# Patient Record
Sex: Female | Born: 1945 | Race: White | Hispanic: No | Smoking: Never smoker
Health system: Southern US, Community
[De-identification: ages and names within clinical notes are randomized; demographics above are authoritative.]

## PROBLEM LIST (undated history)

## (undated) DIAGNOSIS — Z923 Personal history of irradiation: Secondary | ICD-10-CM

## (undated) DIAGNOSIS — Z9221 Personal history of antineoplastic chemotherapy: Secondary | ICD-10-CM

## (undated) DIAGNOSIS — K449 Diaphragmatic hernia without obstruction or gangrene: Secondary | ICD-10-CM

## (undated) DIAGNOSIS — E119 Type 2 diabetes mellitus without complications: Secondary | ICD-10-CM

## (undated) DIAGNOSIS — K635 Polyp of colon: Secondary | ICD-10-CM

## (undated) DIAGNOSIS — M1990 Unspecified osteoarthritis, unspecified site: Secondary | ICD-10-CM

## (undated) DIAGNOSIS — G629 Polyneuropathy, unspecified: Secondary | ICD-10-CM

## (undated) DIAGNOSIS — I10 Essential (primary) hypertension: Secondary | ICD-10-CM

## (undated) DIAGNOSIS — R0600 Dyspnea, unspecified: Secondary | ICD-10-CM

## (undated) DIAGNOSIS — C50412 Malignant neoplasm of upper-outer quadrant of left female breast: Principal | ICD-10-CM

## (undated) DIAGNOSIS — Z8709 Personal history of other diseases of the respiratory system: Secondary | ICD-10-CM

## (undated) DIAGNOSIS — K279 Peptic ulcer, site unspecified, unspecified as acute or chronic, without hemorrhage or perforation: Secondary | ICD-10-CM

## (undated) DIAGNOSIS — K219 Gastro-esophageal reflux disease without esophagitis: Secondary | ICD-10-CM

## (undated) HISTORY — DX: Peptic ulcer, site unspecified, unspecified as acute or chronic, without hemorrhage or perforation: K27.9

## (undated) HISTORY — DX: Polyneuropathy, unspecified: G62.9

## (undated) HISTORY — DX: Essential (primary) hypertension: I10

## (undated) HISTORY — DX: Polyp of colon: K63.5

## (undated) HISTORY — PX: TUBAL LIGATION: SHX77

## (undated) HISTORY — DX: Type 2 diabetes mellitus without complications: E11.9

## (undated) HISTORY — DX: Malignant neoplasm of upper-outer quadrant of left female breast: C50.412

## (undated) HISTORY — DX: Diaphragmatic hernia without obstruction or gangrene: K44.9

## (undated) HISTORY — PX: FOOT SURGERY: SHX648

## (undated) HISTORY — PX: COLONOSCOPY: SHX174

---

## 1972-03-09 HISTORY — PX: APPENDECTOMY: SHX54

## 1993-03-09 HISTORY — PX: VAGINAL HYSTERECTOMY: SUR661

## 1994-03-09 HISTORY — PX: CHOLECYSTECTOMY: SHX55

## 1997-07-27 ENCOUNTER — Ambulatory Visit (HOSPITAL_COMMUNITY): Admission: RE | Admit: 1997-07-27 | Discharge: 1997-07-27 | Payer: Self-pay | Admitting: Gastroenterology

## 1997-08-28 ENCOUNTER — Other Ambulatory Visit: Admission: RE | Admit: 1997-08-28 | Discharge: 1997-08-28 | Payer: Self-pay | Admitting: Obstetrics & Gynecology

## 1998-03-09 HISTORY — PX: OTHER SURGICAL HISTORY: SHX169

## 1998-09-24 ENCOUNTER — Other Ambulatory Visit: Admission: RE | Admit: 1998-09-24 | Discharge: 1998-09-24 | Payer: Self-pay | Admitting: Obstetrics & Gynecology

## 1999-08-12 ENCOUNTER — Other Ambulatory Visit: Admission: RE | Admit: 1999-08-12 | Discharge: 1999-08-12 | Payer: Self-pay | Admitting: Podiatry

## 1999-12-02 ENCOUNTER — Other Ambulatory Visit: Admission: RE | Admit: 1999-12-02 | Discharge: 1999-12-02 | Payer: Self-pay | Admitting: Obstetrics & Gynecology

## 2000-02-10 ENCOUNTER — Encounter: Payer: Self-pay | Admitting: Neurology

## 2000-02-10 ENCOUNTER — Ambulatory Visit (HOSPITAL_COMMUNITY): Admission: RE | Admit: 2000-02-10 | Discharge: 2000-02-10 | Payer: Self-pay | Admitting: Neurology

## 2001-01-10 ENCOUNTER — Other Ambulatory Visit: Admission: RE | Admit: 2001-01-10 | Discharge: 2001-01-10 | Payer: Self-pay | Admitting: Obstetrics & Gynecology

## 2002-01-26 ENCOUNTER — Other Ambulatory Visit: Admission: RE | Admit: 2002-01-26 | Discharge: 2002-01-26 | Payer: Self-pay | Admitting: Obstetrics & Gynecology

## 2002-12-18 ENCOUNTER — Ambulatory Visit (HOSPITAL_COMMUNITY): Admission: RE | Admit: 2002-12-18 | Discharge: 2002-12-18 | Payer: Self-pay | Admitting: Gastroenterology

## 2002-12-18 ENCOUNTER — Encounter (INDEPENDENT_AMBULATORY_CARE_PROVIDER_SITE_OTHER): Payer: Self-pay | Admitting: *Deleted

## 2003-04-02 ENCOUNTER — Other Ambulatory Visit: Admission: RE | Admit: 2003-04-02 | Discharge: 2003-04-02 | Payer: Self-pay | Admitting: Obstetrics & Gynecology

## 2004-04-18 ENCOUNTER — Other Ambulatory Visit: Admission: RE | Admit: 2004-04-18 | Discharge: 2004-04-18 | Payer: Self-pay | Admitting: Obstetrics & Gynecology

## 2005-05-12 ENCOUNTER — Other Ambulatory Visit: Admission: RE | Admit: 2005-05-12 | Discharge: 2005-05-12 | Payer: Self-pay | Admitting: Obstetrics & Gynecology

## 2007-04-14 ENCOUNTER — Ambulatory Visit (HOSPITAL_COMMUNITY): Admission: RE | Admit: 2007-04-14 | Discharge: 2007-04-14 | Payer: Self-pay | Admitting: Family Medicine

## 2007-04-15 ENCOUNTER — Encounter (INDEPENDENT_AMBULATORY_CARE_PROVIDER_SITE_OTHER): Payer: Self-pay | Admitting: Family Medicine

## 2007-04-15 ENCOUNTER — Ambulatory Visit (HOSPITAL_COMMUNITY): Admission: RE | Admit: 2007-04-15 | Discharge: 2007-04-15 | Payer: Self-pay | Admitting: Family Medicine

## 2009-02-27 ENCOUNTER — Encounter: Admission: RE | Admit: 2009-02-27 | Discharge: 2009-02-27 | Payer: Self-pay | Admitting: Obstetrics & Gynecology

## 2010-07-25 NOTE — Op Note (Signed)
   NAME:  Kristine Parrish, BISCEGLIA                         ACCOUNT NO.:  [id number]   MEDICAL RECORD NO.:  [id number]                   PATIENT TYPE:  AMB   LOCATION:  ENDO                                 FACILITY:  [facility]   PHYSICIAN:  James L. Rolla Flatten., M.D.          DATE OF BIRTH:  1945/10/30   DATE OF PROCEDURE:  12/18/2002  DATE OF DISCHARGE:                                 OPERATIVE REPORT   PROCEDURE PERFORMED:  Colonoscopy and coagulation of polyps.   ENDOSCOPIST:  Joyice Faster. Edwards, M.D.   MEDICATIONS:  Fentanyl 100 mcg, Versed 2 mg IV.   INSTRUMENT USED:  Pediatric adjustable colonoscope.   INDICATIONS FOR PROCEDURE:  Previous history of adenomatous polyps.  This is  done as a follow-up colonoscopy.   DESCRIPTION OF PROCEDURE:  The procedure had been explained to the patient  and consent obtained.  With the patient in the left lateral decubitus  position, the pediatric adjustable scope was inserted and advanced.  The  prep was excellent and we were able to advance to the cecum without  difficulty.  The ileocecal valve and appendiceal orifice were seen.  The  scope was withdrawn and the cecum, ascending colon, transverse colon,  descending were seen well and no polyps were seen.  In the sigmoid colon at  35 cm up from the anal verge, a 2 to 3 mm sessile polyp was cauterized.  In  a similar fashion, a 2 to 3 mm rectal polyp was cauterized.  These were  placed in different jars.  No other polyps were seen.  The scope was  withdrawn.  The patient tolerated the procedure well.   ASSESSMENT:  Polyps removed from descending and sigmoid colon.   PLAN:  Routine post polypectomy instructions.  Will recommend repeating in  three years.                                               James L. Rolla Flatten., M.D.    [name]  D:  12/18/2002  T:  12/18/2002  Job:  [id number]   cc:   Orpah Melter, M.D.  [address] [place]  [place] [address]  Fax: [phone]

## 2011-05-28 ENCOUNTER — Other Ambulatory Visit: Payer: Self-pay | Admitting: Gastroenterology

## 2012-04-09 HISTORY — PX: OTHER SURGICAL HISTORY: SHX169

## 2013-03-15 ENCOUNTER — Encounter: Payer: Self-pay | Admitting: Neurology

## 2013-03-16 ENCOUNTER — Ambulatory Visit (INDEPENDENT_AMBULATORY_CARE_PROVIDER_SITE_OTHER): Payer: BC Managed Care – PPO | Admitting: Neurology

## 2013-03-16 ENCOUNTER — Encounter (INDEPENDENT_AMBULATORY_CARE_PROVIDER_SITE_OTHER): Payer: Self-pay

## 2013-03-16 ENCOUNTER — Encounter: Payer: Self-pay | Admitting: Neurology

## 2013-03-16 VITALS — BP 138/83 | HR 95 | Ht 65.0 in | Wt 174.0 lb

## 2013-03-16 DIAGNOSIS — E119 Type 2 diabetes mellitus without complications: Secondary | ICD-10-CM

## 2013-03-16 DIAGNOSIS — G609 Hereditary and idiopathic neuropathy, unspecified: Secondary | ICD-10-CM

## 2013-03-16 MED ORDER — PREGABALIN 75 MG PO CAPS: 75.0000 mg | ORAL_CAPSULE | Freq: Two times a day (BID) | ORAL | Status: DC

## 2013-03-16 MED ORDER — PREGABALIN 50 MG PO CAPS: 50.0000 mg | ORAL_CAPSULE | Freq: Two times a day (BID) | ORAL | Status: DC

## 2013-03-16 NOTE — Patient Instructions (Signed)
Overall you are doing fairly well but I do want to suggest a few things today:   Remember to drink plenty of fluid, eat healthy meals and do not skip any meals. Try to eat protein with a every meal and eat a healthy snack such as fruit or nuts in between meals. Try to keep a regular sleep-wake schedule and try to exercise daily, particularly in the form of walking, 20-30 minutes a day, if you can.   As far as your medications are concerned, I would like to suggest the following: 1)Stop the gabapentin 2)Try Lyrica 125mg  twice a day. Take a 75mg  capsule and a 50mg  capsule twice a day  As far as diagnostic testing:  1)Please have blood work completed today after you check out.   Please call our office in one month to let us know how the Lyrica is working. Please call us with any interim questions, concerns, problems, updates or refill requests.   Please also call us for any test results so we can go over those with you on the phone.  My clinical assistant and will answer any of your questions and relay your messages to me and also relay most of my messages to you.   Our phone number is [phone]. We also have an after hours call service for urgent matters and there is a physician on-call for urgent questions. For any emergencies you know to call 911 or go to the nearest emergency room

## 2013-03-16 NOTE — Progress Notes (Signed)
[facility]    Provider:  Dr Janann Colonel Referring Provider: Orpah Melter, MD Primary Care Physician:  Orpah Melter, MD  CC:  Peripheral neuropathy  HPI:  Kristine Parrish is a 68 y.o. female here as a referral from Dr. Olen Pel for evaluation of peripheral neuropathy  Has history of DM, Has tried gabapentin, lyrica and capsaicin cream in the past. Pain has been getting worse and worse, starting to get unbearable on certain days. Symptoms started around 15years ago, getting progressively worse. Located bilateral feet, the whole foot,moving more proximal, currently mid-shin. Described as a burning, stabbing pain. Currently taking gabapentin 400mg  qid, minimal benefit. Can be worse with walking around, anything that touches her feet. Worse in the evening and at night time.   States that her DM is well controlled on no medications. Otherwise pretty healthy. She does report having had a EMG nerve conduction study in the past which showed no signs of neuropathy per the patient.  Review of Systems: Out of a complete 14 system review, the patient complains of only the following symptoms, and all other reviewed systems are negative. Positive swelling in legs joint pain numbness  History   Social History  . Marital Status: Widowed    Spouse Name: N/A    Number of Children: 2  . Years of Education: 12   Occupational History  . Not on file.   Social History Main Topics  . Smoking status: Never Smoker   . Smokeless tobacco: Never Used  . Alcohol Use: No  . Drug Use: No  . Sexual Activity: Not on file   Other Topics Concern  . Not on file   Social History Narrative   Patient is divorced.   Patient has two children   Patient works in a clerical position.   Patient has a high school education.   Patient drinks caffeine rarely.    Family History  Problem Relation Age of Onset  . Diabetes Mother   . Hypertension Mother   . Alcoholism Maternal Grandfather   .  Congestive Heart Failure Maternal Grandmother     Past Medical History  Diagnosis Date  . Type 2 diabetes mellitus   . Peripheral neuropathy   . Peptic ulcer disease   . Hiatal hernia   . Colon polyps     Past Surgical History  Procedure Laterality Date  . Vaginal hysterectomy  1995  . Cholecystectomy  1996  . Nerve removed from left foot  2000  . Appendectomy  1974  . Cataract surgery  04/2012    Current Outpatient Prescriptions  Medication Sig Dispense Refill  . aspirin 81 MG tablet Take 81 mg by mouth daily.      . Calcium Carb-Cholecalciferol (CALCIUM + D3) 600-200 MG-UNIT TABS Take by mouth.      . estrogens, conjugated, (PREMARIN) 0.625 MG tablet Take 0.625 mg by mouth daily. Take daily for 21 days then do not take for 7 days.      Marland Kitchen gabapentin (NEURONTIN) 400 MG capsule Take 400 mg by mouth 4 (four) times daily.      Marland Kitchen glucose blood (FREESTYLE LITE) test strip 1 each by Other route as needed for other. Use as instructed      . hydrochlorothiazide (MICROZIDE) 12.5 MG capsule Take 12.5 mg by mouth daily.      Marland Kitchen lovastatin (MEVACOR) 20 MG tablet Take 20 mg by mouth at bedtime.      . Multiple Vitamin (MULTIVITAMIN) tablet Take 1 tablet by mouth  daily.      . omeprazole (PRILOSEC) 20 MG capsule Take 20 mg by mouth daily.      . Probiotic Product (PROBIOTIC DAILY PO) Take by mouth.       No current facility-administered medications for this visit.    Allergies as of 03/16/2013  . (No Known Allergies)    Vitals: BP 138/83  Pulse 95  Ht 5\' 5"  (1.651 m)  Wt 174 lb (78.926 kg)  BMI 28.96 kg/m2 Last Weight:  Wt Readings from Last 1 Encounters:  03/16/13 174 lb (78.926 kg)   Last Height:   Ht Readings from Last 1 Encounters:  03/16/13 5\' 5"  (1.651 m)     Physical exam: Exam: Gen: NAD, conversant Eyes: anicteric sclerae, moist conjunctivae HENT: Atraumatic, oropharynx clear Neck: Trachea midline; supple,  Lungs: CTA, no wheezing, rales, rhonic                           CV: RRR, no MRG Abdomen: Soft, non-tender;  Extremities: No peripheral edema  Skin: Normal temperature, no rash,  Psych: Appropriate affect, pleasant  Neuro: MS: AA&Ox3, appropriately interactive, normal affect   Speech: fluent w/o paraphasic error  Memory: good recent and remote recall  CN: PERRL, EOMI no nystagmus, no ptosis, sensation intact to LT V1-V3 bilat, face symmetric, no weakness, hearing grossly intact, palate elevates symmetrically, shoulder shrug 5/5 bilat,  tongue protrudes midline, no fasiculations noted.  Motor: normal bulk and tone Strength: 5/5  In all extremities  Coord: rapid alternating and point-to-point (FNF, HTS) movements intact.  Reflexes: symmetrical with exception of absent achilles bilat, bilat downgoing toes  Sens: LT intact in bilateral UE. Diminished LT, PP, temp, vibration and proprioception. Diminished to mid-shin level bilaterally  Gait: posture, stance, stride and arm-swing normal. Tandem gait intact. Able to walk on heels and toes. Romberg absent.   Assessment:  After physical and neurologic examination, review of laboratory studies, imaging, neurophysiology testing and pre-existing records, assessment will be reviewed on the problem list.  Plan:  Treatment plan and additional workup will be reviewed under Problem List.  1)Peripheral neuropathy 45)DM  68 year old woman presenting for initial evaluation of possible peripheral neuropathy. Symptoms have been getting progressively worse the past 15 years, has not responded to gabapentin or capsaicin cream. Tried low dose of Lyrica but did not get up to therapeutic dose. Physical exam is pertinent for decreased sensation in all modalities to midshin bilaterally, otherwise unremarkable physical exam. Her findings are most consistent with a peripheral neuropathy, unclear etiology at this point. Will check lab work. Will not repeat EMG nerve conduction study at this time as it will likely  not alter the course of treatment, can consider in the future if warranted. Will try patient on the higher dose of Lyrica, 125 mg twice a day. Will try this for 4 weeks if no symptomatic benefit can consider trial of Horizant. Other options in the future to consider include compounding cream, Horizant and an antidepressant. Patient instructed to call office in 4 weeks to discuss results of Lyrica.   Jim Like, DO  [facility] [address] [place], [place] [address]  Phone [phone] Fax [phone]

## 2013-03-20 ENCOUNTER — Telehealth: Payer: Self-pay | Admitting: Neurology

## 2013-03-20 ENCOUNTER — Other Ambulatory Visit: Payer: Self-pay | Admitting: Neurology

## 2013-03-20 LAB — PROTEIN ELECTROPHORESIS
A/G Ratio: 1.1 (ref 0.7–2.0)
Albumin ELP: 3.6 g/dL (ref 3.2–5.6)
Alpha 1: 0.3 g/dL (ref 0.1–0.4)
Alpha 2: 0.9 g/dL (ref 0.4–1.2)
Beta: 1 g/dL (ref 0.6–1.3)
Gamma Globulin: 1.1 g/dL (ref 0.5–1.6)
Globulin, Total: 3.4 g/dL (ref 2.0–4.5)
Total Protein: 7 g/dL (ref 6.0–8.5)

## 2013-03-20 LAB — TSH: TSH: 2.62 u[IU]/mL (ref 0.450–4.500)

## 2013-03-20 LAB — VITAMIN B12: Vitamin B-12: 404 pg/mL (ref 211–946)

## 2013-03-20 LAB — METHYLMALONIC ACID, SERUM: Methylmalonic Acid: 161 nmol/L (ref 0–378)

## 2013-03-20 LAB — HGB A1C W/O EAG: Hgb A1c MFr Bld: 7.6 % — ABNORMAL HIGH (ref 4.8–5.6)

## 2013-03-20 MED ORDER — GABAPENTIN 600 MG PO TABS: 600.0000 mg | ORAL_TABLET | Freq: Three times a day (TID) | ORAL | Status: DC

## 2013-03-20 NOTE — Telephone Encounter (Signed)
Called patient to discuss lab work results. Counseled her that her hemoglobin A1c was elevated and that poorly controlled DM can contribute to painful neuropathy. Instructed her to followup with her primary care doctor for further optimization of her diabetes. She notes no benefit with the Lyrica. Will switch her back to gabapentin since she did have some benefit on that, will increase her dosage was only taking 1600 mg daily. Will start her on 600 mg 3 times a day, can titrate up in the future as needed.

## 2013-10-20 ENCOUNTER — Other Ambulatory Visit: Payer: Self-pay | Admitting: Neurology

## 2014-10-03 ENCOUNTER — Other Ambulatory Visit: Payer: Self-pay | Admitting: Obstetrics & Gynecology

## 2014-10-04 LAB — CYTOLOGY - PAP

## 2015-06-27 DIAGNOSIS — M2011 Hallux valgus (acquired), right foot: Secondary | ICD-10-CM | POA: Diagnosis not present

## 2015-06-27 DIAGNOSIS — E119 Type 2 diabetes mellitus without complications: Secondary | ICD-10-CM | POA: Diagnosis not present

## 2015-08-06 ENCOUNTER — Other Ambulatory Visit: Payer: Self-pay | Admitting: Obstetrics & Gynecology

## 2015-08-06 DIAGNOSIS — N6320 Unspecified lump in the left breast, unspecified quadrant: Principal | ICD-10-CM

## 2015-08-06 DIAGNOSIS — N6325 Unspecified lump in the left breast, overlapping quadrants: Secondary | ICD-10-CM

## 2015-08-06 DIAGNOSIS — N63 Unspecified lump in breast: Secondary | ICD-10-CM | POA: Diagnosis not present

## 2015-08-08 DIAGNOSIS — M84374K Stress fracture, right foot, subsequent encounter for fracture with nonunion: Secondary | ICD-10-CM | POA: Diagnosis not present

## 2015-08-08 DIAGNOSIS — M79605 Pain in left leg: Secondary | ICD-10-CM | POA: Diagnosis not present

## 2015-08-08 DIAGNOSIS — E119 Type 2 diabetes mellitus without complications: Secondary | ICD-10-CM | POA: Diagnosis not present

## 2015-08-09 ENCOUNTER — Other Ambulatory Visit: Payer: Self-pay | Admitting: Obstetrics & Gynecology

## 2015-08-09 ENCOUNTER — Ambulatory Visit
Admission: RE | Admit: 2015-08-09 | Discharge: 2015-08-09 | Disposition: A | Discharge status: Home / Self Care | Payer: BLUE CROSS/BLUE SHIELD | Source: Ambulatory Visit | Attending: Obstetrics & Gynecology | Admitting: Obstetrics & Gynecology

## 2015-08-09 DIAGNOSIS — N6320 Unspecified lump in the left breast, unspecified quadrant: Principal | ICD-10-CM

## 2015-08-09 DIAGNOSIS — N6325 Unspecified lump in the left breast, overlapping quadrants: Secondary | ICD-10-CM

## 2015-08-09 DIAGNOSIS — N63 Unspecified lump in breast: Secondary | ICD-10-CM | POA: Diagnosis not present

## 2015-08-09 DIAGNOSIS — R922 Inconclusive mammogram: Secondary | ICD-10-CM | POA: Diagnosis not present

## 2015-08-13 ENCOUNTER — Ambulatory Visit
Admission: RE | Admit: 2015-08-13 | Discharge: 2015-08-13 | Disposition: A | Discharge status: Home / Self Care | Payer: BLUE CROSS/BLUE SHIELD | Source: Ambulatory Visit | Attending: Obstetrics & Gynecology | Admitting: Obstetrics & Gynecology

## 2015-08-13 DIAGNOSIS — N6320 Unspecified lump in the left breast, unspecified quadrant: Principal | ICD-10-CM

## 2015-08-13 DIAGNOSIS — C50412 Malignant neoplasm of upper-outer quadrant of left female breast: Secondary | ICD-10-CM | POA: Diagnosis not present

## 2015-08-13 DIAGNOSIS — N6325 Unspecified lump in the left breast, overlapping quadrants: Secondary | ICD-10-CM

## 2015-08-13 DIAGNOSIS — C773 Secondary and unspecified malignant neoplasm of axilla and upper limb lymph nodes: Secondary | ICD-10-CM | POA: Diagnosis not present

## 2015-08-13 DIAGNOSIS — N63 Unspecified lump in breast: Secondary | ICD-10-CM | POA: Diagnosis not present

## 2015-08-15 ENCOUNTER — Telehealth: Payer: Self-pay | Admitting: *Deleted

## 2015-08-15 ENCOUNTER — Encounter: Payer: Self-pay | Admitting: *Deleted

## 2015-08-15 DIAGNOSIS — Z170 Estrogen receptor positive status [ER+]: Secondary | ICD-10-CM | POA: Insufficient documentation

## 2015-08-15 DIAGNOSIS — C50412 Malignant neoplasm of upper-outer quadrant of left female breast: Secondary | ICD-10-CM

## 2015-08-15 HISTORY — DX: Malignant neoplasm of upper-outer quadrant of left female breast: C50.412

## 2015-08-15 NOTE — Telephone Encounter (Signed)
Confirmed [facility] for 08/21/15 at 1215 .  Instructions and contact information given.

## 2015-08-15 NOTE — Telephone Encounter (Signed)
Left vm for pt to return call regarding [place] on 08/21/15

## 2015-08-16 ENCOUNTER — Telehealth: Payer: Self-pay | Admitting: *Deleted

## 2015-08-16 NOTE — Telephone Encounter (Signed)
Mailed clinic packet to pt.  

## 2015-08-21 ENCOUNTER — Telehealth: Payer: Self-pay | Admitting: Oncology

## 2015-08-21 ENCOUNTER — Encounter: Payer: Self-pay | Admitting: Oncology

## 2015-08-21 ENCOUNTER — Other Ambulatory Visit (HOSPITAL_BASED_OUTPATIENT_CLINIC_OR_DEPARTMENT_OTHER): Payer: BLUE CROSS/BLUE SHIELD

## 2015-08-21 ENCOUNTER — Encounter: Payer: Self-pay | Admitting: *Deleted

## 2015-08-21 ENCOUNTER — Ambulatory Visit: Payer: BLUE CROSS/BLUE SHIELD | Attending: General Surgery | Admitting: Physical Therapy

## 2015-08-21 ENCOUNTER — Other Ambulatory Visit: Payer: Self-pay | Admitting: General Surgery

## 2015-08-21 ENCOUNTER — Encounter: Payer: Self-pay | Admitting: General Practice

## 2015-08-21 ENCOUNTER — Encounter: Payer: Self-pay | Admitting: Physical Therapy

## 2015-08-21 ENCOUNTER — Ambulatory Visit (HOSPITAL_BASED_OUTPATIENT_CLINIC_OR_DEPARTMENT_OTHER): Payer: BLUE CROSS/BLUE SHIELD | Admitting: Oncology

## 2015-08-21 ENCOUNTER — Ambulatory Visit
Admission: RE | Admit: 2015-08-21 | Discharge: 2015-08-21 | Disposition: A | Discharge status: Home / Self Care | Payer: BLUE CROSS/BLUE SHIELD | Source: Ambulatory Visit | Attending: Radiation Oncology | Admitting: Radiation Oncology

## 2015-08-21 VITALS — BP 153/74 | HR 90 | Temp 98.3°F | Resp 20 | Ht 65.0 in | Wt 170.0 lb

## 2015-08-21 DIAGNOSIS — Z170 Estrogen receptor positive status [ER+]: Secondary | ICD-10-CM

## 2015-08-21 DIAGNOSIS — C50212 Malignant neoplasm of upper-inner quadrant of left female breast: Secondary | ICD-10-CM | POA: Diagnosis not present

## 2015-08-21 DIAGNOSIS — R262 Difficulty in walking, not elsewhere classified: Secondary | ICD-10-CM | POA: Insufficient documentation

## 2015-08-21 DIAGNOSIS — R293 Abnormal posture: Secondary | ICD-10-CM | POA: Diagnosis not present

## 2015-08-21 DIAGNOSIS — C50412 Malignant neoplasm of upper-outer quadrant of left female breast: Secondary | ICD-10-CM | POA: Diagnosis not present

## 2015-08-21 DIAGNOSIS — C773 Secondary and unspecified malignant neoplasm of axilla and upper limb lymph nodes: Secondary | ICD-10-CM | POA: Diagnosis not present

## 2015-08-21 LAB — COMPREHENSIVE METABOLIC PANEL
ALT: 29 U/L (ref 0–55)
AST: 24 U/L (ref 5–34)
Albumin: 3.4 g/dL — ABNORMAL LOW (ref 3.5–5.0)
Alkaline Phosphatase: 148 U/L (ref 40–150)
Anion Gap: 12 mEq/L — ABNORMAL HIGH (ref 3–11)
BUN: 8.4 mg/dL (ref 7.0–26.0)
CO2: 24 mEq/L (ref 22–29)
Calcium: 9 mg/dL (ref 8.4–10.4)
Chloride: 102 mEq/L (ref 98–109)
Creatinine: 0.9 mg/dL (ref 0.6–1.1)
EGFR: 69 mL/min/{1.73_m2} — ABNORMAL LOW (ref >90)
Glucose: 182 mg/dl — ABNORMAL HIGH (ref 70–140)
Potassium: 3.1 mEq/L — ABNORMAL LOW (ref 3.5–5.1)
Sodium: 138 mEq/L (ref 136–145)
Total Bilirubin: 0.35 mg/dL (ref 0.20–1.20)
Total Protein: 7.3 g/dL (ref 6.4–8.3)

## 2015-08-21 LAB — CBC WITH DIFFERENTIAL/PLATELET
BASO%: 0.6 % (ref 0.0–2.0)
Basophils Absolute: 0.1 10*3/uL (ref 0.0–0.1)
EOS%: 5.4 % (ref 0.0–7.0)
Eosinophils Absolute: 0.5 10*3/uL (ref 0.0–0.5)
HCT: 37.5 % (ref 34.8–46.6)
HGB: 12.4 g/dL (ref 11.6–15.9)
LYMPH%: 28.4 % (ref 14.0–49.7)
MCH: 28 pg (ref 25.1–34.0)
MCHC: 33.2 g/dL (ref 31.5–36.0)
MCV: 84.3 fL (ref 79.5–101.0)
MONO#: 0.7 10*3/uL (ref 0.1–0.9)
MONO%: 6.9 % (ref 0.0–14.0)
NEUT#: 5.6 10*3/uL (ref 1.5–6.5)
NEUT%: 58.7 % (ref 38.4–76.8)
Platelets: 274 10*3/uL (ref 145–400)
RBC: 4.44 10*6/uL (ref 3.70–5.45)
RDW: 14.1 % (ref 11.2–14.5)
WBC: 9.6 10*3/uL (ref 3.9–10.3)
lymph#: 2.7 10*3/uL (ref 0.9–3.3)

## 2015-08-21 MED ORDER — VENLAFAXINE HCL ER 37.5 MG PO CP24: 37.5000 mg | ORAL_CAPSULE | Freq: Every day | ORAL | Status: DC

## 2015-08-21 NOTE — Progress Notes (Signed)
Radiation Oncology         [phone] ________________________________  Initial outpatient Consultation  Name: Kristine Parrish MRN: [id number]  Date: 08/21/2015  DOB: [birth year]  TX:MIWOEH, Annie Main, MD  Jovita Kussmaul, MD   REFERRING PHYSICIAN: Jovita Kussmaul, MD  DIAGNOSIS:    ICD-9-CM ICD-10-CM   1. 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 female who presented with a palpable left breast mass for months.  On [place] this was 2.7cm in the 2:00 position of the left breast and there was a suspicious 99m left axillary node. Biopsy of the mass showed invasive ductal carcinoma with characteristics as described above in the diagnosis.  Axillary node biopsy was positive for metastatic carcinoma.    She was on Premarin but plans to stop this now.  She reports diabetic foot pain.  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  PREVIOUS RADIATION THERAPY: No  PAST MEDICAL HISTORY:  has a past medical history of Type 2 diabetes mellitus ([place]; Peripheral neuropathy ([place]; Peptic ulcer disease; Hiatal hernia; Colon polyps; Breast cancer of upper-outer quadrant of left female breast ([place] (08/15/2015); and Hypertension.    PAST SURGICAL HISTORY: Past Surgical History  Procedure Laterality Date  . Vaginal hysterectomy  1995  . Cholecystectomy  1996  . Nerve removed from left foot  2000  . Appendectomy  1974  . Cataract surgery  04/2012  . Tubal ligation      FAMILY HISTORY: family history includes Alcoholism in her maternal grandfather; Congestive Heart Failure in her maternal grandmother; Diabetes in her mother; Hypertension in her mother.  SOCIAL HISTORY:  reports that she has never smoked. She has never used smokeless tobacco. She reports that she does not drink alcohol or use illicit  drugs.  ALLERGIES: Review of patient's allergies indicates no known allergies.  MEDICATIONS:  Current Outpatient Prescriptions  Medication Sig Dispense Refill  . aspirin 81 MG tablet Take 81 mg by mouth daily.    . Calcium Carb-Cholecalciferol (CALCIUM + D3) 600-200 MG-UNIT TABS Take by mouth.    . estrogens, conjugated, (PREMARIN) 0.625 MG tablet Take 0.625 mg by mouth daily. Take daily for 21 days then do not take for 7 days.    .Marland Kitchengabapentin (NEURONTIN) 600 MG tablet TAKE ONE TABLET BY MOUTH THREE TIMES DAILY  90 tablet 5  . glucose blood (FREESTYLE LITE) test strip 1 each by Other route as needed for other. Use as instructed    . hydrochlorothiazide (MICROZIDE) 12.5 MG capsule Take 12.5 mg by mouth daily.    .Marland Kitchenlovastatin (MEVACOR) 20 MG tablet Take 20 mg by mouth at bedtime.    . Multiple Vitamin (MULTIVITAMIN) tablet Take 1 tablet by mouth daily.    .Marland Kitchenomeprazole (PRILOSEC) 20 MG capsule Take 20 mg by mouth daily.    . Probiotic Product (PROBIOTIC DAILY PO) Take by mouth. Reported on 08/21/2015    . venlafaxine XR (EFFEXOR-XR) 37.5 MG 24 hr capsule Take 1 capsule (37.5 mg total) by mouth daily with breakfast. 30 capsule 4   No current facility-administered medications for this encounter.    REVIEW OF SYSTEMS:  Notable for that above. Complete 15 point review of systems reviewed from nursing intake sheet.   PHYSICAL EXAM:    Vitals with BMI 08/21/2015  Height _0   Weight 170 lbs  BMI 42.8  Systolic 768  Diastolic 74  Pulse 90  Respirations 20   General: Alert and oriented, in no acute distress HEENT: Head is normocephalic. Extraocular movements are intact. Oropharynx is clear. Neck: Neck is supple, no palpable cervical or supraclavicular lymphadenopathy. Heart: Regular in rate and rhythm with no murmurs, rubs, or gallops. Chest: Clear to auscultation bilaterally, with no rhonchi, wheezes, or rales. Abdomen: Soft, nontender, nondistended, with no rigidity or  guarding. Extremities: No cyanosis or edema. Lymphatics: see Neck Exam Skin: No concerning lesions. Musculoskeletal: symmetric strength and muscle tone throughout. Neurologic: Cranial nerves II through XII are grossly intact. No obvious focalities. Speech is fluent. Coordination is intact. Psychiatric: Judgment and insight are intact. Affect is appropriate. Breasts: palpable approx 4cm mass in UOQ of left breast with post biopsy changes . No other palpable masses appreciated in the breasts or axillae appreciated bilaterally .  ECOG = 0  0 - Asymptomatic (Fully active, able to carry on all predisease activities without restriction)  1 - Symptomatic but completely ambulatory (Restricted in physically strenuous activity but ambulatory and able to carry out work of a light or sedentary nature. For example, light housework, office work)  2 - Symptomatic, <50% in bed during the day (Ambulatory and capable of all self care but unable to carry out any work activities. Up and about more than 50% of waking hours)  3 - Symptomatic, >50% in bed, but not bedbound (Capable of only limited self-care, confined to bed or chair 50% or more of waking hours)  4 - Bedbound (Completely disabled. Cannot carry on any self-care. Totally confined to bed or chair)  5 - Death   Eustace Pen MM, Creech RH, Tormey DC, et al. [phone]). "Toxicity and response criteria of the [facility]". Danielson Oncol. 5 (6): 649-55   LABORATORY DATA:  Lab Results  Component Value Date   WBC 9.6 08/21/2015   HGB 12.4 08/21/2015   HCT 37.5 08/21/2015   MCV 84.3 08/21/2015   PLT 274 08/21/2015   CMP     Component Value Date/Time   NA 138 08/21/2015 1220   K 3.1* 08/21/2015 1220   CO2 24 08/21/2015 1220   GLUCOSE 182* 08/21/2015 1220   BUN 8.4 08/21/2015 1220   CREATININE 0.9 08/21/2015 1220   CALCIUM 9.0 08/21/2015 1220   PROT 7.3 08/21/2015 1220   PROT 7.0 03/16/2013 0831   ALBUMIN 3.4* 08/21/2015  1220   AST 24 08/21/2015 1220   ALT 29 08/21/2015 1220   ALKPHOS 148 08/21/2015 1220   BILITOT 0.35 08/21/2015 1220         RADIOGRAPHY: Mm Digital Diagnostic Unilat L  08/13/2015  CLINICAL DATA:  Post left breast ultrasound-guided core biopsy. EXAM: DIAGNOSTIC LEFT MAMMOGRAM POST ULTRASOUND BIOPSY COMPARISON:  Previous exam(s). FINDINGS: Mammographic images were obtained following ultrasound guided biopsy of the palpable mass located within the left breast at the 2 o'clock position and the left axillary lymph node with focal cortical thickening. The ribbon shaped clip is in appropriate position associated with the left breast mass located at the 2 o'clock position. The spiral shaped HydroMARK clip is in appropriate position associated with the mildly prominent left axillary lymph node as visualized in the left MLO projection. IMPRESSION: Appropriate positioning of clips following left breast ultrasound-guided biopsies as discussed above. Final Assessment: Post Procedure Mammograms for Marker Placement Electronically Signed   By: Altamese Cabal M.D.   On: 08/13/2015 13:38  US Breast Ltd Uni Left Inc Axilla  08/13/2015  ADDENDUM REPORT: 08/13/2015 17:05 ADDENDUM: In the findings of the report, the 5th paragraph, 1st sentence should read: Targeted left breast ultrasound is performed showing a hypoechoic mass with irregular margins demonstrating equal acoustic characteristics at the 2 O'CLOCK POSITION approximately 3 cm from the nipple. Electronically Signed   By: Evangeline Dakin M.D.   On: 08/13/2015 17:05  08/13/2015  CLINICAL DATA:  70 year old with a palpable lump in the outer periareolar left breast, confirmed on recent clinical breast examination. Annual evaluation, right breast. EXAM: 2D DIGITAL DIAGNOSTIC BILATERAL MAMMOGRAM WITH CAD AND ADJUNCT TOMO ULTRASOUND LEFT BREAST COMPARISON:  Mammography 10/02/2014, 09/25/2013 and earlier. No prior ultrasound. ACR Breast Density Category d: The breast  tissue is extremely dense, which lowers the sensitivity of mammography. FINDINGS: Standard 2D and tomosynthesis CC and MLO views of both breasts and a standard 2D spot tangential view of the area palpable concern in the left breast were obtained. Corresponding to the area of palpable concern is a mass with irregular margins, associated with architectural distortion and scattered microcalcifications in the outer left breast, anterior depth. The mass measures approximately 3 cm maximally. No findings suspicious for malignancy in the right breast. Mammographic images were processed with CAD. On physical exam, there is a firm palpable 2-3 cm mass in the upper outer periareolar left breast. Targeted left breast ultrasound is performed, showing a hypoechoic mass with irregular margins demonstrating equal acoustic characteristics at the 10 o'clock position approximately 3 cm from the nipple measuring approximately 2.6 x 1.9 x 2.7 cm, demonstrating internal power Doppler flow. This corresponds to the area of palpable concern and the mammographic abnormality. Sonographic evaluation of the left axilla demonstrates a solitary level 1 low left axillary node with focal cortical thickening up to 4 mm. IMPRESSION: 1. Highly suspicious approximate 2.7 cm mass in the upper outer periareolar left breast accounting for the area of palpable concern. 2. Focal cortical thickening involving a level 1 low left axillary lymph node. 3. No mammographic evidence of malignancy, right breast. RECOMMENDATION: Ultrasound-guided core needle biopsy of the left breast mass and the left axillary lymph node with focal cortical thickening. The ultrasound biopsy procedure was discussed with the patient and her questions were answered. She has agreed to proceed and this has been scheduled for Tuesday, June 6 at 11:30 a.m. I have discussed the findings and recommendations with the patient. Results were also provided in writing at the conclusion of the  visit. If applicable, a reminder letter will be sent to the patient regarding the next appointment. BI-RADS CATEGORY  5: Highly suggestive of malignancy. Electronically Signed: By: Evangeline Dakin M.D. On: 08/09/2015 15:14   Mm Diag Breast Tomo Bilateral  08/13/2015  ADDENDUM REPORT: 08/13/2015 17:05 ADDENDUM: In the findings of the report, the 5th paragraph, 1st sentence should read: Targeted left breast ultrasound is performed showing a hypoechoic mass with irregular margins demonstrating equal acoustic characteristics at the 2 O'CLOCK POSITION approximately 3 cm from the nipple. Electronically Signed   By: Evangeline Dakin M.D.   On: 08/13/2015 17:05  08/13/2015  CLINICAL DATA:  70 year old with a palpable lump in the outer periareolar left breast, confirmed on recent clinical breast examination. Annual evaluation, right breast. EXAM: 2D DIGITAL DIAGNOSTIC BILATERAL MAMMOGRAM WITH CAD AND ADJUNCT TOMO ULTRASOUND LEFT BREAST COMPARISON:  Mammography 10/02/2014, 09/25/2013 and earlier. No prior ultrasound. ACR Breast Density Category d: The breast tissue is extremely dense, which lowers the sensitivity of mammography.  FINDINGS: Standard 2D and tomosynthesis CC and MLO views of both breasts and a standard 2D spot tangential view of the area palpable concern in the left breast were obtained. Corresponding to the area of palpable concern is a mass with irregular margins, associated with architectural distortion and scattered microcalcifications in the outer left breast, anterior depth. The mass measures approximately 3 cm maximally. No findings suspicious for malignancy in the right breast. Mammographic images were processed with CAD. On physical exam, there is a firm palpable 2-3 cm mass in the upper outer periareolar left breast. Targeted left breast ultrasound is performed, showing a hypoechoic mass with irregular margins demonstrating equal acoustic characteristics at the 10 o'clock position approximately 3 cm  from the nipple measuring approximately 2.6 x 1.9 x 2.7 cm, demonstrating internal power Doppler flow. This corresponds to the area of palpable concern and the mammographic abnormality. Sonographic evaluation of the left axilla demonstrates a solitary level 1 low left axillary node with focal cortical thickening up to 4 mm. IMPRESSION: 1. Highly suspicious approximate 2.7 cm mass in the upper outer periareolar left breast accounting for the area of palpable concern. 2. Focal cortical thickening involving a level 1 low left axillary lymph node. 3. No mammographic evidence of malignancy, right breast. RECOMMENDATION: Ultrasound-guided core needle biopsy of the left breast mass and the left axillary lymph node with focal cortical thickening. The ultrasound biopsy procedure was discussed with the patient and her questions were answered. She has agreed to proceed and this has been scheduled for Tuesday, June 6 at 11:30 a.m. I have discussed the findings and recommendations with the patient. Results were also provided in writing at the conclusion of the visit. If applicable, a reminder letter will be sent to the patient regarding the next appointment. BI-RADS CATEGORY  5: Highly suggestive of malignancy. Electronically Signed: By: Evangeline Dakin M.D. On: 08/09/2015 15:14   [place] Lt Breast Bx W Loc Dev 1st Lesion Img Bx Spec US Guide  08/16/2015  ADDENDUM REPORT: 08/14/2015 16:01 ADDENDUM: Pathology revealed grade II to III invasive ductal carcinoma in the left breast. The left axillary lymph node was positive for metastatic ductal carcinoma. This was found to be concordant by Dr. Luberta Robertson. Pathology results were discussed with the patient by telephone. The patient reported doing well after the biopsy with tenderness at the site. Post biopsy instructions and care were reviewed and questions were answered. The patient was encouraged to call [facility] for any additional concerns. The  patient was referred to the [facility] at the [facility] on August 21, 2015. Pathology results reported by Susa Raring RN, BSN on 08/14/2015. Electronically Signed   By: Altamese Cabal M.D.   On: 08/14/2015 16:01  08/16/2015  CLINICAL DATA:  Left breast mass with mildly prominent left axillary lymph node. EXAM: ULTRASOUND GUIDED LEFT BREAST CORE NEEDLE BIOPSY COMPARISON:  Previous exam(s). FINDINGS: I met with the patient and we discussed the procedure of ultrasound-guided biopsy, including benefits and alternatives. We discussed the high likelihood of a successful procedure. We discussed the risks of the procedure, including infection, bleeding, tissue injury, clip migration, and inadequate sampling. Informed written consent was given. The usual time-out protocol was performed immediately prior to the procedure. Using sterile technique and 1% Lidocaine as local anesthetic, under direct ultrasound visualization, a 14 gauge spring-loaded device was used to perform biopsy of the palpable mass located within the left breast at the 2 o'clock  position using a lateral approach. At the conclusion of the procedure a ribbon shaped tissue marker clip was deployed into the biopsy cavity. Follow up 2 view mammogram was performed and dictated separately. IMPRESSION: Ultrasound guided biopsy of the palpable mass located within the left breast at the 2 o'clock position as discussed above. No apparent complications. Electronically Signed: By: Altamese Cabal M.D. On: 08/13/2015 13:34   [place] Lt Breast Bx W Loc Dev Ea Add Lesion Img Bx Spec US Guide  08/16/2015  ADDENDUM REPORT: 08/14/2015 16:02 ADDENDUM: Pathology revealed grade II to III invasive ductal carcinoma in the left breast. The left axillary lymph node was positive for metastatic ductal carcinoma. This was found to be concordant by Dr. Luberta Robertson. Pathology results were discussed with the patient by telephone. The  patient reported doing well after the biopsy with tenderness at the site. Post biopsy instructions and care were reviewed and questions were answered. The patient was encouraged to call [facility] for any additional concerns. The patient was referred to the [facility] at the [facility] on August 21, 2015. Pathology results reported by Susa Raring RN, BSN on 08/14/2015. Electronically Signed   By: Altamese Cabal M.D.   On: 08/14/2015 16:02  08/16/2015  CLINICAL DATA:  Palpable left breast mass and left axillary lymph node with focal cortical thickening. EXAM: ULTRASOUND GUIDED LEFT BREAST CORE NEEDLE BIOPSY COMPARISON:  Previous exam(s). FINDINGS: I met with the patient and we discussed the procedure of ultrasound-guided biopsy, including benefits and alternatives. We discussed the high likelihood of a successful procedure. We discussed the risks of the procedure, including infection, bleeding, tissue injury, clip migration, and inadequate sampling. Informed written consent was given. The usual time-out protocol was performed immediately prior to the procedure. Using sterile technique and 1% Lidocaine as local anesthetic, under direct ultrasound visualization, a 14 gauge spring-loaded device was used to perform biopsy of the left axillary lymph node with focal cortical thickening using a lateral approach. At the conclusion of the procedure a HydroMARK tissue marker clip was deployed into the biopsy cavity. Follow up 2 view mammogram was performed and dictated separately. IMPRESSION: Ultrasound guided biopsy of the left axillary lymph node with mild focal cortical thickening as discussed above. No apparent complications. Electronically Signed: By: Altamese Cabal M.D. On: 08/13/2015 13:36      IMPRESSION/PLAN: Stage IIB left breast cancer  It was a pleasure meeting the patient today. We discussed the risks, benefits, and side effects  of radiotherapy. I recommend radiotherapy to the left breast and regional nodes to reduce her risk of locoregional recurrence by 2/3.  We discussed that radiation would take approximately 6 weeks to complete and that I would give the patient a few weeks to heal following surgery before starting treatment planning.   We spoke about acute effects including skin irritation and fatigue as well as much less common late effects including internal organ injury or irritation. We spoke about the latest technology that is used to minimize the risk of late effects for patients undergoing radiotherapy to the breast or chest wall. No guarantees of treatment were given. The patient is enthusiastic about proceeding with treatment. I look forward to participating in the patient's care.  Plan for MRI of breasts, neoadjuvant chemotherapy, and breast conserving surgery prior to radiotherapy.   __________________________________________   Eppie Gibson, MD

## 2015-08-21 NOTE — Patient Instructions (Signed)

## 2015-08-21 NOTE — Progress Notes (Signed)
Met with patient and her daughter at [facility] to introduce [facility] team/resources, reviewing distress screen per protocol.  The patient scored a 5 on the Psychosocial Distress Thermometer which indicates moderate distress.  Also assessed for distress and other psychosocial needs.  ONCBCN DISTRESS SCREENING 08/21/2015  Distress experienced in past week (1-10) 5  Emotional problem type Adjusting to illness  Referral to support programs Yes  Other Spiritual Care, Counseling Intern   Counselor used open question to assess how patient was processing all the news, how she was feelings, and discussed reactions regarding her treatment plan. Client reported having some anxiety, but feeling better after getting more information and speaking to the [facility] team. Daughter reported that she was taking copious notes in order to be as informed as possible, and help provide ample support to her mother. Patient reported having strong support system from her family, friends, and church.  Follow up needed:  [N] Pt is aware of ongoing Support Team availability and contact information. Please also call as needs arise/circumstances change.  Thank you.   Wendall Papa, MS, [place], [facility] Counseling Intern-Department for Spiritual Care and [place], [place], [place]

## 2015-08-21 NOTE — Telephone Encounter (Signed)
appt made per GM 6/14 pof

## 2015-08-21 NOTE — Therapy (Signed)
[place], [place], [address] Phone: [phone]   Fax:  [phone]  Physical Therapy Evaluation  Patient Details  Name: Kristine Parrish MRN: [id number] Date of Birth: Jul 31, 1945 Referring Provider: Dr. Autumn Messing  Encounter Date: 08/21/2015      PT End of Session - 08/21/15 1626    Visit Number 1   Number of Visits 1   PT Start Time 3710   PT Stop Time 1605   PT Time Calculation (min) 35 min   Activity Tolerance Patient tolerated treatment well   Behavior During Therapy [facility] for tasks assessed/performed      Past Medical History  Diagnosis Date  . Type 2 diabetes mellitus ([place])   . Peripheral neuropathy ([place])   . Peptic ulcer disease   . Hiatal hernia   . Colon polyps   . Breast cancer of upper-outer quadrant of left female breast ([place]) 08/15/2015  . Hypertension     Past Surgical History  Procedure Laterality Date  . Vaginal hysterectomy  1995  . Cholecystectomy  1996  . Nerve removed from left foot  2000  . Appendectomy  1974  . Cataract surgery  04/2012  . Tubal ligation      There were no vitals filed for this visit.       Subjective Assessment - 08/21/15 1626    Subjective Patient reports she is here today to be seen by her medical team for her newly diagnosed left breast cancer.   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 It is located in the upper outer quadrant and measures 2.7 cm.  It is ER/PR positive and HER2 positive with a KI67 of 50%.  She laso had an enlarged axillary node biopsied which was found to be positive.   Patient Stated Goals reduce lymphedema risk and learn post op shoulder ROM HEP            [facility] PT Assessment - 08/21/15 0001    Assessment   Medical Diagnosis Left breast cancer   Referring Provider Dr. Autumn Messing   Onset Date/Surgical Date 08/09/15   Hand Dominance Right   Prior  Therapy none   Precautions   Precautions Other (comment)   Precaution Comments Active breast cancer   Restrictions   Weight Bearing Restrictions No   Other Position/Activity Restrictions Has current non-healing fracture in right foot which impedes her ability to ambulate long distances   Balance Screen   Has the patient fallen in the past 6 months No   Has the patient had a decrease in activity level because of a fear of falling?  No   Is the patient reluctant to leave their home because of a fear of falling?  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   Cognition   Overall Cognitive Status Within Functional Limits for tasks assessed   Posture/Postural Control   Posture/Postural Control Postural limitations   Postural Limitations Rounded Shoulders;Forward head   ROM / Strength  AROM / PROM / Strength AROM;Strength   AROM   AROM Assessment Site Shoulder   Right/Left Shoulder Right;Left   Right Shoulder Extension 61 Degrees   Right Shoulder Flexion 138 Degrees   Right Shoulder ABduction 167 Degrees   Right Shoulder Internal Rotation 81 Degrees   Right Shoulder External Rotation 85 Degrees   Left Shoulder Extension 53 Degrees   Left Shoulder Flexion 145 Degrees   Left Shoulder ABduction 155 Degrees   Left Shoulder Internal Rotation 76 Degrees   Left Shoulder External Rotation 89 Degrees   Strength   Overall Strength Within functional limits for tasks performed   Ambulation/Gait   Gait Comments Difficulty with ambulation due to foot fracture that is not healing            LYMPHEDEMA/ONCOLOGY QUESTIONNAIRE - 08/21/15 1624    Type   Cancer Type Left breast cancer   Lymphedema Assessments   Lymphedema Assessments Upper extremities   Right Upper Extremity Lymphedema   10 cm Proximal to Olecranon Process 28.9 cm   Olecranon Process 24.6 cm   10 cm Proximal to Ulnar Styloid Process 22.9 cm   Just Proximal to Ulnar Styloid Process 15.5 cm   Across Hand at [employer] 17.5 cm   At [place] of 2nd Digit 5.8 cm   Left Upper Extremity Lymphedema   10 cm Proximal to Olecranon Process 28.8 cm   Olecranon Process 24.5 cm   10 cm Proximal to Ulnar Styloid Process 21.3 cm   Just Proximal to Ulnar Styloid Process 14.7 cm   Across Hand at [employer] 17.8 cm   At [place] of 2nd Digit 5.4 cm      Patient was instructed today in a home exercise program today for post op shoulder range of motion. These included active assist shoulder flexion in sitting, scapular retraction, wall walking with shoulder abduction, and hands behind head external rotation.  She was encouraged to do these twice a day, holding 3 seconds and repeating 5 times when permitted by her physician.         PT Education - 08/21/15 1625    Education provided Yes   Education Details Lymphedema risk reduction and post op shoulder ROM HEP   Person(s) Educated Patient;Child(ren)   Methods Explanation;Demonstration;Handout   Comprehension Returned demonstration;Verbalized understanding              Breast Clinic Goals - 08/21/15 1632    Patient will be able to verbalize understanding of pertinent lymphedema risk reduction practices relevant to her diagnosis specifically related to skin care.   Time 1   Period Days   Status Achieved   Patient will be able to return demonstrate and/or verbalize understanding of the post-op home exercise program related to regaining shoulder range of motion.   Time 1   Period Days   Status Achieved   Patient will be able to verbalize  understanding of the importance of attending the postoperative After Breast Cancer Class for further lymphedema risk reduction education and therapeutic exercise.   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 It is located in the upper outer quadrant and measures 2.7 cm.  It is ER/PR positive and HER2 positive with a KI67 of 50%.  She also had an  enlarged axillary node biopsied which was found to be positive.  Her multidisciplinary medical team met prior to her assessments to determine a recommended treatment plan.  She is planning to undergo neoadjuvant chemotherapy followed by a left lumpectomy and either sentinel node biopsy or axillary node dissection depending on the response to chemotherapy, followed by radiation and anti-estrogen therapy.  She would benefit from post op PT to regain shoulder ROM and reduce lymphedema, especially if she has a node dissection.  Her other complication is her non-healing right foot fracture.  This impedes her ability to exercise or return to daily walks which are essential for allowing her to get through chemotherapy with less difficulty and for reduceing recurrence risk.  She was encouraged to talk with her physician and consider talking to a doctor about other treatment for her foot since she reports no improvement of=ver the past several months.  She lives alone but has her daughter close by.  Due to her comorbidities of her chronic back pain and her non-healing foot fracture and living alone, her eval is of moderate complexity for PT planning as it relates to her treatment plan.   Rehab Potential Good   Clinical Impairments Affecting Rehab Potential Difficulty exercising due to her foot fracture   PT Frequency One time visit   PT Treatment/Interventions Therapeutic exercise;Patient/family education   PT  Next Visit Plan Will f/u as needed   PT Home Exercise Plan Post op shoulder ROM HEP   Consulted and Agree with Plan of Care Patient;Family member/caregiver   Family Member Consulted daughter      Patient will benefit from skilled therapeutic intervention in order to improve the following deficits and impairments:  Decreased strength, Decreased knowledge of precautions, Pain, Impaired UE functional use, Decreased range of motion, Abnormal gait  Visit Diagnosis: Carcinoma of left breast upper inner quadrant ([place]) - Plan: PT plan of care cert/re-cert  Abnormal posture - Plan: PT plan of care cert/re-cert  Difficulty in walking, not elsewhere classified - Plan: PT plan of care cert/re-cert   Patient will follow up at outpatient cancer rehab if needed following surgery.  If the patient requires physical therapy at that time, a specific plan will be dictated and sent to the referring physician for approval. The patient was educated today on appropriate basic range of motion exercises to begin post operatively and the importance of attending the After Breast Cancer class following surgery.  Patient was educated today on lymphedema risk reduction practices as it pertains to recommendations that will benefit the patient immediately following surgery.  She verbalized good understanding.  No additional physical therapy is indicated at this time.      Problem List Patient Active Problem List   Diagnosis Date Noted  . Breast cancer of upper-outer quadrant of left female breast ([place]) 08/15/2015    Annia Friendly, PT 08/21/2015 4:34 PM  [address] [place], [place], [address] Phone: [phone]   Fax:  [phone]  Name: ZAILEE VALLELY MRN: [id number] Date of Birth: 1945-04-24

## 2015-08-21 NOTE — Progress Notes (Signed)
[place]  [phone] Fax:(336) [phone]     ID: Kristine Parrish DOB: 1946-01-15  MR#: [id number]  HMC#:[id number]  Patient Care Team: Orpah Melter, MD as PCP - General (Family Medicine) Autumn Messing III, MD as Consulting Physician (General Surgery) Chauncey Cruel, MD as Consulting Physician (Oncology) Eppie Gibson, MD as Attending Physician (Radiation Oncology) Murvin Donning, MD (Dermatology) Laurence Spates, MD as Consulting Physician (Gastroenterology) Rosemary Holms, DPM as Consulting Physician (Podiatry) Viona Gilmore Evette Cristal, MD as Consulting Physician (Obstetrics and Gynecology) Larey Dresser, MD as Consulting Physician (Cardiology) PCP: Orpah Melter, MD OTHER MD:  CHIEF COMPLAINT: Estrogen receptor positive breast cancer  CURRENT TREATMENT: Awaiting definitive surgery   BREAST CANCER HISTORY: Kristine Parrish herself noted a change in her left breast and brought it to the attention of Dr. Nori Riis, who set her up for bilateral diagnostic mammography with tomography and left breast ultrasonography at the [facility] 08/09/2015. The breast density was category D. In the area of palpable concern there was a mass with irregular margins and architectural distortion, measuring approximately 3 cm. This was palpable in the upper outer quadrant near the nipple. The ultrasound confirmed an irregular hypoechoic mass approximately 3 cm from the nipple at the 10:00 position measuring 2.7 cm. Ultrasound of the left axilla found a single level I left axillary node with focal cortical thickening.  On 08/13/2015 the patient underwent biopsy of the left breast mass and the suspicious left axillary lymph node, both showing invasive ductal carcinoma, grade 2 or 3, estrogen receptor 100% positive, progesterone receptor 100% positive, both with strong staining intensity, with an MIB-1 of 50%, and HER-2 amplification, the signals ratio being 2.2 to and the number per cell 3.55  The  patient's subsequent history is as detailed below  INTERVAL HISTORY: Kristine Parrish was evaluated in the multidisciplinary breast cancer clinic 08/21/2015 accompanied by her daughter Kristine Parrish. Her case was also presented in the multidisciplinary breast cancer conference that same morning. At that time a preliminary plan was proposed: neoadjuvant chemotherapy and immunotherapy followed by lumpectomy with targeted axillary dissection and consideration of participation in the Alliance trial. The patient did not meet criteria for genetics testing.   REVIEW OF SYSTEMS:  aside from the mass itself, there were no specific symptoms leading to the original mammogram, which was routinely scheduled. The patient denies unusual headaches, visual changes, nausea, vomiting, stiff neck, dizziness, or gait imbalance. There has been no cough, phlegm production, or pleurisy, no chest pain or pressure, and no change in bowel or bladder habits. The patient denies fever, rash, bleeding, unexplained fatigue or unexplained weight loss. Kristine Parrish does have some neuropathy particularly involving the feet secondary to her diabetes and can have some stabbing pains associated with this. She has some chronic back pain as well. This is not more intense or persistent than before. A detailed review of systems was otherwise entirely negative.    PAST MEDICAL HISTORY: Past Medical History  Diagnosis Date  . Type 2 diabetes mellitus ([place])   . Peripheral neuropathy ([place])   . Peptic ulcer disease   . Hiatal hernia   . Colon polyps   . Breast cancer of upper-outer quadrant of left female breast ([place]) 08/15/2015  . Hypertension     PAST SURGICAL HISTORY: Past Surgical History  Procedure Laterality Date  . Vaginal hysterectomy  1995  . Cholecystectomy  1996  . Nerve removed from left foot  2000  . Appendectomy  1974  . Cataract surgery  04/2012  .  Tubal ligation      FAMILY HISTORY Family History  Problem Relation Age of Onset  .  Diabetes Mother   . Hypertension Mother   . Alcoholism Maternal Grandfather   . Congestive Heart Failure Maternal Grandmother   The patient has little information about her father and is not sure of his cause of death or age at death. The patient's mother died at age 66. The patient had no siblings. She was raised by her grandmother. She is not aware of any breast or ovarian cancer history in the family   GYNECOLOGIC HISTORY:  No LMP recorded. Patient has had a hysterectomy.  menarche age 70, first live birth age 70. The patient is GX P2. She underwent hysterectomy with bilateral salpingo-oophorectomy in 1994, and has been on estrogen replacement since that time, discontinued June 2017.   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 Daughter Kristine Parrish lives in [place] and works as a Teacher, music for the  [employer]. Son Kristine Parrish lives in [place] where he is Secondary school teacher. The patient had one grandchild. She attends [employer]. The patient's daughter [name] is her healthcare power of attorney.     HEALTH MAINTENANCE: Social History  Substance Use Topics  . Smoking status: Never Smoker   . Smokeless tobacco: Never Used  . Alcohol Use: No     Colonoscopy: 2012/[facility]  PAP:  Bone density: 2015/"normal" at Dr. Verlon Au  Lipid panel:  No Known Allergies  Current Outpatient Prescriptions  Medication Sig Dispense Refill  . aspirin 81 MG tablet Take 81 mg by mouth daily.    . Calcium Carb-Cholecalciferol (CALCIUM + D3) 600-200 MG-UNIT TABS Take by mouth.    . estrogens, conjugated, (PREMARIN) 0.625 MG tablet Take 0.625 mg by mouth daily. Take daily for 21 days then do not take for 7 days.    Marland Kitchen gabapentin (NEURONTIN) 600 MG tablet TAKE ONE TABLET BY MOUTH THREE TIMES DAILY  90 tablet 5  . hydrochlorothiazide (MICROZIDE)  12.5 MG capsule Take 12.5 mg by mouth daily.    Marland Kitchen lovastatin (MEVACOR) 20 MG tablet Take 20 mg by mouth at bedtime.    . Multiple Vitamin (MULTIVITAMIN) tablet Take 1 tablet by mouth daily.    Marland Kitchen omeprazole (PRILOSEC) 20 MG capsule Take 20 mg by mouth daily.    Marland Kitchen glucose blood (FREESTYLE LITE) test strip 1 each by Other route as needed for other. Use as instructed    . Probiotic Product (PROBIOTIC DAILY PO) Take by mouth. Reported on 08/21/2015    . venlafaxine XR (EFFEXOR-XR) 37.5 MG 24 hr capsule Take 1 capsule (37.5 mg total) by mouth daily with breakfast. 30 capsule 4   No current facility-administered medications for this visit.    OBJECTIVE: Middle-aged white woman who appears stated age 12 Vitals:   08/21/15 1254  BP: 153/74  Pulse: 90  Temp: 98.3 F (36.8 C)  Resp: 20     Body mass index is 28.29 kg/(m^2).    ECOG FS:1 - Symptomatic but completely ambulatory  Ocular: Sclerae unicteric, pupils equal, round and reactive to light Ear-nose-throat: Oropharynx clear and moist Lymphatic: No cervical or supraclavicular adenopathy Lungs no rales or rhonchi, good excursion bilaterally Heart regular rate and rhythm, no murmur appreciated Abd soft, nontender, positive bowel sounds MSK no focal spinal tenderness, no joint edema Neuro: non-focal, well-oriented,  appropriate affect Breasts: the right breast is unremarkable. In the left breast upper outer quadrant there is a 2-3 cm mass which is palpable, movable, and associated with an small ecchymosis from the recent biopsy. I do not palpate any axillary adenopathy.    LAB RESULTS:  CMP     Component Value Date/Time   NA 138 08/21/2015 1220   K 3.1* 08/21/2015 1220   CO2 24 08/21/2015 1220   GLUCOSE 182* 08/21/2015 1220   BUN 8.4 08/21/2015 1220   CREATININE 0.9 08/21/2015 1220   CALCIUM 9.0 08/21/2015 1220   PROT 7.3 08/21/2015 1220   PROT 7.0 03/16/2013 0831   ALBUMIN 3.4* 08/21/2015 1220   AST 24 08/21/2015 1220   ALT 29  08/21/2015 1220   ALKPHOS 148 08/21/2015 1220   BILITOT 0.35 08/21/2015 1220    INo results found for: SPEP, UPEP  Lab Results  Component Value Date   WBC 9.6 08/21/2015   NEUTROABS 5.6 08/21/2015   HGB 12.4 08/21/2015   HCT 37.5 08/21/2015   MCV 84.3 08/21/2015   PLT 274 08/21/2015      Chemistry      Component Value Date/Time   NA 138 08/21/2015 1220   K 3.1* 08/21/2015 1220   CO2 24 08/21/2015 1220   BUN 8.4 08/21/2015 1220   CREATININE 0.9 08/21/2015 1220      Component Value Date/Time   CALCIUM 9.0 08/21/2015 1220   ALKPHOS 148 08/21/2015 1220   AST 24 08/21/2015 1220   ALT 29 08/21/2015 1220   BILITOT 0.35 08/21/2015 1220       No results found for: LABCA2  No components found for: LABCA125  No results for input(s): INR in the last 168 hours.  Urinalysis No results found for: COLORURINE, APPEARANCEUR, LABSPEC, PHURINE, GLUCOSEU, HGBUR, BILIRUBINUR, KETONESUR, PROTEINUR, UROBILINOGEN, NITRITE, LEUKOCYTESUR      STUDIES: Mm Digital Diagnostic Unilat L  08/13/2015  CLINICAL DATA:  Post left breast ultrasound-guided core biopsy. EXAM: DIAGNOSTIC LEFT MAMMOGRAM POST ULTRASOUND BIOPSY COMPARISON:  Previous exam(s). FINDINGS: Mammographic images were obtained following ultrasound guided biopsy of the palpable mass located within the left breast at the 2 o'clock position and the left axillary lymph node with focal cortical thickening. The ribbon shaped clip is in appropriate position associated with the left breast mass located at the 2 o'clock position. The spiral shaped HydroMARK clip is in appropriate position associated with the mildly prominent left axillary lymph node as visualized in the left MLO projection. IMPRESSION: Appropriate positioning of clips following left breast ultrasound-guided biopsies as discussed above. Final Assessment: Post Procedure Mammograms for Marker Placement Electronically Signed   By: Altamese Cabal M.D.   On: 08/13/2015 13:38    US Breast Ltd Uni Left Inc Axilla  08/13/2015  ADDENDUM REPORT: 08/13/2015 17:05 ADDENDUM: In the findings of the report, the 5th paragraph, 1st sentence should read: Targeted left breast ultrasound is performed showing a hypoechoic mass with irregular margins demonstrating equal acoustic characteristics at the 2 O'CLOCK POSITION approximately 3 cm from the nipple. Electronically Signed   By: Evangeline Dakin M.D.   On: 08/13/2015 17:05  08/13/2015  CLINICAL DATA:  70 year old with a palpable lump in the outer periareolar left breast, confirmed on recent clinical breast examination. Annual evaluation, right breast. EXAM: 2D DIGITAL DIAGNOSTIC BILATERAL MAMMOGRAM WITH CAD AND ADJUNCT TOMO ULTRASOUND LEFT BREAST COMPARISON:  Mammography 10/02/2014, 09/25/2013 and earlier. No prior ultrasound. ACR Breast Density Category d: The breast tissue is extremely dense, which lowers the  sensitivity of mammography. FINDINGS: Standard 2D and tomosynthesis CC and MLO views of both breasts and a standard 2D spot tangential view of the area palpable concern in the left breast were obtained. Corresponding to the area of palpable concern is a mass with irregular margins, associated with architectural distortion and scattered microcalcifications in the outer left breast, anterior depth. The mass measures approximately 3 cm maximally. No findings suspicious for malignancy in the right breast. Mammographic images were processed with CAD. On physical exam, there is a firm palpable 2-3 cm mass in the upper outer periareolar left breast. Targeted left breast ultrasound is performed, showing a hypoechoic mass with irregular margins demonstrating equal acoustic characteristics at the 10 o'clock position approximately 3 cm from the nipple measuring approximately 2.6 x 1.9 x 2.7 cm, demonstrating internal power Doppler flow. This corresponds to the area of palpable concern and the mammographic abnormality. Sonographic evaluation of the left  axilla demonstrates a solitary level 1 low left axillary node with focal cortical thickening up to 4 mm. IMPRESSION: 1. Highly suspicious approximate 2.7 cm mass in the upper outer periareolar left breast accounting for the area of palpable concern. 2. Focal cortical thickening involving a level 1 low left axillary lymph node. 3. No mammographic evidence of malignancy, right breast. RECOMMENDATION: Ultrasound-guided core needle biopsy of the left breast mass and the left axillary lymph node with focal cortical thickening. The ultrasound biopsy procedure was discussed with the patient and her questions were answered. She has agreed to proceed and this has been scheduled for Tuesday, June 6 at 11:30 a.m. I have discussed the findings and recommendations with the patient. Results were also provided in writing at the conclusion of the visit. If applicable, a reminder letter will be sent to the patient regarding the next appointment. BI-RADS CATEGORY  5: Highly suggestive of malignancy. Electronically Signed: By: Evangeline Dakin M.D. On: 08/09/2015 15:14   Mm Diag Breast Tomo Bilateral  08/13/2015  ADDENDUM REPORT: 08/13/2015 17:05 ADDENDUM: In the findings of the report, the 5th paragraph, 1st sentence should read: Targeted left breast ultrasound is performed showing a hypoechoic mass with irregular margins demonstrating equal acoustic characteristics at the 2 O'CLOCK POSITION approximately 3 cm from the nipple. Electronically Signed   By: Evangeline Dakin M.D.   On: 08/13/2015 17:05  08/13/2015  CLINICAL DATA:  70 year old with a palpable lump in the outer periareolar left breast, confirmed on recent clinical breast examination. Annual evaluation, right breast. EXAM: 2D DIGITAL DIAGNOSTIC BILATERAL MAMMOGRAM WITH CAD AND ADJUNCT TOMO ULTRASOUND LEFT BREAST COMPARISON:  Mammography 10/02/2014, 09/25/2013 and earlier. No prior ultrasound. ACR Breast Density Category d: The breast tissue is extremely dense, which  lowers the sensitivity of mammography. FINDINGS: Standard 2D and tomosynthesis CC and MLO views of both breasts and a standard 2D spot tangential view of the area palpable concern in the left breast were obtained. Corresponding to the area of palpable concern is a mass with irregular margins, associated with architectural distortion and scattered microcalcifications in the outer left breast, anterior depth. The mass measures approximately 3 cm maximally. No findings suspicious for malignancy in the right breast. Mammographic images were processed with CAD. On physical exam, there is a firm palpable 2-3 cm mass in the upper outer periareolar left breast. Targeted left breast ultrasound is performed, showing a hypoechoic mass with irregular margins demonstrating equal acoustic characteristics at the 10 o'clock position approximately 3 cm from the nipple measuring approximately 2.6 x 1.9 x 2.7 cm, demonstrating internal  power Doppler flow. This corresponds to the area of palpable concern and the mammographic abnormality. Sonographic evaluation of the left axilla demonstrates a solitary level 1 low left axillary node with focal cortical thickening up to 4 mm. IMPRESSION: 1. Highly suspicious approximate 2.7 cm mass in the upper outer periareolar left breast accounting for the area of palpable concern. 2. Focal cortical thickening involving a level 1 low left axillary lymph node. 3. No mammographic evidence of malignancy, right breast. RECOMMENDATION: Ultrasound-guided core needle biopsy of the left breast mass and the left axillary lymph node with focal cortical thickening. The ultrasound biopsy procedure was discussed with the patient and her questions were answered. She has agreed to proceed and this has been scheduled for Tuesday, June 6 at 11:30 a.m. I have discussed the findings and recommendations with the patient. Results were also provided in writing at the conclusion of the visit. If applicable, a reminder  letter will be sent to the patient regarding the next appointment. BI-RADS CATEGORY  5: Highly suggestive of malignancy. Electronically Signed: By: Evangeline Dakin M.D. On: 08/09/2015 15:14   [place] Lt Breast Bx W Loc Dev 1st Lesion Img Bx Spec US Guide  08/16/2015  ADDENDUM REPORT: 08/14/2015 16:01 ADDENDUM: Pathology revealed grade II to III invasive ductal carcinoma in the left breast. The left axillary lymph node was positive for metastatic ductal carcinoma. This was found to be concordant by Dr. Luberta Robertson. Pathology results were discussed with the patient by telephone. The patient reported doing well after the biopsy with tenderness at the site. Post biopsy instructions and care were reviewed and questions were answered. The patient was encouraged to call [facility] for any additional concerns. The patient was referred to the [facility] at the [facility] on August 21, 2015. Pathology results reported by Susa Raring RN, BSN on 08/14/2015. Electronically Signed   By: Altamese Cabal M.D.   On: 08/14/2015 16:01  08/16/2015  CLINICAL DATA:  Left breast mass with mildly prominent left axillary lymph node. EXAM: ULTRASOUND GUIDED LEFT BREAST CORE NEEDLE BIOPSY COMPARISON:  Previous exam(s). FINDINGS: I met with the patient and we discussed the procedure of ultrasound-guided biopsy, including benefits and alternatives. We discussed the high likelihood of a successful procedure. We discussed the risks of the procedure, including infection, bleeding, tissue injury, clip migration, and inadequate sampling. Informed written consent was given. The usual time-out protocol was performed immediately prior to the procedure. Using sterile technique and 1% Lidocaine as local anesthetic, under direct ultrasound visualization, a 14 gauge spring-loaded device was used to perform biopsy of the palpable mass located within the left breast at the 2  o'clock position using a lateral approach. At the conclusion of the procedure a ribbon shaped tissue marker clip was deployed into the biopsy cavity. Follow up 2 view mammogram was performed and dictated separately. IMPRESSION: Ultrasound guided biopsy of the palpable mass located within the left breast at the 2 o'clock position as discussed above. No apparent complications. Electronically Signed: By: Altamese Cabal M.D. On: 08/13/2015 13:34   [place] Lt Breast Bx W Loc Dev Ea Add Lesion Img Bx Spec US Guide  08/16/2015  ADDENDUM REPORT: 08/14/2015 16:02 ADDENDUM: Pathology revealed grade II to III invasive ductal carcinoma in the left breast. The left axillary lymph node was positive for metastatic ductal carcinoma. This was found to be concordant by Dr. Luberta Robertson. Pathology results were discussed with the patient by telephone.  The patient reported doing well after the biopsy with tenderness at the site. Post biopsy instructions and care were reviewed and questions were answered. The patient was encouraged to call [facility] for any additional concerns. The patient was referred to the [facility] at the [facility] on August 21, 2015. Pathology results reported by Susa Raring RN, BSN on 08/14/2015. Electronically Signed   By: Altamese Cabal M.D.   On: 08/14/2015 16:02  08/16/2015  CLINICAL DATA:  Palpable left breast mass and left axillary lymph node with focal cortical thickening. EXAM: ULTRASOUND GUIDED LEFT BREAST CORE NEEDLE BIOPSY COMPARISON:  Previous exam(s). FINDINGS: I met with the patient and we discussed the procedure of ultrasound-guided biopsy, including benefits and alternatives. We discussed the high likelihood of a successful procedure. We discussed the risks of the procedure, including infection, bleeding, tissue injury, clip migration, and inadequate sampling. Informed written consent was given. The usual time-out  protocol was performed immediately prior to the procedure. Using sterile technique and 1% Lidocaine as local anesthetic, under direct ultrasound visualization, a 14 gauge spring-loaded device was used to perform biopsy of the left axillary lymph node with focal cortical thickening using a lateral approach. At the conclusion of the procedure a HydroMARK tissue marker clip was deployed into the biopsy cavity. Follow up 2 view mammogram was performed and dictated separately. IMPRESSION: Ultrasound guided biopsy of the left axillary lymph node with mild focal cortical thickening as discussed above. No apparent complications. Electronically Signed: By: Altamese Cabal M.D. On: 08/13/2015 13:36    ELIGIBLE FOR AVAILABLE RESEARCH PROTOCOL: PALLAS, ALLIANCE  ASSESSMENT: 70 y.o. [place] woman status post left breast upper outer quadrant and left axillary lymph node biopsy 08/13/2015 both positive for an invasive ductal carcinoma, grade 2 or 3, triple positive, with an MIB-1 of 50%  (1) neoadjuvant chemotherapy will consist of carboplatin, docetaxel, trastuzumab and pertuzumab given every 21 days 6, beginning 09/09/2015  (2) trastuzumab will be continued to complete a year  (3) definitive surgery to follow with TAD and consideration of the Alliance trial  (4) adjuvant radiation to follow as appropriate  (5) anti-estrogens to follow, with consideration of the PALLAS trial  PLAN: We spent the better part of today's hour-long appointment discussing the biology of breast cancer in general, and the specifics of the patient's tumor in particular. We first reviewed the fact that cancer is not one disease but more than 100 different diseases and that it is important to keep them separate-- otherwise when friends and relatives discuss their own cancer experiences with the patbreast confusion can result. Similarly we explained that if rest cancer spreads to the bone or liver, the patient would not have bone  cancer or liver cancer, but breast cancer in the bone and breast cancer in the liver, namely one cancer in three places-- not 3 different cancers which otherwise would have to be treated in 3 different ways.  We discussed the difference between local and systemic therapy. In terms of local treatment, lumpectomy plus radiation is equivalent to mastectomy as far as survival is concerned. We also noted that in terms of sequencing of treatments, whether systemic therapy or surgery is done first does not affect the ultimate outcome.  We then reviewed systemic therapy options. These include anti-estrogens, anti-HER-2 treatment, and chemotherapy. She is a good candidate for all 3. This is very favorable because if we assume her risk of recurrence with local treatment only is  X, chemotherapy will lower X by one third, anti-HER-2 treatment will cut the remaining 2/3X in half, and anti-estrogen treatment will cut the remaining 1/3X in half again, so her final risk of recurrence will be approximately 1/6 of her baseline risk.  Standard therapy in this situation is carboplatin, docetaxel, trastuzumab, and pertuzumab given every 21 days 6, followed by continuing trastuzumab to complete one year. We discussed the possible toxicities, side effects and complications of these treatments and I am particularly concerned in her case because she has significant diabetes and already has neuropathy. If this worsens we will discontinue the docetaxel and consider substituting gemcitabine.  She is being set up for an echocardiogram and a visit with cardio oncology as well as our "chemotherapy school". She will have a port placed within the next week or so. She will meet with me 09/06/2015 to discuss anti-emetics and other supportive measures and we hope to start her chemotherapy on 09/09/2015.  Reda has a good understanding of the overall plan. She agrees with it. She knows the goal of treatment in her case is cure. She will call  with any problems that may develop before her next visit here.  Chauncey Cruel, MD   08/22/2015 7:25 AM Medical Oncology and Hematology [facility] [address] [place], [place] [address] Tel. [phone]    Fax. [phone]

## 2015-08-23 ENCOUNTER — Telehealth: Payer: Self-pay | Admitting: Oncology

## 2015-08-23 NOTE — Telephone Encounter (Signed)
s.w. pt and r/s class....pt ok and aware of new d.t

## 2015-08-26 ENCOUNTER — Telehealth: Payer: Self-pay | Admitting: *Deleted

## 2015-08-26 ENCOUNTER — Ambulatory Visit
Admission: RE | Admit: 2015-08-26 | Discharge: 2015-08-26 | Disposition: A | Discharge status: Home / Self Care | Payer: BLUE CROSS/BLUE SHIELD | Source: Ambulatory Visit | Attending: Oncology | Admitting: Oncology

## 2015-08-26 DIAGNOSIS — D0512 Intraductal carcinoma in situ of left breast: Secondary | ICD-10-CM | POA: Diagnosis not present

## 2015-08-26 DIAGNOSIS — C50412 Malignant neoplasm of upper-outer quadrant of left female breast: Secondary | ICD-10-CM

## 2015-08-26 MED ORDER — GADOBENATE DIMEGLUMINE 529 MG/ML IV SOLN
13.0000 mL | Freq: Once | INTRAVENOUS | Status: AC | PRN
  Administered 2015-08-26: 13 mL via INTRAVENOUS

## 2015-08-26 NOTE — Telephone Encounter (Signed)
Spoke with patient from [facility] 08/21/15.  She states she is doing well.  Explained to that we need to get her echo before she starts chemo.  She would like to do this on 7/3 as she is off work that day .   Informed her I would check with the cardio-oncology clinic and let her know.

## 2015-08-28 ENCOUNTER — Other Ambulatory Visit: Payer: BLUE CROSS/BLUE SHIELD

## 2015-08-29 ENCOUNTER — Telehealth: Payer: Self-pay | Admitting: *Deleted

## 2015-08-29 NOTE — Telephone Encounter (Signed)
  Oncology Nurse Navigator Documentation    Navigator Encounter Type: Telephone (08/29/15 1000) Telephone: Kristine Parrish Call;Appt Confirmation/Clarification (Echo appt r/s to 7/3 at 1000. Pt notified) (08/29/15 1000)                                        Time Spent with Patient: 30 (08/29/15 1000)

## 2015-08-30 ENCOUNTER — Other Ambulatory Visit: Payer: BLUE CROSS/BLUE SHIELD

## 2015-08-30 ENCOUNTER — Telehealth: Payer: Self-pay | Admitting: Oncology

## 2015-08-30 ENCOUNTER — Encounter: Payer: Self-pay | Admitting: *Deleted

## 2015-08-30 NOTE — Telephone Encounter (Signed)
Pt came in wanted tx on thursdays.. Moved all tx to thursdays

## 2015-08-30 NOTE — Telephone Encounter (Signed)
Gave pt calenders

## 2015-09-03 ENCOUNTER — Encounter (HOSPITAL_COMMUNITY): Payer: Self-pay

## 2015-09-03 ENCOUNTER — Encounter (HOSPITAL_COMMUNITY)
Admission: RE | Admit: 2015-09-03 | Discharge: 2015-09-03 | Disposition: A | Discharge status: Home / Self Care | Payer: BLUE CROSS/BLUE SHIELD | Source: Ambulatory Visit | Attending: General Surgery | Admitting: General Surgery

## 2015-09-03 ENCOUNTER — Other Ambulatory Visit (HOSPITAL_COMMUNITY): Payer: Self-pay | Admitting: *Deleted

## 2015-09-03 ENCOUNTER — Other Ambulatory Visit: Payer: Self-pay

## 2015-09-03 DIAGNOSIS — E119 Type 2 diabetes mellitus without complications: Secondary | ICD-10-CM | POA: Diagnosis not present

## 2015-09-03 DIAGNOSIS — Z7984 Long term (current) use of oral hypoglycemic drugs: Secondary | ICD-10-CM | POA: Diagnosis not present

## 2015-09-03 DIAGNOSIS — I10 Essential (primary) hypertension: Secondary | ICD-10-CM | POA: Diagnosis not present

## 2015-09-03 DIAGNOSIS — C50412 Malignant neoplasm of upper-outer quadrant of left female breast: Secondary | ICD-10-CM | POA: Diagnosis not present

## 2015-09-03 DIAGNOSIS — Z79899 Other long term (current) drug therapy: Secondary | ICD-10-CM | POA: Diagnosis not present

## 2015-09-03 HISTORY — DX: Personal history of other diseases of the respiratory system: Z87.09

## 2015-09-03 HISTORY — DX: Gastro-esophageal reflux disease without esophagitis: K21.9

## 2015-09-03 LAB — BASIC METABOLIC PANEL
Anion gap: 9 (ref 5–15)
BUN: 7 mg/dL (ref 6–20)
CO2: 28 mmol/L (ref 22–32)
Calcium: 9.1 mg/dL (ref 8.9–10.3)
Chloride: 102 mmol/L (ref 101–111)
Creatinine, Ser: 0.76 mg/dL (ref 0.44–1.00)
GFR calc Af Amer: >60 mL/min (ref >60)
GFR calc non Af Amer: >60 mL/min (ref >60)
Glucose, Bld: 161 mg/dL — ABNORMAL HIGH (ref 65–99)
Potassium: 3.3 mmol/L — ABNORMAL LOW (ref 3.5–5.1)
Sodium: 139 mmol/L (ref 135–145)

## 2015-09-03 LAB — CBC
HCT: 37.3 % (ref 36.0–46.0)
Hemoglobin: 11.9 g/dL — ABNORMAL LOW (ref 12.0–15.0)
MCH: 27.7 pg (ref 26.0–34.0)
MCHC: 31.9 g/dL (ref 30.0–36.0)
MCV: 86.7 fL (ref 78.0–100.0)
Platelets: 231 10*3/uL (ref 150–400)
RBC: 4.3 MIL/uL (ref 3.87–5.11)
RDW: 13.5 % (ref 11.5–15.5)
WBC: 7.3 10*3/uL (ref 4.0–10.5)

## 2015-09-03 LAB — GLUCOSE, CAPILLARY: Glucose-Capillary: 200 mg/dL — ABNORMAL HIGH (ref 65–99)

## 2015-09-03 NOTE — Progress Notes (Signed)
PCP - Dr Christella Noa Cardiologist - [place] - patient has not seen him yet her cancer MD wanted her to see a cardiologist  Chest x-ray - none EKG - 09/03/15 pending Stress Test - denies ECHO - planned echo for 7-3 at cancer center Cardiac Cath - denies    Patient denies shortness of breath and chest pain at PAT appointment

## 2015-09-03 NOTE — Pre-Procedure Instructions (Addendum)
Kristine Parrish  09/03/2015      [id number] - [place], [place] - [address] [place] [address] Phone: [phone] Fax: [phone]    Your procedure is scheduled on June 29th  Report to [facility] at [place].M.  Call this number if you have problems the morning of surgery:  [phone]   Remember:  Do not eat food or drink liquids after midnight.  Take these medicines the morning of surgery with A SIP OF WATER Gabapentin (neurontin), omeprazole (prilosec), venlafaxine (effexor)  7 days prior to surgery STOP taking any Aspirin, Aleve, Naproxen, Ibuprofen, Motrin, Advil, Goody's, BC's, all herbal medications, fish oil, and all vitamins    How to Manage Your Diabetes Before and After Surgery  Why is it important to control my blood sugar before and after surgery? . Improving blood sugar levels before and after surgery helps healing and can limit problems. . A way of improving blood sugar control is eating a healthy diet by: o  Eating less sugar and carbohydrates o  Increasing activity/exercise o  Talking with your doctor about reaching your blood sugar goals . High blood sugars (greater than 180 mg/dL) can raise your risk of infections and slow your recovery, so you will need to focus on controlling your diabetes during the weeks before surgery. . Make sure that the doctor who takes care of your diabetes knows about your planned surgery including the date and location.  How do I manage my blood sugar before surgery? . Check your blood sugar at least 4 times a day, starting 2 days before surgery, to make sure that the level is not too high or low. o Check your blood sugar the morning of your surgery when you wake up and every 2 hours until you get to the Short Stay unit. . If your blood sugar is less than 70 mg/dL, you will need to treat for low blood sugar: o Do not take insulin. o Treat a low blood sugar (less than  70 mg/dL) with  cup of clear juice (cranberry or apple), 4 glucose tablets, OR glucose gel. o Recheck blood sugar in 15 minutes after treatment (to make sure it is greater than 70 mg/dL). If your blood sugar is not greater than 70 mg/dL on recheck, call [phone] for further instructions. . Report your blood sugar to the short stay nurse when you get to Short Stay.  . If you are admitted to the hospital after surgery: o Your blood sugar will be checked by the staff and you will probably be given insulin after surgery (instead of oral diabetes medicines) to make sure you have good blood sugar levels. o The goal for blood sugar control after surgery is 80-180 mg/dL.            Do not wear lotions, powders, or cologne.  You may NOT wear deoderant.  Men may shave face and neck.  Do not bring valuables to the hospital.  [facility] is not responsible for any belongings or valuables.  Contacts, dentures or bridgework may not be worn into surgery.  Leave your suitcase in the car.  After surgery it may be brought to your room.  For patients admitted to the hospital, discharge time will be determined by your treatment team.  Patients discharged the day of surgery will not be allowed to drive home.   Special instructions:   [facility]- Preparing For Surgery  Before  surgery, you can play an important role. Because skin is not sterile, your skin needs to be as free of germs as possible. You can reduce the number of germs on your skin by washing with CHG (chlorahexidine gluconate) Soap before surgery.  CHG is an antiseptic cleaner which kills germs and bonds with the skin to continue killing germs even after washing.  Please do not use if you have an allergy to CHG or antibacterial soaps. If your skin becomes reddened/irritated stop using the CHG.  Do not shave (including legs and underarms) for at least 48 hours prior to first CHG shower. It is OK to shave your face.  Please follow these  instructions carefully.   1. Shower the NIGHT BEFORE SURGERY and the MORNING OF SURGERY with CHG.   2. If you chose to wash your hair, wash your hair first as usual with your normal shampoo.  3. After you shampoo, rinse your hair and body thoroughly to remove the shampoo.  4. Use CHG as you would any other liquid soap. You can apply CHG directly to the skin and wash gently with a scrungie or a clean washcloth.   5. Apply the CHG Soap to your body ONLY FROM THE NECK DOWN.  Do not use on open wounds or open sores. Avoid contact with your eyes, ears, mouth and genitals (private parts). Wash genitals (private parts) with your normal soap.  6. Wash thoroughly, paying special attention to the area where your surgery will be performed.  7. Thoroughly rinse your body with warm water from the neck down.  8. DO NOT shower/wash with your normal soap after using and rinsing off the CHG Soap.  9. Pat yourself dry with a CLEAN TOWEL.   10. Wear CLEAN PAJAMAS   11. Place CLEAN SHEETS on your bed the night of your first shower and DO NOT SLEEP WITH PETS.    Day of Surgery: Do not apply any deodorants/lotions. Please wear clean clothes to the hospital/surgery center.     Please read over the following fact sheets that you were given. Pain Booklet

## 2015-09-04 LAB — HEMOGLOBIN A1C
Hgb A1c MFr Bld: 7.3 % — ABNORMAL HIGH (ref 4.8–5.6)
Mean Plasma Glucose: 163 mg/dL

## 2015-09-04 MED ORDER — CEFAZOLIN SODIUM-DEXTROSE 2-4 GM/100ML-% IV SOLN
2.0000 g | INTRAVENOUS | Status: AC
  Administered 2015-09-05: 2 g via INTRAVENOUS
  Filled 2015-09-04: qty 100

## 2015-09-04 NOTE — Anesthesia Preprocedure Evaluation (Addendum)
Anesthesia Evaluation  Patient identified by MRN, date of birth, ID band Patient awake    Reviewed: Allergy & Precautions, H&P , NPO status , Patient's Chart, lab work & pertinent test results  Airway Mallampati: II  TM Distance: >3 FB Neck ROM: Full    Dental no notable dental hx. (+) Teeth Intact, Dental Advisory Given   Pulmonary neg pulmonary ROS,    Pulmonary exam normal breath sounds clear to auscultation       Cardiovascular hypertension, Pt. on medications  Rhythm:Regular Rate:Normal     Neuro/Psych negative neurological ROS  negative psych ROS   GI/Hepatic Neg liver ROS, GERD  Medicated and Controlled,  Endo/Other  diabetes, Type 2, Oral Hypoglycemic Agents  Renal/GU negative Renal ROS  negative genitourinary   Musculoskeletal   Abdominal   Peds  Hematology negative hematology ROS (+)   Anesthesia Other Findings   Reproductive/Obstetrics negative OB ROS                           Anesthesia Physical Anesthesia Plan  ASA: II  Anesthesia Plan: General   Post-op Pain Management:    Induction: Intravenous  Airway Management Planned: LMA  Additional Equipment:   Intra-op Plan:   Post-operative Plan: Extubation in OR  Informed Consent: I have reviewed the patients History and Physical, chart, labs and discussed the procedure including the risks, benefits and alternatives for the proposed anesthesia with the patient or authorized representative who has indicated his/her understanding and acceptance.   Dental advisory given  Plan Discussed with: CRNA  Anesthesia Plan Comments:         Anesthesia Quick Evaluation

## 2015-09-05 ENCOUNTER — Ambulatory Visit (HOSPITAL_COMMUNITY): Payer: BLUE CROSS/BLUE SHIELD

## 2015-09-05 ENCOUNTER — Ambulatory Visit (HOSPITAL_COMMUNITY)
Admission: RE | Admit: 2015-09-05 | Discharge: 2015-09-05 | Disposition: A | Discharge status: Home / Self Care | Payer: BLUE CROSS/BLUE SHIELD | Source: Ambulatory Visit | Attending: General Surgery | Admitting: General Surgery

## 2015-09-05 ENCOUNTER — Ambulatory Visit (HOSPITAL_COMMUNITY): Payer: BLUE CROSS/BLUE SHIELD | Admitting: Internal Medicine

## 2015-09-05 ENCOUNTER — Ambulatory Visit (HOSPITAL_COMMUNITY): Payer: BLUE CROSS/BLUE SHIELD | Admitting: Anesthesiology

## 2015-09-05 ENCOUNTER — Encounter (HOSPITAL_COMMUNITY): Payer: Self-pay | Admitting: Surgery

## 2015-09-05 ENCOUNTER — Encounter (HOSPITAL_COMMUNITY)
Admission: RE | Disposition: A | Discharge status: Home / Self Care | Payer: Self-pay | Source: Ambulatory Visit | Attending: General Surgery

## 2015-09-05 DIAGNOSIS — E119 Type 2 diabetes mellitus without complications: Secondary | ICD-10-CM | POA: Insufficient documentation

## 2015-09-05 DIAGNOSIS — I10 Essential (primary) hypertension: Secondary | ICD-10-CM | POA: Diagnosis not present

## 2015-09-05 DIAGNOSIS — C50412 Malignant neoplasm of upper-outer quadrant of left female breast: Secondary | ICD-10-CM | POA: Diagnosis not present

## 2015-09-05 DIAGNOSIS — Z79899 Other long term (current) drug therapy: Secondary | ICD-10-CM | POA: Diagnosis not present

## 2015-09-05 DIAGNOSIS — Z452 Encounter for adjustment and management of vascular access device: Secondary | ICD-10-CM | POA: Diagnosis not present

## 2015-09-05 DIAGNOSIS — Z7984 Long term (current) use of oral hypoglycemic drugs: Secondary | ICD-10-CM | POA: Insufficient documentation

## 2015-09-05 DIAGNOSIS — K219 Gastro-esophageal reflux disease without esophagitis: Secondary | ICD-10-CM | POA: Diagnosis not present

## 2015-09-05 DIAGNOSIS — Z95828 Presence of other vascular implants and grafts: Secondary | ICD-10-CM

## 2015-09-05 HISTORY — PX: PORTACATH PLACEMENT: SHX2246

## 2015-09-05 LAB — GLUCOSE, CAPILLARY
Glucose-Capillary: 166 mg/dL — ABNORMAL HIGH (ref 65–99)
Glucose-Capillary: 155 mg/dL — ABNORMAL HIGH (ref 65–99)

## 2015-09-05 SURGERY — INSERTION, TUNNELED CENTRAL VENOUS DEVICE, WITH PORT
Anesthesia: General

## 2015-09-05 MED ORDER — HEPARIN SOD (PORK) LOCK FLUSH 100 UNIT/ML IV SOLN
INTRAVENOUS | Status: AC
  Filled 2015-09-05: qty 5

## 2015-09-05 MED ORDER — LIDOCAINE HCL (CARDIAC) 20 MG/ML IV SOLN
INTRAVENOUS | Status: DC | PRN
  Administered 2015-09-05: 60 mg via INTRAVENOUS

## 2015-09-05 MED ORDER — PROPOFOL 10 MG/ML IV BOLUS
INTRAVENOUS | Status: DC | PRN
  Administered 2015-09-05: 120 mg via INTRAVENOUS

## 2015-09-05 MED ORDER — HYDROCODONE-ACETAMINOPHEN 5-325 MG PO TABS: 1.0000 | ORAL_TABLET | Freq: Four times a day (QID) | ORAL | Status: DC | PRN

## 2015-09-05 MED ORDER — BUPIVACAINE HCL (PF) 0.25 % IJ SOLN
INTRAMUSCULAR | Status: DC | PRN
  Administered 2015-09-05: 30 mL

## 2015-09-05 MED ORDER — HEPARIN SOD (PORK) LOCK FLUSH 100 UNIT/ML IV SOLN
INTRAVENOUS | Status: DC | PRN
  Administered 2015-09-05: 500 [IU] via INTRAVENOUS

## 2015-09-05 MED ORDER — EPHEDRINE 5 MG/ML INJ
INTRAVENOUS | Status: AC
  Filled 2015-09-05: qty 10

## 2015-09-05 MED ORDER — CHLORHEXIDINE GLUCONATE CLOTH 2 % EX PADS: 6.0000 | MEDICATED_PAD | Freq: Once | CUTANEOUS | Status: DC

## 2015-09-05 MED ORDER — LIDOCAINE 2% (20 MG/ML) 5 ML SYRINGE
INTRAMUSCULAR | Status: AC
  Filled 2015-09-05: qty 5

## 2015-09-05 MED ORDER — 0.9 % SODIUM CHLORIDE (POUR BTL) OPTIME
TOPICAL | Status: DC | PRN
  Administered 2015-09-05: 1000 mL

## 2015-09-05 MED ORDER — PHENYLEPHRINE 40 MCG/ML (10ML) SYRINGE FOR IV PUSH (FOR BLOOD PRESSURE SUPPORT)
PREFILLED_SYRINGE | INTRAVENOUS | Status: AC
  Filled 2015-09-05: qty 10

## 2015-09-05 MED ORDER — EPHEDRINE SULFATE 50 MG/ML IJ SOLN
INTRAMUSCULAR | Status: DC | PRN
  Administered 2015-09-05: 10 mg via INTRAVENOUS

## 2015-09-05 MED ORDER — BUPIVACAINE HCL (PF) 0.25 % IJ SOLN
INTRAMUSCULAR | Status: AC
  Filled 2015-09-05: qty 30

## 2015-09-05 MED ORDER — MIDAZOLAM HCL 5 MG/5ML IJ SOLN
INTRAMUSCULAR | Status: DC | PRN
  Administered 2015-09-05: 2 mg via INTRAVENOUS

## 2015-09-05 MED ORDER — LACTATED RINGERS IV SOLN
INTRAVENOUS | Status: DC | PRN
  Administered 2015-09-05: 07:00:00 via INTRAVENOUS

## 2015-09-05 MED ORDER — PHENYLEPHRINE HCL 10 MG/ML IJ SOLN
INTRAMUSCULAR | Status: DC | PRN
  Administered 2015-09-05 (×5): 80 ug via INTRAVENOUS

## 2015-09-05 MED ORDER — FENTANYL CITRATE (PF) 250 MCG/5ML IJ SOLN
INTRAMUSCULAR | Status: DC | PRN
  Administered 2015-09-05: 25 ug via INTRAVENOUS

## 2015-09-05 MED ORDER — PROPOFOL 10 MG/ML IV BOLUS
INTRAVENOUS | Status: AC
  Filled 2015-09-05: qty 20

## 2015-09-05 MED ORDER — FENTANYL CITRATE (PF) 100 MCG/2ML IJ SOLN: 25.0000 ug | INTRAMUSCULAR | Status: DC | PRN

## 2015-09-05 MED ORDER — SODIUM CHLORIDE 0.9 % IV SOLN
INTRAVENOUS | Status: DC | PRN
  Administered 2015-09-05: 500 mL

## 2015-09-05 MED ORDER — MIDAZOLAM HCL 2 MG/2ML IJ SOLN
INTRAMUSCULAR | Status: AC
  Filled 2015-09-05: qty 2

## 2015-09-05 MED ORDER — ONDANSETRON HCL 4 MG/2ML IJ SOLN
INTRAMUSCULAR | Status: DC | PRN
  Administered 2015-09-05: 4 mg via INTRAVENOUS

## 2015-09-05 MED ORDER — FENTANYL CITRATE (PF) 250 MCG/5ML IJ SOLN
INTRAMUSCULAR | Status: AC
  Filled 2015-09-05: qty 5

## 2015-09-05 MED ORDER — SUCCINYLCHOLINE CHLORIDE 200 MG/10ML IV SOSY
PREFILLED_SYRINGE | INTRAVENOUS | Status: AC
  Filled 2015-09-05: qty 10

## 2015-09-05 SURGICAL SUPPLY — 44 items
BAG DECANTER FOR FLEXI CONT (MISCELLANEOUS) ×2 IMPLANT
BLADE SURG 15 STRL LF DISP TIS (BLADE) ×1 IMPLANT
BLADE SURG 15 STRL SS (BLADE) ×1
CHLORAPREP W/TINT 10.5 ML (MISCELLANEOUS) ×2 IMPLANT
COVER SURGICAL LIGHT HANDLE (MISCELLANEOUS) ×2 IMPLANT
CRADLE DONUT ADULT HEAD (MISCELLANEOUS) ×2 IMPLANT
DERMABOND ADHESIVE PROPEN (GAUZE/BANDAGES/DRESSINGS) ×1
DERMABOND ADVANCED .7 DNX6 (GAUZE/BANDAGES/DRESSINGS) ×1 IMPLANT
DRAPE C-ARM 42X72 X-RAY (DRAPES) ×2 IMPLANT
DRAPE CHEST BREAST 15X10 FENES (DRAPES) ×4 IMPLANT
DRAPE UTILITY XL STRL (DRAPES) ×4 IMPLANT
ELECT CAUTERY BLADE 6.4 (BLADE) ×2 IMPLANT
ELECT REM PT RETURN 9FT ADLT (ELECTROSURGICAL) ×2
ELECTRODE REM PT RTRN 9FT ADLT (ELECTROSURGICAL) ×1 IMPLANT
GAUZE SPONGE 4X4 16PLY XRAY LF (GAUZE/BANDAGES/DRESSINGS) ×2 IMPLANT
GLOVE BIO SURGEON STRL SZ7.5 (GLOVE) ×2 IMPLANT
GLOVE BIOGEL PI IND STRL 6 (GLOVE) ×1 IMPLANT
GLOVE BIOGEL PI IND STRL 7.0 (GLOVE) ×1 IMPLANT
GLOVE BIOGEL PI INDICATOR 6 (GLOVE) ×1
GLOVE BIOGEL PI INDICATOR 7.0 (GLOVE) ×1
GLOVE SURG SS PI 7.0 STRL IVOR (GLOVE) ×2 IMPLANT
GOWN STRL REUS W/ TWL LRG LVL3 (GOWN DISPOSABLE) ×2 IMPLANT
GOWN STRL REUS W/TWL LRG LVL3 (GOWN DISPOSABLE) ×2
KIT BASIN OR (CUSTOM PROCEDURE TRAY) ×2 IMPLANT
KIT PORT POWER 8FR ISP CVUE (Catheter) ×2 IMPLANT
KIT ROOM TURNOVER OR (KITS) ×2 IMPLANT
NEEDLE 22X1 1/2 (OR ONLY) (NEEDLE) IMPLANT
NEEDLE HYPO 25GX1X1/2 BEV (NEEDLE) ×2 IMPLANT
NS IRRIG 1000ML POUR BTL (IV SOLUTION) ×2 IMPLANT
PACK SURGICAL SETUP 50X90 (CUSTOM PROCEDURE TRAY) ×2 IMPLANT
PAD ARMBOARD 7.5X6 YLW CONV (MISCELLANEOUS) ×2 IMPLANT
PENCIL BUTTON HOLSTER BLD 10FT (ELECTRODE) ×2 IMPLANT
SUT MNCRL AB 4-0 PS2 18 (SUTURE) ×2 IMPLANT
SUT PROLENE 2 0 SH 30 (SUTURE) ×4 IMPLANT
SUT SILK 2 0 (SUTURE)
SUT SILK 2-0 18XBRD TIE 12 (SUTURE) IMPLANT
SUT VIC AB 3-0 SH 27 (SUTURE) ×1
SUT VIC AB 3-0 SH 27XBRD (SUTURE) ×1 IMPLANT
SYR 20ML ECCENTRIC (SYRINGE) ×4 IMPLANT
SYR 5ML LUER SLIP (SYRINGE) ×2 IMPLANT
SYR CONTROL 10ML LL (SYRINGE) ×2 IMPLANT
SYRINGE 10CC LL (SYRINGE) ×2 IMPLANT
TOWEL OR 17X24 6PK STRL BLUE (TOWEL DISPOSABLE) ×2 IMPLANT
TOWEL OR 17X26 10 PK STRL BLUE (TOWEL DISPOSABLE) ×2 IMPLANT

## 2015-09-05 NOTE — Anesthesia Procedure Notes (Signed)
Procedure Name: Intubation Date/Time: 09/05/2015 7:43 AM Performed by: Myna Bright Pre-anesthesia Checklist: Patient identified, Emergency Drugs available, Suction available and Patient being monitored Patient Re-evaluated:Patient Re-evaluated prior to inductionOxygen Delivery Method: Circle system utilized Preoxygenation: Pre-oxygenation with 100% oxygen Intubation Type: IV induction LMA: LMA inserted LMA Size: 4.0 Tube type: Oral Number of attempts: 1 Placement Confirmation: positive ETCO2 and breath sounds checked- equal and bilateral Tube secured with: Tape Dental Injury: Teeth and Oropharynx as per pre-operative assessment

## 2015-09-05 NOTE — H&P (Signed)
Kristine Parrish. Kristine Parrish  Location: [facility] Patient #: [id number] DOB: May 30, 1945 Undefined / Language: [name] / Race: White Female   History of Present Illness  The patient is a 70 year old female who presents with breast cancer. We are asked to see the patient in consultation by Dr. Isidore Moos to evaluate her for a new left breast cancer. The patient is a 70 year old white female who initially felt a mass in the upper outer left breast about a month ago. She showed this to her medical doctor who ordered the imaging. She has dense breast tissue. There was a mass seen in the upper outer quadrant that measured 2.7cm. 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 There was also an abnormal lymph node that was biopsied and positive. She does take hormone replacement and has been advised to stop this. She denies breast pain or discharge from the nipple.   Other Problems  Cholelithiasis Diabetes Mellitus High blood pressure Hypercholesterolemia  Past Surgical History  Breast Biopsy Left. Foot Surgery Right. Gallbladder Surgery - Laparoscopic Hysterectomy (not due to cancer) - Complete  Diagnostic Studies History  Colonoscopy 5-10 years ago Mammogram within last year Pap Smear 1-5 years ago  Medication History  Medications Reconciled  Social History  Alcohol use Occasional alcohol use. Caffeine use Carbonated beverages, Coffee, Tea. No drug use Tobacco use Never smoker.  Family History Diabetes Mellitus Mother. Heart Disease Mother. Heart disease in female family member before age 44 Hypertension Mother. Kidney Disease Mother. Thyroid problems Mother.  Pregnancy / Birth History Age at menarche 32 years. Contraceptive History Oral contraceptives. Gravida 2 Maternal age 21-25 Para 2    Review of Systems  General Not Present- Appetite Loss, Chills, Fatigue, Fever, Night Sweats, Weight Gain  and Weight Loss. Skin Not Present- Change in Wart/Mole, Dryness, Hives, Jaundice, New Lesions, Non-Healing Wounds, Rash and Ulcer. HEENT Not Present- Earache, Hearing Loss, Hoarseness, Nose Bleed, Oral Ulcers, Ringing in the Ears, Seasonal Allergies, Sinus Pain, Sore Throat, Visual Disturbances, Wears glasses/contact lenses and Yellow Eyes. Respiratory Not Present- Bloody sputum, Chronic Cough, Difficulty Breathing, Snoring and Wheezing. Breast Present- Breast Mass. Not Present- Breast Pain, Nipple Discharge and Skin Changes. Cardiovascular Not Present- Chest Pain, Difficulty Breathing Lying Down, Leg Cramps, Palpitations, Rapid Heart Rate, Shortness of Breath and Swelling of Extremities. Gastrointestinal Not Present- Abdominal Pain, Bloating, Bloody Stool, Change in Bowel Habits, Chronic diarrhea, Constipation, Difficulty Swallowing, Excessive gas, Gets full quickly at meals, Hemorrhoids, Indigestion, Nausea, Rectal Pain and Vomiting. Female Genitourinary Not Present- Frequency, Nocturia, Painful Urination, Pelvic Pain and Urgency. Musculoskeletal Not Present- Back Pain, Joint Pain, Joint Stiffness, Muscle Pain, Muscle Weakness and Swelling of Extremities. Neurological Not Present- Decreased Memory, Fainting, Headaches, Numbness, Seizures, Tingling, Tremor, Trouble walking and Weakness. Psychiatric Not Present- Anxiety, Bipolar, Change in Sleep Pattern, Depression, Fearful and Frequent crying. Endocrine Not Present- Cold Intolerance, Excessive Hunger, Hair Changes, Heat Intolerance, Hot flashes and New Diabetes. Hematology Not Present- Easy Bruising, Excessive bleeding, Gland problems, HIV and Persistent Infections.   Physical Exam General Mental Status-Alert. General Appearance-Consistent with stated age. Hydration-Well hydrated. Voice-Normal.  Head and Neck Head-normocephalic, atraumatic with no lesions or palpable masses. Trachea-midline. Thyroid Gland Characteristics -  normal size and consistency.  Eye Eyeball - Bilateral-Extraocular movements intact. Sclera/Conjunctiva - Bilateral-No scleral icterus.  Chest and Lung Exam Chest and lung exam reveals -quiet, even and easy respiratory effort with no use of accessory muscles and on auscultation,  normal breath sounds, no adventitious sounds and normal vocal resonance. Inspection Chest Wall - Normal. Back - normal.  Breast Note: There is a palpable 3cm mass in the upper outer left breast. There is no palpable mass in the right breast. There is no palpable axillary, supraclavicular, or cervical lymphadenopathy   Cardiovascular Cardiovascular examination reveals -normal heart sounds, regular rate and rhythm with no murmurs and normal pedal pulses bilaterally.  Abdomen Inspection Inspection of the abdomen reveals - No Hernias. Skin - Scar - no surgical scars. Palpation/Percussion Palpation and Percussion of the abdomen reveal - Soft, Non Tender, No Rebound tenderness, No Rigidity (guarding) and No hepatosplenomegaly. Auscultation Auscultation of the abdomen reveals - Bowel sounds normal.  Neurologic Neurologic evaluation reveals -alert and oriented x 3 with no impairment of recent or remote memory. Mental Status-Normal.  Musculoskeletal Normal Exam - Left-Upper Extremity Strength Normal and Lower Extremity Strength Normal. Normal Exam - Right-Upper Extremity Strength Normal and Lower Extremity Strength Normal.  Lymphatic Head & Neck  General Head & Neck Lymphatics: Bilateral - Description - Normal. Axillary  General Axillary Region: Bilateral - Description - Normal. Tenderness - Non Tender. Femoral & Inguinal  Generalized Femoral & Inguinal Lymphatics: Bilateral - Description - Normal. Tenderness - Non Tender.    Assessment & Plan  BREAST CANCER OF UPPER-OUTER QUADRANT OF LEFT FEMALE BREAST (C50.412) Impression: The patient appears to have a clinical stage 2B left breast  cancer. I have talked to her in detail about the different options for treatment and at this point because she is a triple positive we would recommend that she consider neoadjuvant chemotherapy. She would need a port for this. I have discussed with her the risks and benefits of the surgery as well as some of the technical aspects and she understands and wishes to proceed. She may also be a candidate for the Alliance trial. She will also get an MRI preoperatively. I will plan to see her periodically during her treatment to see how she is progressing. Current Plans Follow up with [place] in the office in 2 months.  Call us sooner as needed.  Pt Education - Breast Cancer: discussed with patient and provided information. Now for port placement

## 2015-09-05 NOTE — Transfer of Care (Signed)
Immediate Anesthesia Transfer of Care Note  Patient: Kristine Parrish  Procedure(s) Performed: Procedure(s): INSERTION PORT-A-CATH (N/A)  Patient Location: PACU  Anesthesia Type:General  Level of Consciousness: awake, alert , oriented and patient cooperative  Airway & Oxygen Therapy: Patient Spontanous Breathing and Patient connected to nasal cannula oxygen  Post-op Assessment: Report given to RN, Post -op Vital signs reviewed and stable and Patient moving all extremities  Post vital signs: Reviewed and stable  Last Vitals:  Filed Vitals:   09/05/15 0607 09/05/15 0834  BP: 131/66   Pulse: 69   Temp: 36.6 C 36.5 C  Resp: 20     Last Pain: There were no vitals filed for this visit.       Complications: No apparent anesthesia complications

## 2015-09-05 NOTE — Interval H&P Note (Signed)
History and Physical Interval Note:  09/05/2015 7:30 AM  Kristine Parrish  has presented today for surgery, with the diagnosis of LEFT BREAST CANCER  The various methods of treatment have been discussed with the patient and family. After consideration of risks, benefits and other options for treatment, the patient has consented to  Procedure(s): INSERTION PORT-A-CATH (N/A) as a surgical intervention .  The patient's history has been reviewed, patient examined, no change in status, stable for surgery.  I have reviewed the patient's chart and labs.  Questions were answered to the patient's satisfaction.     TOTH III,Stephonie Wilcoxen S

## 2015-09-05 NOTE — Op Note (Signed)
09/05/2015  8:32 AM  PATIENT:  Kristine Parrish  70 y.o. female  PRE-OPERATIVE DIAGNOSIS:  LEFT BREAST CANCER  POST-OPERATIVE DIAGNOSIS:  Left breast cancer  PROCEDURE:  Procedure(s): INSERTION PORT-A-CATH (N/A) R subclavian  SURGEON:  Surgeon(s) and Role:    * Jovita Kussmaul, MD - Primary  PHYSICIAN ASSISTANT:   ASSISTANTS: none   ANESTHESIA:   general  EBL:     BLOOD ADMINISTERED:none  DRAINS: none   LOCAL MEDICATIONS USED:  MARCAINE     SPECIMEN:  No Specimen  DISPOSITION OF SPECIMEN:  N/A  COUNTS:  YES  TOURNIQUET:  * No tourniquets in log *  DICTATION: .Dragon Dictation   After informed consent was obtained the patient was brought to the operating room and placed in the supine position on the operating room table. After adequate induction of general anesthesia the patient's right chest and neck area were prepped with ChloraPrep, allowed to dry, and draped in usual sterile manner. A roll was placed between the patient's shoulder blades to extend the shoulder slightly. An appropriate timeout was performed. The patient was placed in Trendelenburg position. The area lateral to the bend of the clavicle on the right chest wall was infiltrated with quarter percent Marcaine.  A large bore needle from the Port-A-Cath kit was used to slide beneath the bend of the clavicle heading towards the sternal notch and in doing so I was able to access the right subclavian vein without difficulty.  A wire was fed through the needle using the Seldinger technique without difficulty. The wire was confirmed in the central venous system using real-time fluoroscopy. Next an incision was made on the right chest wall  With a 15 blade knife. The incision was carried through the skin and subcutaneous tissue sharply with electrocautery. A subcutaneous pocket was created inferior to the incision and using blunt finger dissection.  Next the tubing was placed on the reservoir. The reservoir was placed in the  pocket and the length of the tubing was estimated using real-time fluoroscopy. Next the sheath and dilator were fed over the wire using the Seldinger technique without difficulty. The wire and dilator were removed. The tubing was fed through the sheath as far as it would go and then held in place while the sheath was gently cracked and separated.  Another real-time fluoroscopy image showed  The tip of the catheter to be in the distal superior vena cava. The tubing was then permanently anchored to the reservoir. The reservoir was anchored in the pocket with 2 2-0 Prolene stitches. The port was then aspirated and it aspirated blood easily. The port was then flushed initially with a dilute heparin solution and then with a more concentrated heparin solution. The port flushed very easily. The subcutaneous tissue was then closed over the port with interrupted 3-0 Vicryl stitches. The skin was then closed with a running 4-0 Monocryl Subcuticular stitch. Dermabond dressings were applied. The patient tolerated the procedure well. At the end of the case all needle sponge and instrument counts were correct. The patient was then awakened and taken to recovery in stable condition.  PLAN OF CARE: Discharge to home after PACU  PATIENT DISPOSITION:  PACU - hemodynamically stable.   Delay start of Pharmacological VTE agent (>24hrs) due to surgical blood loss or risk of bleeding: not applicable

## 2015-09-05 NOTE — Anesthesia Postprocedure Evaluation (Signed)
Anesthesia Post Note  Patient: Kristine Parrish  Procedure(s) Performed: Procedure(s) (LRB): INSERTION PORT-A-CATH (N/A)  Patient location during evaluation: PACU Anesthesia Type: General Level of consciousness: awake and alert Pain management: pain level controlled Vital Signs Assessment: post-procedure vital signs reviewed and stable Respiratory status: spontaneous breathing, nonlabored ventilation and respiratory function stable Cardiovascular status: blood pressure returned to baseline and stable Postop Assessment: no signs of nausea or vomiting Anesthetic complications: no    Last Vitals:  Filed Vitals:   09/05/15 0930 09/05/15 0949  BP: 114/61 126/74  Pulse: 83 77  Temp:    Resp: 11 16    Last Pain:  Filed Vitals:   09/05/15 0951  PainSc: 0-No pain                 Mar Walmer,W. EDMOND

## 2015-09-06 ENCOUNTER — Telehealth: Payer: Self-pay | Admitting: Oncology

## 2015-09-06 ENCOUNTER — Ambulatory Visit (HOSPITAL_BASED_OUTPATIENT_CLINIC_OR_DEPARTMENT_OTHER): Payer: BLUE CROSS/BLUE SHIELD | Admitting: Oncology

## 2015-09-06 ENCOUNTER — Encounter (HOSPITAL_COMMUNITY): Payer: Self-pay | Admitting: General Surgery

## 2015-09-06 VITALS — BP 138/74 | HR 103 | Temp 98.7°F | Resp 17 | Wt 172.1 lb

## 2015-09-06 DIAGNOSIS — C50412 Malignant neoplasm of upper-outer quadrant of left female breast: Secondary | ICD-10-CM

## 2015-09-06 DIAGNOSIS — C773 Secondary and unspecified malignant neoplasm of axilla and upper limb lymph nodes: Secondary | ICD-10-CM

## 2015-09-06 DIAGNOSIS — Z170 Estrogen receptor positive status [ER+]: Secondary | ICD-10-CM | POA: Diagnosis not present

## 2015-09-06 MED ORDER — LIDOCAINE-PRILOCAINE 2.5-2.5 % EX CREA: TOPICAL_CREAM | CUTANEOUS | Status: DC

## 2015-09-06 MED ORDER — PROCHLORPERAZINE MALEATE 10 MG PO TABS: 10.0000 mg | ORAL_TABLET | Freq: Four times a day (QID) | ORAL | Status: DC | PRN

## 2015-09-06 MED ORDER — DEXAMETHASONE 4 MG PO TABS: 8.0000 mg | ORAL_TABLET | Freq: Two times a day (BID) | ORAL | Status: DC

## 2015-09-06 NOTE — Progress Notes (Signed)
Kristine Parrish  [phone] Fax:([phone]) [phone]     ID: Kristine Parrish DOB: 1945-03-15  MR#: [id number]  JYN#:[id number]  Patient Care Team: Orpah Melter, MD as PCP - General (Family Medicine) Autumn Messing III, MD as Consulting Physician (General Surgery) Chauncey Cruel, MD as Consulting Physician (Oncology) Eppie Gibson, MD as Attending Physician (Radiation Oncology) Murvin Donning, MD (Dermatology) Laurence Spates, MD as Consulting Physician (Gastroenterology) Rosemary Holms, DPM as Consulting Physician (Podiatry) Viona Gilmore Evette Cristal, MD as Consulting Physician (Obstetrics and Gynecology) Larey Dresser, MD as Consulting Physician (Cardiology) Benson Norway, RN as Registered Nurse PCP: Orpah Melter, MD OTHER MD:  CHIEF COMPLAINT: Estrogen receptor positive breast cancer  CURRENT TREATMENT: Neoadjuvant chemotherapy   BREAST CANCER HISTORY: From the original intake note  Fl[place]ia herself noted a change in her left breast and brought it to the attention of Dr. Nori Riis, who set her up for bilateral diagnostic mammography with tomography and left breast ultrasonography at the [facility] 08/09/2015. The breast density was category D. In the area of palpable concern there was a mass with irregular margins and architectural distortion, measuring approximately 3 cm. This was palpable in the upper outer quadrant near the nipple. The ultrasound confirmed an irregular hypoechoic mass approximately 3 cm from the nipple at the 10:00 position measuring 2.7 cm. Ultrasound of the left axilla found a single level I left axillary node with focal cortical thickening.  On 08/13/2015 the patient underwent biopsy of the left breast mass and the suspicious left axillary lymph node, both showing invasive ductal carcinoma, grade 2 or 3, estrogen receptor 100% positive, progesterone receptor 100% positive, both with strong staining intensity, with an MIB-1 of 50%, and HER-2 amplification,  the signals ratio being 2.2 to and the number per cell 3.55  The patient's subsequent history is as detailed below  INTERVAL HISTORY: Kristine Parrish returns today for follow-up of her stage II breast cancer which we are going to be treating neoadjuvantly. Since her last visit here she had her port placed. That went well, with minimal pain. She had no bleeding swelling or erythema. She also came to chemotherapy school which she found helpful. She is scheduled for an echocardiogram 09/09/2015.  REVIEW OF SYSTEMS: She tells me her sugars are well controlled. She bruises easily. She is having fewer hot flashes since she started the venlafaxine, which she takes at a very low dose. A detailed review of systems today was otherwise stable.  PAST MEDICAL HISTORY: Past Medical History  Diagnosis Date  . Type 2 diabetes mellitus ([place])   . Peripheral neuropathy ([place])   . Peptic ulcer disease   . Hiatal hernia   . Colon polyps   . Breast cancer of upper-outer quadrant of left female breast ([place]) 08/15/2015  . Hypertension   . History of bronchitis   . GERD (gastroesophageal reflux disease)     PAST SURGICAL HISTORY: Past Surgical History  Procedure Laterality Date  . Vaginal hysterectomy  1995  . Cholecystectomy  1996  . Nerve removed from left foot  2000  . Appendectomy  1974  . Cataract surgery  04/2012  . Tubal ligation    . Foot surgery      left bunion removed  . Colonoscopy    . Portacath placement N/A 09/05/2015    Procedure: INSERTION PORT-A-CATH;  Surgeon: Autumn Messing III, MD;  Location: [facility];  Service: General;  Laterality: N/A;    FAMILY HISTORY Family History  Problem Relation Age of  Onset  . Diabetes Mother   . Hypertension Mother   . Alcoholism Maternal Grandfather   . Congestive Heart Failure Maternal Grandmother   The patient has little information about her father and is not sure of his cause of death or age at death. The patient's mother died at age 73. The patient had no  siblings. She was raised by her grandmother. She is not aware of any breast or ovarian cancer history in the family   GYNECOLOGIC HISTORY:  No LMP recorded. Patient has had a hysterectomy.  menarche age 70, first live birth age 70. The patient is GX P2. She underwent hysterectomy with bilateral salpingo-oophorectomy in 1994, and has been on estrogen replacement since that time, discontinued June 2017.   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 She is divorced and lives alone, with 2 cats. Daughter Kristine Parrish lives in [place] and works as a Teacher, music for the  [employer]. Son Kristine Parrish lives in [place] where he is Secondary school teacher. The patient had one grandchild. She attends [employer]. The patient's daughter Kristine Parrish is her healthcare power of attorney.     HEALTH MAINTENANCE: Social History  Substance Use Topics  . Smoking status: Never Smoker   . Smokeless tobacco: Never Used  . Alcohol Use: No     Colonoscopy: 2012/[facility]  PAP:  Bone density: 2015/"normal" at Dr. Verlon Au  Lipid panel:  No Known Allergies  Current Outpatient Prescriptions  Medication Sig Dispense Refill  . aspirin 81 MG tablet Take 81 mg by mouth daily.    Marland Kitchen gabapentin (NEURONTIN) 600 MG tablet TAKE ONE TABLET BY MOUTH THREE TIMES DAILY  90 tablet 5  . hydrochlorothiazide (MICROZIDE) 12.5 MG capsule Take 12.5 mg by mouth daily.    Marland Kitchen HYDROcodone-acetaminophen (NORCO) 5-325 MG tablet Take 1-2 tablets by mouth every 6 (six) hours as needed. 30 tablet 0  . lovastatin (MEVACOR) 20 MG tablet Take 20 mg by mouth at bedtime.    . Multiple Vitamin (MULTIVITAMIN) tablet Take 1 tablet by mouth daily.    Marland Kitchen omeprazole (PRILOSEC) 20 MG capsule Take 20 mg by mouth daily.    . Probiotic Product (PROBIOTIC DAILY PO) Take by mouth. Reported on 08/21/2015    . venlafaxine XR (EFFEXOR-XR) 37.5 MG 24 hr  capsule Take 1 capsule (37.5 mg total) by mouth daily with breakfast. 30 capsule 4   No current facility-administered medications for this visit.    OBJECTIVE: Elderly white woman in no acute distress   Filed Vitals:   09/06/15 1423  BP: 138/74  Pulse: 103  Temp: 98.7 F (37.1 C)  Resp: 17     Body mass index is 28.64 kg/(m^2).    ECOG FS:1 - Symptomatic but completely ambulatory  Sclerae unicteric, pupils round and equal Oropharynx clear and moist-- no thrush or other lesions No cervical or supraclavicular adenopathy Lungs no rales or rhonchi Heart regular rate and rhythm Abd soft, nontender, positive bowel sounds MSK no focal spinal tenderness, no upper extremity lymphedema Neuro: nonfocal, well oriented, appropriate affect Breasts: Deferred Skin: Port is in right upper anterior chest wall, with no erythema or dehiscence. There is no tenderness to palpation.  LAB RESULTS:  CMP     Component Value Date/Time   NA 139 09/03/2015 1542   NA 138 08/21/2015 1220   K 3.3* 09/03/2015 1542   K 3.1* 08/21/2015 1220   CL  102 09/03/2015 1542   CO2 28 09/03/2015 1542   CO2 24 08/21/2015 1220   GLUCOSE 161* 09/03/2015 1542   GLUCOSE 182* 08/21/2015 1220   BUN 7 09/03/2015 1542   BUN 8.4 08/21/2015 1220   CREATININE 0.76 09/03/2015 1542   CREATININE 0.9 08/21/2015 1220   CALCIUM 9.1 09/03/2015 1542   CALCIUM 9.0 08/21/2015 1220   PROT 7.3 08/21/2015 1220   PROT 7.0 03/16/2013 0831   ALBUMIN 3.4* 08/21/2015 1220   AST 24 08/21/2015 1220   ALT 29 08/21/2015 1220   ALKPHOS 148 08/21/2015 1220   BILITOT 0.35 08/21/2015 1220   GFRNONAA >60 09/03/2015 1542   GFRAA >60 09/03/2015 1542    INo results found for: SPEP, UPEP  Lab Results  Component Value Date   WBC 7.3 09/03/2015   NEUTROABS 5.6 08/21/2015   HGB 11.9* 09/03/2015   HCT 37.3 09/03/2015   MCV 86.7 09/03/2015   PLT 231 09/03/2015      Chemistry      Component Value Date/Time   NA 139 09/03/2015 1542    NA 138 08/21/2015 1220   K 3.3* 09/03/2015 1542   K 3.1* 08/21/2015 1220   CL 102 09/03/2015 1542   CO2 28 09/03/2015 1542   CO2 24 08/21/2015 1220   BUN 7 09/03/2015 1542   BUN 8.4 08/21/2015 1220   CREATININE 0.76 09/03/2015 1542   CREATININE 0.9 08/21/2015 1220      Component Value Date/Time   CALCIUM 9.1 09/03/2015 1542   CALCIUM 9.0 08/21/2015 1220   ALKPHOS 148 08/21/2015 1220   AST 24 08/21/2015 1220   ALT 29 08/21/2015 1220   BILITOT 0.35 08/21/2015 1220       No results found for: LABCA2  No components found for: LABCA125  No results for input(s): INR in the last 168 hours.  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 LABS:  None obtained EXAM: BILATERAL BREAST MRI WITH AND WITHOUT CONTRAST TECHNIQUE: Multiplanar, multisequence MR images of both breasts were obtained prior to and following the intravenous administration of 14 ml of MultiHance. THREE-DIMENSIONAL MR IMAGE RENDERING ON INDEPENDENT WORKSTATION: Three-dimensional MR images were rendered by Parrish-processing of the original MR data on an independent workstation. The three-dimensional MR images were interpreted, and findings are reported in the following complete MRI report for this study. Three dimensional images were evaluated at the independent DynaCad workstation COMPARISON:  Previous exam(s). FINDINGS: Breast composition: c. Heterogeneous fibroglandular tissue. Background parenchymal enhancement: Minimal Right breast: No mass or abnormal enhancement. Left breast: In the anterior third of the upper outer quadrant left breast is a heterogeneously  enhancing irregular mass measuring 2.3 x 2.0 x 1.5 cm (AP by transverse by craniocaudal). The mass demonstrates a combination of washout and plateau kinetics. Biopsy clip artifact is present within the mass. There are no additional masses or suspicious areas of enhancement within the left breast. Lymph nodes: There is a 1.0 cm short axis level 1 left axillary lymph node with diffuse cortical thickening. This likely corresponds to the recently biopsied node with cortical thickening on ultrasound. There are no additional suspicious left axillary lymph nodes. Negative for right axillary and internal mammary chain lymphadenopathy. Ancillary findings:  None. IMPRESSION:  1. Solitary biopsy-proven malignancy in the upper outer quadrant, anterior third, of the left breast. This mass measures 2.3 x 2.0 x 1.5 cm on MRI. 2. Diffuse cortical thickening of a 1.0 cm level 1 left axillary lymph node. This likely corresponds to the recently biopsied lymph node using ultrasound guidance, showing metastatic ductal carcinoma at pathology. 3. No evidence of malignancy in the right breast. RECOMMENDATION: Treatment planning for known left breast cancer with left axillary nodal metastasis. BI-RADS CATEGORY  6: Known biopsy-proven malignancy. Electronically Signed   By: Curlene Dolphin M.D.   On: 08/26/2015 12:01   Dg Chest Port 1 View  09/05/2015  CLINICAL DATA:  Right Port-A-Cath insertion EXAM: PORTABLE CHEST 1 VIEW COMPARISON:  None. FINDINGS: Cardiomediastinal silhouette is unremarkable. No acute infiltrate or pulmonary edema. There is right subclavian Port-A-Cath with tip in SVC. No pneumothorax. IMPRESSION: Right subclavian Port-A-Cath with tip in SVC.  No pneumothorax. Electronically Signed   By: Lahoma Crocker M.D.   On: 09/05/2015 09:17   Mm Digital Diagnostic Unilat L  08/13/2015  CLINICAL DATA:  Parrish left breast ultrasound-guided core biopsy. EXAM: DIAGNOSTIC LEFT MAMMOGRAM Parrish ULTRASOUND BIOPSY COMPARISON:  Previous exam(s).  FINDINGS: Mammographic images were obtained following ultrasound guided biopsy of the palpable mass located within the left breast at the 2 o'clock position and the left axillary lymph node with focal cortical thickening. The ribbon shaped clip is in appropriate position associated with the left breast mass located at the 2 o'clock position. The spiral shaped HydroMARK clip is in appropriate position associated with the mildly prominent left axillary lymph node as visualized in the left MLO projection. IMPRESSION: Appropriate positioning of clips following left breast ultrasound-guided biopsies as discussed above. Final Assessment: Parrish Procedure Mammograms for Marker Placement Electronically Signed   By: Altamese Cabal M.D.   On: 08/13/2015 13:38   Dg Fluoro Guide Cv Line-no Report  09/05/2015  CLINICAL DATA:  FLOURO GUIDE CV LINE Fluoroscopy was utilized by the requesting physician.  No radiographic interpretation.   US Breast Ltd Uni Left Inc Axilla  08/13/2015  ADDENDUM REPORT: 08/13/2015 17:05 ADDENDUM: In the findings of the report, the 5th paragraph, 1st sentence should read: Targeted left breast ultrasound is performed showing a hypoechoic mass with irregular margins demonstrating equal acoustic characteristics at the 2 O'CLOCK POSITION approximately 3 cm from the nipple. Electronically Signed   By: Evangeline Dakin M.D.   On: 08/13/2015 17:05  08/13/2015  CLINICAL DATA:  70 year old with a palpable lump in the outer periareolar left breast, confirmed on recent clinical breast examination. Annual evaluation, right breast. EXAM: 2D DIGITAL DIAGNOSTIC BILATERAL MAMMOGRAM WITH CAD AND ADJUNCT TOMO ULTRASOUND LEFT BREAST COMPARISON:  Mammography 10/02/2014, 09/25/2013 and earlier. No prior ultrasound. ACR Breast Density Category d: The breast tissue is extremely dense, which lowers the sensitivity of mammography. FINDINGS: Standard 2D and tomosynthesis CC and MLO views of both breasts and a standard 2D  spot tangential view of the area palpable concern in the left breast were obtained. Corresponding to the area of palpable concern is a mass with irregular margins, associated with architectural distortion and scattered microcalcifications in the outer left breast, anterior depth. The mass measures approximately 3 cm maximally. No findings suspicious for malignancy in the right breast. Mammographic images were processed with CAD. On physical exam, there is a firm palpable 2-3 cm mass in the upper outer periareolar left breast. Targeted left breast ultrasound is performed, showing a hypoechoic mass with irregular margins demonstrating equal acoustic characteristics  at the 10 o'clock position approximately 3 cm from the nipple measuring approximately 2.6 x 1.9 x 2.7 cm, demonstrating internal power Doppler flow. This corresponds to the area of palpable concern and the mammographic abnormality. Sonographic evaluation of the left axilla demonstrates a solitary level 1 low left axillary node with focal cortical thickening up to 4 mm. IMPRESSION: 1. Highly suspicious approximate 2.7 cm mass in the upper outer periareolar left breast accounting for the area of palpable concern. 2. Focal cortical thickening involving a level 1 low left axillary lymph node. 3. No mammographic evidence of malignancy, right breast. RECOMMENDATION: Ultrasound-guided core needle biopsy of the left breast mass and the left axillary lymph node with focal cortical thickening. The ultrasound biopsy procedure was discussed with the patient and her questions were answered. She has agreed to proceed and this has been scheduled for Tuesday, June 6 at 11:30 a.m. I have discussed the findings and recommendations with the patient. Results were also provided in writing at the conclusion of the visit. If applicable, a reminder letter will be sent to the patient regarding the next appointment. BI-RADS CATEGORY  5: Highly suggestive of malignancy.  Electronically Signed: By: Evangeline Dakin M.D. On: 08/09/2015 15:14   Mm Diag Breast Tomo Bilateral  08/13/2015  ADDENDUM REPORT: 08/13/2015 17:05 ADDENDUM: In the findings of the report, the 5th paragraph, 1st sentence should read: Targeted left breast ultrasound is performed showing a hypoechoic mass with irregular margins demonstrating equal acoustic characteristics at the 2 O'CLOCK POSITION approximately 3 cm from the nipple. Electronically Signed   By: Evangeline Dakin M.D.   On: 08/13/2015 17:05  08/13/2015  CLINICAL DATA:  70 year old with a palpable lump in the outer periareolar left breast, confirmed on recent clinical breast examination. Annual evaluation, right breast. EXAM: 2D DIGITAL DIAGNOSTIC BILATERAL MAMMOGRAM WITH CAD AND ADJUNCT TOMO ULTRASOUND LEFT BREAST COMPARISON:  Mammography 10/02/2014, 09/25/2013 and earlier. No prior ultrasound. ACR Breast Density Category d: The breast tissue is extremely dense, which lowers the sensitivity of mammography. FINDINGS: Standard 2D and tomosynthesis CC and MLO views of both breasts and a standard 2D spot tangential view of the area palpable concern in the left breast were obtained. Corresponding to the area of palpable concern is a mass with irregular margins, associated with architectural distortion and scattered microcalcifications in the outer left breast, anterior depth. The mass measures approximately 3 cm maximally. No findings suspicious for malignancy in the right breast. Mammographic images were processed with CAD. On physical exam, there is a firm palpable 2-3 cm mass in the upper outer periareolar left breast. Targeted left breast ultrasound is performed, showing a hypoechoic mass with irregular margins demonstrating equal acoustic characteristics at the 10 o'clock position approximately 3 cm from the nipple measuring approximately 2.6 x 1.9 x 2.7 cm, demonstrating internal power Doppler flow. This corresponds to the area of palpable concern  and the mammographic abnormality. Sonographic evaluation of the left axilla demonstrates a solitary level 1 low left axillary node with focal cortical thickening up to 4 mm. IMPRESSION: 1. Highly suspicious approximate 2.7 cm mass in the upper outer periareolar left breast accounting for the area of palpable concern. 2. Focal cortical thickening involving a level 1 low left axillary lymph node. 3. No mammographic evidence of malignancy, right breast. RECOMMENDATION: Ultrasound-guided core needle biopsy of the left breast mass and the left axillary lymph node with focal cortical thickening. The ultrasound biopsy procedure was discussed with the patient and her questions were answered. She has  agreed to proceed and this has been scheduled for Tuesday, June 6 at 11:30 a.m. I have discussed the findings and recommendations with the patient. Results were also provided in writing at the conclusion of the visit. If applicable, a reminder letter will be sent to the patient regarding the next appointment. BI-RADS CATEGORY  5: Highly suggestive of malignancy. Electronically Signed: By: Evangeline Dakin M.D. On: 08/09/2015 15:14   [place] Lt Breast Bx W Loc Dev 1st Lesion Img Bx Spec US Guide  08/16/2015  ADDENDUM REPORT: 08/14/2015 16:01 ADDENDUM: Pathology revealed grade II to III invasive ductal carcinoma in the left breast. The left axillary lymph node was positive for metastatic ductal carcinoma. This was found to be concordant by Dr. Luberta Robertson. Pathology results were discussed with the patient by telephone. The patient reported doing well after the biopsy with tenderness at the site. Parrish biopsy instructions and care were reviewed and questions were answered. The patient was encouraged to call [facility] for any additional concerns. The patient was referred to the [facility] at the [facility] on August 21, 2015. Pathology results reported by  Susa Raring RN, BSN on 08/14/2015. Electronically Signed   By: Altamese Cabal M.D.   On: 08/14/2015 16:01  08/16/2015  CLINICAL DATA:  Left breast mass with mildly prominent left axillary lymph node. EXAM: ULTRASOUND GUIDED LEFT BREAST CORE NEEDLE BIOPSY COMPARISON:  Previous exam(s). FINDINGS: I met with the patient and we discussed the procedure of ultrasound-guided biopsy, including benefits and alternatives. We discussed the high likelihood of a successful procedure. We discussed the risks of the procedure, including infection, bleeding, tissue injury, clip migration, and inadequate sampling. Informed written consent was given. The usual time-out protocol was performed immediately prior to the procedure. Using sterile technique and 1% Lidocaine as local anesthetic, under direct ultrasound visualization, a 14 gauge spring-loaded device was used to perform biopsy of the palpable mass located within the left breast at the 2 o'clock position using a lateral approach. At the conclusion of the procedure a ribbon shaped tissue marker clip was deployed into the biopsy cavity. Follow up 2 view mammogram was performed and dictated separately. IMPRESSION: Ultrasound guided biopsy of the palpable mass located within the left breast at the 2 o'clock position as discussed above. No apparent complications. Electronically Signed: By: Altamese Cabal M.D. On: 08/13/2015 13:34   [place] Lt Breast Bx W Loc Dev Ea Add Lesion Img Bx Spec US Guide  08/16/2015  ADDENDUM REPORT: 08/14/2015 16:02 ADDENDUM: Pathology revealed grade II to III invasive ductal carcinoma in the left breast. The left axillary lymph node was positive for metastatic ductal carcinoma. This was found to be concordant by Dr. Luberta Robertson. Pathology results were discussed with the patient by telephone. The patient reported doing well after the biopsy with tenderness at the site. Parrish biopsy instructions and care were reviewed and questions were answered. The  patient was encouraged to call [facility] for any additional concerns. The patient was referred to the [facility] at the [facility] on August 21, 2015. Pathology results reported by Susa Raring RN, BSN on 08/14/2015. Electronically Signed   By: Altamese Cabal M.D.   On: 08/14/2015 16:02  08/16/2015  CLINICAL DATA:  Palpable left breast mass and left axillary lymph node with focal cortical thickening. EXAM: ULTRASOUND GUIDED LEFT BREAST CORE NEEDLE BIOPSY COMPARISON:  Previous exam(s). FINDINGS:  I met with the patient and we discussed the procedure of ultrasound-guided biopsy, including benefits and alternatives. We discussed the high likelihood of a successful procedure. We discussed the risks of the procedure, including infection, bleeding, tissue injury, clip migration, and inadequate sampling. Informed written consent was given. The usual time-out protocol was performed immediately prior to the procedure. Using sterile technique and 1% Lidocaine as local anesthetic, under direct ultrasound visualization, a 14 gauge spring-loaded device was used to perform biopsy of the left axillary lymph node with focal cortical thickening using a lateral approach. At the conclusion of the procedure a HydroMARK tissue marker clip was deployed into the biopsy cavity. Follow up 2 view mammogram was performed and dictated separately. IMPRESSION: Ultrasound guided biopsy of the left axillary lymph node with mild focal cortical thickening as discussed above. No apparent complications. Electronically Signed: By: Altamese Cabal M.D. On: 08/13/2015 13:36    ELIGIBLE FOR AVAILABLE RESEARCH PROTOCOL: PALLAS, ALLIANCE  ASSESSMENT: 70 y.o. [place] woman status Parrish left breast upper outer quadrant and left axillary lymph node biopsy 08/13/2015 both positive for an invasive ductal carcinoma, grade 2 or 3, triple positive, with an MIB-1 of 50%  (1)  neoadjuvant chemotherapy will consist of carboplatin, docetaxel, trastuzumab and pertuzumab given every 21 days 6, beginning 09/11/2015  (2) trastuzumab will be continued to complete a year  (3) definitive surgery to follow with TAD and consideration of the Alliance trial  (4) adjuvant radiation to follow as appropriate  (5) anti-estrogens to follow, with consideration of the PALLAS trial  PLAN: We spent approximately 40 minutes today going over her situation. I showed her images of her MRI, which shows essentially what we saw previously by mammography and ultrasonography. There was no additional mass and there were no other lymph nodes noted of concern.  We then reviewed how to take her nausea and other supportive metastases. I gave her a "roadmap" on how to take these medications and went to take them. The prescriptions were placed. She understands that these may have side effects of their own, but fully we will be using them very short-term and they should not be a major issue for her.  Because of her work schedule, it will be much better for her to be treated on Thursdays or Fridays (which is when she is in [place]) so beginning with the second cycle as she will be treated on Thursdays.  I urged her to go ahead and start working on a wigt. She can expect to lose her hair in 2 or 2-1/2 weeks.  Her first dose will be next Wednesday, assuming her echocardiogram is normal as expected on 09/09/2015. She is going to see me a week after her first cycle to troubleshoot any problems that may develop and I have asked her to keep a diary. Of course she knows to call for any problems that may develop before that visit.  Chauncey Cruel, MD   09/06/2015 2:32 PM Medical Oncology and Hematology [facility] [address] [place], [place] [address] [address] Tel. [phone]    Fax. [phone]

## 2015-09-06 NOTE — Telephone Encounter (Signed)
Gave and printed appt sched and avs for pt for July  °

## 2015-09-09 ENCOUNTER — Ambulatory Visit (HOSPITAL_COMMUNITY)
Admission: RE | Admit: 2015-09-09 | Discharge: 2015-09-09 | Disposition: A | Discharge status: Home / Self Care | Payer: BLUE CROSS/BLUE SHIELD | Source: Ambulatory Visit | Attending: Oncology | Admitting: Oncology

## 2015-09-09 ENCOUNTER — Other Ambulatory Visit: Payer: Self-pay

## 2015-09-09 DIAGNOSIS — I313 Pericardial effusion (noninflammatory): Secondary | ICD-10-CM | POA: Insufficient documentation

## 2015-09-09 DIAGNOSIS — Z09 Encounter for follow-up examination after completed treatment for conditions other than malignant neoplasm: Secondary | ICD-10-CM | POA: Diagnosis present

## 2015-09-09 DIAGNOSIS — M2011 Hallux valgus (acquired), right foot: Secondary | ICD-10-CM | POA: Diagnosis not present

## 2015-09-09 DIAGNOSIS — C50412 Malignant neoplasm of upper-outer quadrant of left female breast: Secondary | ICD-10-CM | POA: Diagnosis not present

## 2015-09-09 DIAGNOSIS — M79605 Pain in left leg: Secondary | ICD-10-CM | POA: Diagnosis not present

## 2015-09-09 LAB — ECHOCARDIOGRAM COMPLETE
E decel time: 190 msec
E/e' ratio: 9.48
FS: 25 % — AB (ref 28–44)
IVS/LV PW RATIO, ED: 1.06
LA ID, A-P, ES: 31 mm
LA diam end sys: 31 mm
LA diam index: 1.67 cm/m2
LA vol A4C: 36.7 ml
LV E/e' medial: 9.48
LV E/e'average: 9.48
LV PW d: 10.7 mm — AB (ref 0.6–1.1)
LV e' LATERAL: 8.7 cm/s
LVOT SV: 68 mL
LVOT VTI: 21.8 cm
LVOT area: 3.14 cm2
LVOT diameter: 20 mm
LVOT peak vel: 97.5 cm/s
MV Dec: 190
MV Peak grad: 3 mmHg
MV pk A vel: 113 m/s
MV pk E vel: 82.5 m/s
TAPSE: 21.2 mm
TDI e' lateral: 8.7
TDI e' medial: 6.85

## 2015-09-09 NOTE — Progress Notes (Signed)
Echocardiogram 2D Echocardiogram has been performed.  Joelene Millin 09/09/2015, 11:11 AM

## 2015-09-11 ENCOUNTER — Other Ambulatory Visit: Payer: BLUE CROSS/BLUE SHIELD

## 2015-09-11 ENCOUNTER — Other Ambulatory Visit (HOSPITAL_BASED_OUTPATIENT_CLINIC_OR_DEPARTMENT_OTHER): Payer: BLUE CROSS/BLUE SHIELD

## 2015-09-11 ENCOUNTER — Ambulatory Visit (HOSPITAL_BASED_OUTPATIENT_CLINIC_OR_DEPARTMENT_OTHER): Payer: BLUE CROSS/BLUE SHIELD

## 2015-09-11 ENCOUNTER — Ambulatory Visit (HOSPITAL_BASED_OUTPATIENT_CLINIC_OR_DEPARTMENT_OTHER): Payer: BLUE CROSS/BLUE SHIELD | Admitting: Nurse Practitioner

## 2015-09-11 ENCOUNTER — Ambulatory Visit: Payer: BLUE CROSS/BLUE SHIELD

## 2015-09-11 ENCOUNTER — Other Ambulatory Visit: Payer: Self-pay | Admitting: *Deleted

## 2015-09-11 ENCOUNTER — Other Ambulatory Visit: Payer: Self-pay | Admitting: Nurse Practitioner

## 2015-09-11 VITALS — BP 144/76 | HR 67 | Temp 97.6°F | Resp 16

## 2015-09-11 DIAGNOSIS — T7840XA Allergy, unspecified, initial encounter: Secondary | ICD-10-CM

## 2015-09-11 DIAGNOSIS — T451X5A Adverse effect of antineoplastic and immunosuppressive drugs, initial encounter: Secondary | ICD-10-CM | POA: Diagnosis not present

## 2015-09-11 DIAGNOSIS — C50412 Malignant neoplasm of upper-outer quadrant of left female breast: Secondary | ICD-10-CM | POA: Diagnosis not present

## 2015-09-11 DIAGNOSIS — Z5111 Encounter for antineoplastic chemotherapy: Secondary | ICD-10-CM

## 2015-09-11 DIAGNOSIS — Z5112 Encounter for antineoplastic immunotherapy: Secondary | ICD-10-CM

## 2015-09-11 DIAGNOSIS — R739 Hyperglycemia, unspecified: Secondary | ICD-10-CM | POA: Diagnosis not present

## 2015-09-11 LAB — CBC WITH DIFFERENTIAL/PLATELET
BASO%: 0.1 % (ref 0.0–2.0)
Basophils Absolute: 0 10*3/uL (ref 0.0–0.1)
EOS%: 0 % (ref 0.0–7.0)
Eosinophils Absolute: 0 10*3/uL (ref 0.0–0.5)
HCT: 36.9 % (ref 34.8–46.6)
HGB: 12 g/dL (ref 11.6–15.9)
LYMPH%: 11 % — ABNORMAL LOW (ref 14.0–49.7)
MCH: 27.7 pg (ref 25.1–34.0)
MCHC: 32.6 g/dL (ref 31.5–36.0)
MCV: 85.1 fL (ref 79.5–101.0)
MONO#: 0.4 10*3/uL (ref 0.1–0.9)
MONO%: 4.3 % (ref 0.0–14.0)
NEUT#: 8.2 10*3/uL — ABNORMAL HIGH (ref 1.5–6.5)
NEUT%: 84.6 % — ABNORMAL HIGH (ref 38.4–76.8)
Platelets: 268 10*3/uL (ref 145–400)
RBC: 4.34 10*6/uL (ref 3.70–5.45)
RDW: 14 % (ref 11.2–14.5)
WBC: 9.7 10*3/uL (ref 3.9–10.3)
lymph#: 1.1 10*3/uL (ref 0.9–3.3)

## 2015-09-11 LAB — COMPREHENSIVE METABOLIC PANEL
ALT: 37 U/L (ref 0–55)
AST: 24 U/L (ref 5–34)
Albumin: 3.5 g/dL (ref 3.5–5.0)
Alkaline Phosphatase: 137 U/L (ref 40–150)
Anion Gap: 12 mEq/L — ABNORMAL HIGH (ref 3–11)
BUN: 16.4 mg/dL (ref 7.0–26.0)
CO2: 24 mEq/L (ref 22–29)
Calcium: 9.6 mg/dL (ref 8.4–10.4)
Chloride: 102 mEq/L (ref 98–109)
Creatinine: 0.9 mg/dL (ref 0.6–1.1)
EGFR: 63 mL/min/{1.73_m2} — ABNORMAL LOW (ref >90)
Glucose: 341 mg/dl — ABNORMAL HIGH (ref 70–140)
Potassium: 3.8 mEq/L (ref 3.5–5.1)
Sodium: 138 mEq/L (ref 136–145)
Total Bilirubin: 0.36 mg/dL (ref 0.20–1.20)
Total Protein: 7.4 g/dL (ref 6.4–8.3)

## 2015-09-11 MED ORDER — PALONOSETRON HCL INJECTION 0.25 MG/5ML
0.2500 mg | Freq: Once | INTRAVENOUS | Status: AC
  Administered 2015-09-11: 0.25 mg via INTRAVENOUS

## 2015-09-11 MED ORDER — SODIUM CHLORIDE 0.9 % IV SOLN
354.4000 mg | Freq: Once | INTRAVENOUS | Status: AC
  Administered 2015-09-11: 350 mg via INTRAVENOUS
  Filled 2015-09-11: qty 35

## 2015-09-11 MED ORDER — PEGFILGRASTIM 6 MG/0.6ML ~~LOC~~ PSKT
6.0000 mg | PREFILLED_SYRINGE | Freq: Once | SUBCUTANEOUS | Status: AC
  Administered 2015-09-11: 6 mg via SUBCUTANEOUS
  Filled 2015-09-11: qty 0.6

## 2015-09-11 MED ORDER — PALONOSETRON HCL INJECTION 0.25 MG/5ML
INTRAVENOUS | Status: AC
  Filled 2015-09-11: qty 5

## 2015-09-11 MED ORDER — HEPARIN SOD (PORK) LOCK FLUSH 100 UNIT/ML IV SOLN
500.0000 [IU] | Freq: Once | INTRAVENOUS | Status: AC | PRN
  Administered 2015-09-11: 500 [IU]
  Filled 2015-09-11: qty 5

## 2015-09-11 MED ORDER — SODIUM CHLORIDE 0.9 % IV SOLN
10.0000 mg | Freq: Once | INTRAVENOUS | Status: AC
  Administered 2015-09-11: 10 mg via INTRAVENOUS
  Filled 2015-09-11: qty 1

## 2015-09-11 MED ORDER — LORAZEPAM 0.5 MG PO TABS: 0.5000 mg | ORAL_TABLET | Freq: Every evening | ORAL | Status: DC | PRN

## 2015-09-11 MED ORDER — METHYLPREDNISOLONE SODIUM SUCC 125 MG IJ SOLR
60.0000 mg | Freq: Once | INTRAMUSCULAR | Status: AC
  Administered 2015-09-11: 60 mg via INTRAVENOUS

## 2015-09-11 MED ORDER — DIPHENHYDRAMINE HCL 25 MG PO CAPS
ORAL_CAPSULE | ORAL | Status: AC
  Filled 2015-09-11: qty 1

## 2015-09-11 MED ORDER — DIPHENHYDRAMINE HCL 25 MG PO CAPS
25.0000 mg | ORAL_CAPSULE | Freq: Once | ORAL | Status: AC
  Administered 2015-09-11: 25 mg via ORAL

## 2015-09-11 MED ORDER — SODIUM CHLORIDE 0.9 % IV SOLN
840.0000 mg | Freq: Once | INTRAVENOUS | Status: AC
  Administered 2015-09-11: 840 mg via INTRAVENOUS
  Filled 2015-09-11: qty 28

## 2015-09-11 MED ORDER — ACETAMINOPHEN 325 MG PO TABS
ORAL_TABLET | ORAL | Status: AC
  Filled 2015-09-11: qty 2

## 2015-09-11 MED ORDER — SODIUM CHLORIDE 0.9 % IV SOLN
Freq: Once | INTRAVENOUS | Status: AC
  Administered 2015-09-11: 10:00:00 via INTRAVENOUS

## 2015-09-11 MED ORDER — SODIUM CHLORIDE 0.9% FLUSH
10.0000 mL | INTRAVENOUS | Status: DC | PRN
  Administered 2015-09-11: 10 mL
  Filled 2015-09-11: qty 10

## 2015-09-11 MED ORDER — SODIUM CHLORIDE 0.9 % IV SOLN
75.0000 mg/m2 | Freq: Once | INTRAVENOUS | Status: AC
  Administered 2015-09-11: 140 mg via INTRAVENOUS
  Filled 2015-09-11: qty 14

## 2015-09-11 MED ORDER — DIPHENHYDRAMINE HCL 50 MG/ML IJ SOLN
25.0000 mg | Freq: Once | INTRAMUSCULAR | Status: AC | PRN
  Administered 2015-09-11: 25 mg via INTRAVENOUS

## 2015-09-11 MED ORDER — FAMOTIDINE IN NACL 20-0.9 MG/50ML-% IV SOLN
20.0000 mg | Freq: Once | INTRAVENOUS | Status: AC | PRN
  Administered 2015-09-11: 20 mg via INTRAVENOUS

## 2015-09-11 MED ORDER — ACETAMINOPHEN 325 MG PO TABS
650.0000 mg | ORAL_TABLET | Freq: Once | ORAL | Status: AC
  Administered 2015-09-11: 650 mg via ORAL

## 2015-09-11 MED ORDER — TRASTUZUMAB CHEMO INJECTION 440 MG
8.0000 mg/kg | Freq: Once | INTRAVENOUS | Status: AC
  Administered 2015-09-11: 630 mg via INTRAVENOUS
  Filled 2015-09-11: qty 30

## 2015-09-11 NOTE — Progress Notes (Signed)
Per [facility], pre-authorization for treatment has been completed.   @ 1635 patient began to c/o chest "fullness."  Denies dyspnea or pain and/or other symptoms. Selena Lesser, NP notified. Selena Lesser came to treatment room to see patient. Orders received, repeated, confirmed, and executed. Report given to [facility], RN.

## 2015-09-11 NOTE — Progress Notes (Signed)
Patient states that the pressure that she felt in her chest has subsided.

## 2015-09-11 NOTE — Telephone Encounter (Signed)
This RN was able to discuss with pt while she was still in the treatment room regarding MD's request to hold any further decadron use. She is to use the compazine as prescribed. Prescription given for ativan.  Pt and daughter understand to call if nausea occurs.

## 2015-09-11 NOTE — Patient Instructions (Addendum)
[facility] Discharge Instructions for Patients Receiving Chemotherapy  Today you received the following chemotherapy agents:  Herceptin, Perjeta, Taxotere, and Carboplatin.  To help prevent nausea and vomiting after your treatment, we encourage you to take your nausea medication as directed.   If you develop nausea and vomiting that is not controlled by your nausea medication, call the clinic.   BELOW ARE SYMPTOMS THAT SHOULD BE REPORTED IMMEDIATELY:  *FEVER GREATER THAN 100.5 F  *CHILLS WITH OR WITHOUT FEVER  NAUSEA AND VOMITING THAT IS NOT CONTROLLED WITH YOUR NAUSEA MEDICATION  *UNUSUAL SHORTNESS OF BREATH  *UNUSUAL BRUISING OR BLEEDING  TENDERNESS IN MOUTH AND THROAT WITH OR WITHOUT PRESENCE OF ULCERS  *URINARY PROBLEMS  *BOWEL PROBLEMS  UNUSUAL RASH Items with * indicate a potential emergency and should be followed up as soon as possible.  Feel free to call the clinic you have any questions or concerns. The clinic phone number is [phone].  Please show the [facility] at check-in to the Emergency Department and triage nurse.  Trastuzumab injection for infusion What is this medicine? TRASTUZUMAB (tras TOO zoo mab) is a monoclonal antibody. It is used to treat breast cancer and stomach cancer. This medicine may be used for other purposes; ask your health care provider or pharmacist if you have questions. What should I tell my health care provider before I take this medicine? They need to know if you have any of these conditions: -heart disease -heart failure -infection (especially a virus infection such as chickenpox, cold sores, or herpes) -lung or breathing disease, like asthma -recent or ongoing radiation therapy -an unusual or allergic reaction to trastuzumab, benzyl alcohol, or other medications, foods, dyes, or preservatives -pregnant or trying to get pregnant -breast-feeding How should I use this medicine? This drug is given as an  infusion into a vein. It is administered in a hospital or clinic by a specially trained health care professional. Talk to your pediatrician regarding the use of this medicine in children. This medicine is not approved for use in children. Overdosage: If you think you have taken too much of this medicine contact a poison control center or emergency room at once. NOTE: This medicine is only for you. Do not share this medicine with others. What if I miss a dose? It is important not to miss a dose. Call your doctor or health care professional if you are unable to keep an appointment. What may interact with this medicine? -doxorubicin -warfarin This list may not describe all possible interactions. Give your health care provider a list of all the medicines, herbs, non-prescription drugs, or dietary supplements you use. Also tell them if you smoke, drink alcohol, or use illegal drugs. Some items may interact with your medicine. What should I watch for while using this medicine? Visit your doctor for checks on your progress. Report any side effects. Continue your course of treatment even though you feel ill unless your doctor tells you to stop. Call your doctor or health care professional for advice if you get a fever, chills or sore throat, or other symptoms of a cold or flu. Do not treat yourself. Try to avoid being around people who are sick. You may experience fever, chills and shaking during your first infusion. These effects are usually mild and can be treated with other medicines. Report any side effects during the infusion to your health care professional. Fever and chills usually do not happen with later infusions. Do not become pregnant while  taking this medicine or for 7 months after stopping it. Women should inform their doctor if they wish to become pregnant or think they might be pregnant. Women of child-bearing potential will need to have a negative pregnancy test before starting this medicine.  There is a potential for serious side effects to an unborn child. Talk to your health care professional or pharmacist for more information. Do not breast-feed an infant while taking this medicine or for 7 months after stopping it. Women must use effective birth control with this medicine. What side effects may I notice from receiving this medicine? Side effects that you should report to your doctor or other health care professional as soon as possible: -breathing difficulties -chest pain or palpitations -cough -dizziness or fainting -fever or chills, sore throat -skin rash, itching or hives -swelling of the legs or ankles -unusually weak or tired Side effects that usually do not require medical attention (report to your doctor or other health care professional if they continue or are bothersome): -loss of appetite -headache -muscle aches -nausea This list may not describe all possible side effects. Call your doctor for medical advice about side effects. You may report side effects to FDA at 1-800-FDA-1088. Where should I keep my medicine? This drug is given in a hospital or clinic and will not be stored at home. NOTE: This sheet is a summary. It may not cover all possible information. If you have questions about this medicine, talk to your doctor, pharmacist, or health care provider.    2016, [employer] Standard. (2014-06-01 11:49:32)  Pertuzumab injection What is this medicine? PERTUZUMAB (per TOOZ ue mab) is a monoclonal antibody. It is used to treat breast cancer. This medicine may be used for other purposes; ask your health care provider or pharmacist if you have questions. What should I tell my health care provider before I take this medicine? They need to know if you have any of these conditions: -heart disease -heart failure -high blood pressure -history of irregular heart beat -recent or ongoing radiation therapy -an unusual or allergic reaction to pertuzumab, other  medicines, foods, dyes, or preservatives -pregnant or trying to get pregnant -breast-feeding How should I use this medicine? This medicine is for infusion into a vein. It is given by a health care professional in a hospital or clinic setting. Talk to your pediatrician regarding the use of this medicine in children. Special care may be needed. Overdosage: If you think you have taken too much of this medicine contact a poison control center or emergency room at once. NOTE: This medicine is only for you. Do not share this medicine with others. What if I miss a dose? It is important not to miss your dose. Call your doctor or health care professional if you are unable to keep an appointment. What may interact with this medicine? Interactions are not expected. Give your health care provider a list of all the medicines, herbs, non-prescription drugs, or dietary supplements you use. Also tell them if you smoke, drink alcohol, or use illegal drugs. Some items may interact with your medicine. This list may not describe all possible interactions. Give your health care provider a list of all the medicines, herbs, non-prescription drugs, or dietary supplements you use. Also tell them if you smoke, drink alcohol, or use illegal drugs. Some items may interact with your medicine. What should I watch for while using this medicine? Your condition will be monitored carefully while you are receiving this medicine. Report any side effects.  Continue your course of treatment even though you feel ill unless your doctor tells you to stop. Do not become pregnant while taking this medicine or for 7 months after stopping it. Women should inform their doctor if they wish to become pregnant or think they might be pregnant. Women of child-bearing potential will need to have a negative pregnancy test before starting this medicine. There is a potential for serious side effects to an unborn child. Talk to your health care  professional or pharmacist for more information. Do not breast-feed an infant while taking this medicine or for 7 months after stopping it. Women must use effective birth control with this medicine. Call your doctor or health care professional for advice if you get a fever, chills or sore throat, or other symptoms of a cold or flu. Do not treat yourself. Try to avoid being around people who are sick. You may experience fever, chills, and headache during the infusion. Report any side effects during the infusion to your health care professional. What side effects may I notice from receiving this medicine? Side effects that you should report to your doctor or health care professional as soon as possible: -breathing problems -chest pain or palpitations -dizziness -feeling faint or lightheaded -fever or chills -skin rash, itching or hives -sore throat -swelling of the face, lips, or tongue -swelling of the legs or ankles -unusually weak or tired Side effects that usually do not require medical attention (Report these to your doctor or health care professional if they continue or are bothersome.): -diarrhea -hair loss -nausea, vomiting -tiredness This list may not describe all possible side effects. Call your doctor for medical advice about side effects. You may report side effects to FDA at 1-800-FDA-1088. Where should I keep my medicine? This drug is given in a hospital or clinic and will not be stored at home. NOTE: This sheet is a summary. It may not cover all possible information. If you have questions about this medicine, talk to your doctor, pharmacist, or health care provider.    2016, Elsevier/Gold Standard. (2014-06-01 16:07:57)  Docetaxel injection What is this medicine? DOCETAXEL (doe se TAX el) is a chemotherapy drug. It targets fast dividing cells, like cancer cells, and causes these cells to die. This medicine is used to treat many types of cancers like breast cancer, certain  stomach cancers, head and neck cancer, lung cancer, and prostate cancer. This medicine may be used for other purposes; ask your health care provider or pharmacist if you have questions. What should I tell my health care provider before I take this medicine? They need to know if you have any of these conditions: -infection (especially a virus infection such as chickenpox, cold sores, or herpes) -liver disease -low blood counts, like low white cell, platelet, or red cell counts -an unusual or allergic reaction to docetaxel, polysorbate 80, other chemotherapy agents, other medicines, foods, dyes, or preservatives -pregnant or trying to get pregnant -breast-feeding How should I use this medicine? This drug is given as an infusion into a vein. It is administered in a hospital or clinic by a specially trained health care professional. Talk to your pediatrician regarding the use of this medicine in children. Special care may be needed. Overdosage: If you think you have taken too much of this medicine contact a poison control center or emergency room at once. NOTE: This medicine is only for you. Do not share this medicine with others. What if I miss a dose? It is important not  to miss your dose. Call your doctor or health care professional if you are unable to keep an appointment. What may interact with this medicine? -cyclosporine -erythromycin -ketoconazole -medicines to increase blood counts like filgrastim, pegfilgrastim, sargramostim -vaccines Talk to your doctor or health care professional before taking any of these medicines: -acetaminophen -aspirin -ibuprofen -ketoprofen -naproxen This list may not describe all possible interactions. Give your health care provider a list of all the medicines, herbs, non-prescription drugs, or dietary supplements you use. Also tell them if you smoke, drink alcohol, or use illegal drugs. Some items may interact with your medicine. What should I watch for  while using this medicine? Your condition will be monitored carefully while you are receiving this medicine. You will need important blood work done while you are taking this medicine. This drug may make you feel generally unwell. This is not uncommon, as chemotherapy can affect healthy cells as well as cancer cells. Report any side effects. Continue your course of treatment even though you feel ill unless your doctor tells you to stop. In some cases, you may be given additional medicines to help with side effects. Follow all directions for their use. Call your doctor or health care professional for advice if you get a fever, chills or sore throat, or other symptoms of a cold or flu. Do not treat yourself. This drug decreases your body's ability to fight infections. Try to avoid being around people who are sick. This medicine may increase your risk to bruise or bleed. Call your doctor or health care professional if you notice any unusual bleeding. This medicine may contain alcohol in the product. You may get drowsy or dizzy. Do not drive, use machinery, or do anything that needs mental alertness until you know how this medicine affects you. Do not stand or sit up quickly, especially if you are an older patient. This reduces the risk of dizzy or fainting spells. Avoid alcoholic drinks. Do not become pregnant while taking this medicine. Women should inform their doctor if they wish to become pregnant or think they might be pregnant. There is a potential for serious side effects to an unborn child. Talk to your health care professional or pharmacist for more information. Do not breast-feed an infant while taking this medicine. What side effects may I notice from receiving this medicine? Side effects that you should report to your doctor or health care professional as soon as possible: -allergic reactions like skin rash, itching or hives, swelling of the face, lips, or tongue -low blood counts - This drug may  decrease the number of white blood cells, red blood cells and platelets. You may be at increased risk for infections and bleeding. -signs of infection - fever or chills, cough, sore throat, pain or difficulty passing urine -signs of decreased platelets or bleeding - bruising, pinpoint red spots on the skin, black, tarry stools, nosebleeds -signs of decreased red blood cells - unusually weak or tired, fainting spells, lightheadedness -breathing problems -fast or irregular heartbeat -low blood pressure -mouth sores -nausea and vomiting -pain, swelling, redness or irritation at the injection site -pain, tingling, numbness in the hands or feet -swelling of the ankle, feet, hands -weight gain Side effects that usually do not require medical attention (report to your prescriber or health care professional if they continue or are bothersome): -bone pain -complete hair loss including hair on your head, underarms, pubic hair, eyebrows, and eyelashes -diarrhea -excessive tearing -changes in the color of fingernails -loosening of the fingernails -  nausea -muscle pain -red flush to skin -sweating -weak or tired This list may not describe all possible side effects. Call your doctor for medical advice about side effects. You may report side effects to FDA at 1-800-FDA-1088. Where should I keep my medicine? This drug is given in a hospital or clinic and will not be stored at home. NOTE: This sheet is a summary. It may not cover all possible information. If you have questions about this medicine, talk to your doctor, pharmacist, or health care provider.    2016, [employer]vier/Gold Standard. (2014-03-12 16:04:57)  Carboplatin injection What is this medicine? CARBOPLATIN (KAR boe pla tin) is a chemotherapy drug. It targets fast dividing cells, like cancer cells, and causes these cells to die. This medicine is used to treat ovarian cancer and many other cancers. This medicine may be used for other  purposes; ask your health care provider or pharmacist if you have questions. What should I tell my health care provider before I take this medicine? They need to know if you have any of these conditions: -blood disorders -hearing problems -kidney disease -recent or ongoing radiation therapy -an unusual or allergic reaction to carboplatin, cisplatin, other chemotherapy, other medicines, foods, dyes, or preservatives -pregnant or trying to get pregnant -breast-feeding How should I use this medicine? This drug is usually given as an infusion into a vein. It is administered in a hospital or clinic by a specially trained health care professional. Talk to your pediatrician regarding the use of this medicine in children. Special care may be needed. Overdosage: If you think you have taken too much of this medicine contact a poison control center or emergency room at once. NOTE: This medicine is only for you. Do not share this medicine with others. What if I miss a dose? It is important not to miss a dose. Call your doctor or health care professional if you are unable to keep an appointment. What may interact with this medicine? -medicines for seizures -medicines to increase blood counts like filgrastim, pegfilgrastim, sargramostim -some antibiotics like amikacin, gentamicin, neomycin, streptomycin, tobramycin -vaccines Talk to your doctor or health care professional before taking any of these medicines: -acetaminophen -aspirin -ibuprofen -ketoprofen -naproxen This list may not describe all possible interactions. Give your health care provider a list of all the medicines, herbs, non-prescription drugs, or dietary supplements you use. Also tell them if you smoke, drink alcohol, or use illegal drugs. Some items may interact with your medicine. What should I watch for while using this medicine? Your condition will be monitored carefully while you are receiving this medicine. You will need important  blood work done while you are taking this medicine. This drug may make you feel generally unwell. This is not uncommon, as chemotherapy can affect healthy cells as well as cancer cells. Report any side effects. Continue your course of treatment even though you feel ill unless your doctor tells you to stop. In some cases, you may be given additional medicines to help with side effects. Follow all directions for their use. Call your doctor or health care professional for advice if you get a fever, chills or sore throat, or other symptoms of a cold or flu. Do not treat yourself. This drug decreases your body's ability to fight infections. Try to avoid being around people who are sick. This medicine may increase your risk to bruise or bleed. Call your doctor or health care professional if you notice any unusual bleeding. Be careful brushing and flossing your teeth  or using a toothpick because you may get an infection or bleed more easily. If you have any dental work done, tell your dentist you are receiving this medicine. Avoid taking products that contain aspirin, acetaminophen, ibuprofen, naproxen, or ketoprofen unless instructed by your doctor. These medicines may hide a fever. Do not become pregnant while taking this medicine. Women should inform their doctor if they wish to become pregnant or think they might be pregnant. There is a potential for serious side effects to an unborn child. Talk to your health care professional or pharmacist for more information. Do not breast-feed an infant while taking this medicine. What side effects may I notice from receiving this medicine? Side effects that you should report to your doctor or health care professional as soon as possible: -allergic reactions like skin rash, itching or hives, swelling of the face, lips, or tongue -signs of infection - fever or chills, cough, sore throat, pain or difficulty passing urine -signs of decreased platelets or bleeding -  bruising, pinpoint red spots on the skin, black, tarry stools, nosebleeds -signs of decreased red blood cells - unusually weak or tired, fainting spells, lightheadedness -breathing problems -changes in hearing -changes in vision -chest pain -high blood pressure -low blood counts - This drug may decrease the number of white blood cells, red blood cells and platelets. You may be at increased risk for infections and bleeding. -nausea and vomiting -pain, swelling, redness or irritation at the injection site -pain, tingling, numbness in the hands or feet -problems with balance, talking, walking -trouble passing urine or change in the amount of urine Side effects that usually do not require medical attention (report to your doctor or health care professional if they continue or are bothersome): -hair loss -loss of appetite -metallic taste in the mouth or changes in taste This list may not describe all possible side effects. Call your doctor for medical advice about side effects. You may report side effects to FDA at 1-800-FDA-1088. Where should I keep my medicine? This drug is given in a hospital or clinic and will not be stored at home. NOTE: This sheet is a summary. It may not cover all possible information. If you have questions about this medicine, talk to your doctor, pharmacist, or health care provider.    2016, [employer] Standard. (2007-05-31 14:38:05)  Pegfilgrastim injection What is this medicine? PEGFILGRASTIM (PEG fil gra stim) is a long-acting granulocyte colony-stimulating factor that stimulates the growth of neutrophils, a type of white blood cell important in the body's fight against infection. It is used to reduce the incidence of fever and infection in patients with certain types of cancer who are receiving chemotherapy that affects the bone marrow, and to increase survival after being exposed to high doses of radiation. This medicine may be used for other purposes; ask  your health care provider or pharmacist if you have questions. What should I tell my health care provider before I take this medicine? They need to know if you have any of these conditions: -kidney disease -latex allergy -ongoing radiation therapy -sickle cell disease -skin reactions to acrylic adhesives (On-Body Injector only) -an unusual or allergic reaction to pegfilgrastim, filgrastim, other medicines, foods, dyes, or preservatives -pregnant or trying to get pregnant -breast-feeding How should I use this medicine? This medicine is for injection under the skin. If you get this medicine at home, you will be taught how to prepare and give the pre-filled syringe or how to use the On-body Injector. Refer to the  patient Instructions for Use for detailed instructions. Use exactly as directed. Take your medicine at regular intervals. Do not take your medicine more often than directed. It is important that you put your used needles and syringes in a special sharps container. Do not put them in a trash can. If you do not have a sharps container, call your pharmacist or healthcare provider to get one. Talk to your pediatrician regarding the use of this medicine in children. While this drug may be prescribed for selected conditions, precautions do apply. Overdosage: If you think you have taken too much of this medicine contact a poison control center or emergency room at once. NOTE: This medicine is only for you. Do not share this medicine with others. What if I miss a dose? It is important not to miss your dose. Call your doctor or health care professional if you miss your dose. If you miss a dose due to an On-body Injector failure or leakage, a new dose should be administered as soon as possible using a single prefilled syringe for manual use. What may interact with this medicine? Interactions have not been studied. Give your health care provider a list of all the medicines, herbs, non-prescription  drugs, or dietary supplements you use. Also tell them if you smoke, drink alcohol, or use illegal drugs. Some items may interact with your medicine. This list may not describe all possible interactions. Give your health care provider a list of all the medicines, herbs, non-prescription drugs, or dietary supplements you use. Also tell them if you smoke, drink alcohol, or use illegal drugs. Some items may interact with your medicine. What should I watch for while using this medicine? You may need blood work done while you are taking this medicine. If you are going to need a MRI, CT scan, or other procedure, tell your doctor that you are using this medicine (On-Body Injector only). What side effects may I notice from receiving this medicine? Side effects that you should report to your doctor or health care professional as soon as possible: -allergic reactions like skin rash, itching or hives, swelling of the face, lips, or tongue -dizziness -fever -pain, redness, or irritation at site where injected -pinpoint red spots on the skin -red or dark-brown urine -shortness of breath or breathing problems -stomach or side pain, or pain at the shoulder -swelling -tiredness -trouble passing urine or change in the amount of urine Side effects that usually do not require medical attention (report to your doctor or health care professional if they continue or are bothersome): -bone pain -muscle pain This list may not describe all possible side effects. Call your doctor for medical advice about side effects. You may report side effects to FDA at 1-800-FDA-1088. Where should I keep my medicine? Keep out of the reach of children. Store pre-filled syringes in a refrigerator between 2 and 8 degrees C (36 and 46 degrees F). Do not freeze. Keep in carton to protect from light. Throw away this medicine if it is left out of the refrigerator for more than 48 hours. Throw away any unused medicine after the expiration  date. NOTE: This sheet is a summary. It may not cover all possible information. If you have questions about this medicine, talk to your doctor, pharmacist, or health care provider.    2016, Elsevier/Gold Standard. (2014-03-15 14:30:14)

## 2015-09-12 ENCOUNTER — Encounter: Payer: Self-pay | Admitting: Nurse Practitioner

## 2015-09-12 ENCOUNTER — Telehealth: Payer: Self-pay | Admitting: *Deleted

## 2015-09-12 DIAGNOSIS — R739 Hyperglycemia, unspecified: Secondary | ICD-10-CM | POA: Insufficient documentation

## 2015-09-12 DIAGNOSIS — T7840XA Allergy, unspecified, initial encounter: Secondary | ICD-10-CM | POA: Insufficient documentation

## 2015-09-12 NOTE — Assessment & Plan Note (Signed)
Patient states that she has been diagnosed as a pre--diabetic in the past.  She has controlled her diabetes with diet only in the past.  Patient states she is taking steroids as a part of her chemotherapy treatment; and has noticed that her blood sugars have greatly increased within the past few days.  Acknowledged to the patient that chemotherapy and associated steroid use could definitely be increasing her blood sugar.  Recommended that patient continue to check her blood sugar on a regular basis and keep a blood sugar log.  She should call her primary care physician in the morning; and review all details with him as well.  He may need to consider initiating a low-dose diabetic medication versus a sliding scale insulin on an as-needed basis.  Patient stated understanding of all instructions.  She agreed that she would call her primary care physician in the morning for further follow-up.

## 2015-09-12 NOTE — Progress Notes (Signed)
SYMPTOM MANAGEMENT CLINIC    Chief Complaint: Hypersensitivity reaction  HPI:  Kristine Parrish 70 y.o. female diagnosed with breast cancer.  Presents to the [facility] today to receive cycle one of her Taxotere/carboplatin/Herceptin/Perjeta chemotherapy regimen.   No history exists.    Review of Systems  Cardiovascular: Positive for palpitations.  All other systems reviewed and are negative.   Past Medical History  Diagnosis Date  . Type 2 diabetes mellitus ([place])   . Peripheral neuropathy ([place])   . Peptic ulcer disease   . Hiatal hernia   . Colon polyps   . Breast cancer of upper-outer quadrant of left female breast ([place]) 08/15/2015  . Hypertension   . History of bronchitis   . GERD (gastroesophageal reflux disease)     Past Surgical History  Procedure Laterality Date  . Vaginal hysterectomy  1995  . Cholecystectomy  1996  . Nerve removed from left foot  2000  . Appendectomy  1974  . Cataract surgery  04/2012  . Tubal ligation    . Foot surgery      left bunion removed  . Colonoscopy    . Portacath placement N/A 09/05/2015    Procedure: INSERTION PORT-A-CATH;  Surgeon: Autumn Messing III, MD;  Location: [facility];  Service: General;  Laterality: N/A;    has Breast cancer of upper-outer quadrant of left female breast ([place]); Hypersensitivity reaction; and Hyperglycemia on her problem list.    has No Known Allergies.    Medication List       This list is accurate as of: 09/11/15 11:59 PM.  Always use your most recent med list.               aspirin 81 MG tablet  Take 81 mg by mouth daily.     gabapentin 600 MG tablet  Commonly known as:  NEURONTIN  TAKE ONE TABLET BY MOUTH THREE TIMES DAILY     hydrochlorothiazide 12.5 MG capsule  Commonly known as:  MICROZIDE  Take 12.5 mg by mouth daily.     HYDROcodone-acetaminophen 5-325 MG tablet  Commonly known as:  NORCO  Take 1-2 tablets by mouth every 6 (six) hours as needed.     lidocaine-prilocaine cream    Commonly known as:  EMLA  Apply to affected area once     LORazepam 0.5 MG tablet  Commonly known as:  ATIVAN  Take 1 tablet (0.5 mg total) by mouth at bedtime as needed for anxiety.     lovastatin 20 MG tablet  Commonly known as:  MEVACOR  Take 20 mg by mouth at bedtime.     multivitamin tablet  Take 1 tablet by mouth daily.     omeprazole 20 MG capsule  Commonly known as:  PRILOSEC  Take 20 mg by mouth daily.     PROBIOTIC DAILY PO  Take by mouth. Reported on 08/21/2015     prochlorperazine 10 MG tablet  Commonly known as:  COMPAZINE  Take 1 tablet (10 mg total) by mouth every 6 (six) hours as needed (Nausea or vomiting).     venlafaxine XR 37.5 MG 24 hr capsule  Commonly known as:  EFFEXOR-XR  Take 1 capsule (37.5 mg total) by mouth daily with breakfast.         PHYSICAL EXAMINATION  Oncology Vitals 09/11/2015 09/11/2015  Temp 97.6 -  Pulse 67 70  Resp 16 16  SpO2 97 94   BP Readings from Last 2 Encounters:  09/11/15 144/76  09/06/15  138/74    Physical Exam  Constitutional: She is oriented to person, place, and time and well-developed, well-nourished, and in no distress.  HENT:  Head: Normocephalic and atraumatic.  Eyes: Conjunctivae and EOM are normal. Pupils are equal, round, and reactive to light. Right eye exhibits no discharge. Left eye exhibits no discharge. No scleral icterus.  Neck: Normal range of motion. Neck supple. No JVD present. No tracheal deviation present. No thyromegaly present.  Cardiovascular: Normal rate, regular rhythm, normal heart sounds and intact distal pulses.   Pulmonary/Chest: Effort normal and breath sounds normal. No stridor. No respiratory distress. She has no wheezes. She has no rales. She exhibits no tenderness.  Abdominal: Soft. Bowel sounds are normal. She exhibits no distension and no mass. There is no tenderness. There is no rebound and no guarding.  Musculoskeletal: Normal range of motion. She exhibits no edema or  tenderness.  Lymphadenopathy:    She has no cervical adenopathy.  Neurological: She is alert and oriented to person, place, and time.  Skin: Skin is warm and dry. No rash noted. No erythema. No pallor.  Psychiatric: Affect normal.  Nursing note and vitals reviewed.   LABORATORY DATA:. Appointment on 09/11/2015  Component Date Value Ref Range Status  . WBC 09/11/2015 9.7  3.9 - 10.3 10e3/uL Final  . NEUT# 09/11/2015 8.2* 1.5 - 6.5 10e3/uL Final  . HGB 09/11/2015 12.0  11.6 - 15.9 g/dL Final  . HCT 09/11/2015 36.9  34.8 - 46.6 % Final  . Platelets 09/11/2015 268  145 - 400 10e3/uL Final  . MCV 09/11/2015 85.1  79.5 - 101.0 fL Final  . MCH 09/11/2015 27.7  25.1 - 34.0 pg Final  . MCHC 09/11/2015 32.6  31.5 - 36.0 g/dL Final  . RBC 09/11/2015 4.34  3.70 - 5.45 10e6/uL Final  . RDW 09/11/2015 14.0  11.2 - 14.5 % Final  . lymph# 09/11/2015 1.1  0.9 - 3.3 10e3/uL Final  . MONO# 09/11/2015 0.4  0.1 - 0.9 10e3/uL Final  . Eosinophils Absolute 09/11/2015 0.0  0.0 - 0.5 10e3/uL Final  . Basophils Absolute 09/11/2015 0.0  0.0 - 0.1 10e3/uL Final  . NEUT% 09/11/2015 84.6* 38.4 - 76.8 % Final  . LYMPH% 09/11/2015 11.0* 14.0 - 49.7 % Final  . MONO% 09/11/2015 4.3  0.0 - 14.0 % Final  . EOS% 09/11/2015 0.0  0.0 - 7.0 % Final  . BASO% 09/11/2015 0.1  0.0 - 2.0 % Final  . Sodium 09/11/2015 138  136 - 145 mEq/L Final  . Potassium 09/11/2015 3.8  3.5 - 5.1 mEq/L Final  . Chloride 09/11/2015 102  98 - 109 mEq/L Final  . CO2 09/11/2015 24  22 - 29 mEq/L Final  . Glucose 09/11/2015 341* 70 - 140 mg/dl Final   Glucose reference range is for nonfasting patients. Fasting glucose reference range is 70- 100.  [place] Kitchen BUN 09/11/2015 16.4  7.0 - 26.0 mg/dL Final  . Creatinine 09/11/2015 0.9  0.6 - 1.1 mg/dL Final  . Total Bilirubin 09/11/2015 0.36  0.20 - 1.20 mg/dL Final  . Alkaline Phosphatase 09/11/2015 137  40 - 150 U/L Final  . AST 09/11/2015 24  5 - 34 U/L Final  . ALT 09/11/2015 37  0 - 55 U/L Final  .  Total Protein 09/11/2015 7.4  6.4 - 8.3 g/dL Final  . Albumin 09/11/2015 3.5  3.5 - 5.0 g/dL Final  . Calcium 09/11/2015 9.6  8.4 - 10.4 mg/dL Final  . Anion Gap 09/11/2015 12*  3 - 11 mEq/L Final  . EGFR 09/11/2015 63* >90 ml/min/1.73 m2 Final   eGFR is calculated using the CKD-EPI Creatinine Equation (2009)  Hospital Outpatient Visit on 09/09/2015  Component Date Value Ref Range Status  . LV PW d 09/09/2015 10.7* 0.6 - 1.1 mm Final  . FS 09/09/2015 25* 28 - 44 % Final  . LA ID, A-P, ES 09/09/2015 31   Final  . IVS/LV PW RATIO, ED 09/09/2015 1.06   Final  . LVOT VTI 09/09/2015 21.8   Final  . LV e' LATERAL 09/09/2015 8.7   Final  . LV E/e' medial 09/09/2015 9.48   Final  . LV E/e'average 09/09/2015 9.48   Final  . LA diam index 09/09/2015 1.67   Final  . LA vol A4C 09/09/2015 36.7   Final  . E decel time 09/09/2015 190   Final  . LVOT diameter 09/09/2015 20   Final  . LVOT area 09/09/2015 3.14   Final  . LVOT peak vel 09/09/2015 97.5   Final  . LVOT SV 09/09/2015 68   Final  . Peak grad 09/09/2015 3   Final  . E/e' ratio 09/09/2015 9.48   Final  . MV pk E vel 09/09/2015 82.5   Final  . MV pk A vel 09/09/2015 113   Final  . MV Dec 09/09/2015 190   Final  . LA diam end sys 09/09/2015 31   Final  . TDI e' medial 09/09/2015 6.85   Final  . TDI e' lateral 09/09/2015 8.7   Final  . TAPSE 09/09/2015 21.2   Final    RADIOGRAPHIC STUDIES: No results found.  ASSESSMENT/PLAN:    Hypersensitivity reaction Patient had completed all of her first cycle of chemotherapy today; when she experienced a hypersensitivity reaction which consisted of a vague chest discomfort only.  She denied any actual chest pain, shortness of breath, or pain with inspiration.  She also denied any nausea, vomiting, or other symptoms.  Vital signs remained stable throughout.  Patient was given Benadryl 25 mg, Pepcid 20 mg, and Solu-Medrol 60 mg IV; which resolved all of patient's symptoms.  Patient was allowed to  be discharged home with her family.  Patient was given both verbal and printed instructions regarding the use of Benadryl and Pepcid for mild hypersensitivity reactions in the future.  Also, patient was advised to go directly to the emergency department for any worsening symptoms whatsoever.  Hyperglycemia Patient states that she has been diagnosed as a pre--diabetic in the past.  She has controlled her diabetes with diet only in the past.  Patient states she is taking steroids as a part of her chemotherapy treatment; and has noticed that her blood sugars have greatly increased within the past few days.  Acknowledged to the patient that chemotherapy and associated steroid use could definitely be increasing her blood sugar.  Recommended that patient continue to check her blood sugar on a regular basis and keep a blood sugar log.  She should call her primary care physician in the morning; and review all details with him as well.  He may need to consider initiating a low-dose diabetic medication versus a sliding scale insulin on an as-needed basis.  Patient stated understanding of all instructions.  She agreed that she would call her primary care physician in the morning for further follow-up.  Breast cancer of upper-outer quadrant of left female breast [facility]) Patient presented to the [facility] today to receive cycle one of her Taxotere/carboplatin/Herceptin/Perjeta  chemotherapy regimen.  She did develop a hypersensitivity reaction following the completion of all of her chemotherapy today.  See further notes for details.  Patient is scheduled to return on 09/19/2015 for labs and a follow-up visit.   Patient stated understanding of all instructions; and was in agreement with this plan of care. The patient knows to call the clinic with any problems, questions or concerns.   Total time spent with patient was 25 minutes;  with greater than 75 percent of that time spent in face to face counseling  regarding patient's symptoms,  and coordination of care and follow up.  Disclaimer:This dictation was prepared with Dragon/digital dictation along with [employer]. Any transcriptional errors that result from this process are unintentional.  Drue Second, NP 09/12/2015

## 2015-09-12 NOTE — Assessment & Plan Note (Signed)
Patient had completed all of her first cycle of chemotherapy today; when she experienced a hypersensitivity reaction which consisted of a vague chest discomfort only.  She denied any actual chest pain, shortness of breath, or pain with inspiration.  She also denied any nausea, vomiting, or other symptoms.  Vital signs remained stable throughout.  Patient was given Benadryl 25 mg, Pepcid 20 mg, and Solu-Medrol 60 mg IV; which resolved all of patient's symptoms.  Patient was allowed to be discharged home with her family.  Patient was given both verbal and printed instructions regarding the use of Benadryl and Pepcid for mild hypersensitivity reactions in the future.  Also, patient was advised to go directly to the emergency department for any worsening symptoms whatsoever.

## 2015-09-12 NOTE — Assessment & Plan Note (Signed)
Patient presented to the [facility] today to receive cycle one of her Taxotere/carboplatin/Herceptin/Perjeta chemotherapy regimen.  She did develop a hypersensitivity reaction following the completion of all of her chemotherapy today.  See further notes for details.  Patient is scheduled to return on 09/19/2015 for labs and a follow-up visit.

## 2015-09-12 NOTE — Telephone Encounter (Signed)
Called pt assess after 1st chemo on 09/11/15. Relate doing well and denies any complaints. Pt relate no further chest pain or discomfort. Encourage pt to call with needs or concerns. Received verbal understanding.

## 2015-09-13 ENCOUNTER — Ambulatory Visit: Payer: BLUE CROSS/BLUE SHIELD

## 2015-09-13 ENCOUNTER — Other Ambulatory Visit: Payer: BLUE CROSS/BLUE SHIELD

## 2015-09-19 ENCOUNTER — Other Ambulatory Visit: Payer: Self-pay | Admitting: *Deleted

## 2015-09-19 ENCOUNTER — Ambulatory Visit (HOSPITAL_COMMUNITY)
Admission: RE | Admit: 2015-09-19 | Discharge: 2015-09-19 | Disposition: A | Discharge status: Home / Self Care | Payer: BLUE CROSS/BLUE SHIELD | Source: Ambulatory Visit | Attending: Oncology | Admitting: Oncology

## 2015-09-19 ENCOUNTER — Encounter: Payer: Self-pay | Admitting: *Deleted

## 2015-09-19 ENCOUNTER — Ambulatory Visit (HOSPITAL_BASED_OUTPATIENT_CLINIC_OR_DEPARTMENT_OTHER): Payer: BLUE CROSS/BLUE SHIELD | Admitting: Oncology

## 2015-09-19 ENCOUNTER — Other Ambulatory Visit (HOSPITAL_BASED_OUTPATIENT_CLINIC_OR_DEPARTMENT_OTHER): Payer: BLUE CROSS/BLUE SHIELD

## 2015-09-19 VITALS — BP 134/72 | HR 74 | Temp 97.7°F | Resp 18 | Ht 65.0 in | Wt 163.2 lb

## 2015-09-19 DIAGNOSIS — E860 Dehydration: Secondary | ICD-10-CM

## 2015-09-19 DIAGNOSIS — C50412 Malignant neoplasm of upper-outer quadrant of left female breast: Secondary | ICD-10-CM

## 2015-09-19 DIAGNOSIS — E876 Hypokalemia: Secondary | ICD-10-CM | POA: Diagnosis not present

## 2015-09-19 LAB — COMPREHENSIVE METABOLIC PANEL
ALT: 48 U/L (ref 0–55)
AST: 39 U/L — ABNORMAL HIGH (ref 5–34)
Albumin: 3.5 g/dL (ref 3.5–5.0)
Alkaline Phosphatase: 146 U/L (ref 40–150)
Anion Gap: 12 mEq/L — ABNORMAL HIGH (ref 3–11)
BUN: 9.3 mg/dL (ref 7.0–26.0)
CO2: 28 mEq/L (ref 22–29)
Calcium: 9 mg/dL (ref 8.4–10.4)
Chloride: 100 mEq/L (ref 98–109)
Creatinine: 0.8 mg/dL (ref 0.6–1.1)
EGFR: 70 mL/min/{1.73_m2} — ABNORMAL LOW (ref >90)
Glucose: 178 mg/dl — ABNORMAL HIGH (ref 70–140)
Potassium: 2.6 mEq/L — CL (ref 3.5–5.1)
Sodium: 140 mEq/L (ref 136–145)
Total Bilirubin: <0.3 mg/dL (ref 0.20–1.20)
Total Protein: 6.7 g/dL (ref 6.4–8.3)

## 2015-09-19 LAB — CBC WITH DIFFERENTIAL/PLATELET
BASO%: 0.4 % (ref 0.0–2.0)
Basophils Absolute: 0.1 10*3/uL (ref 0.0–0.1)
EOS%: 0.2 % (ref 0.0–7.0)
Eosinophils Absolute: 0 10*3/uL (ref 0.0–0.5)
HCT: 37.5 % (ref 34.8–46.6)
HGB: 12.1 g/dL (ref 11.6–15.9)
LYMPH%: 14.4 % (ref 14.0–49.7)
MCH: 27 pg (ref 25.1–34.0)
MCHC: 32.1 g/dL (ref 31.5–36.0)
MCV: 83.9 fL (ref 79.5–101.0)
MONO#: 0.5 10*3/uL (ref 0.1–0.9)
MONO%: 2.6 % (ref 0.0–14.0)
NEUT#: 16.5 10*3/uL — ABNORMAL HIGH (ref 1.5–6.5)
NEUT%: 82.4 % — ABNORMAL HIGH (ref 38.4–76.8)
Platelets: 203 10*3/uL (ref 145–400)
RBC: 4.47 10*6/uL (ref 3.70–5.45)
RDW: 13.8 % (ref 11.2–14.5)
WBC: 20.1 10*3/uL — ABNORMAL HIGH (ref 3.9–10.3)
lymph#: 2.9 10*3/uL (ref 0.9–3.3)

## 2015-09-19 MED ORDER — HEPARIN SOD (PORK) LOCK FLUSH 100 UNIT/ML IV SOLN
500.0000 [IU] | INTRAVENOUS | Status: AC | PRN
  Administered 2015-09-19: 500 [IU]
  Filled 2015-09-19: qty 5

## 2015-09-19 MED ORDER — POTASSIUM CHLORIDE ER 10 MEQ PO CPCR: 20.0000 meq | ORAL_CAPSULE | Freq: Every day | ORAL | Status: DC

## 2015-09-19 MED ORDER — SODIUM CHLORIDE 0.9% FLUSH
10.0000 mL | INTRAVENOUS | Status: AC | PRN
  Administered 2015-09-19: 10 mL

## 2015-09-19 MED ORDER — SODIUM CHLORIDE 0.9 % IV SOLN: INTRAVENOUS | Status: DC

## 2015-09-19 MED ORDER — POTASSIUM CHLORIDE IN NACL 20-0.9 MEQ/L-% IV SOLN
INTRAVENOUS | Status: AC
  Administered 2015-09-19: 14:00:00 via INTRAVENOUS
  Filled 2015-09-19: qty 1000

## 2015-09-19 NOTE — Discharge Instructions (Signed)
Pt received 1 liter of normal saline with 20 meq of potassium via porta cath.

## 2015-09-19 NOTE — Progress Notes (Signed)
Diagnosis Association: Dehydration (276.51)  Provider: Dr. Jana Hakim  Procedure: Pt received 1 liter of normal saline with 47meq potassium over 2 hours  Pt tolerated well.  Post procedure: Pt alert, oriented and ambulatory at discharge. Porta cath deaccessed and flushed per protocol.

## 2015-09-19 NOTE — Progress Notes (Signed)
Kristine Parrish  [phone] [phone]     ID: Kristine Parrish DOB: [birth year]  MR#: [id number]  IOX#:[id number]  Patient Care Team: Orpah Melter, MD as PCP - General (Family Medicine) Autumn Messing III, MD as Consulting Physician (General Surgery) Chauncey Cruel, MD as Consulting Physician (Oncology) Eppie Gibson, MD as Attending Physician (Radiation Oncology) Murvin Donning, MD (Dermatology) Laurence Spates, MD as Consulting Physician (Gastroenterology) Rosemary Holms, DPM as Consulting Physician (Podiatry) Viona Gilmore Evette Cristal, MD as Consulting Physician (Obstetrics and Gynecology) Larey Dresser, MD as Consulting Physician (Cardiology) Benson Norway, RN as Registered Nurse PCP: Orpah Melter, MD OTHER MD:  CHIEF COMPLAINT: Estrogen receptor positive breast cancer  CURRENT TREATMENT: Neoadjuvant chemotherapy   BREAST CANCER HISTORY: From the original intake note  Kristine Parrish herself noted a change in her left breast and brought it to the attention of Dr. Nori Riis, who set her up for bilateral diagnostic mammography with tomography and left breast ultrasonography at the [facility] 08/09/2015. The breast density was category D. In the area of palpable concern there was a mass with irregular margins and architectural distortion, measuring approximately 3 cm. This was palpable in the upper outer quadrant near the nipple. The ultrasound confirmed an irregular hypoechoic mass approximately 3 cm from the nipple at the 10:00 position measuring 2.7 cm. Ultrasound of the left axilla found a single level I left axillary node with focal cortical thickening.  On 08/13/2015 the patient underwent biopsy of the left breast mass and the suspicious left axillary lymph node, both showing invasive ductal carcinoma, grade 2 or 3, estrogen receptor 100% positive, progesterone receptor 100% positive, both with strong staining intensity, with an MIB-1 of 50%, and HER-2 amplification,  the signals ratio being 2.2 to and the number per cell 3.55  The patient's subsequent history is as detailed below  INTERVAL HISTORY: Kristine Parrish returns today for follow-up of her HER-2 positive breast cancer. Today is day 9 cycle 1 of 6 planned cycles of carboplatin, docetaxel, trastuzumab, and pertuzumab. She receives OnPro with each cycle.  REVIEW OF SYSTEMS: Kristine Parrish's experience was terrible. Clinically she did okay the first 2 days of treatment, but her blood sugars shot up to 400. She then developed nausea which still persists. She vomited 1. The next thing that happen was severe diarrhea, which was watery and voluminous. She managed to control it with Imodium, but she has lost about 10 pounds in weight, is dehydrated, and feels very weak at present. She also had crampy abdominal pain. The only thing she's been able to eat as Jell-O and clear liquids. All this happened even though she meticulously followed all the instructions regarding anti-emetics and other supportive medications. A detailed review of systems was otherwise noncontributory   PAST MEDICAL HISTORY: Past Medical History  Diagnosis Date  . Type 2 diabetes mellitus ([place])   . Peripheral neuropathy ([place])   . Peptic ulcer disease   . Hiatal hernia   . Colon polyps   . Breast cancer of upper-outer quadrant of left female breast ([place]) 08/15/2015  . Hypertension   . History of bronchitis   . GERD (gastroesophageal reflux disease)     PAST SURGICAL HISTORY: Past Surgical History  Procedure Laterality Date  . Vaginal hysterectomy  1995  . Cholecystectomy  1996  . Nerve removed from left foot  2000  . Appendectomy  1974  . Cataract surgery  04/2012  . Tubal ligation    . Foot surgery  left bunion removed  . Colonoscopy    . Portacath placement N/A 09/05/2015    Procedure: INSERTION PORT-A-CATH;  Surgeon: Autumn Messing III, MD;  Location[facility] OR;  Service: General;  Laterality: N/A;    FAMILY HISTORY Family History  Problem  Relation Age of Onset  . Diabetes Mother   . Hypertension Mother   . Alcoholism Maternal Grandfather   . Congestive Heart Failure Maternal Grandmother   The patient has little information about her father and is not sure of his cause of death or age at death. The patient's mother died at age 33. The patient had no siblings. She was raised by her grandmother. She is not aware of any breast or ovarian cancer history in the family   GYNECOLOGIC HISTORY:  No LMP recorded. Patient has had a hysterectomy.  menarche age 61, first live birth age 35. The patient is GX P2. She underwent hysterectomy with bilateral salpingo-oophorectomy in 1994, and has been on estrogen replacement since that time, discontinued June 2017.   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 Daughter Kristine Parrish lives in [place] and works as a Teacher, music for the  [employer]. Son Kristine Parrish lives in [place] where he is Secondary school teacher. The patient had one grandchild. She attends [employer]. The patient's daughter Kristine Parrish is her healthcare power of attorney.     HEALTH MAINTENANCE: Social History  Substance Use Topics  . Smoking status: Never Smoker   . Smokeless tobacco: Never Used  . Alcohol Use: No     Colonoscopy: 2012/[facility]  PAP:  Bone density: 2015/"normal" at Dr. Verlon Au  Lipid panel:  No Known Allergies  Current Outpatient Prescriptions  Medication Sig Dispense Refill  . aspirin 81 MG tablet Take 81 mg by mouth daily.    Marland Kitchen gabapentin (NEURONTIN) 600 MG tablet TAKE ONE TABLET BY MOUTH THREE TIMES DAILY  90 tablet 5  . hydrochlorothiazide (MICROZIDE) 12.5 MG capsule Take 12.5 mg by mouth daily.    Marland Kitchen HYDROcodone-acetaminophen (NORCO) 5-325 MG tablet Take 1-2 tablets by mouth every 6 (six) hours as needed. 30 tablet 0  . lidocaine-prilocaine  (EMLA) cream Apply to affected area once 30 g 3  . LORazepam (ATIVAN) 0.5 MG tablet Take 1 tablet (0.5 mg total) by mouth at bedtime as needed for anxiety. 30 tablet 0  . lovastatin (MEVACOR) 20 MG tablet Take 20 mg by mouth at bedtime.    . Multiple Vitamin (MULTIVITAMIN) tablet Take 1 tablet by mouth daily.    Marland Kitchen omeprazole (PRILOSEC) 20 MG capsule Take 20 mg by mouth daily.    . Probiotic Product (PROBIOTIC DAILY PO) Take by mouth. Reported on 08/21/2015    . prochlorperazine (COMPAZINE) 10 MG tablet Take 1 tablet (10 mg total) by mouth every 6 (six) hours as needed (Nausea or vomiting). 30 tablet 1  . venlafaxine XR (EFFEXOR-XR) 37.5 MG 24 hr capsule Take 1 capsule (37.5 mg total) by mouth daily with breakfast. 30 capsule 4   No current facility-administered medications for this visit.    OBJECTIVE: Middle-aged white woman Who appears stated age   70 Vitals:   09/19/15 1229  BP: 134/72  Pulse: 74  Temp: 97.7 F (36.5 C)  Resp: 18     Body mass index is 27.16 kg/(m^2).    ECOG FS:2 - Symptomatic, <50% confined to bed  Sclerae unicteric, EOMs intact Oropharynx clear and dry No cervical or supraclavicular adenopathy Lungs no rales or rhonchi Heart regular rate and rhythm Abd soft, nontender, positive bowel sounds MSK no focal spinal tenderness, no upper extremity lymphedema Neuro: nonfocal, well oriented, appropriate affect Breasts: Deferred  LAB RESULTS:  CMP     Component Value Date/Time   NA 140 09/19/2015 1218   NA 139 09/03/2015 1542   K 2.6* 09/19/2015 1218   K 3.3* 09/03/2015 1542   CL 102 09/03/2015 1542   CO2 28 09/19/2015 1218   CO2 28 09/03/2015 1542   GLUCOSE 178* 09/19/2015 1218   GLUCOSE 161* 09/03/2015 1542   BUN 9.3 09/19/2015 1218   BUN 7 09/03/2015 1542   CREATININE 0.8 09/19/2015 1218   CREATININE 0.76 09/03/2015 1542   CALCIUM 9.0 09/19/2015 1218   CALCIUM 9.1 09/03/2015 1542   PROT 6.7 09/19/2015 1218   PROT 7.0 03/16/2013 0831   ALBUMIN  3.5 09/19/2015 1218   AST 39* 09/19/2015 1218   ALT 48 09/19/2015 1218   ALKPHOS 146 09/19/2015 1218   BILITOT <0.30 09/19/2015 1218   GFRNONAA >60 09/03/2015 1542   GFRAA >60 09/03/2015 1542    INo results found for: SPEP, UPEP  Lab Results  Component Value Date   WBC 20.1* 09/19/2015   NEUTROABS 16.5* 09/19/2015   HGB 12.1 09/19/2015   HCT 37.5 09/19/2015   MCV 83.9 09/19/2015   PLT 203 09/19/2015      Chemistry      Component Value Date/Time   NA 140 09/19/2015 1218   NA 139 09/03/2015 1542   K 2.6* 09/19/2015 1218   K 3.3* 09/03/2015 1542   CL 102 09/03/2015 1542   CO2 28 09/19/2015 1218   CO2 28 09/03/2015 1542   BUN 9.3 09/19/2015 1218   BUN 7 09/03/2015 1542   CREATININE 0.8 09/19/2015 1218   CREATININE 0.76 09/03/2015 1542      Component Value Date/Time   CALCIUM 9.0 09/19/2015 1218   CALCIUM 9.1 09/03/2015 1542   ALKPHOS 146 09/19/2015 1218   AST 39* 09/19/2015 1218   ALT 48 09/19/2015 1218   BILITOT <0.30 09/19/2015 1218       No results found for: LABCA2  No components found for: LABCA125  No results for input(s): INR in the last 168 hours.  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 LABS:  None obtained EXAM: BILATERAL BREAST MRI WITH AND WITHOUT CONTRAST TECHNIQUE: Multiplanar, multisequence MR images of both breasts were obtained prior to and following the intravenous administration of 14 ml of MultiHance. THREE-DIMENSIONAL MR IMAGE RENDERING ON INDEPENDENT WORKSTATION: Three-dimensional MR images were rendered by Parrish-processing of the original MR data on an independent  workstation. The three-dimensional MR images were interpreted, and findings are reported in the following complete MRI report for this study. Three dimensional images were evaluated at the independent DynaCad workstation COMPARISON:  Previous exam(s). FINDINGS: Breast composition: c. Heterogeneous fibroglandular tissue. Background parenchymal enhancement: Minimal Right breast: No mass or abnormal enhancement. Left breast: In the anterior third of the upper outer quadrant left breast is a heterogeneously enhancing irregular mass measuring 2.3 x 2.0 x 1.5 cm (AP by transverse by craniocaudal). The mass  demonstrates a combination of washout and plateau kinetics. Biopsy clip artifact is present within the mass. There are no additional masses or suspicious areas of enhancement within the left breast. Lymph nodes: There is a 1.0 cm short axis level 1 left axillary lymph node with diffuse cortical thickening. This likely corresponds to the recently biopsied node with cortical thickening on ultrasound. There are no additional suspicious left axillary lymph nodes. Negative for right axillary and internal mammary chain lymphadenopathy. Ancillary findings:  None. IMPRESSION: 1. Solitary biopsy-proven malignancy in the upper outer quadrant, anterior third, of the left breast. This mass measures 2.3 x 2.0 x 1.5 cm on MRI. 2. Diffuse cortical thickening of a 1.0 cm level 1 left axillary lymph node. This likely corresponds to the recently biopsied lymph node using ultrasound guidance, showing metastatic ductal carcinoma at pathology. 3. No evidence of malignancy in the right breast. RECOMMENDATION: Treatment planning for known left breast cancer with left axillary nodal metastasis. BI-RADS CATEGORY  6: Known biopsy-proven malignancy. Electronically Signed   By: Curlene Dolphin M.D.   On: 08/26/2015 12:01   Dg Chest Port 1 View  09/05/2015  CLINICAL DATA:  Right Port-A-Cath insertion EXAM: PORTABLE CHEST 1 VIEW COMPARISON:  None.  FINDINGS: Cardiomediastinal silhouette is unremarkable. No acute infiltrate or pulmonary edema. There is right subclavian Port-A-Cath with tip in SVC. No pneumothorax. IMPRESSION: Right subclavian Port-A-Cath with tip in SVC.  No pneumothorax. Electronically Signed   By: Lahoma Crocker M.D.   On: 09/05/2015 09:17   Dg Fluoro Guide Cv Line-no Report  09/05/2015  CLINICAL DATA:  FLOURO GUIDE CV LINE Fluoroscopy was utilized by the requesting physician.  No radiographic interpretation.    ELIGIBLE FOR AVAILABLE RESEARCH PROTOCOL: PALLAS, ALLIANCE  ASSESSMENT: 70 y.o. [place] woman status Parrish left breast upper outer quadrant and left axillary lymph node biopsy 08/13/2015 both positive for an invasive ductal carcinoma, grade 2 or 3, triple positive, with an MIB-1 of 50%  (1) neoadjuvant chemotherapy consisting of carboplatin, docetaxel, trastuzumab and pertuzumab given every 21 days 6, beginning 09/11/2015  (a) treatment changed to Abraxane and trastuzumab with cycle 2 because of significant problems after cycle 1  (2) trastuzumab will be continued to complete a year  (a) most recent echocardiogram 09/09/2015 showed an ejection fraction of 60-65%   (3) definitive surgery to follow with TAD and consideration of the Alliance trial  (4) adjuvant radiation to follow as appropriate  (5) anti-estrogens to follow, with consideration of the PALLAS trial  PLAN: We are not going to be able to continue the current treatment plan. Even though she only received steroids as premeds on the day of chemotherapy, her blood sugar rose to over 400. Even though she followed the anti-emetics meticulously she still had vomiting and still has nausea. Even though she used Imodium appropriately, she has become dehydrated and hypokalemic.  Accordingly we are stopping the carboplatin and docetaxel on switching to Abraxane. This does not require steroid premeds. Also it should be less of a problem from the point of view of  nausea. It will not require OnPro support.  I am also going to hold off on pertuzumab for cycle 2. If she does well with the Abraxane and trastuzumab and I will try to add the pertuzumab to cycle 3.  Today we set her up for intravenous fluids with potassium supplementation and she will take oral potassium at home for the next 5 days. I asked her to call us and let us know how she  is doing tomorrow. If necessary we can continue to give her intravenous fluids daily until her diarrhea completely resolves.  She is going to see me again on July 21. At that time we will discuss the changes in treatment and make sure she has recovered from cycle 1 before proceeding to the second cycle which will be on July 27.  Chauncey Cruel, MD   09/19/2015 5:49 PM Medical Oncology and Hematology [facility] [address] [place], [place] [address] Tel. [phone]    Fax. [phone]

## 2015-09-19 NOTE — Telephone Encounter (Signed)
Per MD this RN called pt per low potassium level - and discussed with her MD request for pt to take 20 mEq of potassium for the next 5 days - additional amount will be dispensed to be used if needed.  Micro K sent to pt's pharmacy.

## 2015-09-20 ENCOUNTER — Telehealth: Payer: Self-pay

## 2015-09-20 ENCOUNTER — Telehealth: Payer: Self-pay | Admitting: Oncology

## 2015-09-20 NOTE — Telephone Encounter (Signed)
lvm to inform pt of 7/21 appt at 330 pm per orders

## 2015-09-20 NOTE — Telephone Encounter (Signed)
Per Dr. Jana Hakim, I advised patient to not take venlaxafine.  Med removed from pt med list.  Pt voiced understanding.

## 2015-09-26 ENCOUNTER — Other Ambulatory Visit (HOSPITAL_COMMUNITY): Payer: BLUE CROSS/BLUE SHIELD

## 2015-09-26 ENCOUNTER — Ambulatory Visit (HOSPITAL_COMMUNITY): Payer: BLUE CROSS/BLUE SHIELD

## 2015-09-26 DIAGNOSIS — M2011 Hallux valgus (acquired), right foot: Secondary | ICD-10-CM | POA: Diagnosis not present

## 2015-09-26 DIAGNOSIS — E1143 Type 2 diabetes mellitus with diabetic autonomic (poly)neuropathy: Secondary | ICD-10-CM | POA: Diagnosis not present

## 2015-09-27 ENCOUNTER — Other Ambulatory Visit (HOSPITAL_BASED_OUTPATIENT_CLINIC_OR_DEPARTMENT_OTHER): Payer: BLUE CROSS/BLUE SHIELD

## 2015-09-27 ENCOUNTER — Telehealth: Payer: Self-pay | Admitting: Oncology

## 2015-09-27 ENCOUNTER — Ambulatory Visit (HOSPITAL_BASED_OUTPATIENT_CLINIC_OR_DEPARTMENT_OTHER): Payer: BLUE CROSS/BLUE SHIELD | Admitting: Oncology

## 2015-09-27 VITALS — BP 135/73 | HR 98 | Temp 98.2°F | Resp 18 | Wt 167.3 lb

## 2015-09-27 DIAGNOSIS — C50412 Malignant neoplasm of upper-outer quadrant of left female breast: Secondary | ICD-10-CM

## 2015-09-27 DIAGNOSIS — G629 Polyneuropathy, unspecified: Secondary | ICD-10-CM

## 2015-09-27 DIAGNOSIS — C773 Secondary and unspecified malignant neoplasm of axilla and upper limb lymph nodes: Secondary | ICD-10-CM

## 2015-09-27 DIAGNOSIS — Z170 Estrogen receptor positive status [ER+]: Secondary | ICD-10-CM | POA: Diagnosis not present

## 2015-09-27 LAB — CBC WITH DIFFERENTIAL/PLATELET
BASO%: 0.4 % (ref 0.0–2.0)
Basophils Absolute: 0 10*3/uL (ref 0.0–0.1)
EOS%: 0 % (ref 0.0–7.0)
Eosinophils Absolute: 0 10*3/uL (ref 0.0–0.5)
HCT: 33.6 % — ABNORMAL LOW (ref 34.8–46.6)
HGB: 10.9 g/dL — ABNORMAL LOW (ref 11.6–15.9)
LYMPH%: 20.8 % (ref 14.0–49.7)
MCH: 27.8 pg (ref 25.1–34.0)
MCHC: 32.4 g/dL (ref 31.5–36.0)
MCV: 85.7 fL (ref 79.5–101.0)
MONO#: 0.9 10*3/uL (ref 0.1–0.9)
MONO%: 7.9 % (ref 0.0–14.0)
NEUT#: 7.6 10*3/uL — ABNORMAL HIGH (ref 1.5–6.5)
NEUT%: 70.9 % (ref 38.4–76.8)
Platelets: 176 10*3/uL (ref 145–400)
RBC: 3.92 10*6/uL (ref 3.70–5.45)
RDW: 14.1 % (ref 11.2–14.5)
WBC: 10.8 10*3/uL — ABNORMAL HIGH (ref 3.9–10.3)
lymph#: 2.2 10*3/uL (ref 0.9–3.3)

## 2015-09-27 LAB — COMPREHENSIVE METABOLIC PANEL
ALT: 32 U/L (ref 0–55)
AST: 22 U/L (ref 5–34)
Albumin: 3.2 g/dL — ABNORMAL LOW (ref 3.5–5.0)
Alkaline Phosphatase: 136 U/L (ref 40–150)
Anion Gap: 11 mEq/L (ref 3–11)
BUN: 7.2 mg/dL (ref 7.0–26.0)
CO2: 28 mEq/L (ref 22–29)
Calcium: 8.5 mg/dL (ref 8.4–10.4)
Chloride: 103 mEq/L (ref 98–109)
Creatinine: 0.8 mg/dL (ref 0.6–1.1)
EGFR: 73 mL/min/{1.73_m2} — ABNORMAL LOW (ref >90)
Glucose: 174 mg/dl — ABNORMAL HIGH (ref 70–140)
Potassium: 3.2 mEq/L — ABNORMAL LOW (ref 3.5–5.1)
Sodium: 142 mEq/L (ref 136–145)
Total Bilirubin: <0.3 mg/dL (ref 0.20–1.20)
Total Protein: 6.3 g/dL — ABNORMAL LOW (ref 6.4–8.3)

## 2015-09-27 NOTE — Progress Notes (Signed)
Kristine Parrish  Telephone:([phone]) [phone] Fax:(336) [phone]     ID: Kristine Parrish DOB: 1945-05-11  MR#: [id number]  IRS#:[id number]  Patient Care Team: Orpah Melter, MD as PCP - General (Family Medicine) Autumn Messing III, MD as Consulting Physician (General Surgery) Chauncey Cruel, MD as Consulting Physician (Oncology) Eppie Gibson, MD as Attending Physician (Radiation Oncology) Murvin Donning, MD (Dermatology) Laurence Spates, MD as Consulting Physician (Gastroenterology) Rosemary Holms, DPM as Consulting Physician (Podiatry) Viona Gilmore Evette Cristal, MD as Consulting Physician (Obstetrics and Gynecology) Larey Dresser, MD as Consulting Physician (Cardiology) Benson Norway, RN as Registered Nurse PCP: Orpah Melter, MD OTHER MD:  CHIEF COMPLAINT: Estrogen receptor positive breast cancer  CURRENT TREATMENT: Neoadjuvant chemotherapy and anti-HER-2 immunotherapy   BREAST CANCER HISTORY: From the original intake note  Kristine Parrish herself noted a change in her left breast and brought it to the attention of Dr. Nori Riis, who set her up for bilateral diagnostic mammography with tomography and left breast ultrasonography at the [facility] 08/09/2015. The breast density was category D. In the area of palpable concern there was a mass with irregular margins and architectural distortion, measuring approximately 3 cm. This was palpable in the upper outer quadrant near the nipple. The ultrasound confirmed an irregular hypoechoic mass approximately 3 cm from the nipple at the 10:00 position measuring 2.7 cm. Ultrasound of the left axilla found a single level I left axillary node with focal cortical thickening.  On 08/13/2015 the patient underwent biopsy of the left breast mass and the suspicious left axillary lymph node, both showing invasive ductal carcinoma, grade 2 or 3, estrogen receptor 100% positive, progesterone receptor 100% positive, both with strong staining intensity, with an MIB-1 of  50%, and HER-2 amplification, the signals ratio being 2.2 to and the number per cell 3.55  The patient's subsequent history is as detailed below  INTERVAL HISTORY: Kristine Parrish returns today for follow-up of her triple positive breast cancer. Today is day 17 cycle 1 of  carboplatin, docetaxel, trastuzumab, and pertuzumab, which the patient tolerated poorly. Accordingly she is being switched to weekly Abraxane with every 21 day trastuzumab for the upcoming treatment, scheduled for 10/03/2015.. If she does well with that we will consider adding back the pertuzumab with subsequent cycles.Marland Kitchen   REVIEW OF SYSTEMS: Kristine Parrish is recovering from her treatment and specifically the diarrhea is tapering off. She only had a small bowel movement this morning which she says was almost normal. She is eating better and taking Some of the weight she lost. Her energy is better. She still bruises easily, and has significant throbbing in her right foot. She had bunion surgery there October 2016, and then it didn't heal very well for a long time. Eventually after about 6 months of stimulator therapy, the bones have completely healed but she was left with ankle swelling. Actually she has ankle swelling on the left as well. She has peripheral neuropathy in both feet. She doesn't have it in her hands. A detailed review of systems today was otherwise noncontributory  PAST MEDICAL HISTORY: Past Medical History  Diagnosis Date  . Type 2 diabetes mellitus ([place])   . Peripheral neuropathy ([place])   . Peptic ulcer disease   . Hiatal hernia   . Colon polyps   . Breast cancer of upper-outer quadrant of left female breast ([place]) 08/15/2015  . Hypertension   . History of bronchitis   . GERD (gastroesophageal reflux disease)     PAST SURGICAL HISTORY: Past Surgical History  Procedure Laterality Date  . Vaginal hysterectomy  1995  . Cholecystectomy  1996  . Nerve removed from left foot  2000  . Appendectomy  1974  . Cataract surgery   04/2012  . Tubal ligation    . Foot surgery      left bunion removed  . Colonoscopy    . Portacath placement N/A 09/05/2015    Procedure: INSERTION PORT-A-CATH;  Surgeon: Autumn Messing III, MD;  Location: [facility] OR;  Service: General;  Laterality: N/A;    FAMILY HISTORY Family History  Problem Relation Age of Onset  . Diabetes Mother   . Hypertension Mother   . Alcoholism Maternal Grandfather   . Congestive Heart Failure Maternal Grandmother   The patient has little information about her father and is not sure of his cause of death or age at death. The patient's mother died at age 59. The patient had no siblings. She was raised by her grandmother. She is not aware of any breast or ovarian cancer history in the family   GYNECOLOGIC HISTORY:  No LMP recorded. Patient has had a hysterectomy.  menarche age 68, first live birth age 28. The patient is GX P2. She underwent hysterectomy with bilateral salpingo-oophorectomy in 1994, and has been on estrogen replacement since that time, discontinued June 2017.   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 She is divorced and lives alone, with 2 cats. Daughter [name] lives in [place] and works as a Teacher, music for the  [employer]. Son Kamara Allan lives in [place] where he is Secondary school teacher. The patient had one grandchild. She attends [employer]. The patient's daughter [name] is her healthcare power of attorney.     HEALTH MAINTENANCE: Social History  Substance Use Topics  . Smoking status: Never Smoker   . Smokeless tobacco: Never Used  . Alcohol Use: No     Colonoscopy: 2012/[facility]  PAP:  Bone density: 2015/"normal" at Dr. Verlon Au  Lipid panel:  No Known Allergies  Current Outpatient Prescriptions  Medication Sig Dispense Refill  . aspirin 81 MG tablet Take 81 mg by mouth daily.    Marland Kitchen gabapentin (NEURONTIN) 600 MG  tablet TAKE ONE TABLET BY MOUTH THREE TIMES DAILY  90 tablet 5  . hydrochlorothiazide (MICROZIDE) 12.5 MG capsule Take 12.5 mg by mouth daily.    Marland Kitchen HYDROcodone-acetaminophen (NORCO) 5-325 MG tablet Take 1-2 tablets by mouth every 6 (six) hours as needed. 30 tablet 0  . LORazepam (ATIVAN) 0.5 MG tablet Take 1 tablet (0.5 mg total) by mouth at bedtime as needed for anxiety. 30 tablet 0  . lovastatin (MEVACOR) 20 MG tablet Take 20 mg by mouth at bedtime.    . Multiple Vitamin (MULTIVITAMIN) tablet Take 1 tablet by mouth daily.    Marland Kitchen omeprazole (PRILOSEC) 20 MG capsule Take 20 mg by mouth daily.    . potassium chloride (MICRO-K) 10 MEQ CR capsule Take 2 capsules (20 mEq total) by mouth daily. 60 capsule 0  . Probiotic Product (PROBIOTIC DAILY PO) Take by mouth. Reported on 08/21/2015     No current facility-administered medications for this visit.    OBJECTIVE: Middle-aged white woman In no acute distress   Filed Vitals:   09/27/15 1547  BP: 135/73  Pulse: 98  Temp: 98.2 F (36.8 C)  Resp: 18     Body mass index is 27.84 kg/(m^2).    ECOG FS:1 -  Symptomatic but completely ambulatory  Sclerae unicteric, pupils round and equal Oropharynx clear and moist-- no thrush or other lesions No cervical or supraclavicular adenopathy Lungs no rales or rhonchi Heart regular rate and rhythm Abd soft, nontender, positive bowel sounds MSK no focal spinal tenderness, no upper extremity lymphedema Neuro: nonfocal, well oriented, appropriate affect Breasts: Deferred    LAB RESULTS:  CMP     Component Value Date/Time   NA 142 09/27/2015 1540   NA 139 09/03/2015 1542   K 3.2* 09/27/2015 1540   K 3.3* 09/03/2015 1542   CL 102 09/03/2015 1542   CO2 28 09/27/2015 1540   CO2 28 09/03/2015 1542   GLUCOSE 174* 09/27/2015 1540   GLUCOSE 161* 09/03/2015 1542   BUN 7.2 09/27/2015 1540   BUN 7 09/03/2015 1542   CREATININE 0.8 09/27/2015 1540   CREATININE 0.76 09/03/2015 1542   CALCIUM 8.5 09/27/2015  1540   CALCIUM 9.1 09/03/2015 1542   PROT 6.3* 09/27/2015 1540   PROT 7.0 03/16/2013 0831   ALBUMIN 3.2* 09/27/2015 1540   AST 22 09/27/2015 1540   ALT 32 09/27/2015 1540   ALKPHOS 136 09/27/2015 1540   BILITOT <0.30 09/27/2015 1540   GFRNONAA >60 09/03/2015 1542   GFRAA >60 09/03/2015 1542    INo results found for: SPEP, UPEP  Lab Results  Component Value Date   WBC 10.8* 09/27/2015   NEUTROABS 7.6* 09/27/2015   HGB 10.9* 09/27/2015   HCT 33.6* 09/27/2015   MCV 85.7 09/27/2015   PLT 176 09/27/2015      Chemistry      Component Value Date/Time   NA 142 09/27/2015 1540   NA 139 09/03/2015 1542   K 3.2* 09/27/2015 1540   K 3.3* 09/03/2015 1542   CL 102 09/03/2015 1542   CO2 28 09/27/2015 1540   CO2 28 09/03/2015 1542   BUN 7.2 09/27/2015 1540   BUN 7 09/03/2015 1542   CREATININE 0.8 09/27/2015 1540   CREATININE 0.76 09/03/2015 1542      Component Value Date/Time   CALCIUM 8.5 09/27/2015 1540   CALCIUM 9.1 09/03/2015 1542   ALKPHOS 136 09/27/2015 1540   AST 22 09/27/2015 1540   ALT 32 09/27/2015 1540   BILITOT <0.30 09/27/2015 1540       No results found for: LABCA2  No components found for: NOMVE720  No results for input(s): INR in the last 168 hours.  Urinalysis No results found for: COLORURINE, APPEARANCEUR, LABSPEC, PHURINE, GLUCOSEU, HGBUR, BILIRUBINUR, KETONESUR, PROTEINUR, UROBILINOGEN, NITRITE, LEUKOCYTESUR  STUDIES: Dg Chest Port 1 View  09/05/2015  CLINICAL DATA:  Right Port-A-Cath insertion EXAM: PORTABLE CHEST 1 VIEW COMPARISON:  None. FINDINGS: Cardiomediastinal silhouette is unremarkable. No acute infiltrate or pulmonary edema. There is right subclavian Port-A-Cath with tip in SVC. No pneumothorax. IMPRESSION: Right subclavian Port-A-Cath with tip in SVC.  No pneumothorax. Electronically Signed   By: Lahoma Crocker M.D.   On: 09/05/2015 09:17   Dg Fluoro Guide Cv Line-no Report  09/05/2015  CLINICAL DATA:  FLOURO GUIDE CV LINE Fluoroscopy was  utilized by the requesting physician.  No radiographic interpretation.    ELIGIBLE FOR AVAILABLE RESEARCH PROTOCOL: PALLAS, ALLIANCE  ASSESSMENT: 70 y.o. Kristine Parrish woman status post left breast upper outer quadrant and left axillary lymph node biopsy 08/13/2015 both positive for an invasive ductal carcinoma, grade 2 or 3, triple positive, with an MIB-1 of 50%  (1) neoadjuvant chemotherapy consisting of carboplatin, docetaxel, trastuzumab and pertuzumab given every 21 days Started 09/11/2015  (  a) treatment changed to Abraxane and trastuzumab with cycle 2 because of significant problems after cycle 1  (2) trastuzumab will be continued to complete a year  (a) most recent echocardiogram 09/09/2015 showed an ejection fraction of 60-65%   (3) definitive surgery to follow with TAD and consideration of the Alliance trial  (4) adjuvant radiation to follow as appropriate  (5) anti-estrogens to follow, with consideration of the PALLAS trial  PLAN: Kristine Parrish is thankfully recovering from her earlier treatment. Clearly we cannot go back to docetaxel and pertuzumab area did we are switching to weekly Abraxane, which she will take for 11 additional weeks, and trastuzumab, which she will receive every 21 days. We discussed the possible toxicities, side effects and complications of these agents, which are much better tolerated.  In particular she should not have any diarrhea problems from the trastuzumab and she should not have the nausea vomiting or mouth sores that she had from the docetaxel.  The concern with taxanes of course is peripheral neuropathy. She already has peripheral neuropathy involving both feet. She needs to alert [place] if this changes. We will be seeing her on a weekly basis to monitor this as closely as possible and if peripheral neuropathy does seem to worsen we will stop the Abraxane. (In that case we would consider cyclophosphamide and doxorubicin).  We discussed the nausea medications that  she needs to take with the current treatment, which is basically Compazine alone for 24 hours.  She will start her first dose of Abraxane July 27. She will see [place] the next week to make sure she tolerated it well. She will see me with each subsequent treatments until she completes her neoadjuvant therapy, after which she will have a repeat breast MRI and definitive surgery  Ge has a good understanding of this plan. She agrees with it. She knows the goal of treatment in her case is cure. She will call with any problems that may develop before her next visit hererry   Chauncey Cruel, MD   09/27/2015 6:11 PM Medical Oncology and Hematology [place]

## 2015-09-27 NOTE — Telephone Encounter (Signed)
per pof to sch pt appt-sent MW eail to sch trmt on 8/3-gave pt copy and adv I would call w/8/3 appt once reply

## 2015-09-30 ENCOUNTER — Ambulatory Visit: Payer: BLUE CROSS/BLUE SHIELD

## 2015-09-30 ENCOUNTER — Other Ambulatory Visit: Payer: BLUE CROSS/BLUE SHIELD

## 2015-10-03 ENCOUNTER — Encounter: Payer: Self-pay | Admitting: *Deleted

## 2015-10-03 ENCOUNTER — Ambulatory Visit (HOSPITAL_BASED_OUTPATIENT_CLINIC_OR_DEPARTMENT_OTHER): Payer: BLUE CROSS/BLUE SHIELD

## 2015-10-03 ENCOUNTER — Ambulatory Visit: Payer: BLUE CROSS/BLUE SHIELD | Admitting: Oncology

## 2015-10-03 ENCOUNTER — Other Ambulatory Visit (HOSPITAL_BASED_OUTPATIENT_CLINIC_OR_DEPARTMENT_OTHER): Payer: BLUE CROSS/BLUE SHIELD

## 2015-10-03 ENCOUNTER — Other Ambulatory Visit: Payer: BLUE CROSS/BLUE SHIELD

## 2015-10-03 VITALS — BP 131/58 | HR 77 | Temp 98.1°F | Resp 18

## 2015-10-03 DIAGNOSIS — Z5112 Encounter for antineoplastic immunotherapy: Secondary | ICD-10-CM | POA: Diagnosis not present

## 2015-10-03 DIAGNOSIS — C773 Secondary and unspecified malignant neoplasm of axilla and upper limb lymph nodes: Secondary | ICD-10-CM | POA: Diagnosis not present

## 2015-10-03 DIAGNOSIS — C50412 Malignant neoplasm of upper-outer quadrant of left female breast: Secondary | ICD-10-CM

## 2015-10-03 DIAGNOSIS — Z5111 Encounter for antineoplastic chemotherapy: Secondary | ICD-10-CM

## 2015-10-03 LAB — CBC WITH DIFFERENTIAL/PLATELET
BASO%: 0.8 % (ref 0.0–2.0)
Basophils Absolute: 0.1 10*3/uL (ref 0.0–0.1)
EOS%: 1 % (ref 0.0–7.0)
Eosinophils Absolute: 0.1 10*3/uL (ref 0.0–0.5)
HCT: 34.7 % — ABNORMAL LOW (ref 34.8–46.6)
HGB: 11.3 g/dL — ABNORMAL LOW (ref 11.6–15.9)
LYMPH%: 25.9 % (ref 14.0–49.7)
MCH: 27.9 pg (ref 25.1–34.0)
MCHC: 32.5 g/dL (ref 31.5–36.0)
MCV: 85.8 fL (ref 79.5–101.0)
MONO#: 0.7 10*3/uL (ref 0.1–0.9)
MONO%: 8.9 % (ref 0.0–14.0)
NEUT#: 4.9 10*3/uL (ref 1.5–6.5)
NEUT%: 63.4 % (ref 38.4–76.8)
Platelets: 303 10*3/uL (ref 145–400)
RBC: 4.04 10*6/uL (ref 3.70–5.45)
RDW: 14.7 % — ABNORMAL HIGH (ref 11.2–14.5)
WBC: 7.8 10*3/uL (ref 3.9–10.3)
lymph#: 2 10*3/uL (ref 0.9–3.3)

## 2015-10-03 LAB — COMPREHENSIVE METABOLIC PANEL
ALT: 25 U/L (ref 0–55)
AST: 18 U/L (ref 5–34)
Albumin: 3.3 g/dL — ABNORMAL LOW (ref 3.5–5.0)
Alkaline Phosphatase: 140 U/L (ref 40–150)
Anion Gap: 10 mEq/L (ref 3–11)
BUN: 7.1 mg/dL (ref 7.0–26.0)
CO2: 26 mEq/L (ref 22–29)
Calcium: 9.1 mg/dL (ref 8.4–10.4)
Chloride: 103 mEq/L (ref 98–109)
Creatinine: 0.8 mg/dL (ref 0.6–1.1)
EGFR: 73 mL/min/{1.73_m2} — ABNORMAL LOW (ref >90)
Glucose: 202 mg/dl — ABNORMAL HIGH (ref 70–140)
Potassium: 4.1 mEq/L (ref 3.5–5.1)
Sodium: 140 mEq/L (ref 136–145)
Total Bilirubin: <0.3 mg/dL (ref 0.20–1.20)
Total Protein: 6.5 g/dL (ref 6.4–8.3)

## 2015-10-03 MED ORDER — DIPHENHYDRAMINE HCL 25 MG PO CAPS
25.0000 mg | ORAL_CAPSULE | Freq: Once | ORAL | Status: AC
  Administered 2015-10-03: 25 mg via ORAL

## 2015-10-03 MED ORDER — DIPHENHYDRAMINE HCL 25 MG PO CAPS
ORAL_CAPSULE | ORAL | Status: AC
  Filled 2015-10-03: qty 1

## 2015-10-03 MED ORDER — ACETAMINOPHEN 325 MG PO TABS
650.0000 mg | ORAL_TABLET | Freq: Once | ORAL | Status: AC
  Administered 2015-10-03: 650 mg via ORAL

## 2015-10-03 MED ORDER — SODIUM CHLORIDE 0.9 % IV SOLN
Freq: Once | INTRAVENOUS | Status: AC
  Administered 2015-10-03: 10:00:00 via INTRAVENOUS

## 2015-10-03 MED ORDER — SODIUM CHLORIDE 0.9 % IV SOLN: Freq: Once | INTRAVENOUS | Status: DC

## 2015-10-03 MED ORDER — PROCHLORPERAZINE MALEATE 10 MG PO TABS
10.0000 mg | ORAL_TABLET | Freq: Once | ORAL | Status: AC
  Administered 2015-10-03: 10 mg via ORAL

## 2015-10-03 MED ORDER — HEPARIN SOD (PORK) LOCK FLUSH 100 UNIT/ML IV SOLN
500.0000 [IU] | Freq: Once | INTRAVENOUS | Status: AC | PRN
  Administered 2015-10-03: 500 [IU]
  Filled 2015-10-03: qty 5

## 2015-10-03 MED ORDER — ACETAMINOPHEN 325 MG PO TABS
ORAL_TABLET | ORAL | Status: AC
  Filled 2015-10-03: qty 2

## 2015-10-03 MED ORDER — SODIUM CHLORIDE 0.9% FLUSH
10.0000 mL | INTRAVENOUS | Status: DC | PRN
  Administered 2015-10-03: 10 mL
  Filled 2015-10-03: qty 10

## 2015-10-03 MED ORDER — PROCHLORPERAZINE MALEATE 10 MG PO TABS
ORAL_TABLET | ORAL | Status: AC
  Filled 2015-10-03: qty 1

## 2015-10-03 MED ORDER — TRASTUZUMAB CHEMO 150 MG IV SOLR
450.0000 mg | Freq: Once | INTRAVENOUS | Status: AC
  Administered 2015-10-03: 450 mg via INTRAVENOUS
  Filled 2015-10-03: qty 21.43

## 2015-10-03 MED ORDER — PACLITAXEL PROTEIN-BOUND CHEMO INJECTION 100 MG
100.0000 mg/m2 | Freq: Once | INTRAVENOUS | Status: AC
  Administered 2015-10-03: 175 mg via INTRAVENOUS
  Filled 2015-10-03: qty 35

## 2015-10-03 NOTE — Patient Instructions (Signed)
[facility] Discharge Instructions for Patients Receiving Chemotherapy  Today you received the following chemotherapy agents: Abraxane and Herceptin.  To help prevent nausea and vomiting after your treatment, we encourage you to take your nausea medication as directed. If you develop nausea and vomiting that is not controlled by your nausea medication, call the clinic.   BELOW ARE SYMPTOMS THAT SHOULD BE REPORTED IMMEDIATELY:  *FEVER GREATER THAN 100.5 F  *CHILLS WITH OR WITHOUT FEVER  NAUSEA AND VOMITING THAT IS NOT CONTROLLED WITH YOUR NAUSEA MEDICATION      *UNUSUAL SHORTNESS OF BREATHNanoparticle Albumin-Bound Paclitaxel injection What is this medicine? NANOPARTICLE ALBUMIN-BOUND PACLITAXEL (Na no PAHR ti kuhl al BYOO muhn-bound PAK li TAX el) is a chemotherapy drug. It targets fast dividing cells, like cancer cells, and causes these cells to die. This medicine is used to treat advanced breast cancer and advanced lung cancer. This medicine may be used for other purposes; ask your health care provider or pharmacist if you have questions. What should I tell my health care provider before I take this medicine? They need to know if you have any of these conditions: -kidney disease -liver disease -low blood counts, like low platelets, red blood cells, or white blood cells -recent or ongoing radiation therapy -an unusual or allergic reaction to paclitaxel, albumin, other chemotherapy, other medicines, foods, dyes, or preservatives -pregnant or trying to get pregnant -breast-feeding How should I use this medicine? This drug is given as an infusion into a vein. It is administered in a hospital or clinic by a specially trained health care professional. Talk to your pediatrician regarding the use of this medicine in children. Special care may be needed. Overdosage: If you think you have taken too much of this medicine contact a poison control center or emergency  room at once. NOTE: This medicine is only for you. Do not share this medicine with others. What if I miss a dose? It is important not to miss your dose. Call your doctor or health care professional if you are unable to keep an appointment. What may interact with this medicine? -cyclosporine -diazepam -ketoconazole -medicines to increase blood counts like filgrastim, pegfilgrastim, sargramostim -other chemotherapy drugs like cisplatin, doxorubicin, epirubicin, etoposide, teniposide, vincristine -quinidine -testosterone -vaccines -verapamil Talk to your doctor or health care professional before taking any of these medicines: -acetaminophen -aspirin -ibuprofen -ketoprofen -naproxen This list may not describe all possible interactions. Give your health care provider a list of all the medicines, herbs, non-prescription drugs, or dietary supplements you use. Also tell them if you smoke, drink alcohol, or use illegal drugs. Some items may interact with your medicine. What should I watch for while using this medicine? Your condition will be monitored carefully while you are receiving this medicine. You will need important blood work done while you are taking this medicine. This drug may make you feel generally unwell. This is not uncommon, as chemotherapy can affect healthy cells as well as cancer cells. Report any side effects. Continue your course of treatment even though you feel ill unless your doctor tells you to stop. In some cases, you may be given additional medicines to help with side effects. Follow all directions for their use. Call your doctor or health care professional for advice if you get a fever, chills or sore throat, or other symptoms of a cold or flu. Do not treat yourself. This drug decreases your body's ability to fight infections. Try to avoid being around people who  are sick. This medicine may increase your risk to bruise or bleed. Call your doctor or health care  professional if you notice any unusual bleeding. Be careful brushing and flossing your teeth or using a toothpick because you may get an infection or bleed more easily. If you have any dental work done, tell your dentist you are receiving this medicine. Avoid taking products that contain aspirin, acetaminophen, ibuprofen, naproxen, or ketoprofen unless instructed by your doctor. These medicines may hide a fever. Do not become pregnant while taking this medicine. Women should inform their doctor if they wish to become pregnant or think they might be pregnant. There is a potential for serious side effects to an unborn child. Talk to your health care professional or pharmacist for more information. Do not breast-feed an infant while taking this medicine. Men are advised not to father a child while receiving this medicine. What side effects may I notice from receiving this medicine? Side effects that you should report to your doctor or health care professional as soon as possible: -allergic reactions like skin rash, itching or hives, swelling of the face, lips, or tongue -low blood counts - This drug may decrease the number of white blood cells, red blood cells and platelets. You may be at increased risk for infections and bleeding. -signs of infection - fever or chills, cough, sore throat, pain or difficulty passing urine -signs of decreased platelets or bleeding - bruising, pinpoint red spots on the skin, black, tarry stools, nosebleeds -signs of decreased red blood cells - unusually weak or tired, fainting spells, lightheadedness -breathing problems -changes in vision -chest pain -high or low blood pressure -mouth sores -nausea and vomiting -pain, swelling, redness or irritation at the injection site -pain, tingling, numbness in the hands or feet -slow or irregular heartbeat -swelling of the ankle, feet, hands Side effects that usually do not require medical attention (report to your doctor or  health care professional if they continue or are bothersome): -aches, pains -changes in the color of fingernails -diarrhea -hair loss -loss of appetite This list may not describe all possible side effects. Call your doctor for medical advice about side effects. You may report side effects to FDA at 1-800-FDA-1088. Where should I keep my medicine? This drug is given in a hospital or clinic and will not be stored at home. NOTE: This sheet is a summary. It may not cover all possible information. If you have questions about this medicine, talk to your doctor, pharmacist, or health care provider.    2016, Elsevier/Gold Standard. (2012-04-18 16:48:50)    *UNUSUAL BRUISING OR BLEEDING  TENDERNESS IN MOUTH AND THROAT WITH OR WITHOUT PRESENCE OF ULCERS  *URINARY PROBLEMS  *BOWEL PROBLEMS  UNUSUAL RASH Items with * indicate a potential emergency and should be followed up as soon as possible.  Feel free to call the clinic you have any questions or concerns. The clinic phone number is [phone].  Please show the [place] at check-in to the Emergency Department and triage nurse.

## 2015-10-07 ENCOUNTER — Telehealth: Payer: Self-pay | Admitting: Hematology and Oncology

## 2015-10-07 NOTE — Telephone Encounter (Signed)
[  per pof to sch pt appt-cld pt per reply from MW to give sch for 8/3@2 :15 lab/MD/inf

## 2015-10-10 ENCOUNTER — Other Ambulatory Visit (HOSPITAL_BASED_OUTPATIENT_CLINIC_OR_DEPARTMENT_OTHER): Payer: BLUE CROSS/BLUE SHIELD

## 2015-10-10 ENCOUNTER — Ambulatory Visit (HOSPITAL_BASED_OUTPATIENT_CLINIC_OR_DEPARTMENT_OTHER): Payer: BLUE CROSS/BLUE SHIELD | Admitting: Hematology and Oncology

## 2015-10-10 ENCOUNTER — Ambulatory Visit (HOSPITAL_BASED_OUTPATIENT_CLINIC_OR_DEPARTMENT_OTHER): Payer: BLUE CROSS/BLUE SHIELD

## 2015-10-10 ENCOUNTER — Encounter: Payer: Self-pay | Admitting: Hematology and Oncology

## 2015-10-10 DIAGNOSIS — Z5111 Encounter for antineoplastic chemotherapy: Secondary | ICD-10-CM

## 2015-10-10 DIAGNOSIS — C773 Secondary and unspecified malignant neoplasm of axilla and upper limb lymph nodes: Secondary | ICD-10-CM

## 2015-10-10 DIAGNOSIS — Z170 Estrogen receptor positive status [ER+]: Secondary | ICD-10-CM | POA: Diagnosis not present

## 2015-10-10 DIAGNOSIS — C50412 Malignant neoplasm of upper-outer quadrant of left female breast: Secondary | ICD-10-CM

## 2015-10-10 DIAGNOSIS — G620 Drug-induced polyneuropathy: Secondary | ICD-10-CM

## 2015-10-10 LAB — CBC WITH DIFFERENTIAL/PLATELET
BASO%: 0.5 % (ref 0.0–2.0)
Basophils Absolute: 0 10*3/uL (ref 0.0–0.1)
EOS%: 4.5 % (ref 0.0–7.0)
Eosinophils Absolute: 0.3 10*3/uL (ref 0.0–0.5)
HCT: 33.5 % — ABNORMAL LOW (ref 34.8–46.6)
HGB: 10.8 g/dL — ABNORMAL LOW (ref 11.6–15.9)
LYMPH%: 32.2 % (ref 14.0–49.7)
MCH: 27.8 pg (ref 25.1–34.0)
MCHC: 32.2 g/dL (ref 31.5–36.0)
MCV: 86.1 fL (ref 79.5–101.0)
MONO#: 0.3 10*3/uL (ref 0.1–0.9)
MONO%: 3.7 % (ref 0.0–14.0)
NEUT#: 4.4 10*3/uL (ref 1.5–6.5)
NEUT%: 59.1 % (ref 38.4–76.8)
Platelets: 230 10*3/uL (ref 145–400)
RBC: 3.89 10*6/uL (ref 3.70–5.45)
RDW: 14.4 % (ref 11.2–14.5)
WBC: 7.4 10*3/uL (ref 3.9–10.3)
lymph#: 2.4 10*3/uL (ref 0.9–3.3)

## 2015-10-10 LAB — COMPREHENSIVE METABOLIC PANEL
ALT: 27 U/L (ref 0–55)
AST: 24 U/L (ref 5–34)
Albumin: 3.4 g/dL — ABNORMAL LOW (ref 3.5–5.0)
Alkaline Phosphatase: 136 U/L (ref 40–150)
Anion Gap: 10 mEq/L (ref 3–11)
BUN: 9.1 mg/dL (ref 7.0–26.0)
CO2: 26 mEq/L (ref 22–29)
Calcium: 9.3 mg/dL (ref 8.4–10.4)
Chloride: 102 mEq/L (ref 98–109)
Creatinine: 0.8 mg/dL (ref 0.6–1.1)
EGFR: 75 mL/min/{1.73_m2} — ABNORMAL LOW (ref >90)
Glucose: 220 mg/dl — ABNORMAL HIGH (ref 70–140)
Potassium: 3.7 mEq/L (ref 3.5–5.1)
Sodium: 138 mEq/L (ref 136–145)
Total Bilirubin: 0.34 mg/dL (ref 0.20–1.20)
Total Protein: 6.7 g/dL (ref 6.4–8.3)

## 2015-10-10 MED ORDER — SODIUM CHLORIDE 0.9 % IV SOLN
Freq: Once | INTRAVENOUS | Status: AC
  Administered 2015-10-10: 16:00:00 via INTRAVENOUS

## 2015-10-10 MED ORDER — PROCHLORPERAZINE MALEATE 10 MG PO TABS
ORAL_TABLET | ORAL | Status: AC
  Filled 2015-10-10: qty 1

## 2015-10-10 MED ORDER — PROCHLORPERAZINE MALEATE 10 MG PO TABS
10.0000 mg | ORAL_TABLET | Freq: Once | ORAL | Status: AC
  Administered 2015-10-10: 10 mg via ORAL

## 2015-10-10 MED ORDER — PACLITAXEL PROTEIN-BOUND CHEMO INJECTION 100 MG
100.0000 mg/m2 | Freq: Once | INTRAVENOUS | Status: AC
  Administered 2015-10-10: 175 mg via INTRAVENOUS
  Filled 2015-10-10: qty 35

## 2015-10-10 MED ORDER — SODIUM CHLORIDE 0.9% FLUSH
10.0000 mL | INTRAVENOUS | Status: DC | PRN
  Administered 2015-10-10: 10 mL
  Filled 2015-10-10: qty 10

## 2015-10-10 MED ORDER — HEPARIN SOD (PORK) LOCK FLUSH 100 UNIT/ML IV SOLN
500.0000 [IU] | Freq: Once | INTRAVENOUS | Status: AC | PRN
  Administered 2015-10-10: 500 [IU]
  Filled 2015-10-10: qty 5

## 2015-10-10 NOTE — Patient Instructions (Signed)
[facility] Discharge Instructions for Patients Receiving Chemotherapy  Today you received the following chemotherapy agents Abraxane To help prevent nausea and vomiting after your treatment, we encourage you to take your nausea medication as prescribed.   If you develop nausea and vomiting that is not controlled by your nausea medication, call the clinic.   BELOW ARE SYMPTOMS THAT SHOULD BE REPORTED IMMEDIATELY:  *FEVER GREATER THAN 100.5 F  *CHILLS WITH OR WITHOUT FEVER  NAUSEA AND VOMITING THAT IS NOT CONTROLLED WITH YOUR NAUSEA MEDICATION  *UNUSUAL SHORTNESS OF BREATH  *UNUSUAL BRUISING OR BLEEDING  TENDERNESS IN MOUTH AND THROAT WITH OR WITHOUT PRESENCE OF ULCERS  *URINARY PROBLEMS  *BOWEL PROBLEMS  UNUSUAL RASH Items with * indicate a potential emergency and should be followed up as soon as possible.  Feel free to call the clinic you have any questions or concerns. The clinic phone number is [phone].  Please show the CHEMO ALERT CARD at check-in to the Emergency Department and triage nurse.   

## 2015-10-10 NOTE — Progress Notes (Signed)
Patient Care Team: Orpah Melter, MD as PCP - General (Family Medicine) Autumn Messing III, MD as Consulting Physician (General Surgery) Chauncey Cruel, MD as Consulting Physician (Oncology) Eppie Gibson, MD as Attending Physician (Radiation Oncology) Murvin Donning, MD (Dermatology) Laurence Spates, MD as Consulting Physician (Gastroenterology) Rosemary Holms, DPM as Consulting Physician (Podiatry) Viona Gilmore Evette Cristal, MD as Consulting Physician (Obstetrics and Gynecology) Larey Dresser, MD as Consulting Physician (Cardiology) Benson Norway, RN as Registered Nurse  DIAGNOSIS: Breast cancer of upper-outer quadrant of left female breast [facility])   Staging form: Breast, AJCC 7th Edition   - Clinical stage from 08/21/2015: Stage IIB (T2, N1, M0) - Unsigned  CHIEF COMPLAINT: Triple positive breast cancer  CURRENT TREATMENT: Abraxane Herceptin, week 2 Abraxane   BREAST CANCER HISTORY: From the original intake note  Eldoris herself noted a change in her left breast and brought it to the attention of Dr. Nori Riis, who set her up for bilateral diagnostic mammography with tomography and left breast ultrasonography at the [facility] 08/09/2015. The breast density was category D. In the area of palpable concern there was a mass with irregular margins and architectural distortion, measuring approximately 3 cm. This was palpable in the upper outer quadrant near the nipple. The ultrasound confirmed an irregular hypoechoic mass approximately 3 cm from the nipple at the 10:00 position measuring 2.7 cm. Ultrasound of the left axilla found a single level I left axillary node with focal cortical thickening.  On 08/13/2015 the patient underwent biopsy of the left breast mass and the suspicious left axillary lymph node, both showing invasive ductal carcinoma, grade 2 or 3, estrogen receptor 100% positive, progesterone receptor 100% positive, both with strong staining intensity, with an MIB-1 of 50%, and HER-2  amplification, the signals ratio being 2.2 to and the number per cell 3.55  The patient's subsequent history is as detailed below  INTERVAL HISTORY: Patient is here for cycle 2 of weekly Abraxane. Last week she received Abraxane Herceptin had done extremely well. She did not have any nausea or vomiting. She did not have any mouth sores or any other discomfort. Diarrhea has subsided. Energy levels have improved.  REVIEW OF SYSTEMS:   Constitutional: Denies fevers, chills or abnormal weight loss, alopecia Eyes: Denies blurriness of vision Ears, nose, mouth, throat, and face: Denies mucositis or sore throat Respiratory: Denies cough, dyspnea or wheezes Cardiovascular: Denies palpitation, chest discomfort Gastrointestinal:  Denies nausea, heartburn or change in bowel habits Skin: Denies abnormal skin rashes Lymphatics: Denies new lymphadenopathy or easy bruising Neurological:Denies numbness, tingling or new weaknesses Behavioral/Psych: Mood is stable, no new changes  Extremities: No lower extremity edema  All other systems were reviewed with the patient and are negative.  I have reviewed the past medical history, past surgical history, social history and family history with the patient and they are unchanged from previous note.  ALLERGIES:  has No Known Allergies.  MEDICATIONS:  Current Outpatient Prescriptions  Medication Sig Dispense Refill  . aspirin 81 MG tablet Take 81 mg by mouth daily.    Marland Kitchen gabapentin (NEURONTIN) 600 MG tablet TAKE ONE TABLET BY MOUTH THREE TIMES DAILY  90 tablet 5  . hydrochlorothiazide (MICROZIDE) 12.5 MG capsule Take 12.5 mg by mouth daily.    Marland Kitchen HYDROcodone-acetaminophen (NORCO) 5-325 MG tablet Take 1-2 tablets by mouth every 6 (six) hours as needed. 30 tablet 0  . LORazepam (ATIVAN) 0.5 MG tablet Take 1 tablet (0.5 mg total) by mouth at bedtime as needed for  anxiety. 30 tablet 0  . lovastatin (MEVACOR) 20 MG tablet Take 20 mg by mouth at bedtime.    .  Multiple Vitamin (MULTIVITAMIN) tablet Take 1 tablet by mouth daily.    Marland Kitchen omeprazole (PRILOSEC) 20 MG capsule Take 20 mg by mouth daily.    . potassium chloride (MICRO-K) 10 MEQ CR capsule Take 2 capsules (20 mEq total) by mouth daily. 60 capsule 0  . Probiotic Product (PROBIOTIC DAILY PO) Take by mouth. Reported on 08/21/2015     No current facility-administered medications for this visit.     PHYSICAL EXAMINATION: ECOG PERFORMANCE STATUS: 1 - Symptomatic but completely ambulatory  Vitals:   10/10/15 1436  BP: (!) 144/87  Pulse: 96  Resp: 18  Temp: 98.6 F (37 C)   Filed Weights   10/10/15 1436  Weight: 167 lb 11.2 oz (76.1 kg)    GENERAL:alert, no distress and comfortable SKIN: skin color, texture, turgor are normal, no rashes or significant lesions EYES: normal, Conjunctiva are pink and non-injected, sclera clear OROPHARYNX:no exudate, no erythema and lips, buccal mucosa, and tongue normal  NECK: supple, thyroid normal size, non-tender, without nodularity LYMPH:  no palpable lymphadenopathy in the cervical, axillary or inguinal LUNGS: clear to auscultation and percussion with normal breathing effort HEART: regular rate & rhythm and no murmurs and no lower extremity edema ABDOMEN:abdomen soft, non-tender and normal bowel sounds MUSCULOSKELETAL:no cyanosis of digits and no clubbing  NEURO: alert & oriented x 3 with fluent speech, no focal motor/sensory deficits EXTREMITIES: No lower extremity edema  LABORATORY DATA:  I have reviewed the data as listed   Chemistry      Component Value Date/Time   NA 140 10/03/2015 0933   K 4.1 10/03/2015 0933   CL 102 09/03/2015 1542   CO2 26 10/03/2015 0933   BUN 7.1 10/03/2015 0933   CREATININE 0.8 10/03/2015 0933      Component Value Date/Time   CALCIUM 9.1 10/03/2015 0933   ALKPHOS 140 10/03/2015 0933   AST 18 10/03/2015 0933   ALT 25 10/03/2015 0933   BILITOT <0.30 10/03/2015 0933       Lab Results  Component Value Date    WBC 7.4 10/10/2015   HGB 10.8 (L) 10/10/2015   HCT 33.5 (L) 10/10/2015   MCV 86.1 10/10/2015   PLT 230 10/10/2015   NEUTROABS 4.4 10/10/2015     ASSESSMENT & PLAN:  Breast cancer of upper-outer quadrant of left female breast (HCC) ASSESSMENT: 70 y.o. [place] woman status post left breast upper outer quadrant and left axillary lymph node biopsy 08/13/2015 both positive for an invasive ductal carcinoma, grade 2 or 3, triple positive, with an MIB-1 of 50%  (1) neoadjuvant chemotherapy consisting of carboplatin, docetaxel, trastuzumab and pertuzumab given every 21 days Started 09/11/2015                       (a) treatment changed to Abraxane and trastuzumab with cycle 2 because of significant problems after cycle 1  (2) trastuzumab will be continued to complete a year                       (a) most recent echocardiogram 09/09/2015 showed an ejection fraction of 60-65%   (3) definitive surgery to follow with TAD and consideration of the Alliance trial  (4) adjuvant radiation to follow as appropriate  (5) anti-estrogens to follow, with consideration of the PALLAS trial  PLAN: 1. weekly Abraxane, which she  will take for 11 additional weeks, and trastuzumab, which she will receive every 21 days. She has tolerated first week of Abraxane extremely well. She does not have any further episodes of diarrhea or nausea or vomiting.  2. Chemotherapy-induced peripheral neuropathy: We will be seeing her on a weekly basis to monitor this as closely as possible and if peripheral neuropathy does seem to worsen we will stop the Abraxane. (In that case we would consider cyclophosphamide and doxorubicin). Patient already has bilateral lower extremity peripheral neuropathy up to the level of the ankles. She has not noticed any numbness in her hands.  I did discuss with the patient she may want to discuss with Dr. Jana Hakim regarding bringing back Perjeta again for another attempt with more  aggressive diarrhea treatment with Lomotil and Imodium.  After she completes her neoadjuvant therapy, she will have a repeat breast MRI and definitive surgery  No orders of the defined types were placed in this encounter.  The patient has a good understanding of the overall plan. she agrees with it. she will call with any problems that may develop before the next visit here.   Rulon Eisenmenger, MD 10/10/15

## 2015-10-10 NOTE — Assessment & Plan Note (Signed)
ASSESSMENT: 70 y.o. [place] woman status post left breast upper outer quadrant and left axillary lymph node biopsy 08/13/2015 both positive for an invasive ductal carcinoma, grade 2 or 3, triple positive, with an MIB-1 of 50%  (1) neoadjuvant chemotherapy consisting of carboplatin, docetaxel, trastuzumab and pertuzumab given every 21 days Started 09/11/2015                       (a) treatment changed to Abraxane and trastuzumab with cycle 2 because of significant problems after cycle 1  (2) trastuzumab will be continued to complete a year                       (a) most recent echocardiogram 09/09/2015 showed an ejection fraction of 60-65%   (3) definitive surgery to follow with TAD and consideration of the Alliance trial  (4) adjuvant radiation to follow as appropriate  (5) anti-estrogens to follow, with consideration of the PALLAS trial  PLAN: 1. weekly Abraxane, which she will take for 11 additional weeks, and trastuzumab, which she will receive every 21 days.  2. Chemotherapy-induced peripheral neuropathy: We will be seeing her on a weekly basis to monitor this as closely as possible and if peripheral neuropathy does seem to worsen we will stop the Abraxane. (In that case we would consider cyclophosphamide and doxorubicin).  After she completes her neoadjuvant therapy, she will have a repeat breast MRI and definitive surgery

## 2015-10-16 ENCOUNTER — Other Ambulatory Visit: Payer: Self-pay | Admitting: Oncology

## 2015-10-16 DIAGNOSIS — Z01419 Encounter for gynecological examination (general) (routine) without abnormal findings: Secondary | ICD-10-CM | POA: Diagnosis not present

## 2015-10-16 DIAGNOSIS — Z6828 Body mass index (BMI) 28.0-28.9, adult: Secondary | ICD-10-CM | POA: Diagnosis not present

## 2015-10-17 ENCOUNTER — Telehealth: Payer: Self-pay | Admitting: *Deleted

## 2015-10-17 ENCOUNTER — Other Ambulatory Visit (HOSPITAL_BASED_OUTPATIENT_CLINIC_OR_DEPARTMENT_OTHER): Payer: BLUE CROSS/BLUE SHIELD

## 2015-10-17 ENCOUNTER — Ambulatory Visit (HOSPITAL_BASED_OUTPATIENT_CLINIC_OR_DEPARTMENT_OTHER): Payer: BLUE CROSS/BLUE SHIELD

## 2015-10-17 ENCOUNTER — Encounter: Payer: Self-pay | Admitting: *Deleted

## 2015-10-17 ENCOUNTER — Ambulatory Visit (HOSPITAL_BASED_OUTPATIENT_CLINIC_OR_DEPARTMENT_OTHER): Payer: BLUE CROSS/BLUE SHIELD | Admitting: Oncology

## 2015-10-17 VITALS — BP 147/80 | HR 95 | Temp 98.0°F | Resp 18 | Ht 65.0 in | Wt 168.3 lb

## 2015-10-17 DIAGNOSIS — C50412 Malignant neoplasm of upper-outer quadrant of left female breast: Secondary | ICD-10-CM

## 2015-10-17 DIAGNOSIS — Z170 Estrogen receptor positive status [ER+]: Secondary | ICD-10-CM | POA: Diagnosis not present

## 2015-10-17 DIAGNOSIS — Z5111 Encounter for antineoplastic chemotherapy: Secondary | ICD-10-CM

## 2015-10-17 DIAGNOSIS — C773 Secondary and unspecified malignant neoplasm of axilla and upper limb lymph nodes: Secondary | ICD-10-CM | POA: Diagnosis not present

## 2015-10-17 LAB — CBC WITH DIFFERENTIAL/PLATELET
BASO%: 0.6 % (ref 0.0–2.0)
Basophils Absolute: 0 10*3/uL (ref 0.0–0.1)
EOS%: 3.3 % (ref 0.0–7.0)
Eosinophils Absolute: 0.2 10*3/uL (ref 0.0–0.5)
HCT: 32.5 % — ABNORMAL LOW (ref 34.8–46.6)
HGB: 10.6 g/dL — ABNORMAL LOW (ref 11.6–15.9)
LYMPH%: 38.8 % (ref 14.0–49.7)
MCH: 28 pg (ref 25.1–34.0)
MCHC: 32.6 g/dL (ref 31.5–36.0)
MCV: 86 fL (ref 79.5–101.0)
MONO#: 0.2 10*3/uL (ref 0.1–0.9)
MONO%: 4.5 % (ref 0.0–14.0)
NEUT#: 2.7 10*3/uL (ref 1.5–6.5)
NEUT%: 52.8 % (ref 38.4–76.8)
Platelets: 185 10*3/uL (ref 145–400)
RBC: 3.78 10*6/uL (ref 3.70–5.45)
RDW: 14.6 % — ABNORMAL HIGH (ref 11.2–14.5)
WBC: 5.2 10*3/uL (ref 3.9–10.3)
lymph#: 2 10*3/uL (ref 0.9–3.3)

## 2015-10-17 LAB — COMPREHENSIVE METABOLIC PANEL
ALT: 25 U/L (ref 0–55)
AST: 21 U/L (ref 5–34)
Albumin: 3.3 g/dL — ABNORMAL LOW (ref 3.5–5.0)
Alkaline Phosphatase: 122 U/L (ref 40–150)
Anion Gap: 9 mEq/L (ref 3–11)
BUN: 9.9 mg/dL (ref 7.0–26.0)
CO2: 29 mEq/L (ref 22–29)
Calcium: 9.2 mg/dL (ref 8.4–10.4)
Chloride: 101 mEq/L (ref 98–109)
Creatinine: 0.7 mg/dL (ref 0.6–1.1)
EGFR: 81 mL/min/{1.73_m2} — ABNORMAL LOW (ref >90)
Glucose: 177 mg/dl — ABNORMAL HIGH (ref 70–140)
Potassium: 3 mEq/L — CL (ref 3.5–5.1)
Sodium: 139 mEq/L (ref 136–145)
Total Bilirubin: 0.33 mg/dL (ref 0.20–1.20)
Total Protein: 6.7 g/dL (ref 6.4–8.3)

## 2015-10-17 MED ORDER — HEPARIN SOD (PORK) LOCK FLUSH 100 UNIT/ML IV SOLN
500.0000 [IU] | Freq: Once | INTRAVENOUS | Status: AC | PRN
  Administered 2015-10-17: 500 [IU]
  Filled 2015-10-17: qty 5

## 2015-10-17 MED ORDER — PACLITAXEL PROTEIN-BOUND CHEMO INJECTION 100 MG
100.0000 mg/m2 | Freq: Once | INTRAVENOUS | Status: AC
  Administered 2015-10-17: 175 mg via INTRAVENOUS
  Filled 2015-10-17: qty 35

## 2015-10-17 MED ORDER — PROCHLORPERAZINE MALEATE 10 MG PO TABS
ORAL_TABLET | ORAL | Status: AC
  Filled 2015-10-17: qty 1

## 2015-10-17 MED ORDER — SODIUM CHLORIDE 0.9% FLUSH
10.0000 mL | INTRAVENOUS | Status: DC | PRN
  Administered 2015-10-17: 10 mL
  Filled 2015-10-17: qty 10

## 2015-10-17 MED ORDER — SODIUM CHLORIDE 0.9 % IV SOLN
Freq: Once | INTRAVENOUS | Status: AC
  Administered 2015-10-17: 15:00:00 via INTRAVENOUS

## 2015-10-17 MED ORDER — PROCHLORPERAZINE MALEATE 10 MG PO TABS
10.0000 mg | ORAL_TABLET | Freq: Once | ORAL | Status: AC
  Administered 2015-10-17: 10 mg via ORAL

## 2015-10-17 NOTE — Telephone Encounter (Signed)
Per lab draw today k+ level decreased to 3.0.  Per inquiry pt states she had stopped taking micro K at home.  Per discussion- plan is for pt to take 20 mEq bid x 3 days then decrease to 20 mEq daily until lab is rechecked.

## 2015-10-17 NOTE — Patient Instructions (Addendum)
[facility] Discharge Instructions for Patients Receiving Chemotherapy  Today you received the following chemotherapy agents:  Abraxane.  To help prevent nausea and vomiting after your treatment, we encourage you to take your nausea medication as directed.   If you develop nausea and vomiting that is not controlled by your nausea medication, call the clinic.   BELOW ARE SYMPTOMS THAT SHOULD BE REPORTED IMMEDIATELY:  *FEVER GREATER THAN 100.5 F  *CHILLS WITH OR WITHOUT FEVER  NAUSEA AND VOMITING THAT IS NOT CONTROLLED WITH YOUR NAUSEA MEDICATION  *UNUSUAL SHORTNESS OF BREATH  *UNUSUAL BRUISING OR BLEEDING  TENDERNESS IN MOUTH AND THROAT WITH OR WITHOUT PRESENCE OF ULCERS  *URINARY PROBLEMS  *BOWEL PROBLEMS  UNUSUAL RASH Items with * indicate a potential emergency and should be followed up as soon as possible.  Feel free to call the clinic you have any questions or concerns. The clinic phone number is [phone].  Please show the [facility] at check-in to the Emergency Department and triage nurse.  Hypokalemia Hypokalemia means that the amount of potassium in the blood is lower than normal.Potassium is a chemical, called an electrolyte, that helps regulate the amount of fluid in the body. It also stimulates muscle contraction and helps nerves function properly.Most of the body's potassium is inside of cells, and only a very small amount is in the blood. Because the amount in the blood is so small, minor changes can be life-threatening. CAUSES Antibiotics. Diarrhea or vomiting. Using laxatives too much, which can cause diarrhea. Chronic kidney disease. Water pills (diuretics). Eating disorders (bulimia). Low magnesium level. Sweating a lot. SIGNS AND SYMPTOMS Weakness. Constipation. Fatigue. Muscle cramps. Mental confusion. Skipped heartbeats or irregular heartbeat (palpitations). Tingling or numbness. DIAGNOSIS  Your health care provider  can diagnose hypokalemia with blood tests. In addition to checking your potassium level, your health care provider may also check other lab tests. TREATMENT Hypokalemia can be treated with potassium supplements taken by mouth or adjustments in your current medicines. If your potassium level is very low, you may need to get potassium through a vein (IV) and be monitored in the hospital. A diet high in potassium is also helpful. Foods high in potassium are: Nuts, such as peanuts and pistachios. Seeds, such as sunflower seeds and pumpkin seeds. Peas, lentils, and lima beans. Whole grain and bran cereals and breads. Fresh fruit and vegetables, such as apricots, avocado, bananas, cantaloupe, kiwi, oranges, tomatoes, asparagus, and potatoes. Orange and tomato juices. Red meats. Fruit yogurt. HOME CARE INSTRUCTIONS Take all medicines as prescribed by your health care provider. Maintain a healthy diet by including nutritious food, such as fruits, vegetables, nuts, whole grains, and lean meats. If you are taking a laxative, be sure to follow the directions on the label. SEEK MEDICAL CARE IF: Your weakness gets worse. You feel your heart pounding or racing. You are vomiting or having diarrhea. You are diabetic and having trouble keeping your blood glucose in the normal range. SEEK IMMEDIATE MEDICAL CARE IF: You have chest pain, shortness of breath, or dizziness. You are vomiting or having diarrhea for more than 2 days. You faint. MAKE SURE YOU:  Understand these instructions. Will watch your condition. Will get help right away if you are not doing well or get worse.   This information is not intended to replace advice given to you by your health care provider. Make sure you discuss any questions you have with your health care provider.   Document Released: 02/23/2005 Document  Revised: 03/16/2014 Document Reviewed: 08/26/2012 Elsevier Interactive Patient Education 2016 [place]  Per Val  (Dr. Magrinat's nurse), begin taking potassium chloride tablets as follows:   * Take potassium chloride 20 mEq (2 tablets of the 10 mEq prescription you have) twice daily for 3 days.  Take first dose tonight.    * Then take potassium chloride 20 mEq daily.

## 2015-10-17 NOTE — Progress Notes (Signed)
[name]  Telephone:(336) [phone] Fax:(336) [phone]     ID: Kristine Parrish DOB: 07/22/1945  MR#: [id number]  DGU#:[id number]  Patient Care Team: Kristine Melter, MD as PCP - General (Family Medicine) Kristine Messing III, MD as Consulting Physician (General Surgery) Kristine Cruel, MD as Consulting Physician (Oncology) Kristine Gibson, MD as Attending Physician (Radiation Oncology) Kristine Donning, MD (Dermatology) Kristine Spates, MD as Consulting Physician (Gastroenterology) Kristine Parrish, DPM as Consulting Physician (Podiatry) Kristine Gilmore Evette Cristal, MD as Consulting Physician (Obstetrics and Gynecology) Kristine Dresser, MD as Consulting Physician (Cardiology) Kristine Norway, RN as Registered Nurse PCP: Kristine Melter, MD OTHER MD:  CHIEF COMPLAINT: Estrogen receptor positive breast cancer  CURRENT TREATMENT: Neoadjuvant chemotherapy and anti-HER-2 immunotherapy   BREAST CANCER HISTORY: From the original intake note  Kristine Parrish herself noted a change in her left breast and brought it to the attention of Dr. Nori Parrish, who set her up for bilateral diagnostic mammography with tomography and left breast ultrasonography at the [facility] 08/09/2015. The breast density was category D. In the area of palpable concern there was a mass with irregular margins and architectural distortion, measuring approximately 3 cm. This was palpable in the upper outer quadrant near the nipple. The ultrasound confirmed an irregular hypoechoic mass approximately 3 cm from the nipple at the 10:00 position measuring 2.7 cm. Ultrasound of the left axilla found a single level I left axillary node with focal cortical thickening.  On 08/13/2015 the patient underwent biopsy of the left breast mass and the suspicious left axillary lymph node, both showing invasive ductal carcinoma, grade 2 or 3, estrogen receptor 100% positive, progesterone receptor 100% positive, both with strong staining intensity, with an MIB-1 of  50%, and HER-2 amplification, the signals ratio being 2.2 to and the number per cell 3.55  The patient's subsequent history is as detailed below  INTERVAL HISTORY: Kristine Parrish returns today for follow-up of her triple positive breast cancer. Today is day 1 cycle 3 of  Abraxane, which she receives weekly, together with trastuzumab, which she receives every 3 weeks. She earlier received carboplatin, docetaxel, trastuzumab and pertuzumab but after 1 cycle she was not able to tolerated. She also does not tolerate the pertuzumab which caused her severe diarrhea  REVIEW OF SYSTEMS: Kristine Parrish had diarrhea "a half a day", namely one morning where she had about 10 loose bowel movements. She took a total of 4 Imodium and that cleared. She had occasional nausea, but no vomiting. She took the nausea medicine for 2 days as prescribed. She does have numbness in her feet but that is chronic, preceded treatment, and has not changed. For the ankle swelling she tried some compression stockings. That made the ankles feel better but it didn't help the numbness of course.  her port is working well. A detailed review of systems today was otherwise stable.   PAST MEDICAL HISTORY: Past Medical History:  Diagnosis Date  . Breast cancer of upper-outer quadrant of left female breast ([place]) 08/15/2015  . Colon polyps   . GERD (gastroesophageal reflux disease)   . Hiatal hernia   . History of bronchitis   . Hypertension   . Peptic ulcer disease   . Peripheral neuropathy ([place])   . Type 2 diabetes mellitus ([place])     PAST SURGICAL HISTORY: Past Surgical History:  Procedure Laterality Date  . APPENDECTOMY  1974  . cataract surgery  04/2012  . CHOLECYSTECTOMY  1996  . COLONOSCOPY    . FOOT SURGERY  left bunion removed  . nerve removed from left foot  2000  . PORTACATH PLACEMENT N/A 09/05/2015   Procedure: INSERTION PORT-A-CATH;  Surgeon: Kristine Messing III, MD;  Location: [facility];  Service: General;  Laterality: N/A;  . TUBAL  LIGATION    . VAGINAL HYSTERECTOMY  1995    FAMILY HISTORY Family History  Problem Relation Age of Onset  . Diabetes Mother   . Hypertension Mother   . Alcoholism Maternal Grandfather   . Congestive Heart Failure Maternal Grandmother   The patient has little information about her father and is not sure of his cause of death or age at death. The patient's mother died at age 64. The patient had no siblings. She was raised by her grandmother. She is not aware of any breast or ovarian cancer history in the family   GYNECOLOGIC HISTORY:  No LMP recorded. Patient has had a hysterectomy.  menarche age 89, first live birth age 36. The patient is GX P2. She underwent hysterectomy with bilateral salpingo-oophorectomy in 1994, and has been on estrogen replacement since that time, discontinued June 2017.   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 Daughter Kristine Parrish lives in [place] and works as a Teacher, music for the  [employer]. Son Kristine Parrish lives in [place] where he is Secondary school teacher. The patient had one grandchild. She attends [place] [employer]. The patient's daughter Kristine Parrish is her healthcare power of attorney.     HEALTH MAINTENANCE: Social History  Substance Use Topics  . Smoking status: Never Smoker  . Smokeless tobacco: Never Used  . Alcohol use No     Colonoscopy: 2012/[facility]  PAP:  Bone density: 2015/"normal" at Dr. Verlon Au  Lipid panel:  No Known Allergies  Current Outpatient Prescriptions  Medication Sig Dispense Refill  . aspirin 81 MG tablet Take 81 mg by mouth daily.    Marland Kitchen gabapentin (NEURONTIN) 600 MG tablet TAKE ONE TABLET BY MOUTH THREE TIMES DAILY  90 tablet 5  . hydrochlorothiazide (MICROZIDE) 12.5 MG capsule Take 12.5 mg by mouth daily.    Marland Kitchen HYDROcodone-acetaminophen (NORCO) 5-325 MG tablet Take 1-2  tablets by mouth every 6 (six) hours as needed. 30 tablet 0  . LORazepam (ATIVAN) 0.5 MG tablet Take 1 tablet (0.5 mg total) by mouth at bedtime as needed for anxiety. 30 tablet 0  . lovastatin (MEVACOR) 20 MG tablet Take 20 mg by mouth at bedtime.    . Multiple Vitamin (MULTIVITAMIN) tablet Take 1 tablet by mouth daily.    Marland Kitchen omeprazole (PRILOSEC) 20 MG capsule Take 20 mg by mouth daily.    . potassium chloride (MICRO-K) 10 MEQ CR capsule Take 2 capsules (20 mEq total) by mouth daily. 60 capsule 0  . Probiotic Product (PROBIOTIC DAILY PO) Take by mouth. Reported on 08/21/2015     No current facility-administered medications for this visit.     OBJECTIVE: Middle-aged white woman who appears stated age  2:   10/17/15 1353  BP: (!) 147/80  Pulse: 95  Resp: 18  Temp: 98 F (36.7 C)     Body mass index is 28.01 kg/m.    ECOG FS:1 - Symptomatic but completely ambulatory  Sclerae unicteric, EOMs intact Oropharynx clear and moist No cervical or supraclavicular adenopathy Lungs no rales or rhonchi Heart regular rate and rhythm Abd soft, nontender, positive bowel sounds  MSK no focal spinal tenderness, no upper extremity lymphedema Neuro: nonfocal, well oriented, appropriate affect Breasts: Deferred     LAB RESULTS:  CMP     Component Value Date/Time   NA 138 10/10/2015 1413   K 3.7 10/10/2015 1413   CL 102 09/03/2015 1542   CO2 26 10/10/2015 1413   GLUCOSE 220 (H) 10/10/2015 1413   BUN 9.1 10/10/2015 1413   CREATININE 0.8 10/10/2015 1413   CALCIUM 9.3 10/10/2015 1413   PROT 6.7 10/10/2015 1413   ALBUMIN 3.4 (L) 10/10/2015 1413   AST 24 10/10/2015 1413   ALT 27 10/10/2015 1413   ALKPHOS 136 10/10/2015 1413   BILITOT 0.34 10/10/2015 1413   GFRNONAA >60 09/03/2015 1542   GFRAA >60 09/03/2015 1542    INo results found for: SPEP, UPEP  Lab Results  Component Value Date   WBC 5.2 10/17/2015   NEUTROABS 2.7 10/17/2015   HGB 10.6 (L) 10/17/2015   HCT 32.5 (L)  10/17/2015   MCV 86.0 10/17/2015   PLT 185 10/17/2015      Chemistry      Component Value Date/Time   NA 138 10/10/2015 1413   K 3.7 10/10/2015 1413   CL 102 09/03/2015 1542   CO2 26 10/10/2015 1413   BUN 9.1 10/10/2015 1413   CREATININE 0.8 10/10/2015 1413      Component Value Date/Time   CALCIUM 9.3 10/10/2015 1413   ALKPHOS 136 10/10/2015 1413   AST 24 10/10/2015 1413   ALT 27 10/10/2015 1413   BILITOT 0.34 10/10/2015 1413       No results found for: LABCA2  No components found for: LABCA125  No results for input(s): INR in the last 168 hours.  Urinalysis No results found for: COLORURINE, APPEARANCEUR, LABSPEC, PHURINE, GLUCOSEU, HGBUR, BILIRUBINUR, KETONESUR, PROTEINUR, UROBILINOGEN, NITRITE, LEUKOCYTESUR  STUDIES: No results found.  ELIGIBLE FOR AVAILABLE RESEARCH PROTOCOL: PALLAS, ALLIANCE  ASSESSMENT: 70 y.o.  woman status Parrish left breast upper outer quadrant and left axillary lymph node biopsy 08/13/2015 both positive for an invasive ductal carcinoma, grade 2 or 3, triple positive, with an MIB-1 of 50%  (1) neoadjuvant chemotherapy consisting of carboplatin, docetaxel, trastuzumab and pertuzumab given every 21 days Started 09/11/2015  (a) treatment changed to Abraxane and trastuzumab with cycle 2 because of significant problems after cycle 1  (2) trastuzumab will be continued to complete a year  (a) most recent echocardiogram 09/09/2015 showed an ejection fraction of 60-65%   (3) definitive surgery to follow with TAD and consideration of the Alliance trial  (4) adjuvant radiation to follow as appropriate  (5) anti-estrogens to follow, with consideration of the PALLAS trial  PLAN: Yaeko isTolerating her current treatment well enough that I think we can change the visits from weekly to every other week. The big concern of course is peripheral neuropathy and in her case is his difficult to ascertain because she has baseline neuropathy involving  both feet.  She is very aware of this. She is can let us know if there is absolutely any difference. For financial relations she is very much in favor of every other week visits.   Accordingly she will not see me next week. She will have Abraxane and trastuzumab that week. She will see me 2 weeks from now. We will continue to check labs of course on a weekly basis and she knows to call for any problems that may develop before the next visit.  Kristine Cruel, MD   10/17/2015 2:18 PM Medical  Oncology and Hematology [place]"

## 2015-10-21 ENCOUNTER — Other Ambulatory Visit: Payer: BLUE CROSS/BLUE SHIELD

## 2015-10-21 ENCOUNTER — Ambulatory Visit: Payer: BLUE CROSS/BLUE SHIELD

## 2015-10-23 ENCOUNTER — Other Ambulatory Visit: Payer: Self-pay | Admitting: *Deleted

## 2015-10-23 MED ORDER — POTASSIUM CHLORIDE ER 10 MEQ PO CPCR: 20.0000 meq | ORAL_CAPSULE | Freq: Every day | ORAL | 0 refills | Status: DC

## 2015-10-24 ENCOUNTER — Ambulatory Visit (HOSPITAL_BASED_OUTPATIENT_CLINIC_OR_DEPARTMENT_OTHER): Payer: BLUE CROSS/BLUE SHIELD

## 2015-10-24 ENCOUNTER — Other Ambulatory Visit (HOSPITAL_BASED_OUTPATIENT_CLINIC_OR_DEPARTMENT_OTHER): Payer: BLUE CROSS/BLUE SHIELD

## 2015-10-24 VITALS — BP 147/76 | HR 97 | Temp 98.1°F | Resp 20

## 2015-10-24 DIAGNOSIS — C50412 Malignant neoplasm of upper-outer quadrant of left female breast: Secondary | ICD-10-CM

## 2015-10-24 DIAGNOSIS — Z5112 Encounter for antineoplastic immunotherapy: Secondary | ICD-10-CM | POA: Diagnosis not present

## 2015-10-24 DIAGNOSIS — C773 Secondary and unspecified malignant neoplasm of axilla and upper limb lymph nodes: Secondary | ICD-10-CM | POA: Diagnosis not present

## 2015-10-24 LAB — COMPREHENSIVE METABOLIC PANEL
ALT: 23 U/L (ref 0–55)
AST: 22 U/L (ref 5–34)
Albumin: 3.3 g/dL — ABNORMAL LOW (ref 3.5–5.0)
Alkaline Phosphatase: 124 U/L (ref 40–150)
Anion Gap: 10 mEq/L (ref 3–11)
BUN: 8.8 mg/dL (ref 7.0–26.0)
CO2: 27 mEq/L (ref 22–29)
Calcium: 9 mg/dL (ref 8.4–10.4)
Chloride: 100 mEq/L (ref 98–109)
Creatinine: 0.8 mg/dL (ref 0.6–1.1)
EGFR: 74 mL/min/{1.73_m2} — ABNORMAL LOW (ref >90)
Glucose: 185 mg/dl — ABNORMAL HIGH (ref 70–140)
Potassium: 3.5 mEq/L (ref 3.5–5.1)
Sodium: 137 mEq/L (ref 136–145)
Total Bilirubin: 0.6 mg/dL (ref 0.20–1.20)
Total Protein: 6.7 g/dL (ref 6.4–8.3)

## 2015-10-24 LAB — CBC WITH DIFFERENTIAL/PLATELET
BASO%: 1 % (ref 0.0–2.0)
Basophils Absolute: 0.1 10*3/uL (ref 0.0–0.1)
EOS%: 2.1 % (ref 0.0–7.0)
Eosinophils Absolute: 0.1 10*3/uL (ref 0.0–0.5)
HCT: 32 % — ABNORMAL LOW (ref 34.8–46.6)
HGB: 10.4 g/dL — ABNORMAL LOW (ref 11.6–15.9)
LYMPH%: 30.6 % (ref 14.0–49.7)
MCH: 27.8 pg (ref 25.1–34.0)
MCHC: 32.5 g/dL (ref 31.5–36.0)
MCV: 85.7 fL (ref 79.5–101.0)
MONO#: 0.3 10*3/uL (ref 0.1–0.9)
MONO%: 4.9 % (ref 0.0–14.0)
NEUT#: 3.3 10*3/uL (ref 1.5–6.5)
NEUT%: 61.4 % (ref 38.4–76.8)
Platelets: 291 10*3/uL (ref 145–400)
RBC: 3.73 10*6/uL (ref 3.70–5.45)
RDW: 15.6 % — ABNORMAL HIGH (ref 11.2–14.5)
WBC: 5.4 10*3/uL (ref 3.9–10.3)
lymph#: 1.7 10*3/uL (ref 0.9–3.3)

## 2015-10-24 MED ORDER — SODIUM CHLORIDE 0.9 % IV SOLN: 420.0000 mg | Freq: Once | INTRAVENOUS | Status: DC

## 2015-10-24 MED ORDER — DIPHENHYDRAMINE HCL 25 MG PO CAPS
ORAL_CAPSULE | ORAL | Status: AC
  Filled 2015-10-24: qty 1

## 2015-10-24 MED ORDER — SODIUM CHLORIDE 0.9 % IV SOLN
Freq: Once | INTRAVENOUS | Status: AC
  Administered 2015-10-24: 10:00:00 via INTRAVENOUS

## 2015-10-24 MED ORDER — TRASTUZUMAB CHEMO 150 MG IV SOLR
450.0000 mg | Freq: Once | INTRAVENOUS | Status: AC
  Administered 2015-10-24: 450 mg via INTRAVENOUS
  Filled 2015-10-24: qty 21.43

## 2015-10-24 MED ORDER — ACETAMINOPHEN 325 MG PO TABS
ORAL_TABLET | ORAL | Status: AC
  Filled 2015-10-24: qty 2

## 2015-10-24 MED ORDER — SODIUM CHLORIDE 0.9% FLUSH
10.0000 mL | INTRAVENOUS | Status: DC | PRN
  Administered 2015-10-24: 10 mL
  Filled 2015-10-24: qty 10

## 2015-10-24 MED ORDER — DIPHENHYDRAMINE HCL 25 MG PO CAPS
25.0000 mg | ORAL_CAPSULE | Freq: Once | ORAL | Status: AC
  Administered 2015-10-24: 25 mg via ORAL

## 2015-10-24 MED ORDER — ACETAMINOPHEN 325 MG PO TABS
650.0000 mg | ORAL_TABLET | Freq: Once | ORAL | Status: AC
  Administered 2015-10-24: 650 mg via ORAL

## 2015-10-24 MED ORDER — HEPARIN SOD (PORK) LOCK FLUSH 100 UNIT/ML IV SOLN
500.0000 [IU] | Freq: Once | INTRAVENOUS | Status: AC | PRN
  Administered 2015-10-24: 500 [IU]
  Filled 2015-10-24: qty 5

## 2015-10-24 NOTE — Progress Notes (Signed)
Due to Diarrhea Perjeta will be held today. Pharmacy aware. Pt aware and verbalizes understanding.

## 2015-10-24 NOTE — Patient Instructions (Signed)
[facility] Discharge Instructions for Patients  Today you received the following: Herceptin   To help prevent nausea and vomiting after your treatment, we encourage you to take your nausea medication as directed.   If you develop nausea and vomiting that is not controlled by your nausea medication, call the clinic.   BELOW ARE SYMPTOMS THAT SHOULD BE REPORTED IMMEDIATELY:  *FEVER GREATER THAN 100.5 F  *CHILLS WITH OR WITHOUT FEVER  NAUSEA AND VOMITING THAT IS NOT CONTROLLED WITH YOUR NAUSEA MEDICATION  *UNUSUAL SHORTNESS OF BREATH  *UNUSUAL BRUISING OR BLEEDING  TENDERNESS IN MOUTH AND THROAT WITH OR WITHOUT PRESENCE OF ULCERS  *URINARY PROBLEMS  *BOWEL PROBLEMS  UNUSUAL RASH Items with * indicate a potential emergency and should be followed up as soon as possible.  Feel free to call the clinic you have any questions or concerns. The clinic phone number is [phone].  Please show the CHEMO ALERT CARD at check-in to the Emergency Department and triage nurse.   

## 2015-10-24 NOTE — Progress Notes (Signed)
Pt reports numbness in her fingertips that started last week, that I snot affecting ADLs at this time. Dr. Jana Hakim aware and orders given to hold Abraxane this treatment ad he will see her in office next week. Pt aware and verbalizes understanding. Due to

## 2015-10-31 ENCOUNTER — Other Ambulatory Visit (HOSPITAL_BASED_OUTPATIENT_CLINIC_OR_DEPARTMENT_OTHER): Payer: BLUE CROSS/BLUE SHIELD

## 2015-10-31 ENCOUNTER — Ambulatory Visit (HOSPITAL_BASED_OUTPATIENT_CLINIC_OR_DEPARTMENT_OTHER): Payer: BLUE CROSS/BLUE SHIELD | Admitting: Oncology

## 2015-10-31 ENCOUNTER — Ambulatory Visit: Payer: BLUE CROSS/BLUE SHIELD

## 2015-10-31 ENCOUNTER — Telehealth: Payer: Self-pay | Admitting: Oncology

## 2015-10-31 VITALS — BP 139/69 | HR 96 | Temp 98.3°F | Resp 18 | Ht 65.0 in | Wt 171.1 lb

## 2015-10-31 DIAGNOSIS — C50412 Malignant neoplasm of upper-outer quadrant of left female breast: Secondary | ICD-10-CM | POA: Diagnosis not present

## 2015-10-31 DIAGNOSIS — C773 Secondary and unspecified malignant neoplasm of axilla and upper limb lymph nodes: Secondary | ICD-10-CM

## 2015-10-31 DIAGNOSIS — Z170 Estrogen receptor positive status [ER+]: Secondary | ICD-10-CM

## 2015-10-31 LAB — CBC WITH DIFFERENTIAL/PLATELET
BASO%: 0.6 % (ref 0.0–2.0)
Basophils Absolute: 0 10*3/uL (ref 0.0–0.1)
EOS%: 2.1 % (ref 0.0–7.0)
Eosinophils Absolute: 0.1 10*3/uL (ref 0.0–0.5)
HCT: 33.4 % — ABNORMAL LOW (ref 34.8–46.6)
HGB: 10.7 g/dL — ABNORMAL LOW (ref 11.6–15.9)
LYMPH%: 25.4 % (ref 14.0–49.7)
MCH: 27.7 pg (ref 25.1–34.0)
MCHC: 32 g/dL (ref 31.5–36.0)
MCV: 86.6 fL (ref 79.5–101.0)
MONO#: 0.6 10*3/uL (ref 0.1–0.9)
MONO%: 8.2 % (ref 0.0–14.0)
NEUT#: 4.5 10*3/uL (ref 1.5–6.5)
NEUT%: 63.7 % (ref 38.4–76.8)
Platelets: 370 10*3/uL (ref 145–400)
RBC: 3.86 10*6/uL (ref 3.70–5.45)
RDW: 17.7 % — ABNORMAL HIGH (ref 11.2–14.5)
WBC: 7 10*3/uL (ref 3.9–10.3)
lymph#: 1.8 10*3/uL (ref 0.9–3.3)

## 2015-10-31 LAB — COMPREHENSIVE METABOLIC PANEL
ALT: 18 U/L (ref 0–55)
AST: 15 U/L (ref 5–34)
Albumin: 3.1 g/dL — ABNORMAL LOW (ref 3.5–5.0)
Alkaline Phosphatase: 138 U/L (ref 40–150)
Anion Gap: 10 mEq/L (ref 3–11)
BUN: 8.8 mg/dL (ref 7.0–26.0)
CO2: 25 mEq/L (ref 22–29)
Calcium: 9.3 mg/dL (ref 8.4–10.4)
Chloride: 104 mEq/L (ref 98–109)
Creatinine: 0.8 mg/dL (ref 0.6–1.1)
EGFR: 74 mL/min/{1.73_m2} — ABNORMAL LOW (ref >90)
Glucose: 202 mg/dl — ABNORMAL HIGH (ref 70–140)
Potassium: 3.9 mEq/L (ref 3.5–5.1)
Sodium: 139 mEq/L (ref 136–145)
Total Bilirubin: 0.37 mg/dL (ref 0.20–1.20)
Total Protein: 6.5 g/dL (ref 6.4–8.3)

## 2015-10-31 NOTE — Progress Notes (Signed)
[facility]  Telephone:([phone] Fax:(336) [phone]     ID: Kristine Parrish DOB: [birth year]  MR#: [id number]  PNT#:[id number]  Patient Care Team: Orpah Melter, MD as PCP - General (Family Medicine) Autumn Messing III, MD as Consulting Physician (General Surgery) Chauncey Cruel, MD as Consulting Physician (Oncology) Eppie Gibson, MD as Attending Physician (Radiation Oncology) Murvin Donning, MD (Dermatology) Laurence Spates, MD as Consulting Physician (Gastroenterology) Rosemary Holms, DPM as Consulting Physician (Podiatry) Viona Gilmore Evette Cristal, MD as Consulting Physician (Obstetrics and Gynecology) Larey Dresser, MD as Consulting Physician (Cardiology) Benson Norway, RN as Registered Nurse PCP: Orpah Melter, MD OTHER MD:  CHIEF COMPLAINT: Estrogen receptor positive breast cancer  CURRENT TREATMENT: Neoadjuvant chemotherapy and anti-HER-2 immunotherapy   BREAST CANCER HISTORY: From the original intake note  Jola herself noted a change in her left breast and brought it to the attention of Dr. Nori Riis, who set her up for bilateral diagnostic mammography with tomography and left breast ultrasonography at the [facility] 08/09/2015. The breast density was category D. In the area of palpable concern there was a mass with irregular margins and architectural distortion, measuring approximately 3 cm. This was palpable in the upper outer quadrant near the nipple. The ultrasound confirmed an irregular hypoechoic mass approximately 3 cm from the nipple at the 10:00 position measuring 2.7 cm. Ultrasound of the left axilla found a single level I left axillary node with focal cortical thickening.  On 08/13/2015 the patient underwent biopsy of the left breast mass and the suspicious left axillary lymph node, both showing invasive ductal carcinoma, grade 2 or 3, estrogen receptor 100% positive, progesterone receptor 100% positive, both with strong staining intensity, with an MIB-1 of  50%, and HER-2 amplification, the signals ratio being 2.2 to and the number per cell 3.55  The patient's subsequent history is as detailed below  INTERVAL HISTORY: Kristine Parrish returns today for follow-up of her triple positive breast cancer accompanied by her son-in-law.. She was supposed to have received her fourth dose of Abraxane last week, when she received her Herceptin dose, but she complained of worsening peripheral neuropathy in her hands and the Abraxane was stopped. We did proceed with Herceptin as scheduled on that date.  REVIEW OF SYSTEMS: [name] has baseline neuropathy in her feet, and this has not changed. She had essentially no neuropathy in her hands though and now she dies. It is still mild but it is persistent. I don't think we're going to be able to review her anymore taxanes. Aside from this issue, she is doing "well", working, exercising a little, and generally trying to keep as normal a functional status as possible. A detailed review of systems today was otherwise noncontributory   PAST MEDICAL HISTORY: Past Medical History:  Diagnosis Date  . Breast cancer of upper-outer quadrant of left female breast ([name]) 08/15/2015  . Colon polyps   . GERD (gastroesophageal reflux disease)   . Hiatal hernia   . History of bronchitis   . Hypertension   . Peptic ulcer disease   . Peripheral neuropathy ([place])   . Type 2 diabetes mellitus ([place])     PAST SURGICAL HISTORY: Past Surgical History:  Procedure Laterality Date  . APPENDECTOMY  1974  . cataract surgery  04/2012  . CHOLECYSTECTOMY  1996  . COLONOSCOPY    . FOOT SURGERY     left bunion removed  . nerve removed from left foot  2000  . PORTACATH PLACEMENT N/A 09/05/2015   Procedure:  INSERTION PORT-A-CATH;  Surgeon: Autumn Messing III, MD;  Location: [facility];  Service: General;  Laterality: N/A;  . TUBAL LIGATION    . VAGINAL HYSTERECTOMY  1995    FAMILY HISTORY Family History  Problem Relation Age of Onset  . Diabetes Mother     . Hypertension Mother   . Alcoholism Maternal Grandfather   . Congestive Heart Failure Maternal Grandmother   The patient has little information about her father and is not sure of his cause of death or age at death. The patient's mother died at age 16. The patient had no siblings. She was raised by her grandmother. She is not aware of any breast or ovarian cancer history in the family   GYNECOLOGIC HISTORY:  No LMP recorded. Patient has had a hysterectomy.  menarche age 37, first live birth age 20. The patient is GX P2. She underwent hysterectomy with bilateral salpingo-oophorectomy in 1994, and has been on estrogen replacement since that time, discontinued June 2017.   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 Daughter Kristine Parrish lives in [place] and works as a Teacher, music for the  [employer]. Son Kristine Parrish lives in [place] where he is Secondary school teacher. The patient had one grandchild. She attends [place] [employer]. The patient's daughter [name] is her healthcare power of attorney.     HEALTH MAINTENANCE: Social History  Substance Use Topics  . Smoking status: Never Smoker  . Smokeless tobacco: Never Used  . Alcohol use No     Colonoscopy: 2012/[facility]  PAP:  Bone density: 2015/"normal" at Dr. Verlon Au  Lipid panel:  No Known Allergies  Current Outpatient Prescriptions  Medication Sig Dispense Refill  . aspirin 81 MG tablet Take 81 mg by mouth daily.    Marland Kitchen gabapentin (NEURONTIN) 600 MG tablet TAKE ONE TABLET BY MOUTH THREE TIMES DAILY  90 tablet 5  . hydrochlorothiazide (MICROZIDE) 12.5 MG capsule Take 12.5 mg by mouth daily.    Marland Kitchen HYDROcodone-acetaminophen (NORCO) 5-325 MG tablet Take 1-2 tablets by mouth every 6 (six) hours as needed. 30 tablet 0  . LORazepam (ATIVAN) 0.5 MG tablet Take 1 tablet (0.5 mg total) by  mouth at bedtime as needed for anxiety. 30 tablet 0  . lovastatin (MEVACOR) 20 MG tablet Take 20 mg by mouth at bedtime.    . Multiple Vitamin (MULTIVITAMIN) tablet Take 1 tablet by mouth daily.    Marland Kitchen omeprazole (PRILOSEC) 20 MG capsule Take 20 mg by mouth daily.    . potassium chloride (MICRO-K) 10 MEQ CR capsule Take 2 capsules (20 mEq total) by mouth daily. 60 capsule 0  . Probiotic Product (PROBIOTIC DAILY PO) Take by mouth. Reported on 08/21/2015     No current facility-administered medications for this visit.     OBJECTIVE: Middle-aged white woman In no acute distress  Vitals:   10/31/15 1014  BP: 139/69  Pulse: 96  Resp: 18  Temp: 98.3 F (36.8 C)     Body mass index is 28.47 kg/m.    ECOG FS:1 - Symptomatic but completely ambulatory  Sclerae unicteric, pupils round and equal Oropharynx clear and moist-- no thrush or other lesions No cervical or supraclavicular adenopathy Lungs no rales or rhonchi Heart regular rate and rhythm Abd soft, nontender, positive bowel sounds MSK no focal spinal tenderness, no upper extremity lymphedema Neuro: nonfocal, well oriented, appropriate  affect Breasts: Deferred  LAB RESULTS:  CMP     Component Value Date/Time   NA 139 10/31/2015 1002   K 3.9 10/31/2015 1002   CL 102 09/03/2015 1542   CO2 25 10/31/2015 1002   GLUCOSE 202 (H) 10/31/2015 1002   BUN 8.8 10/31/2015 1002   CREATININE 0.8 10/31/2015 1002   CALCIUM 9.3 10/31/2015 1002   PROT 6.5 10/31/2015 1002   ALBUMIN 3.1 (L) 10/31/2015 1002   AST 15 10/31/2015 1002   ALT 18 10/31/2015 1002   ALKPHOS 138 10/31/2015 1002   BILITOT 0.37 10/31/2015 1002   GFRNONAA >60 09/03/2015 1542   GFRAA >60 09/03/2015 1542    INo results found for: SPEP, UPEP  Lab Results  Component Value Date   WBC 7.0 10/31/2015   NEUTROABS 4.5 10/31/2015   HGB 10.7 (L) 10/31/2015   HCT 33.4 (L) 10/31/2015   MCV 86.6 10/31/2015   PLT 370 10/31/2015      Chemistry      Component Value  Date/Time   NA 139 10/31/2015 1002   K 3.9 10/31/2015 1002   CL 102 09/03/2015 1542   CO2 25 10/31/2015 1002   BUN 8.8 10/31/2015 1002   CREATININE 0.8 10/31/2015 1002      Component Value Date/Time   CALCIUM 9.3 10/31/2015 1002   ALKPHOS 138 10/31/2015 1002   AST 15 10/31/2015 1002   ALT 18 10/31/2015 1002   BILITOT 0.37 10/31/2015 1002       No results found for: LABCA2  No components found for: ZOXWR604  No results for input(s): INR in the last 168 hours.  Urinalysis No results found for: COLORURINE, APPEARANCEUR, LABSPEC, PHURINE, GLUCOSEU, HGBUR, BILIRUBINUR, KETONESUR, PROTEINUR, UROBILINOGEN, NITRITE, LEUKOCYTESUR  STUDIES: No results found.  ELIGIBLE FOR AVAILABLE RESEARCH PROTOCOL: PALLAS, ALLIANCE  ASSESSMENT: 70 y.o. [place] woman status post left breast upper outer quadrant and left axillary lymph node biopsy 08/13/2015 both positive for an invasive ductal carcinoma, grade 2 or 3, triple positive, with an MIB-1 of 50%  (1) neoadjuvant chemotherapy consisting of carboplatin, docetaxel, trastuzumab and pertuzumab given every 21 days Started 09/11/2015  (a) treatment changed to Abraxane and trastuzumab with cycle 2 because of significant problems after cycle 1  (b) Abraxane discontinued after 3d dose (10/17/2015) because of peripheral neuropathy  (c) cyclophosphamide, methotrexate and fluorouracil (CMF) discharge 11/07/2015, 4 cycles  (2) trastuzumab will be continued to complete a year  (a) most recent echocardiogram 09/09/2015 showed an ejection fraction of 60-65%   (3) definitive surgery to follow with TAD vs. consideration of the Alliance trial  (4) adjuvant radiation to follow as appropriate  (5) anti-estrogens to follow at the completion of local treatment, with consideration of the PALLAS trial  PLAN: Kristine Parrish is very motivated to receive her chemotherapy, but she has not been able to tolerate taxanes or pertuzumab. I cannot go with medications that  could worsen her neuropathy. I do not want to go with anthracyclines since she is already at risk for heart damage from anti-HER-2 immunotherapy.  Given those limitations, we are going to switch to CMF chemotherapy. We discussed the possible toxicities, side effects and complications of these agents, which however are generally well tolerated. She will receive 4 doses and of course continue the trastuzumab as well. [see Tryfonidis et al, Breast Cancer Res Treat 540:981]. Specifically she will receive CMF next week, and trastuzumab 2 weeks from now. I will cut the trastuzumab dose to 4 mg/kg so that with the next cycle of  CMF all the drugs can be given together.  I also think it would be useful to obtain a repeat breast MRI at this point. It will tell us how far we have gone already and how far we need to go. This is being scheduled within the next week.  Hannahgrace has a good understanding of this plan and is very mated motivated to proceed. She knows to call me for any problems that may develop before the next visit.  Chauncey Cruel, MD   10/31/2015 4:43 PM Medical Oncology and Hematology [place]"

## 2015-10-31 NOTE — Telephone Encounter (Signed)
appt made and avs printed °

## 2015-11-07 ENCOUNTER — Ambulatory Visit (HOSPITAL_BASED_OUTPATIENT_CLINIC_OR_DEPARTMENT_OTHER): Payer: BLUE CROSS/BLUE SHIELD | Admitting: Oncology

## 2015-11-07 ENCOUNTER — Ambulatory Visit (HOSPITAL_BASED_OUTPATIENT_CLINIC_OR_DEPARTMENT_OTHER): Payer: BLUE CROSS/BLUE SHIELD

## 2015-11-07 ENCOUNTER — Telehealth: Payer: Self-pay | Admitting: Oncology

## 2015-11-07 ENCOUNTER — Other Ambulatory Visit (HOSPITAL_BASED_OUTPATIENT_CLINIC_OR_DEPARTMENT_OTHER): Payer: BLUE CROSS/BLUE SHIELD

## 2015-11-07 VITALS — BP 147/75 | HR 107 | Temp 97.5°F | Resp 17 | Ht 65.0 in | Wt 169.2 lb

## 2015-11-07 DIAGNOSIS — Z5111 Encounter for antineoplastic chemotherapy: Secondary | ICD-10-CM

## 2015-11-07 DIAGNOSIS — C50412 Malignant neoplasm of upper-outer quadrant of left female breast: Secondary | ICD-10-CM

## 2015-11-07 DIAGNOSIS — Z170 Estrogen receptor positive status [ER+]: Secondary | ICD-10-CM | POA: Diagnosis not present

## 2015-11-07 LAB — CBC WITH DIFFERENTIAL/PLATELET
BASO%: 0.6 % (ref 0.0–2.0)
Basophils Absolute: 0.1 10*3/uL (ref 0.0–0.1)
EOS%: 2.4 % (ref 0.0–7.0)
Eosinophils Absolute: 0.2 10*3/uL (ref 0.0–0.5)
HCT: 34 % — ABNORMAL LOW (ref 34.8–46.6)
HGB: 11.1 g/dL — ABNORMAL LOW (ref 11.6–15.9)
LYMPH%: 21.6 % (ref 14.0–49.7)
MCH: 28.2 pg (ref 25.1–34.0)
MCHC: 32.7 g/dL (ref 31.5–36.0)
MCV: 86.3 fL (ref 79.5–101.0)
MONO#: 0.7 10*3/uL (ref 0.1–0.9)
MONO%: 7 % (ref 0.0–14.0)
NEUT#: 6.4 10*3/uL (ref 1.5–6.5)
NEUT%: 68.4 % (ref 38.4–76.8)
Platelets: 395 10*3/uL (ref 145–400)
RBC: 3.94 10*6/uL (ref 3.70–5.45)
RDW: 17.8 % — ABNORMAL HIGH (ref 11.2–14.5)
WBC: 9.4 10*3/uL (ref 3.9–10.3)
lymph#: 2 10*3/uL (ref 0.9–3.3)

## 2015-11-07 LAB — COMPREHENSIVE METABOLIC PANEL
ALT: 16 U/L (ref 0–55)
AST: 15 U/L (ref 5–34)
Albumin: 3.2 g/dL — ABNORMAL LOW (ref 3.5–5.0)
Alkaline Phosphatase: 152 U/L — ABNORMAL HIGH (ref 40–150)
Anion Gap: 10 mEq/L (ref 3–11)
BUN: 10.4 mg/dL (ref 7.0–26.0)
CO2: 24 mEq/L (ref 22–29)
Calcium: 9.2 mg/dL (ref 8.4–10.4)
Chloride: 105 mEq/L (ref 98–109)
Creatinine: 0.8 mg/dL (ref 0.6–1.1)
EGFR: 70 mL/min/{1.73_m2} — ABNORMAL LOW (ref >90)
Glucose: 204 mg/dl — ABNORMAL HIGH (ref 70–140)
Potassium: 4.2 mEq/L (ref 3.5–5.1)
Sodium: 139 mEq/L (ref 136–145)
Total Bilirubin: <0.3 mg/dL (ref 0.20–1.20)
Total Protein: 6.8 g/dL (ref 6.4–8.3)

## 2015-11-07 MED ORDER — PALONOSETRON HCL INJECTION 0.25 MG/5ML
0.2500 mg | Freq: Once | INTRAVENOUS | Status: AC
  Administered 2015-11-07: 0.25 mg via INTRAVENOUS

## 2015-11-07 MED ORDER — SODIUM CHLORIDE 0.9 % IV SOLN
10.0000 mg | Freq: Once | INTRAVENOUS | Status: AC
  Administered 2015-11-07: 10 mg via INTRAVENOUS
  Filled 2015-11-07: qty 1

## 2015-11-07 MED ORDER — HEPARIN SOD (PORK) LOCK FLUSH 100 UNIT/ML IV SOLN
500.0000 [IU] | Freq: Once | INTRAVENOUS | Status: AC | PRN
  Administered 2015-11-07: 500 [IU]
  Filled 2015-11-07: qty 5

## 2015-11-07 MED ORDER — SODIUM CHLORIDE 0.9 % IV SOLN
600.0000 mg/m2 | Freq: Once | INTRAVENOUS | Status: AC
  Administered 2015-11-07: 1140 mg via INTRAVENOUS
  Filled 2015-11-07: qty 57

## 2015-11-07 MED ORDER — SODIUM CHLORIDE 0.9% FLUSH
10.0000 mL | INTRAVENOUS | Status: DC | PRN
  Administered 2015-11-07: 10 mL
  Filled 2015-11-07: qty 10

## 2015-11-07 MED ORDER — METHOTREXATE SODIUM (PF) CHEMO INJECTION 250 MG/10ML
39.6500 mg/m2 | Freq: Once | INTRAMUSCULAR | Status: AC
  Administered 2015-11-07: 75 mg via INTRAVENOUS
  Filled 2015-11-07: qty 3

## 2015-11-07 MED ORDER — SODIUM CHLORIDE 0.9 % IV SOLN
Freq: Once | INTRAVENOUS | Status: AC
  Administered 2015-11-07: 10:00:00 via INTRAVENOUS

## 2015-11-07 MED ORDER — PALONOSETRON HCL INJECTION 0.25 MG/5ML
INTRAVENOUS | Status: AC
  Filled 2015-11-07: qty 5

## 2015-11-07 MED ORDER — FLUOROURACIL CHEMO INJECTION 2.5 GM/50ML
600.0000 mg/m2 | Freq: Once | INTRAVENOUS | Status: AC
  Administered 2015-11-07: 1150 mg via INTRAVENOUS
  Filled 2015-11-07: qty 23

## 2015-11-07 NOTE — Progress Notes (Signed)
Kristine Parrish  [phone] [phone]     ID: Kristine Parrish DOB: Jul 12, 1945  MR#: [id number]  BOM#:[id number]  Patient Care Team: Orpah Melter, MD as PCP - General (Family Medicine) Autumn Messing III, MD as Consulting Physician (General Surgery) Chauncey Cruel, MD as Consulting Physician (Oncology) Eppie Gibson, MD as Attending Physician (Radiation Oncology) Murvin Donning, MD (Dermatology) Laurence Spates, MD as Consulting Physician (Gastroenterology) Rosemary Holms, DPM as Consulting Physician (Podiatry) Viona Gilmore Evette Cristal, MD as Consulting Physician (Obstetrics and Gynecology) Larey Dresser, MD as Consulting Physician (Cardiology) Benson Norway, RN as Registered Nurse PCP: Orpah Melter, MD OTHER MD:  CHIEF COMPLAINT: Estrogen receptor positive breast cancer  CURRENT TREATMENT: Neoadjuvant chemotherapy and anti-HER-2 immunotherapy   BREAST CANCER HISTORY: From the original intake note  Kristine Parrish herself noted a change in her left breast and brought it to the attention of Dr. Nori Riis, who set her up for bilateral diagnostic mammography with tomography and left breast ultrasonography at the [facility] 08/09/2015. The breast density was category D. In the area of palpable concern there was a mass with irregular margins and architectural distortion, measuring approximately 3 cm. This was palpable in the upper outer quadrant near the nipple. The ultrasound confirmed an irregular hypoechoic mass approximately 3 cm from the nipple at the 10:00 position measuring 2.7 cm. Ultrasound of the left axilla found a single level I left axillary node with focal cortical thickening.  On 08/13/2015 the patient underwent biopsy of the left breast mass and the suspicious left axillary lymph node, both showing invasive ductal carcinoma, grade 2 or 3, estrogen receptor 100% positive, progesterone receptor 100% positive, both with strong staining intensity, with an MIB-1 of  50%, and HER-2 amplification, the signals ratio being 2.2 to and the number per cell 3.55  The patient's subsequent history is as detailed below  INTERVAL HISTORY: Kristine Parrish returns today for follow-up of her HER-2/neu positive breast cancer accompanied by a friend.. We had to stop her [place], docetaxel and per Jed it because of intolerance, and then we had to stop the Taxol because of peripheral neuropathy. She is now being treated with CMF and Herceptin. Today is day 1 cycle 1 of cyclophosphamide, fluorouracil and methotrexate which she will receive every 21 days for 4 cycles   REVIEW OF SYSTEMS: Kristine Parrish is walking a bit more. She says her energy is good. She is less constipated. Her numbness is still present peripherally but it is improved. She has a little bit of ankle swelling which is stable. She tells me her blood sugars are under good control. A detailed review of systems today was otherwise stable   PAST MEDICAL HISTORY: Past Medical History:  Diagnosis Date  . Breast cancer of upper-outer quadrant of left female breast ([place]) 08/15/2015  . Colon polyps   . GERD (gastroesophageal reflux disease)   . Hiatal hernia   . History of bronchitis   . Hypertension   . Peptic ulcer disease   . Peripheral neuropathy ([place])   . Type 2 diabetes mellitus ([place])     PAST SURGICAL HISTORY: Past Surgical History:  Procedure Laterality Date  . APPENDECTOMY  1974  . cataract surgery  04/2012  . CHOLECYSTECTOMY  1996  . COLONOSCOPY    . FOOT SURGERY     left bunion removed  . nerve removed from left foot  2000  . PORTACATH PLACEMENT N/A 09/05/2015   Procedure: INSERTION PORT-A-CATH;  Surgeon: Autumn Messing III, MD;  Location:  [facility] OR;  Service: General;  Laterality: N/A;  . TUBAL LIGATION    . VAGINAL HYSTERECTOMY  1995    FAMILY HISTORY Family History  Problem Relation Age of Onset  . Diabetes Mother   . Hypertension Mother   . Alcoholism Maternal Grandfather   . Congestive Heart Failure Maternal  Grandmother   The patient has little information about her father and is not sure of his cause of death or age at death. The patient's mother died at age 67. The patient had no siblings. She was raised by her grandmother. She is not aware of any breast or ovarian cancer history in the family   GYNECOLOGIC HISTORY:  No LMP recorded. Patient has had a hysterectomy.  menarche age 31, first live birth age 74. The patient is GX P2. She underwent hysterectomy with bilateral salpingo-oophorectomy in 1994, and has been on estrogen replacement since that time, discontinued June 2017.   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 Daughter Kristine Parrish lives in [place] and works as a Teacher, music for the  [employer]. Son Kristine Parrish lives in [place] where he is Secondary school teacher. The patient had one grandchild. She attends [employer]. The patient's daughter Kristine Parrish is her healthcare power of attorney.     HEALTH MAINTENANCE: Social History  Substance Use Topics  . Smoking status: Never Smoker  . Smokeless tobacco: Never Used  . Alcohol use No     Colonoscopy: 2012/[facility]  PAP:  Bone density: 2015/"normal" at Dr. Verlon Au  Lipid panel:  No Known Allergies  Current Outpatient Prescriptions  Medication Sig Dispense Refill  . aspirin 81 MG tablet Take 81 mg by mouth daily.    Marland Kitchen gabapentin (NEURONTIN) 600 MG tablet TAKE ONE TABLET BY MOUTH THREE TIMES DAILY  90 tablet 5  . hydrochlorothiazide (MICROZIDE) 12.5 MG capsule Take 12.5 mg by mouth daily.    Marland Kitchen HYDROcodone-acetaminophen (NORCO) 5-325 MG tablet Take 1-2 tablets by mouth every 6 (six) hours as needed. 30 tablet 0  . LORazepam (ATIVAN) 0.5 MG tablet Take 1 tablet (0.5 mg total) by mouth at bedtime as needed for anxiety. 30 tablet 0  . lovastatin (MEVACOR) 20 MG tablet Take 20 mg  by mouth at bedtime.    . Multiple Vitamin (MULTIVITAMIN) tablet Take 1 tablet by mouth daily.    Marland Kitchen omeprazole (PRILOSEC) 20 MG capsule Take 20 mg by mouth daily.    . potassium chloride (MICRO-K) 10 MEQ CR capsule Take 2 capsules (20 mEq total) by mouth daily. 60 capsule 0  . Probiotic Product (PROBIOTIC DAILY PO) Take by mouth. Reported on 08/21/2015     No current facility-administered medications for this visit.     OBJECTIVE: Middle-aged white womanWho appears stated age  57:   11/07/15 0927  BP: (!) 147/75  Pulse: (!) 107  Resp: 17  Temp: 97.5 F (36.4 C)     Body mass index is 28.16 kg/m.    ECOG FS:1 - Symptomatic but completely ambulatory  Sclerae unicteric, EOMs intact Oropharynx clear and moist No cervical or supraclavicular adenopathy Lungs no rales or rhonchi Heart regular rate and rhythm Abd soft, nontender, positive bowel sounds MSK no focal spinal tenderness, no upper extremity lymphedema Neuro: nonfocal, well oriented, appropriate affect Breasts: Deferred  LAB RESULTS:  CMP     Component Value Date/Time  NA 139 10/31/2015 1002   K 3.9 10/31/2015 1002   CL 102 09/03/2015 1542   CO2 25 10/31/2015 1002   GLUCOSE 202 (H) 10/31/2015 1002   BUN 8.8 10/31/2015 1002   CREATININE 0.8 10/31/2015 1002   CALCIUM 9.3 10/31/2015 1002   PROT 6.5 10/31/2015 1002   ALBUMIN 3.1 (L) 10/31/2015 1002   AST 15 10/31/2015 1002   ALT 18 10/31/2015 1002   ALKPHOS 138 10/31/2015 1002   BILITOT 0.37 10/31/2015 1002   GFRNONAA >60 09/03/2015 1542   GFRAA >60 09/03/2015 1542    INo results found for: SPEP, UPEP  Lab Results  Component Value Date   WBC 9.4 11/07/2015   NEUTROABS 6.4 11/07/2015   HGB 11.1 (L) 11/07/2015   HCT 34.0 (L) 11/07/2015   MCV 86.3 11/07/2015   PLT 395 11/07/2015      Chemistry      Component Value Date/Time   NA 139 10/31/2015 1002   K 3.9 10/31/2015 1002   CL 102 09/03/2015 1542   CO2 25 10/31/2015 1002   BUN 8.8 10/31/2015  1002   CREATININE 0.8 10/31/2015 1002      Component Value Date/Time   CALCIUM 9.3 10/31/2015 1002   ALKPHOS 138 10/31/2015 1002   AST 15 10/31/2015 1002   ALT 18 10/31/2015 1002   BILITOT 0.37 10/31/2015 1002       No results found for: LABCA2  No components found for: LABCA125  No results for input(s): INR in the last 168 hours.  Urinalysis No results found for: COLORURINE, APPEARANCEUR, LABSPEC, PHURINE, GLUCOSEU, HGBUR, BILIRUBINUR, KETONESUR, PROTEINUR, UROBILINOGEN, NITRITE, LEUKOCYTESUR  STUDIES: No results found.  ELIGIBLE FOR AVAILABLE RESEARCH PROTOCOL: PALLAS, ALLIANCE  ASSESSMENT: 70 y.o. [place] woman status Parrish left breast upper outer quadrant and left axillary lymph node biopsy 08/13/2015 both positive for an invasive ductal carcinoma, grade 2 or 3, triple positive, with an MIB-1 of 50%  (1) neoadjuvant chemotherapy consisting of carboplatin, docetaxel, trastuzumab and pertuzumab given every 21 days Started 09/11/2015  (a) treatment changed to Abraxane and trastuzumab with cycle 2 because of significant problems after cycle 1  (b) Abraxane discontinued after 3d dose (10/17/2015) because of peripheral neuropathy  (c) cyclophosphamide, methotrexate and fluorouracil (CMF) started 11/07/2015, 4 cycles  (2) trastuzumab will be continued to complete a year  (a) most recent echocardiogram 09/09/2015 showed an ejection fraction of 60-65%   (3) definitive surgery to follow with TAD vs. consideration of the Alliance trial  (4) adjuvant radiation to follow as appropriate  (5) anti-estrogens to follow at the completion of local treatment, with consideration of the PALLAS trial  PLAN: Kristine Parrish will start her CMF chemotherapy today. She has a good understanding of the possible toxicities, side effects and complications of the involved agents. She will receive Herceptin in one week and I will adjust those doses so that 2 weeks later she will be able to receive all her  medications at the same time.  The plan is to proceed to 4 cycles of CMF, after which she will proceed to surgery.  She knows to call for any problems that may develop before the next scheduled visit  Chauncey Cruel, MD   11/07/2015 9:47 AM Medical Oncology and Hematology [address]"we'll as you are not in a left sensory when she shows is a

## 2015-11-07 NOTE — Telephone Encounter (Signed)
appt made and avs printed °

## 2015-11-07 NOTE — Patient Instructions (Signed)
[facility] Discharge Instructions for Patients   Today you received the following: Cytoxan, methotrexate, 5FU  To help prevent nausea and vomiting after your treatment, we encourage you to take your nausea medication as directed.   If you develop nausea and vomiting that is not controlled by your nausea medication, call the clinic.   BELOW ARE SYMPTOMS THAT SHOULD BE REPORTED IMMEDIATELY:  *FEVER GREATER THAN 100.5 F  *CHILLS WITH OR WITHOUT FEVER  NAUSEA AND VOMITING THAT IS NOT CONTROLLED WITH YOUR NAUSEA MEDICATION  *UNUSUAL SHORTNESS OF BREATH  *UNUSUAL BRUISING OR BLEEDING  TENDERNESS IN MOUTH AND THROAT WITH OR WITHOUT PRESENCE OF ULCERS  *URINARY PROBLEMS  *BOWEL PROBLEMS  UNUSUAL RASH Items with * indicate a potential emergency and should be followed up as soon as possible.  Feel free to call the clinic you have any questions or concerns. The clinic phone number is [phone].  Please show the CHEMO ALERT CARD at check-in to the Emergency Department and triage nurse.   

## 2015-11-12 ENCOUNTER — Ambulatory Visit: Payer: BLUE CROSS/BLUE SHIELD

## 2015-11-12 ENCOUNTER — Other Ambulatory Visit: Payer: BLUE CROSS/BLUE SHIELD

## 2015-11-14 ENCOUNTER — Encounter: Payer: Self-pay | Admitting: *Deleted

## 2015-11-14 ENCOUNTER — Other Ambulatory Visit: Payer: Self-pay | Admitting: Oncology

## 2015-11-14 ENCOUNTER — Other Ambulatory Visit (HOSPITAL_BASED_OUTPATIENT_CLINIC_OR_DEPARTMENT_OTHER): Payer: BLUE CROSS/BLUE SHIELD

## 2015-11-14 ENCOUNTER — Ambulatory Visit (HOSPITAL_BASED_OUTPATIENT_CLINIC_OR_DEPARTMENT_OTHER): Payer: BLUE CROSS/BLUE SHIELD

## 2015-11-14 VITALS — BP 134/74 | HR 88 | Temp 97.0°F | Resp 16

## 2015-11-14 DIAGNOSIS — Z5111 Encounter for antineoplastic chemotherapy: Secondary | ICD-10-CM

## 2015-11-14 DIAGNOSIS — C50412 Malignant neoplasm of upper-outer quadrant of left female breast: Secondary | ICD-10-CM

## 2015-11-14 LAB — CBC WITH DIFFERENTIAL/PLATELET
BASO%: 0.5 % (ref 0.0–2.0)
Basophils Absolute: 0 10*3/uL (ref 0.0–0.1)
EOS%: 3.1 % (ref 0.0–7.0)
Eosinophils Absolute: 0.2 10*3/uL (ref 0.0–0.5)
HCT: 32.8 % — ABNORMAL LOW (ref 34.8–46.6)
HGB: 10.8 g/dL — ABNORMAL LOW (ref 11.6–15.9)
LYMPH%: 23.6 % (ref 14.0–49.7)
MCH: 28.4 pg (ref 25.1–34.0)
MCHC: 32.8 g/dL (ref 31.5–36.0)
MCV: 86.5 fL (ref 79.5–101.0)
MONO#: 0.1 10*3/uL (ref 0.1–0.9)
MONO%: 1.1 % (ref 0.0–14.0)
NEUT#: 3.8 10*3/uL (ref 1.5–6.5)
NEUT%: 71.7 % (ref 38.4–76.8)
Platelets: 247 10*3/uL (ref 145–400)
RBC: 3.79 10*6/uL (ref 3.70–5.45)
RDW: 17.4 % — ABNORMAL HIGH (ref 11.2–14.5)
WBC: 5.3 10*3/uL (ref 3.9–10.3)
lymph#: 1.2 10*3/uL (ref 0.9–3.3)

## 2015-11-14 LAB — COMPREHENSIVE METABOLIC PANEL
ALT: 17 U/L (ref 0–55)
AST: 19 U/L (ref 5–34)
Albumin: 3.4 g/dL — ABNORMAL LOW (ref 3.5–5.0)
Alkaline Phosphatase: 153 U/L — ABNORMAL HIGH (ref 40–150)
Anion Gap: 13 mEq/L — ABNORMAL HIGH (ref 3–11)
BUN: 10 mg/dL (ref 7.0–26.0)
CO2: 23 mEq/L (ref 22–29)
Calcium: 8.8 mg/dL (ref 8.4–10.4)
Chloride: 102 mEq/L (ref 98–109)
Creatinine: 0.8 mg/dL (ref 0.6–1.1)
EGFR: 72 mL/min/{1.73_m2} — ABNORMAL LOW (ref >90)
Glucose: 237 mg/dl — ABNORMAL HIGH (ref 70–140)
Potassium: 3.5 mEq/L (ref 3.5–5.1)
Sodium: 138 mEq/L (ref 136–145)
Total Bilirubin: 0.55 mg/dL (ref 0.20–1.20)
Total Protein: 6.8 g/dL (ref 6.4–8.3)

## 2015-11-14 MED ORDER — ACETAMINOPHEN 325 MG PO TABS
ORAL_TABLET | ORAL | Status: AC
  Filled 2015-11-14: qty 2

## 2015-11-14 MED ORDER — HEPARIN SOD (PORK) LOCK FLUSH 100 UNIT/ML IV SOLN
500.0000 [IU] | Freq: Once | INTRAVENOUS | Status: AC | PRN
  Administered 2015-11-14: 500 [IU]
  Filled 2015-11-14: qty 5

## 2015-11-14 MED ORDER — SODIUM CHLORIDE 0.9 % IV SOLN
Freq: Once | INTRAVENOUS | Status: AC
  Administered 2015-11-14: 10:00:00 via INTRAVENOUS

## 2015-11-14 MED ORDER — TRASTUZUMAB CHEMO 150 MG IV SOLR
4.0000 mg/kg | Freq: Once | INTRAVENOUS | Status: AC
  Administered 2015-11-14: 315 mg via INTRAVENOUS
  Filled 2015-11-14: qty 15

## 2015-11-14 MED ORDER — ACETAMINOPHEN 325 MG PO TABS
650.0000 mg | ORAL_TABLET | Freq: Once | ORAL | Status: AC
  Administered 2015-11-14: 650 mg via ORAL

## 2015-11-14 MED ORDER — DIPHENHYDRAMINE HCL 25 MG PO CAPS
ORAL_CAPSULE | ORAL | Status: AC
  Filled 2015-11-14: qty 1

## 2015-11-14 MED ORDER — SODIUM CHLORIDE 0.9% FLUSH
10.0000 mL | INTRAVENOUS | Status: DC | PRN
  Administered 2015-11-14: 10 mL
  Filled 2015-11-14: qty 10

## 2015-11-14 MED ORDER — DIPHENHYDRAMINE HCL 25 MG PO CAPS
25.0000 mg | ORAL_CAPSULE | Freq: Once | ORAL | Status: AC
  Administered 2015-11-14: 25 mg via ORAL

## 2015-11-14 NOTE — Patient Instructions (Signed)
[facility] Discharge Instructions for Patients Receiving Chemotherapy  Today you received the following chemotherapy agents: Herceptin   To help prevent nausea and vomiting after your treatment, we encourage you to take your nausea medication as directed.    If you develop nausea and vomiting that is not controlled by your nausea medication, call the clinic.   BELOW ARE SYMPTOMS THAT SHOULD BE REPORTED IMMEDIATELY:  *FEVER GREATER THAN 100.5 F  *CHILLS WITH OR WITHOUT FEVER  NAUSEA AND VOMITING THAT IS NOT CONTROLLED WITH YOUR NAUSEA MEDICATION  *UNUSUAL SHORTNESS OF BREATH  *UNUSUAL BRUISING OR BLEEDING  TENDERNESS IN MOUTH AND THROAT WITH OR WITHOUT PRESENCE OF ULCERS  *URINARY PROBLEMS  *BOWEL PROBLEMS  UNUSUAL RASH Items with * indicate a potential emergency and should be followed up as soon as possible.  Feel free to call the clinic you have any questions or concerns. The clinic phone number is [phone].  Please show the CHEMO ALERT CARD at check-in to the Emergency Department and triage nurse.   

## 2015-11-18 DIAGNOSIS — E1149 Type 2 diabetes mellitus with other diabetic neurological complication: Secondary | ICD-10-CM | POA: Diagnosis not present

## 2015-11-18 DIAGNOSIS — G629 Polyneuropathy, unspecified: Secondary | ICD-10-CM | POA: Diagnosis not present

## 2015-11-18 DIAGNOSIS — I10 Essential (primary) hypertension: Secondary | ICD-10-CM | POA: Diagnosis not present

## 2015-11-18 DIAGNOSIS — Z8711 Personal history of peptic ulcer disease: Secondary | ICD-10-CM | POA: Diagnosis not present

## 2015-11-28 ENCOUNTER — Other Ambulatory Visit (HOSPITAL_BASED_OUTPATIENT_CLINIC_OR_DEPARTMENT_OTHER): Payer: BLUE CROSS/BLUE SHIELD

## 2015-11-28 ENCOUNTER — Ambulatory Visit (HOSPITAL_BASED_OUTPATIENT_CLINIC_OR_DEPARTMENT_OTHER): Payer: BLUE CROSS/BLUE SHIELD | Admitting: Oncology

## 2015-11-28 ENCOUNTER — Ambulatory Visit (HOSPITAL_BASED_OUTPATIENT_CLINIC_OR_DEPARTMENT_OTHER): Payer: BLUE CROSS/BLUE SHIELD

## 2015-11-28 VITALS — BP 153/83 | HR 92 | Temp 98.4°F | Resp 18 | Ht 65.0 in | Wt 167.6 lb

## 2015-11-28 DIAGNOSIS — C50412 Malignant neoplasm of upper-outer quadrant of left female breast: Secondary | ICD-10-CM

## 2015-11-28 DIAGNOSIS — Z5112 Encounter for antineoplastic immunotherapy: Secondary | ICD-10-CM | POA: Diagnosis not present

## 2015-11-28 DIAGNOSIS — Z5111 Encounter for antineoplastic chemotherapy: Secondary | ICD-10-CM | POA: Diagnosis not present

## 2015-11-28 DIAGNOSIS — C773 Secondary and unspecified malignant neoplasm of axilla and upper limb lymph nodes: Secondary | ICD-10-CM | POA: Diagnosis not present

## 2015-11-28 DIAGNOSIS — R110 Nausea: Secondary | ICD-10-CM | POA: Diagnosis not present

## 2015-11-28 DIAGNOSIS — R197 Diarrhea, unspecified: Secondary | ICD-10-CM

## 2015-11-28 DIAGNOSIS — Z170 Estrogen receptor positive status [ER+]: Secondary | ICD-10-CM

## 2015-11-28 LAB — COMPREHENSIVE METABOLIC PANEL
ALT: 20 U/L (ref 0–55)
AST: 16 U/L (ref 5–34)
Albumin: 3.3 g/dL — ABNORMAL LOW (ref 3.5–5.0)
Alkaline Phosphatase: 179 U/L — ABNORMAL HIGH (ref 40–150)
Anion Gap: 12 mEq/L — ABNORMAL HIGH (ref 3–11)
BUN: 6 mg/dL — ABNORMAL LOW (ref 7.0–26.0)
CO2: 25 mEq/L (ref 22–29)
Calcium: 9.6 mg/dL (ref 8.4–10.4)
Chloride: 104 mEq/L (ref 98–109)
Creatinine: 0.8 mg/dL (ref 0.6–1.1)
EGFR: 73 mL/min/{1.73_m2} — ABNORMAL LOW (ref >90)
Glucose: 160 mg/dl — ABNORMAL HIGH (ref 70–140)
Potassium: 3.5 mEq/L (ref 3.5–5.1)
Sodium: 141 mEq/L (ref 136–145)
Total Bilirubin: 0.39 mg/dL (ref 0.20–1.20)
Total Protein: 6.9 g/dL (ref 6.4–8.3)

## 2015-11-28 LAB — CBC WITH DIFFERENTIAL/PLATELET
BASO%: 0.5 % (ref 0.0–2.0)
Basophils Absolute: 0 10*3/uL (ref 0.0–0.1)
EOS%: 1.8 % (ref 0.0–7.0)
Eosinophils Absolute: 0.1 10*3/uL (ref 0.0–0.5)
HCT: 33.7 % — ABNORMAL LOW (ref 34.8–46.6)
HGB: 11 g/dL — ABNORMAL LOW (ref 11.6–15.9)
LYMPH%: 29.6 % (ref 14.0–49.7)
MCH: 28.7 pg (ref 25.1–34.0)
MCHC: 32.6 g/dL (ref 31.5–36.0)
MCV: 87.9 fL (ref 79.5–101.0)
MONO#: 0.8 10*3/uL (ref 0.1–0.9)
MONO%: 9.7 % (ref 0.0–14.0)
NEUT#: 4.5 10*3/uL (ref 1.5–6.5)
NEUT%: 58.4 % (ref 38.4–76.8)
Platelets: 360 10*3/uL (ref 145–400)
RBC: 3.83 10*6/uL (ref 3.70–5.45)
RDW: 19.5 % — ABNORMAL HIGH (ref 11.2–14.5)
WBC: 7.7 10*3/uL (ref 3.9–10.3)
lymph#: 2.3 10*3/uL (ref 0.9–3.3)

## 2015-11-28 MED ORDER — FLUOROURACIL CHEMO INJECTION 2.5 GM/50ML
600.0000 mg/m2 | Freq: Once | INTRAVENOUS | Status: AC
  Administered 2015-11-28: 1150 mg via INTRAVENOUS
  Filled 2015-11-28: qty 23

## 2015-11-28 MED ORDER — TRASTUZUMAB CHEMO 150 MG IV SOLR
6.0000 mg/kg | Freq: Once | INTRAVENOUS | Status: AC
  Administered 2015-11-28: 462 mg via INTRAVENOUS
  Filled 2015-11-28: qty 22

## 2015-11-28 MED ORDER — PALONOSETRON HCL INJECTION 0.25 MG/5ML
0.2500 mg | Freq: Once | INTRAVENOUS | Status: AC
  Administered 2015-11-28: 0.25 mg via INTRAVENOUS

## 2015-11-28 MED ORDER — SODIUM CHLORIDE 0.9% FLUSH
10.0000 mL | INTRAVENOUS | Status: DC | PRN
  Administered 2015-11-28: 10 mL
  Filled 2015-11-28: qty 10

## 2015-11-28 MED ORDER — SODIUM CHLORIDE 0.9 % IV SOLN
Freq: Once | INTRAVENOUS | Status: AC
  Administered 2015-11-28: 13:00:00 via INTRAVENOUS

## 2015-11-28 MED ORDER — ACETAMINOPHEN 325 MG PO TABS
ORAL_TABLET | ORAL | Status: AC
Start: 2015-11-28 — End: 2015-11-28
  Filled 2015-11-28: qty 2

## 2015-11-28 MED ORDER — METHOTREXATE SODIUM (PF) CHEMO INJECTION 250 MG/10ML
39.7000 mg/m2 | Freq: Once | INTRAMUSCULAR | Status: AC
  Administered 2015-11-28: 75 mg via INTRAVENOUS
  Filled 2015-11-28: qty 3

## 2015-11-28 MED ORDER — CHOLESTYRAMINE 4 G PO PACK: 4.0000 g | PACK | Freq: Three times a day (TID) | ORAL | 12 refills | Status: DC

## 2015-11-28 MED ORDER — SODIUM CHLORIDE 0.9 % IV SOLN
10.0000 mg | Freq: Once | INTRAVENOUS | Status: DC
  Filled 2015-11-28: qty 1

## 2015-11-28 MED ORDER — DIPHENHYDRAMINE HCL 25 MG PO CAPS
25.0000 mg | ORAL_CAPSULE | Freq: Once | ORAL | Status: AC
  Administered 2015-11-28: 25 mg via ORAL

## 2015-11-28 MED ORDER — HEPARIN SOD (PORK) LOCK FLUSH 100 UNIT/ML IV SOLN
500.0000 [IU] | Freq: Once | INTRAVENOUS | Status: AC | PRN
  Administered 2015-11-28: 500 [IU]
  Filled 2015-11-28: qty 5

## 2015-11-28 MED ORDER — SODIUM CHLORIDE 0.9 % IV SOLN
4.0000 mg | Freq: Once | INTRAVENOUS | Status: AC
  Administered 2015-11-28: 4 mg via INTRAVENOUS
  Filled 2015-11-28: qty 0.4

## 2015-11-28 MED ORDER — DIPHENHYDRAMINE HCL 25 MG PO CAPS
ORAL_CAPSULE | ORAL | Status: AC
  Filled 2015-11-28: qty 1

## 2015-11-28 MED ORDER — PALONOSETRON HCL INJECTION 0.25 MG/5ML
INTRAVENOUS | Status: AC
  Filled 2015-11-28: qty 5

## 2015-11-28 MED ORDER — ACETAMINOPHEN 325 MG PO TABS
650.0000 mg | ORAL_TABLET | Freq: Once | ORAL | Status: AC
  Administered 2015-11-28: 650 mg via ORAL

## 2015-11-28 MED ORDER — SODIUM CHLORIDE 0.9 % IV SOLN
600.0000 mg/m2 | Freq: Once | INTRAVENOUS | Status: AC
  Administered 2015-11-28: 1140 mg via INTRAVENOUS
  Filled 2015-11-28: qty 57

## 2015-11-28 NOTE — Addendum Note (Signed)
Addended by: Chauncey Cruel on: 11/28/2015 12:22 PM   Modules accepted: Orders

## 2015-11-28 NOTE — Progress Notes (Signed)
[facility]  Telephone:(336) [phone] Fax:(336) [phone]     ID: CALY PELLUM DOB: 29-Nov-1945  MR#: [id number]  JYN#:[id number]  Patient Care Team: Orpah Melter, MD as PCP - General (Family Medicine) Autumn Messing III, MD as Consulting Physician (General Surgery) Chauncey Cruel, MD as Consulting Physician (Oncology) Eppie Gibson, MD as Attending Physician (Radiation Oncology) Murvin Donning, MD (Dermatology) Laurence Spates, MD as Consulting Physician (Gastroenterology) Rosemary Holms, DPM as Consulting Physician (Podiatry) Viona Gilmore Evette Cristal, MD as Consulting Physician (Obstetrics and Gynecology) Larey Dresser, MD as Consulting Physician (Cardiology) Benson Norway, RN as Registered Nurse PCP: Orpah Melter, MD OTHER MD:  CHIEF COMPLAINT: Estrogen receptor positive breast cancer  CURRENT TREATMENT: Neoadjuvant chemotherapy and anti-HER-2 immunotherapy   BREAST CANCER HISTORY: From the original intake note  Lorenza herself noted a change in her left breast and brought it to the attention of Dr. Nori Riis, who set her up for bilateral diagnostic mammography with tomography and left breast ultrasonography at the [facility] 08/09/2015. The breast density was category D. In the area of palpable concern there was a mass with irregular margins and architectural distortion, measuring approximately 3 cm. This was palpable in the upper outer quadrant near the nipple. The ultrasound confirmed an irregular hypoechoic mass approximately 3 cm from the nipple at the 10:00 position measuring 2.7 cm. Ultrasound of the left axilla found a single level I left axillary node with focal cortical thickening.  On 08/13/2015 the patient underwent biopsy of the left breast mass and the suspicious left axillary lymph node, both showing invasive ductal carcinoma, grade 2 or 3, estrogen receptor 100% positive, progesterone receptor 100% positive, both with strong staining intensity, with an MIB-1 of  50%, and HER-2 amplification, the signals ratio being 2.2 to and the number per cell 3.55  The patient's subsequent history is as detailed below  INTERVAL HISTORY: [name] returns today for follow-up of her breast cancer accompanied by a friend. Today is day 1 cycle 2 of 4 planned cycles of CMF and Herceptin which she will receive every 21 days for 4 cycles  REVIEW OF SYSTEMS: Yasmyn was started on metformin by Dr. Doyle Askew. She was warned that she would have some diarrhea and indeed she is having at. She is using Imodium appropriately. Her gabapentin was also increased. She's had a little bit of nausea with that. She is a little sleepy particularly in the evening. Aside from these issues her ankles are swelling sometimes but she remains very active and particularly this past week she has had good energy. She did not have problems with mouth sores, nausea or vomiting from the CMF chemotherapy. She does feel somewhat fatigued from the Herceptin. Detailed review of systems today was otherwise stable.  PAST MEDICAL HISTORY: Past Medical History:  Diagnosis Date  . Breast cancer of upper-outer quadrant of left female breast ([place]) 08/15/2015  . Colon polyps   . GERD (gastroesophageal reflux disease)   . Hiatal hernia   . History of bronchitis   . Hypertension   . Peptic ulcer disease   . Peripheral neuropathy ([place])   . Type 2 diabetes mellitus ([place])     PAST SURGICAL HISTORY: Past Surgical History:  Procedure Laterality Date  . APPENDECTOMY  1974  . cataract surgery  04/2012  . CHOLECYSTECTOMY  1996  . COLONOSCOPY    . FOOT SURGERY     left bunion removed  . nerve removed from left foot  2000  . PORTACATH PLACEMENT N/A  09/05/2015   Procedure: INSERTION PORT-A-CATH;  Surgeon: Autumn Messing III, MD;  Location: [facility];  Service: General;  Laterality: N/A;  . TUBAL LIGATION    . VAGINAL HYSTERECTOMY  1995    FAMILY HISTORY Family History  Problem Relation Age of Onset  . Diabetes Mother   .  Hypertension Mother   . Alcoholism Maternal Grandfather   . Congestive Heart Failure Maternal Grandmother   The patient has little information about her father and is not sure of his cause of death or age at death. The patient's mother died at age 57. The patient had no siblings. She was raised by her grandmother. She is not aware of any breast or ovarian cancer history in the family   GYNECOLOGIC HISTORY:  No LMP recorded. Patient has had a hysterectomy.  menarche age 69, first live birth age 16. The patient is GX P2. She underwent hysterectomy with bilateral salpingo-oophorectomy in 1994, and has been on estrogen replacement since that time, discontinued June 2017.   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 Daughter [name] lives in [place] and works as a Teacher, music for the  [employer]. Son Ahleah Simko lives in [place] where he is Secondary school teacher. The patient had one grandchild. She attends [employer]. The patient's daughter [name] is her healthcare power of attorney.     HEALTH MAINTENANCE: Social History  Substance Use Topics  . Smoking status: Never Smoker  . Smokeless tobacco: Never Used  . Alcohol use No     Colonoscopy: 2012/[facility]  PAP:  Bone density: 2015/"normal" at Dr. Verlon Au  Lipid panel:  No Known Allergies  Current Outpatient Prescriptions  Medication Sig Dispense Refill  . aspirin 81 MG tablet Take 81 mg by mouth daily.    Marland Kitchen gabapentin (NEURONTIN) 600 MG tablet TAKE ONE TABLET BY MOUTH THREE TIMES DAILY  90 tablet 5  . hydrochlorothiazide (MICROZIDE) 12.5 MG capsule Take 12.5 mg by mouth daily.    Marland Kitchen lovastatin (MEVACOR) 20 MG tablet Take 20 mg by mouth at bedtime.    . Multiple Vitamin (MULTIVITAMIN) tablet Take 1 tablet by mouth daily.    Marland Kitchen omeprazole (PRILOSEC) 20 MG capsule Take 20 mg  by mouth daily.    . potassium chloride (MICRO-K) 10 MEQ CR capsule Take 2 capsules (20 mEq total) by mouth daily. 60 capsule 0  . Probiotic Product (PROBIOTIC DAILY PO) Take by mouth. Reported on 08/21/2015     No current facility-administered medications for this visit.     OBJECTIVE: Middle-aged white woman in no acute distress  Vitals:   11/28/15 0941  BP: (!) 153/83  Pulse: 92  Resp: 18  Temp: 98.4 F (36.9 C)     Body mass index is 27.89 kg/m.    ECOG FS:1 - Symptomatic but completely ambulatory  Sclerae unicteric, pupils round and equal Oropharynx clear and moist-- no thrush or other lesions No cervical or supraclavicular adenopathy Lungs no rales or rhonchi Heart regular rate and rhythm Abd soft, nontender, positive bowel sounds MSK no focal spinal tenderness, no upper extremity lymphedema Neuro: nonfocal, well oriented, appropriate affect Breasts: Deferred    LAB RESULTS:  CMP     Component Value Date/Time   NA 141 11/28/2015 0923   K 3.5 11/28/2015 0923   CL 102 09/03/2015 1542   CO2 25  11/28/2015 0923   GLUCOSE 160 (H) 11/28/2015 0923   BUN 6.0 (L) 11/28/2015 0923   CREATININE 0.8 11/28/2015 0923   CALCIUM 9.6 11/28/2015 0923   PROT 6.9 11/28/2015 0923   ALBUMIN 3.3 (L) 11/28/2015 0923   AST 16 11/28/2015 0923   ALT 20 11/28/2015 0923   ALKPHOS 179 (H) 11/28/2015 0923   BILITOT 0.39 11/28/2015 0923   GFRNONAA >60 09/03/2015 1542   GFRAA >60 09/03/2015 1542    INo results found for: SPEP, UPEP  Lab Results  Component Value Date   WBC 7.7 11/28/2015   NEUTROABS 4.5 11/28/2015   HGB 11.0 (L) 11/28/2015   HCT 33.7 (L) 11/28/2015   MCV 87.9 11/28/2015   PLT 360 11/28/2015      Chemistry      Component Value Date/Time   NA 141 11/28/2015 0923   K 3.5 11/28/2015 0923   CL 102 09/03/2015 1542   CO2 25 11/28/2015 0923   BUN 6.0 (L) 11/28/2015 0923   CREATININE 0.8 11/28/2015 0923      Component Value Date/Time   CALCIUM 9.6 11/28/2015 0923    ALKPHOS 179 (H) 11/28/2015 0923   AST 16 11/28/2015 0923   ALT 20 11/28/2015 0923   BILITOT 0.39 11/28/2015 0923       No results found for: LABCA2  No components found for: [id number]  No results for input(s): INR in the last 168 hours.  Urinalysis No results found for: COLORURINE, APPEARANCEUR, LABSPEC, PHURINE, GLUCOSEU, HGBUR, BILIRUBINUR, KETONESUR, PROTEINUR, UROBILINOGEN, NITRITE, LEUKOCYTESUR  STUDIES: No results found.  ELIGIBLE FOR AVAILABLE RESEARCH PROTOCOL: PALLAS, ALLIANCE  ASSESSMENT: 70 y.o. [place] woman status post left breast upper outer quadrant and left axillary lymph node biopsy 08/13/2015 both positive for an invasive ductal carcinoma, grade 2 or 3, triple positive, with an MIB-1 of 50%  (1) neoadjuvant chemotherapy consisting of carboplatin, docetaxel, trastuzumab and pertuzumab given every 21 days Started 09/11/2015  (a) treatment changed to Abraxane and trastuzumab with cycle 2 because of significant problems after cycle 1  (b) Abraxane discontinued after 3d dose (10/17/2015) because of peripheral neuropathy  (c) cyclophosphamide, methotrexate and fluorouracil (CMF) started 11/07/2015, 4 cycles  (2) trastuzumab will be continued to complete a year  (a) most recent echocardiogram 09/09/2015 showed an ejection fraction of 60-65%   (3) definitive surgery to follow with TAD vs. consideration of the Alliance trial  (4) adjuvant radiation to follow as appropriate  (5) anti-estrogens to follow at the completion of local treatment, with consideration of the PALLAS trial  PLAN: Verita tolerated her first cycle of CMF well and the plan is to repeat that today and then 2 more times, 3 weeks apart. After that of course she will continue with Herceptin every 3 weeks to complete a total of a year. She understands she can proceed to surgery and radiation despite continuing Herceptin and she hopes to have all her treatment done before the end of the year for  insurance reasons.  She is doing well on the diarrhea part and is very happy that her sugars are better. I am adding Questran and she will take a packet every morning. If that is not sufficient to guide with the Imodium and then she can do the Questran twice a day. I hope that that will be sufficient to control the diarrhea issue.  I reassured her that her increase in the gabapentin dose is small and is unlikely to be the cause of her nausea. In any case she does  have nausea medication which she is using appropriately  She will see me again in 3 weeks. She knows to call for any problems that may develop before then.  Chauncey Cruel, MD   11/28/2015 10:01 AM Medical Oncology and Hematology [address]"we'll as you are not in a left sensory when she shows is a

## 2015-11-28 NOTE — Progress Notes (Signed)
During process of giving premeds which included Dexamethasone patient advised that previously she had been given steroids at home and for her infusion and her blood sugar was over 400 following.  Patient was concerned about whether she should be getting the steroid.  Per previous note on last dose of steroid given, found Dr. Jana Hakim had excluded the steroid.  Patient's regimen changed today and included Methotrexate.   Spoke with Dr. Jana Hakim and he changed Dexamethasone from 10 mg to 4 mg as patient needed some steroid coverage.  Patient also stated that since then she has started hyperglycemic po medications at home.  Patient is agreeable to proceed with lower dose Dexamethasone 4 mg

## 2015-11-28 NOTE — Addendum Note (Signed)
Addended by: Chauncey Cruel on: 11/28/2015 01:08 PM   Modules accepted: Orders

## 2015-12-18 ENCOUNTER — Other Ambulatory Visit: Payer: Self-pay

## 2015-12-18 MED ORDER — POTASSIUM CHLORIDE ER 10 MEQ PO CPCR: 20.0000 meq | ORAL_CAPSULE | Freq: Every day | ORAL | 0 refills | Status: DC

## 2015-12-19 ENCOUNTER — Other Ambulatory Visit (HOSPITAL_BASED_OUTPATIENT_CLINIC_OR_DEPARTMENT_OTHER): Payer: BLUE CROSS/BLUE SHIELD

## 2015-12-19 ENCOUNTER — Ambulatory Visit (HOSPITAL_BASED_OUTPATIENT_CLINIC_OR_DEPARTMENT_OTHER): Payer: BLUE CROSS/BLUE SHIELD

## 2015-12-19 ENCOUNTER — Ambulatory Visit (HOSPITAL_BASED_OUTPATIENT_CLINIC_OR_DEPARTMENT_OTHER): Payer: BLUE CROSS/BLUE SHIELD | Admitting: Oncology

## 2015-12-19 VITALS — BP 136/76 | HR 98 | Temp 98.4°F | Resp 18 | Ht 65.0 in | Wt 169.6 lb

## 2015-12-19 DIAGNOSIS — C773 Secondary and unspecified malignant neoplasm of axilla and upper limb lymph nodes: Secondary | ICD-10-CM | POA: Diagnosis not present

## 2015-12-19 DIAGNOSIS — C50412 Malignant neoplasm of upper-outer quadrant of left female breast: Secondary | ICD-10-CM | POA: Diagnosis not present

## 2015-12-19 DIAGNOSIS — Z5111 Encounter for antineoplastic chemotherapy: Secondary | ICD-10-CM | POA: Diagnosis not present

## 2015-12-19 DIAGNOSIS — Z170 Estrogen receptor positive status [ER+]: Secondary | ICD-10-CM | POA: Diagnosis not present

## 2015-12-19 LAB — CBC WITH DIFFERENTIAL/PLATELET
BASO%: 0.8 % (ref 0.0–2.0)
Basophils Absolute: 0.1 10*3/uL (ref 0.0–0.1)
EOS%: 3 % (ref 0.0–7.0)
Eosinophils Absolute: 0.2 10*3/uL (ref 0.0–0.5)
HCT: 34.8 % (ref 34.8–46.6)
HGB: 10.8 g/dL — ABNORMAL LOW (ref 11.6–15.9)
LYMPH%: 32.8 % (ref 14.0–49.7)
MCH: 28.4 pg (ref 25.1–34.0)
MCHC: 31 g/dL — ABNORMAL LOW (ref 31.5–36.0)
MCV: 91.6 fL (ref 79.5–101.0)
MONO#: 0.8 10*3/uL (ref 0.1–0.9)
MONO%: 11.9 % (ref 0.0–14.0)
NEUT#: 3.4 10*3/uL (ref 1.5–6.5)
NEUT%: 51.5 % (ref 38.4–76.8)
Platelets: 254 10*3/uL (ref 145–400)
RBC: 3.8 10*6/uL (ref 3.70–5.45)
RDW: 17.5 % — ABNORMAL HIGH (ref 11.2–14.5)
WBC: 6.6 10*3/uL (ref 3.9–10.3)
lymph#: 2.2 10*3/uL (ref 0.9–3.3)

## 2015-12-19 LAB — COMPREHENSIVE METABOLIC PANEL
ALT: 21 U/L (ref 0–55)
AST: 15 U/L (ref 5–34)
Albumin: 3.1 g/dL — ABNORMAL LOW (ref 3.5–5.0)
Alkaline Phosphatase: 168 U/L — ABNORMAL HIGH (ref 40–150)
Anion Gap: 14 mEq/L — ABNORMAL HIGH (ref 3–11)
BUN: 8 mg/dL (ref 7.0–26.0)
CO2: 27 mEq/L (ref 22–29)
Calcium: 9.2 mg/dL (ref 8.4–10.4)
Chloride: 101 mEq/L (ref 98–109)
Creatinine: 0.9 mg/dL (ref 0.6–1.1)
EGFR: 68 mL/min/{1.73_m2} — ABNORMAL LOW (ref >90)
Glucose: 234 mg/dl — ABNORMAL HIGH (ref 70–140)
Potassium: 3.6 mEq/L (ref 3.5–5.1)
Sodium: 142 mEq/L (ref 136–145)
Total Bilirubin: 0.48 mg/dL (ref 0.20–1.20)
Total Protein: 6.3 g/dL — ABNORMAL LOW (ref 6.4–8.3)

## 2015-12-19 MED ORDER — SODIUM CHLORIDE 0.9% FLUSH
10.0000 mL | INTRAVENOUS | Status: DC | PRN
  Administered 2015-12-19: 10 mL
  Filled 2015-12-19: qty 10

## 2015-12-19 MED ORDER — METHOTREXATE SODIUM (PF) CHEMO INJECTION 250 MG/10ML
39.7000 mg/m2 | Freq: Once | INTRAMUSCULAR | Status: AC
  Administered 2015-12-19: 75 mg via INTRAVENOUS
  Filled 2015-12-19: qty 3

## 2015-12-19 MED ORDER — SODIUM CHLORIDE 0.9 % IV SOLN
600.0000 mg/m2 | Freq: Once | INTRAVENOUS | Status: AC
  Administered 2015-12-19: 1140 mg via INTRAVENOUS
  Filled 2015-12-19: qty 57

## 2015-12-19 MED ORDER — PALONOSETRON HCL INJECTION 0.25 MG/5ML
INTRAVENOUS | Status: AC
  Filled 2015-12-19: qty 5

## 2015-12-19 MED ORDER — SODIUM CHLORIDE 0.9 % IV SOLN
Freq: Once | INTRAVENOUS | Status: AC
  Administered 2015-12-19: 10:00:00 via INTRAVENOUS

## 2015-12-19 MED ORDER — FLUOROURACIL CHEMO INJECTION 2.5 GM/50ML
600.0000 mg/m2 | Freq: Once | INTRAVENOUS | Status: AC
  Administered 2015-12-19: 1150 mg via INTRAVENOUS
  Filled 2015-12-19: qty 23

## 2015-12-19 MED ORDER — PALONOSETRON HCL INJECTION 0.25 MG/5ML
0.2500 mg | Freq: Once | INTRAVENOUS | Status: AC
  Administered 2015-12-19: 0.25 mg via INTRAVENOUS

## 2015-12-19 MED ORDER — HEPARIN SOD (PORK) LOCK FLUSH 100 UNIT/ML IV SOLN
500.0000 [IU] | Freq: Once | INTRAVENOUS | Status: AC | PRN
  Administered 2015-12-19: 500 [IU]
  Filled 2015-12-19: qty 5

## 2015-12-19 MED ORDER — DEXAMETHASONE SODIUM PHOSPHATE 100 MG/10ML IJ SOLN
4.0000 mg | Freq: Once | INTRAMUSCULAR | Status: AC
  Administered 2015-12-19: 4 mg via INTRAVENOUS
  Filled 2015-12-19: qty 0.4

## 2015-12-19 NOTE — Progress Notes (Signed)
Val RN called to say that Dr. Jana Hakim does not want pt to receive Herceptin today.

## 2015-12-19 NOTE — Patient Instructions (Signed)
[facility] Discharge Instructions for Patients   Today you received the following: Cytoxan, methotrexate, 5FU  To help prevent nausea and vomiting after your treatment, we encourage you to take your nausea medication as directed.   If you develop nausea and vomiting that is not controlled by your nausea medication, call the clinic.   BELOW ARE SYMPTOMS THAT SHOULD BE REPORTED IMMEDIATELY:  *FEVER GREATER THAN 100.5 F  *CHILLS WITH OR WITHOUT FEVER  NAUSEA AND VOMITING THAT IS NOT CONTROLLED WITH YOUR NAUSEA MEDICATION  *UNUSUAL SHORTNESS OF BREATH  *UNUSUAL BRUISING OR BLEEDING  TENDERNESS IN MOUTH AND THROAT WITH OR WITHOUT PRESENCE OF ULCERS  *URINARY PROBLEMS  *BOWEL PROBLEMS  UNUSUAL RASH Items with * indicate a potential emergency and should be followed up as soon as possible.  Feel free to call the clinic you have any questions or concerns. The clinic phone number is [phone].  Please show the [facility] at check-in to the Emergency Department and triage nurse.

## 2015-12-19 NOTE — Progress Notes (Signed)
[facility]  [phone] [phone]     ID: Kristine Parrish DOB: 1945-09-01  MR#: [id number]  NFA#:[id number]  Patient Care Team: Orpah Melter, MD as PCP - General (Family Medicine) Autumn Messing III, MD as Consulting Physician (General Surgery) Chauncey Cruel, MD as Consulting Physician (Oncology) Eppie Gibson, MD as Attending Physician (Radiation Oncology) Murvin Donning, MD (Dermatology) Laurence Spates, MD as Consulting Physician (Gastroenterology) Rosemary Holms, DPM as Consulting Physician (Podiatry) Viona Gilmore Evette Cristal, MD as Consulting Physician (Obstetrics and Gynecology) Larey Dresser, MD as Consulting Physician (Cardiology) Benson Norway, RN as Registered Nurse PCP: Orpah Melter, MD OTHER MD:  CHIEF COMPLAINT: Estrogen receptor positive breast cancer  CURRENT TREATMENT: Neoadjuvant chemotherapy and anti-HER-2 immunotherapy   BREAST CANCER HISTORY: From the original intake note  Kristine Parrish herself noted a change in her left breast and brought it to the attention of Dr. Nori Riis, who set her up for bilateral diagnostic mammography with tomography and left breast ultrasonography at the [facility] 08/09/2015. The breast density was category D. In the area of palpable concern there was a mass with irregular margins and architectural distortion, measuring approximately 3 cm. This was palpable in the upper outer quadrant near the nipple. The ultrasound confirmed an irregular hypoechoic mass approximately 3 cm from the nipple at the 10:00 position measuring 2.7 cm. Ultrasound of the left axilla found a single level I left axillary node with focal cortical thickening.  On 08/13/2015 the patient underwent biopsy of the left breast mass and the suspicious left axillary lymph node, both showing invasive ductal carcinoma, grade 2 or 3, estrogen receptor 100% positive, progesterone receptor 100% positive, both with strong staining intensity, with an MIB-1 of  50%, and HER-2 amplification, the signals ratio being 2.2 to and the number per cell 3.55  The patient's subsequent history is as detailed below  INTERVAL HISTORY: Kristine Parrish returns today for follow-up of her triple positive breast cancer accompanied by her daughter. Today is day 1 cycle 3 of 4 planned cycles of CMF and Herceptin which she will receive every 21 days for 4 cycles  REVIEW OF SYSTEMS: Kristine Parrish felt very tired after her third cycle of chemotherapy. This lasted almost a week. She had some nausea, and took the Compazine every 6 hours which helped, but also made her sleepy. She currently has a little bit of nausea Kristine Parrish, not particularly related to food or the remote chemotherapy dose. This may be associative. She tells me her blood sugars are doing well. She is not exercising regularly but is doing what she can she says. A detailed review of systems today was otherwise stable.  PAST MEDICAL HISTORY: Past Medical History:  Diagnosis Date  . Breast cancer of upper-outer quadrant of left female breast ([place]) 08/15/2015  . Colon polyps   . GERD (gastroesophageal reflux disease)   . Hiatal hernia   . History of bronchitis   . Hypertension   . Peptic ulcer disease   . Peripheral neuropathy ([place])   . Type 2 diabetes mellitus ([place])     PAST SURGICAL HISTORY: Past Surgical History:  Procedure Laterality Date  . APPENDECTOMY  1974  . cataract surgery  04/2012  . CHOLECYSTECTOMY  1996  . COLONOSCOPY    . FOOT SURGERY     left bunion removed  . nerve removed from left foot  2000  . PORTACATH PLACEMENT N/A 09/05/2015   Procedure: INSERTION PORT-A-CATH;  Surgeon: Autumn Messing III, MD;  Location: [facility];  Service: General;  Laterality: N/A;  . TUBAL LIGATION    . VAGINAL HYSTERECTOMY  1995    FAMILY HISTORY Family History  Problem Relation Age of Onset  . Diabetes Mother   . Hypertension Mother   . Alcoholism Maternal Grandfather   . Congestive Heart Failure Maternal Grandmother   The  patient has little information about her father and is not sure of his cause of death or age at death. The patient's mother died at age 75. The patient had no siblings. She was raised by her grandmother. She is not aware of any breast or ovarian cancer history in the family   GYNECOLOGIC HISTORY:  No LMP recorded. Patient has had a hysterectomy.  menarche age 60, first live birth age 70. The patient is GX P2. She underwent hysterectomy with bilateral salpingo-oophorectomy in 1994, and has been on estrogen replacement since that time, discontinued June 2017.   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 Daughter Kristine Parrish lives in [place] and works as a Teacher, music for the  [employer]. Son Kristine Parrish lives in [place] where he is Secondary school teacher. The patient had one grandchild. She attends [employer]. The patient's daughter Kristine Parrish is her healthcare power of attorney.     HEALTH MAINTENANCE: Social History  Substance Use Topics  . Smoking status: Never Smoker  . Smokeless tobacco: Never Used  . Alcohol use No     Colonoscopy: 2012/[facility]  PAP:  Bone density: 2015/"normal" at Dr. Verlon Au  Lipid panel:  No Known Allergies  Current Outpatient Prescriptions  Medication Sig Dispense Refill  . aspirin 81 MG tablet Take 81 mg by mouth daily.    Marland Kitchen gabapentin (NEURONTIN) 600 MG tablet TAKE ONE TABLET BY MOUTH THREE TIMES DAILY  90 tablet 5  . hydrochlorothiazide (MICROZIDE) 12.5 MG capsule Take 12.5 mg by mouth daily.    Marland Kitchen lovastatin (MEVACOR) 20 MG tablet Take 20 mg by mouth at bedtime.    . metFORMIN (GLUCOPHAGE) 500 MG tablet Take by mouth 2 (two) times daily with a meal.    . Multiple Vitamin (MULTIVITAMIN) tablet Take 1 tablet by mouth daily.    Marland Kitchen omeprazole (PRILOSEC) 20 MG capsule Take 20 mg by mouth daily.    .  potassium chloride (MICRO-K) 10 MEQ CR capsule Take 2 capsules (20 mEq total) by mouth daily. 60 capsule 0  . Probiotic Product (PROBIOTIC DAILY PO) Take by mouth. Reported on 08/21/2015    . prochlorperazine (COMPAZINE) 10 MG tablet Take 1 tablet (10 mg total) by mouth every 6 (six) hours as needed for nausea or vomiting. 30 tablet 0   No current facility-administered medications for this visit.    Facility-Administered Medications Ordered in Other Visits  Medication Dose Route Frequency Provider Last Rate Last Dose  . heparin lock flush 100 unit/mL  500 Units Intracatheter Once PRN Chauncey Cruel, MD      . ondansetron [facility]) 8 mg in sodium chloride 0.9 % 50 mL IVPB   Intravenous Once Chauncey Cruel, MD   Stopped at 12/21/15 [phone]    OBJECTIVE: Middle-aged white womanWho appears stated age   44:   12/19/15 0859  BP: 136/76  Pulse: 98  Resp: 18  Temp: 98.4 F (36.9 C)     Body mass index is 28.22 kg/m.    ECOG  FS:1 - Symptomatic but completely ambulatory  Sclerae unicteric, EOMs intact Oropharynx clear and moist No cervical or supraclavicular adenopathy Lungs no rales or rhonchi Heart regular rate and rhythm Abd soft, nontender, positive bowel sounds MSK no focal spinal tenderness, no upper extremity lymphedema Neuro: nonfocal, well oriented, appropriate affect Breasts: Deferred    LAB RESULTS:  CMP     Component Value Date/Time   NA 142 12/19/2015 0815   K 3.6 12/19/2015 0815   CL 102 09/03/2015 1542   CO2 27 12/19/2015 0815   GLUCOSE 234 (H) 12/19/2015 0815   BUN 8.0 12/19/2015 0815   CREATININE 0.9 12/19/2015 0815   CALCIUM 9.2 12/19/2015 0815   PROT 6.3 (L) 12/19/2015 0815   ALBUMIN 3.1 (L) 12/19/2015 0815   AST 15 12/19/2015 0815   ALT 21 12/19/2015 0815   ALKPHOS 168 (H) 12/19/2015 0815   BILITOT 0.48 12/19/2015 0815   GFRNONAA >60 09/03/2015 1542   GFRAA >60 09/03/2015 1542    INo results found for: SPEP, UPEP  Lab Results  Component  Value Date   WBC 6.6 12/19/2015   NEUTROABS 3.4 12/19/2015   HGB 10.8 (L) 12/19/2015   HCT 34.8 12/19/2015   MCV 91.6 12/19/2015   PLT 254 12/19/2015      Chemistry      Component Value Date/Time   NA 142 12/19/2015 0815   K 3.6 12/19/2015 0815   CL 102 09/03/2015 1542   CO2 27 12/19/2015 0815   BUN 8.0 12/19/2015 0815   CREATININE 0.9 12/19/2015 0815      Component Value Date/Time   CALCIUM 9.2 12/19/2015 0815   ALKPHOS 168 (H) 12/19/2015 0815   AST 15 12/19/2015 0815   ALT 21 12/19/2015 0815   BILITOT 0.48 12/19/2015 0815       No results found for: LABCA2  No components found for: LABCA125  No results for input(s): INR in the last 168 hours.  Urinalysis No results found for: COLORURINE, APPEARANCEUR, LABSPEC, PHURINE, GLUCOSEU, HGBUR, BILIRUBINUR, KETONESUR, PROTEINUR, UROBILINOGEN, NITRITE, LEUKOCYTESUR  STUDIES: No results found.  ELIGIBLE FOR AVAILABLE RESEARCH PROTOCOL: PALLAS, ALLIANCE  ASSESSMENT: 70 y.o. [place] woman status Parrish left breast upper outer quadrant and left axillary lymph node biopsy 08/13/2015 both positive for an invasive ductal carcinoma, grade 2 or 3, triple positive, with an MIB-1 of 50%  (1) neoadjuvant chemotherapy consisting of carboplatin, docetaxel, trastuzumab and pertuzumab given every 21 days Started 09/11/2015  (a) treatment changed to Abraxane and trastuzumab with cycle 2 because of side effects after cycle 1  (b) Abraxane discontinued after 3d dose (10/17/2015) because of peripheral neuropathy  (c) cyclophosphamide, methotrexate and fluorouracil (CMF) started 11/07/2015, 4 cycles  (2) trastuzumab will be continued to complete a year  (a) most recent echocardiogram 09/09/2015 showed an ejection fraction of 60-65%   (3) definitive surgery to follow with TAD vs. consideration of the Alliance trial  (4) adjuvant radiation to follow as appropriate  (5) anti-estrogens to follow at the completion of local treatment, with  consideration of the PALLAS trial  PLAN: Kristine Parrish is struggling to complete her chemotherapy without any further dose reductions or delays. She will proceed to CMF treatment today and she only has 1 more chemotherapy cycle to go.  She tells me when we gave her the Herceptin and a separate location from the chemotherapy she tolerated it much better. Accordingly we are not going to do the Herceptin today. She will receive that 1019. She will then see me again in 01/09/2016  for her final chemotherapy dose, and again we will postpone the trastuzumab until a week after that.  At that point she will be ready to have a repeat breast MRI and catch up with her surgeon regarding the plans for definitive surgery  She knows to call for any other problems that may develop before her next visit here.  Chauncey Cruel, MD   12/21/2015 10:01 AM Medical Oncology and Hematology [address]"we'll as you are not in a left sensory when she shows is a

## 2015-12-20 ENCOUNTER — Ambulatory Visit (HOSPITAL_BASED_OUTPATIENT_CLINIC_OR_DEPARTMENT_OTHER): Payer: BLUE CROSS/BLUE SHIELD

## 2015-12-20 ENCOUNTER — Other Ambulatory Visit: Payer: Self-pay | Admitting: Medical Oncology

## 2015-12-20 ENCOUNTER — Other Ambulatory Visit: Payer: Self-pay | Admitting: *Deleted

## 2015-12-20 ENCOUNTER — Other Ambulatory Visit: Payer: Self-pay | Admitting: Oncology

## 2015-12-20 VITALS — BP 120/66 | HR 97 | Temp 98.0°F | Resp 16

## 2015-12-20 DIAGNOSIS — C773 Secondary and unspecified malignant neoplasm of axilla and upper limb lymph nodes: Secondary | ICD-10-CM | POA: Diagnosis not present

## 2015-12-20 DIAGNOSIS — C50412 Malignant neoplasm of upper-outer quadrant of left female breast: Secondary | ICD-10-CM

## 2015-12-20 DIAGNOSIS — R110 Nausea: Secondary | ICD-10-CM | POA: Diagnosis not present

## 2015-12-20 MED ORDER — SODIUM CHLORIDE 0.9 % IV SOLN
Freq: Once | INTRAVENOUS | Status: AC
  Administered 2015-12-20: 08:00:00 via INTRAVENOUS

## 2015-12-20 MED ORDER — POTASSIUM CHLORIDE ER 10 MEQ PO CPCR: 20.0000 meq | ORAL_CAPSULE | Freq: Every day | ORAL | 0 refills | Status: DC

## 2015-12-20 MED ORDER — HEPARIN SOD (PORK) LOCK FLUSH 100 UNIT/ML IV SOLN
500.0000 [IU] | Freq: Once | INTRAVENOUS | Status: AC
  Administered 2015-12-20: 500 [IU] via INTRAVENOUS
  Filled 2015-12-20: qty 5

## 2015-12-20 MED ORDER — SODIUM CHLORIDE 0.9% FLUSH
10.0000 mL | INTRAVENOUS | Status: DC | PRN
  Administered 2015-12-20: 10 mL via INTRAVENOUS
  Filled 2015-12-20: qty 10

## 2015-12-20 NOTE — Patient Instructions (Signed)

## 2015-12-21 ENCOUNTER — Ambulatory Visit (HOSPITAL_BASED_OUTPATIENT_CLINIC_OR_DEPARTMENT_OTHER): Payer: BLUE CROSS/BLUE SHIELD

## 2015-12-21 VITALS — BP 131/68 | HR 105 | Temp 98.3°F | Resp 17

## 2015-12-21 DIAGNOSIS — C50412 Malignant neoplasm of upper-outer quadrant of left female breast: Secondary | ICD-10-CM

## 2015-12-21 MED ORDER — SODIUM CHLORIDE 0.9 % IJ SOLN
10.0000 mL | INTRAMUSCULAR | Status: DC | PRN
  Filled 2015-12-21: qty 10

## 2015-12-21 MED ORDER — SODIUM CHLORIDE 0.9 % IV SOLN: Freq: Once | INTRAVENOUS | Status: DC

## 2015-12-21 MED ORDER — SODIUM CHLORIDE 0.9 % IV SOLN
Freq: Once | INTRAVENOUS | Status: AC
  Administered 2015-12-21: 09:00:00 via INTRAVENOUS

## 2015-12-21 MED ORDER — PROCHLORPERAZINE MALEATE 10 MG PO TABS: 10.0000 mg | ORAL_TABLET | Freq: Four times a day (QID) | ORAL | 0 refills | Status: DC | PRN

## 2015-12-21 MED ORDER — HEPARIN SOD (PORK) LOCK FLUSH 100 UNIT/ML IV SOLN
500.0000 [IU] | Freq: Once | INTRAVENOUS | Status: AC | PRN
  Administered 2015-12-21: 500 [IU]
  Filled 2015-12-21: qty 5

## 2015-12-21 NOTE — Patient Instructions (Signed)

## 2015-12-26 ENCOUNTER — Other Ambulatory Visit (HOSPITAL_BASED_OUTPATIENT_CLINIC_OR_DEPARTMENT_OTHER): Payer: BLUE CROSS/BLUE SHIELD

## 2015-12-26 ENCOUNTER — Ambulatory Visit (HOSPITAL_BASED_OUTPATIENT_CLINIC_OR_DEPARTMENT_OTHER): Payer: BLUE CROSS/BLUE SHIELD

## 2015-12-26 VITALS — BP 137/86 | HR 91 | Temp 98.5°F | Resp 18

## 2015-12-26 DIAGNOSIS — C50412 Malignant neoplasm of upper-outer quadrant of left female breast: Secondary | ICD-10-CM | POA: Diagnosis not present

## 2015-12-26 DIAGNOSIS — Z5112 Encounter for antineoplastic immunotherapy: Secondary | ICD-10-CM | POA: Diagnosis not present

## 2015-12-26 DIAGNOSIS — Z170 Estrogen receptor positive status [ER+]: Secondary | ICD-10-CM

## 2015-12-26 LAB — CBC WITH DIFFERENTIAL/PLATELET
BASO%: 0.5 % (ref 0.0–2.0)
Basophils Absolute: 0 10*3/uL (ref 0.0–0.1)
EOS%: 0.5 % (ref 0.0–7.0)
Eosinophils Absolute: 0 10*3/uL (ref 0.0–0.5)
HCT: 30.1 % — ABNORMAL LOW (ref 34.8–46.6)
HGB: 9.6 g/dL — ABNORMAL LOW (ref 11.6–15.9)
LYMPH%: 19.8 % (ref 14.0–49.7)
MCH: 28.8 pg (ref 25.1–34.0)
MCHC: 31.9 g/dL (ref 31.5–36.0)
MCV: 90.4 fL (ref 79.5–101.0)
MONO#: 0 10*3/uL — ABNORMAL LOW (ref 0.1–0.9)
MONO%: 0.5 % (ref 0.0–14.0)
NEUT#: 3 10*3/uL (ref 1.5–6.5)
NEUT%: 78.7 % — ABNORMAL HIGH (ref 38.4–76.8)
Platelets: 117 10*3/uL — ABNORMAL LOW (ref 145–400)
RBC: 3.33 10*6/uL — ABNORMAL LOW (ref 3.70–5.45)
RDW: 16.4 % — ABNORMAL HIGH (ref 11.2–14.5)
WBC: 3.8 10*3/uL — ABNORMAL LOW (ref 3.9–10.3)
lymph#: 0.8 10*3/uL — ABNORMAL LOW (ref 0.9–3.3)

## 2015-12-26 LAB — COMPREHENSIVE METABOLIC PANEL
ALT: 16 U/L (ref 0–55)
AST: 17 U/L (ref 5–34)
Albumin: 3.2 g/dL — ABNORMAL LOW (ref 3.5–5.0)
Alkaline Phosphatase: 153 U/L — ABNORMAL HIGH (ref 40–150)
Anion Gap: 11 mEq/L (ref 3–11)
BUN: 10.7 mg/dL (ref 7.0–26.0)
CO2: 23 mEq/L (ref 22–29)
Calcium: 8.9 mg/dL (ref 8.4–10.4)
Chloride: 103 mEq/L (ref 98–109)
Creatinine: 0.9 mg/dL (ref 0.6–1.1)
EGFR: 65 mL/min/{1.73_m2} — ABNORMAL LOW (ref >90)
Glucose: 283 mg/dl — ABNORMAL HIGH (ref 70–140)
Potassium: 4 mEq/L (ref 3.5–5.1)
Sodium: 138 mEq/L (ref 136–145)
Total Bilirubin: 0.77 mg/dL (ref 0.20–1.20)
Total Protein: 6.3 g/dL — ABNORMAL LOW (ref 6.4–8.3)

## 2015-12-26 MED ORDER — SODIUM CHLORIDE 0.9 % IV SOLN
Freq: Once | INTRAVENOUS | Status: AC
  Administered 2015-12-26: 10:00:00 via INTRAVENOUS

## 2015-12-26 MED ORDER — TRASTUZUMAB CHEMO 150 MG IV SOLR
6.0000 mg/kg | Freq: Once | INTRAVENOUS | Status: AC
  Administered 2015-12-26: 462 mg via INTRAVENOUS
  Filled 2015-12-26: qty 22

## 2015-12-26 MED ORDER — HEPARIN SOD (PORK) LOCK FLUSH 100 UNIT/ML IV SOLN
500.0000 [IU] | Freq: Once | INTRAVENOUS | Status: AC | PRN
  Administered 2015-12-26: 500 [IU]
  Filled 2015-12-26: qty 5

## 2015-12-26 MED ORDER — ACETAMINOPHEN 325 MG PO TABS
ORAL_TABLET | ORAL | Status: AC
  Filled 2015-12-26: qty 2

## 2015-12-26 MED ORDER — SODIUM CHLORIDE 0.9% FLUSH
10.0000 mL | INTRAVENOUS | Status: DC | PRN
  Administered 2015-12-26: 10 mL
  Filled 2015-12-26: qty 10

## 2015-12-26 MED ORDER — DIPHENHYDRAMINE HCL 25 MG PO CAPS
25.0000 mg | ORAL_CAPSULE | Freq: Once | ORAL | Status: AC
  Administered 2015-12-26: 25 mg via ORAL

## 2015-12-26 MED ORDER — ACETAMINOPHEN 325 MG PO TABS
650.0000 mg | ORAL_TABLET | Freq: Once | ORAL | Status: AC
  Administered 2015-12-26: 650 mg via ORAL

## 2015-12-26 MED ORDER — DIPHENHYDRAMINE HCL 25 MG PO CAPS
ORAL_CAPSULE | ORAL | Status: AC
Start: 2015-12-26 — End: 2015-12-26
  Filled 2015-12-26: qty 1

## 2015-12-26 NOTE — Patient Instructions (Signed)
[facility] Discharge Instructions for Patients Receiving Chemotherapy  Today you received the following chemotherapy agents: Herceptin   If you develop nausea and vomiting that is not controlled by your nausea medication, call the clinic.   BELOW ARE SYMPTOMS THAT SHOULD BE REPORTED IMMEDIATELY:  *FEVER GREATER THAN 100.5 F  *CHILLS WITH OR WITHOUT FEVER  NAUSEA AND VOMITING THAT IS NOT CONTROLLED WITH YOUR NAUSEA MEDICATION  *UNUSUAL SHORTNESS OF BREATH  *UNUSUAL BRUISING OR BLEEDING  TENDERNESS IN MOUTH AND THROAT WITH OR WITHOUT PRESENCE OF ULCERS  *URINARY PROBLEMS  *BOWEL PROBLEMS  UNUSUAL RASH Items with * indicate a potential emergency and should be followed up as soon as possible.  Feel free to call the clinic should you have any questions or concerns. The clinic phone number is [phone].  Please show the CHEMO ALERT CARD at check-in to the Emergency Department and triage nurse.   

## 2015-12-31 ENCOUNTER — Other Ambulatory Visit: Payer: Self-pay | Admitting: *Deleted

## 2015-12-31 ENCOUNTER — Ambulatory Visit (HOSPITAL_COMMUNITY)
Admission: RE | Admit: 2015-12-31 | Discharge: 2015-12-31 | Disposition: A | Discharge status: Home / Self Care | Payer: BLUE CROSS/BLUE SHIELD | Source: Ambulatory Visit | Attending: Oncology | Admitting: Oncology

## 2015-12-31 DIAGNOSIS — I313 Pericardial effusion (noninflammatory): Secondary | ICD-10-CM | POA: Insufficient documentation

## 2015-12-31 DIAGNOSIS — I5030 Unspecified diastolic (congestive) heart failure: Secondary | ICD-10-CM | POA: Diagnosis not present

## 2015-12-31 DIAGNOSIS — Z170 Estrogen receptor positive status [ER+]: Secondary | ICD-10-CM

## 2015-12-31 DIAGNOSIS — C50412 Malignant neoplasm of upper-outer quadrant of left female breast: Secondary | ICD-10-CM | POA: Insufficient documentation

## 2015-12-31 NOTE — Progress Notes (Signed)
  Echocardiogram 2D Echocardiogram has been performed.  Donata Clay 12/31/2015, 12:02 PM

## 2016-01-08 ENCOUNTER — Encounter: Payer: Self-pay | Admitting: Pharmacist

## 2016-01-09 ENCOUNTER — Other Ambulatory Visit (HOSPITAL_BASED_OUTPATIENT_CLINIC_OR_DEPARTMENT_OTHER): Payer: BLUE CROSS/BLUE SHIELD

## 2016-01-09 ENCOUNTER — Ambulatory Visit (HOSPITAL_BASED_OUTPATIENT_CLINIC_OR_DEPARTMENT_OTHER): Payer: BLUE CROSS/BLUE SHIELD

## 2016-01-09 ENCOUNTER — Ambulatory Visit (HOSPITAL_BASED_OUTPATIENT_CLINIC_OR_DEPARTMENT_OTHER): Payer: BLUE CROSS/BLUE SHIELD | Admitting: Oncology

## 2016-01-09 VITALS — BP 151/81 | HR 96 | Temp 98.1°F | Resp 17 | Ht 65.0 in | Wt 167.0 lb

## 2016-01-09 DIAGNOSIS — Z5111 Encounter for antineoplastic chemotherapy: Secondary | ICD-10-CM | POA: Diagnosis not present

## 2016-01-09 DIAGNOSIS — C50412 Malignant neoplasm of upper-outer quadrant of left female breast: Secondary | ICD-10-CM

## 2016-01-09 DIAGNOSIS — Z170 Estrogen receptor positive status [ER+]: Secondary | ICD-10-CM

## 2016-01-09 LAB — COMPREHENSIVE METABOLIC PANEL
ALT: 19 U/L (ref 0–55)
AST: 17 U/L (ref 5–34)
Albumin: 3.2 g/dL — ABNORMAL LOW (ref 3.5–5.0)
Alkaline Phosphatase: 186 U/L — ABNORMAL HIGH (ref 40–150)
Anion Gap: 8 mEq/L (ref 3–11)
BUN: 10.4 mg/dL (ref 7.0–26.0)
CO2: 28 mEq/L (ref 22–29)
Calcium: 9.5 mg/dL (ref 8.4–10.4)
Chloride: 103 mEq/L (ref 98–109)
Creatinine: 0.8 mg/dL (ref 0.6–1.1)
EGFR: 70 mL/min/{1.73_m2} — ABNORMAL LOW (ref >90)
Glucose: 187 mg/dl — ABNORMAL HIGH (ref 70–140)
Potassium: 3.8 mEq/L (ref 3.5–5.1)
Sodium: 139 mEq/L (ref 136–145)
Total Bilirubin: 0.34 mg/dL (ref 0.20–1.20)
Total Protein: 6.7 g/dL (ref 6.4–8.3)

## 2016-01-09 LAB — CBC WITH DIFFERENTIAL/PLATELET
BASO%: 0.3 % (ref 0.0–2.0)
Basophils Absolute: 0 10*3/uL (ref 0.0–0.1)
EOS%: 2.7 % (ref 0.0–7.0)
Eosinophils Absolute: 0.2 10*3/uL (ref 0.0–0.5)
HCT: 33.4 % — ABNORMAL LOW (ref 34.8–46.6)
HGB: 10.5 g/dL — ABNORMAL LOW (ref 11.6–15.9)
LYMPH%: 30.9 % (ref 14.0–49.7)
MCH: 28.9 pg (ref 25.1–34.0)
MCHC: 31.4 g/dL — ABNORMAL LOW (ref 31.5–36.0)
MCV: 92 fL (ref 79.5–101.0)
MONO#: 0.6 10*3/uL (ref 0.1–0.9)
MONO%: 9.9 % (ref 0.0–14.0)
NEUT#: 3.6 10*3/uL (ref 1.5–6.5)
NEUT%: 56.2 % (ref 38.4–76.8)
Platelets: 274 10*3/uL (ref 145–400)
RBC: 3.63 10*6/uL — ABNORMAL LOW (ref 3.70–5.45)
RDW: 17.2 % — ABNORMAL HIGH (ref 11.2–14.5)
WBC: 6.4 10*3/uL (ref 3.9–10.3)
lymph#: 2 10*3/uL (ref 0.9–3.3)

## 2016-01-09 LAB — TECHNOLOGIST REVIEW

## 2016-01-09 MED ORDER — DEXAMETHASONE SODIUM PHOSPHATE 10 MG/ML IJ SOLN
4.0000 mg | Freq: Once | INTRAMUSCULAR | Status: AC
  Administered 2016-01-09: 4 mg via INTRAVENOUS

## 2016-01-09 MED ORDER — FLUOROURACIL CHEMO INJECTION 2.5 GM/50ML
600.0000 mg/m2 | Freq: Once | INTRAVENOUS | Status: AC
  Administered 2016-01-09: 1150 mg via INTRAVENOUS
  Filled 2016-01-09: qty 23

## 2016-01-09 MED ORDER — PALONOSETRON HCL INJECTION 0.25 MG/5ML
0.2500 mg | Freq: Once | INTRAVENOUS | Status: AC
  Administered 2016-01-09: 0.25 mg via INTRAVENOUS

## 2016-01-09 MED ORDER — DEXAMETHASONE SODIUM PHOSPHATE 10 MG/ML IJ SOLN
INTRAMUSCULAR | Status: AC
  Filled 2016-01-09: qty 1

## 2016-01-09 MED ORDER — SODIUM CHLORIDE 0.9 % IV SOLN
600.0000 mg/m2 | Freq: Once | INTRAVENOUS | Status: AC
  Administered 2016-01-09: 1140 mg via INTRAVENOUS
  Filled 2016-01-09: qty 57

## 2016-01-09 MED ORDER — PALONOSETRON HCL INJECTION 0.25 MG/5ML
INTRAVENOUS | Status: AC
Start: 2016-01-09 — End: 2016-01-09
  Filled 2016-01-09: qty 5

## 2016-01-09 MED ORDER — METHOTREXATE SODIUM (PF) CHEMO INJECTION 250 MG/10ML
75.0000 mg | Freq: Once | INTRAMUSCULAR | Status: AC
  Administered 2016-01-09: 75 mg via INTRAVENOUS
  Filled 2016-01-09: qty 3

## 2016-01-09 MED ORDER — SODIUM CHLORIDE 0.9% FLUSH
10.0000 mL | INTRAVENOUS | Status: DC | PRN
  Administered 2016-01-09: 10 mL
  Filled 2016-01-09: qty 10

## 2016-01-09 MED ORDER — HEPARIN SOD (PORK) LOCK FLUSH 100 UNIT/ML IV SOLN
500.0000 [IU] | Freq: Once | INTRAVENOUS | Status: AC | PRN
  Administered 2016-01-09: 500 [IU]
  Filled 2016-01-09: qty 5

## 2016-01-09 MED ORDER — SODIUM CHLORIDE 0.9 % IV SOLN
Freq: Once | INTRAVENOUS | Status: AC
  Administered 2016-01-09: 12:00:00 via INTRAVENOUS

## 2016-01-09 NOTE — Patient Instructions (Signed)
[facility] Discharge Instructions for Patients Receiving Chemotherapy  Today you received the following chemotherapy agents 5 FU/Methotrexat/Cytoxan To help prevent nausea and vomiting after your treatment, we encourage you to take your nausea medication as prescribed.   If you develop nausea and vomiting that is not controlled by your nausea medication, call the clinic.   BELOW ARE SYMPTOMS THAT SHOULD BE REPORTED IMMEDIATELY:  *FEVER GREATER THAN 100.5 F  *CHILLS WITH OR WITHOUT FEVER  NAUSEA AND VOMITING THAT IS NOT CONTROLLED WITH YOUR NAUSEA MEDICATION  *UNUSUAL SHORTNESS OF BREATH  *UNUSUAL BRUISING OR BLEEDING  TENDERNESS IN MOUTH AND THROAT WITH OR WITHOUT PRESENCE OF ULCERS  *URINARY PROBLEMS  *BOWEL PROBLEMS  UNUSUAL RASH Items with * indicate a potential emergency and should be followed up as soon as possible.  Feel free to call the clinic you have any questions or concerns. The clinic phone number is [phone].  Please show the [facility] at check-in to the Emergency Department and triage nurse.

## 2016-01-09 NOTE — Progress Notes (Signed)
[facility]  [phone] Fax:(336) [phone]     ID: GERENE NEDD DOB: May 17, 1945  MR#: [id number]  AOZ#:[id number]  Patient Care Team: Orpah Melter, MD as PCP - General (Family Medicine) Autumn Messing III, MD as Consulting Physician (General Surgery) Chauncey Cruel, MD as Consulting Physician (Oncology) Eppie Gibson, MD as Attending Physician (Radiation Oncology) Murvin Donning, MD (Dermatology) Laurence Spates, MD as Consulting Physician (Gastroenterology) Rosemary Holms, DPM as Consulting Physician (Podiatry) Viona Gilmore Evette Cristal, MD as Consulting Physician (Obstetrics and Gynecology) Larey Dresser, MD as Consulting Physician (Cardiology) Benson Norway, RN as Registered Nurse PCP: Orpah Melter, MD OTHER MD:  CHIEF COMPLAINT: Estrogen receptor positive breast cancer  CURRENT TREATMENT: Neoadjuvant chemotherapy and anti-HER-2 immunotherapy   BREAST CANCER HISTORY: From the original intake note  Joslynn herself noted a change in her left breast and brought it to the attention of Dr. Nori Riis, who set her up for bilateral diagnostic mammography with tomography and left breast ultrasonography at the [facility] 08/09/2015. The breast density was category D. In the area of palpable concern there was a mass with irregular margins and architectural distortion, measuring approximately 3 cm. This was palpable in the upper outer quadrant near the nipple. The ultrasound confirmed an irregular hypoechoic mass approximately 3 cm from the nipple at the 10:00 position measuring 2.7 cm. Ultrasound of the left axilla found a single level I left axillary node with focal cortical thickening.  On 08/13/2015 the patient underwent biopsy of the left breast mass and the suspicious left axillary lymph node, both showing invasive ductal carcinoma, grade 2 or 3, estrogen receptor 100% positive, progesterone receptor 100% positive, both with strong staining intensity, with an MIB-1 of  50%, and HER-2 amplification, the signals ratio being 2.2 to and the number per cell 3.55  The patient's subsequent history is as detailed below  INTERVAL HISTORY: [name] returns today for follow-up of her HER-2/neu and estrogen receptor positive breast cancer accompanied by her daughter [name]. Today is day 1 cycle 4 of 4 planned cycles of CMF and Herceptin which she will receive every 21 days for 4 cycles  Repeat echocardiogram 12/31/2015 shows a well-preserved ejection fraction  REVIEW OF SYSTEMS: Najee is doing "good", but she still feels tired, especially in the evenings. She is working full-time, but her work is sedentary. She has no trouble with that she says. She is sleeping well. She has some sinus symptoms, occasionally her ankles swell, and she is short of breath when she climbs stairs, which she did not have for starting chemotherapy. She tells me her blood sugars are well controlled. She is still having a little bit of diarrhea from her metformin but is not requiring any treatment or other intervention for that. A detailed review of systems today was stable except as noted  PAST MEDICAL HISTORY: Past Medical History:  Diagnosis Date  . Breast cancer of upper-outer quadrant of left female breast ([place]) 08/15/2015  . Colon polyps   . GERD (gastroesophageal reflux disease)   . Hiatal hernia   . History of bronchitis   . Hypertension   . Peptic ulcer disease   . Peripheral neuropathy ([place])   . Type 2 diabetes mellitus ([place])     PAST SURGICAL HISTORY: Past Surgical History:  Procedure Laterality Date  . APPENDECTOMY  1974  . cataract surgery  04/2012  . CHOLECYSTECTOMY  1996  . COLONOSCOPY    . FOOT SURGERY     left bunion removed  .  nerve removed from left foot  2000  . PORTACATH PLACEMENT N/A 09/05/2015   Procedure: INSERTION PORT-A-CATH;  Surgeon: Autumn Messing III, MD;  Location: [facility];  Service: General;  Laterality: N/A;  . TUBAL LIGATION    . VAGINAL HYSTERECTOMY  1995      FAMILY HISTORY Family History  Problem Relation Age of Onset  . Diabetes Mother   . Hypertension Mother   . Alcoholism Maternal Grandfather   . Congestive Heart Failure Maternal Grandmother   The patient has little information about her father and is not sure of his cause of death or age at death. The patient's mother died at age 96. The patient had no siblings. She was raised by her grandmother. She is not aware of any breast or ovarian cancer history in the family   GYNECOLOGIC HISTORY:  No LMP recorded. Patient has had a hysterectomy.  menarche age 65, first live birth age 65. The patient is GX P2. She underwent hysterectomy with bilateral salpingo-oophorectomy in 1994, and has been on estrogen replacement since that time, discontinued June 2017.   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 She is divorced and lives alone, with 2 cats. Daughter [name] lives in [place] and works as a Teacher, music for the  [employer]. Son Kaiesha Tonner lives in [place] where he is Secondary school teacher. The patient had one grandchild. She attends [employer]. The patient's daughter [name] is her healthcare power of attorney.     HEALTH MAINTENANCE: Social History  Substance Use Topics  . Smoking status: Never Smoker  . Smokeless tobacco: Never Used  . Alcohol use No     Colonoscopy: 2012/[facility]  PAP:  Bone density: 2015/"normal" at Dr. Verlon Au  Lipid panel:  No Known Allergies  Current Outpatient Prescriptions  Medication Sig Dispense Refill  . aspirin 81 MG tablet Take 81 mg by mouth daily.    Marland Kitchen gabapentin (NEURONTIN) 600 MG tablet TAKE ONE TABLET BY MOUTH THREE TIMES DAILY  90 tablet 5  . hydrochlorothiazide (MICROZIDE) 12.5 MG capsule Take 12.5 mg by mouth daily.    Marland Kitchen lovastatin (MEVACOR) 20 MG tablet Take 20 mg by mouth at bedtime.    . metFORMIN (GLUCOPHAGE) 500  MG tablet Take by mouth 2 (two) times daily with a meal.    . Multiple Vitamin (MULTIVITAMIN) tablet Take 1 tablet by mouth daily.    Marland Kitchen omeprazole (PRILOSEC) 20 MG capsule Take 20 mg by mouth daily.    . potassium chloride (MICRO-K) 10 MEQ CR capsule Take 2 capsules (20 mEq total) by mouth daily. 60 capsule 0  . Probiotic Product (PROBIOTIC DAILY PO) Take by mouth. Reported on 08/21/2015    . prochlorperazine (COMPAZINE) 10 MG tablet Take 1 tablet (10 mg total) by mouth every 6 (six) hours as needed for nausea or vomiting. 30 tablet 0   No current facility-administered medications for this visit.    Facility-Administered Medications Ordered in Other Visits  Medication Dose Route Frequency Provider Last Rate Last Dose  . sodium chloride flush (NS) 0.9 % injection 10 mL  10 mL Intracatheter PRN Chauncey Cruel, MD   10 mL at 01/09/16 1317    OBJECTIVE: Middle-aged white woman In no acute distress  Vitals:   01/09/16 0959  BP: (!) 151/81  Pulse: 96  Resp: 17  Temp: 98.1 F (36.7 C)     Body  mass index is 27.79 kg/m.    ECOG FS:1 - Symptomatic but completely ambulatory  Sclerae unicteric, pupils round and equal Oropharynx clear and moist-- no thrush or other lesions No cervical or supraclavicular adenopathy Lungs no rales or rhonchi Heart regular rate and rhythm Abd soft, nontender, positive bowel sounds MSK no focal spinal tenderness, no upper extremity lymphedema Neuro: nonfocal, well oriented, appropriate affect Breasts: The right breast is unremarkable. I know longer palpate a mass in the left breast. There are no skin or nipple changes of concern. The left axilla is benign.   LAB RESULTS:  CMP     Component Value Date/Time   NA 139 01/09/2016 0947   K 3.8 01/09/2016 0947   CL 102 09/03/2015 1542   CO2 28 01/09/2016 0947   GLUCOSE 187 (H) 01/09/2016 0947   BUN 10.4 01/09/2016 0947   CREATININE 0.8 01/09/2016 0947   CALCIUM 9.5 01/09/2016 0947   PROT 6.7 01/09/2016  0947   ALBUMIN 3.2 (L) 01/09/2016 0947   AST 17 01/09/2016 0947   ALT 19 01/09/2016 0947   ALKPHOS 186 (H) 01/09/2016 0947   BILITOT 0.34 01/09/2016 0947   GFRNONAA >60 09/03/2015 1542   GFRAA >60 09/03/2015 1542    INo results found for: SPEP, UPEP  Lab Results  Component Value Date   WBC 6.4 01/09/2016   NEUTROABS 3.6 01/09/2016   HGB 10.5 (L) 01/09/2016   HCT 33.4 (L) 01/09/2016   MCV 92.0 01/09/2016   PLT 274 01/09/2016      Chemistry      Component Value Date/Time   NA 139 01/09/2016 0947   K 3.8 01/09/2016 0947   CL 102 09/03/2015 1542   CO2 28 01/09/2016 0947   BUN 10.4 01/09/2016 0947   CREATININE 0.8 01/09/2016 0947      Component Value Date/Time   CALCIUM 9.5 01/09/2016 0947   ALKPHOS 186 (H) 01/09/2016 0947   AST 17 01/09/2016 0947   ALT 19 01/09/2016 0947   BILITOT 0.34 01/09/2016 0947       No results found for: LABCA2  No components found for: [id number]  No results for input(s): INR in the last 168 hours.  Urinalysis No results found for: COLORURINE, APPEARANCEUR, LABSPEC, PHURINE, GLUCOSEU, HGBUR, BILIRUBINUR, KETONESUR, PROTEINUR, UROBILINOGEN, NITRITE, LEUKOCYTESUR  STUDIES: No results found.  ELIGIBLE FOR AVAILABLE RESEARCH PROTOCOL: PALLAS, ALLIANCE  ASSESSMENT: 70 y.o. [place] woman status post left breast upper outer quadrant and left axillary lymph node biopsy 08/13/2015 both positive for an invasive ductal carcinoma, grade 2 or 3, triple positive, with an MIB-1 of 50%  (1) neoadjuvant chemotherapy consisting of carboplatin, docetaxel, trastuzumab and pertuzumab given every 21 days Started 09/11/2015  (a) treatment changed to Abraxane and trastuzumab with cycle 2 because of side effects after cycle 1  (b) Abraxane discontinued after 3d dose (10/17/2015) because of peripheral neuropathy  (c) cyclophosphamide, methotrexate and fluorouracil (CMF) started 11/07/2015, 4 cycles, last dose 01/09/2016   (2) trastuzumab will be continued  to complete a year  (a) most recent echocardiogram 09/09/2015 showed an ejection fraction of 60-65%   (3) definitive surgery to follow with TAD vs. consideration of the Alliance trial  (4) adjuvant radiation to follow as appropriate  (5) anti-estrogens to follow at the completion of local treatment, with consideration of the PALLAS trial  PLAN: Leighann will complete the chemotherapy portion of her treatment today. She has done a terrific job of putting up with  side effects and complications and has managed to  get to this point without any major problems.  Of course we are continuing Herceptin. She will receive that next week and then every 3 weeks through June of next year.  I have set her up for a repeat breast MRI November 13 or so. She likely will see her surgeon shortly thereafter to plan her surgery.  I'm going to see her next week just to make sure that her counts  Are up and that she doesn't have any remaining issues or questions regarding her chemotherapy.  She knows to call for any problems that may develop before that visit.  Chauncey Cruel, MD   01/09/2016 3:09 PM Medical Oncology and Hematology [place]

## 2016-01-10 ENCOUNTER — Other Ambulatory Visit: Payer: Self-pay | Admitting: *Deleted

## 2016-01-10 DIAGNOSIS — C50412 Malignant neoplasm of upper-outer quadrant of left female breast: Secondary | ICD-10-CM

## 2016-01-13 ENCOUNTER — Other Ambulatory Visit: Payer: Self-pay | Admitting: Oncology

## 2016-01-13 NOTE — Progress Notes (Signed)
[facility]  Telephone:(336) [phone] Fax:(336) [phone]     ID: Kristine Parrish DOB: [birth year]  MR#: [id number]  JOI#:[id number]  Patient Care Team: Kristine Melter, MD as PCP - General (Family Medicine) Kristine Messing III, MD as Consulting Physician (General Surgery) Kristine Cruel, MD as Consulting Physician (Oncology) Kristine Gibson, MD as Attending Physician (Radiation Oncology) Kristine Donning, MD (Dermatology) Kristine Spates, MD as Consulting Physician (Gastroenterology) Kristine Parrish, DPM as Consulting Physician (Podiatry) Kristine Gilmore Evette Cristal, MD as Consulting Physician (Obstetrics and Gynecology) Kristine Dresser, MD as Consulting Physician (Cardiology) Kristine Norway, RN as Registered Nurse PCP: Kristine Melter, MD OTHER MD:  CHIEF COMPLAINT: Estrogen receptor positive breast cancer  CURRENT TREATMENT: Neoadjuvant chemotherapy and anti-HER-2 immunotherapy   BREAST CANCER HISTORY: From the original intake note  Kristine Parrish herself noted a change in her left breast and brought it to the attention of Kristine Parrish, who set her up for bilateral diagnostic mammography with tomography and left breast ultrasonography at the [facility] 08/09/2015. The breast density was category D. In the area of palpable concern there was a mass with irregular margins and architectural distortion, measuring approximately 3 cm. This was palpable in the upper outer quadrant near the nipple. The ultrasound confirmed an irregular hypoechoic mass approximately 3 cm from the nipple at the 10:00 position measuring 2.7 cm. Ultrasound of the left axilla found a single level I left axillary node with focal cortical thickening.  On 08/13/2015 the patient underwent biopsy of the left breast mass and the suspicious left axillary lymph node, both showing invasive ductal carcinoma, grade 2 or 3, estrogen receptor 100% positive, progesterone receptor 100% positive, both with strong staining intensity, with an MIB-1 of  50%, and HER-2 amplification, the signals ratio being 2.2 to and the number per cell 3.55  The patient's subsequent history is as detailed below  INTERVAL HISTORY: Kristine Parrish returns today for follow-up of her HER-2/neu and estrogen receptor positive breast cancer accompanied by her daughter Kristine Parrish. Today is day 1 cycle 4 of 4 planned cycles of CMF and Herceptin which she will receive every 21 days for 4 cycles  Repeat echocardiogram 12/31/2015 shows a well-preserved ejection fraction  REVIEW OF SYSTEMS: Kristine Parrish is doing "good", but she still feels tired, especially in the evenings. She is working full-time, but her work is sedentary. She has no trouble with that she says. She is sleeping well. She has some sinus symptoms, occasionally her ankles swell, and she is short of breath when she climbs stairs, which she did not have for starting chemotherapy. She tells me her blood sugars are well controlled. She is still having a little bit of diarrhea from her metformin but is not requiring any treatment or other intervention for that. A detailed review of systems today was stable except as noted  PAST MEDICAL HISTORY: Past Medical History:  Diagnosis Date  . Breast cancer of upper-outer quadrant of left female breast ([facility]) 08/15/2015  . Colon polyps   . GERD (gastroesophageal reflux disease)   . Hiatal hernia   . History of bronchitis   . Hypertension   . Peptic ulcer disease   . Peripheral neuropathy ([place])   . Type 2 diabetes mellitus ([place])     PAST SURGICAL HISTORY: Past Surgical History:  Procedure Laterality Date  . APPENDECTOMY  1974  . cataract surgery  04/2012  . CHOLECYSTECTOMY  1996  . COLONOSCOPY    . FOOT SURGERY     left bunion removed  .  nerve removed from left foot  2000  . PORTACATH PLACEMENT N/A 09/05/2015   Procedure: INSERTION PORT-A-CATH;  Surgeon: Kristine Messing III, MD;  Location: [facility];  Service: General;  Laterality: N/A;  . TUBAL LIGATION    . VAGINAL HYSTERECTOMY  1995      FAMILY HISTORY Family History  Problem Relation Age of Onset  . Diabetes Mother   . Hypertension Mother   . Alcoholism Maternal Grandfather   . Congestive Heart Failure Maternal Grandmother   The patient has little information about her father and is not sure of his cause of death or age at death. The patient's mother died at age 49. The patient had no siblings. She was raised by her grandmother. She is not aware of any breast or ovarian cancer history in the family   GYNECOLOGIC HISTORY:  No LMP recorded. Patient has had a hysterectomy.  menarche age 89, first live birth age 89. The patient is GX P2. She underwent hysterectomy with bilateral salpingo-oophorectomy in 1994, and has been on estrogen replacement since that time, discontinued June 2017.   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 Daughter Kristine Parrish lives in [place] and works as a Teacher, music for the  [employer]. Son Kristine Parrish lives in [place] where he is Secondary school teacher. The patient had one grandchild. She attends [employer]. The patient's daughter Kristine Parrish is her healthcare power of attorney.     HEALTH MAINTENANCE: Social History  Substance Use Topics  . Smoking status: Never Smoker  . Smokeless tobacco: Never Used  . Alcohol use No     Colonoscopy: 2012/[facility]  PAP:  Bone density: 2015/"normal" at Dr. Verlon Au  Lipid panel:  No Known Allergies  Current Outpatient Prescriptions  Medication Sig Dispense Refill  . aspirin 81 MG tablet Take 81 mg by mouth daily.    Marland Kitchen gabapentin (NEURONTIN) 600 MG tablet TAKE ONE TABLET BY MOUTH THREE TIMES DAILY  90 tablet 5  . hydrochlorothiazide (MICROZIDE) 12.5 MG capsule Take 12.5 mg by mouth daily.    Marland Kitchen lovastatin (MEVACOR) 20 MG tablet Take 20 mg by mouth at bedtime.    . metFORMIN (GLUCOPHAGE) 500  MG tablet Take by mouth 2 (two) times daily with a meal.    . Multiple Vitamin (MULTIVITAMIN) tablet Take 1 tablet by mouth daily.    Marland Kitchen omeprazole (PRILOSEC) 20 MG capsule Take 20 mg by mouth daily.    . potassium chloride (MICRO-K) 10 MEQ CR capsule Take 2 capsules (20 mEq total) by mouth daily. 60 capsule 0  . Probiotic Product (PROBIOTIC DAILY PO) Take by mouth. Reported on 08/21/2015    . prochlorperazine (COMPAZINE) 10 MG tablet Take 1 tablet (10 mg total) by mouth every 6 (six) hours as needed for nausea or vomiting. 30 tablet 0   No current facility-administered medications for this visit.     OBJECTIVE: Middle-aged white woman In no acute distress  There were no vitals filed for this visit.   There is no height or weight on file to calculate BMI.    ECOG FS:1 - Symptomatic but completely ambulatory  Sclerae unicteric, pupils round and equal Oropharynx clear and moist-- no thrush or other lesions No cervical or supraclavicular adenopathy Lungs no rales or rhonchi Heart regular rate and rhythm Abd soft, nontender, positive bowel sounds MSK no focal  spinal tenderness, no upper extremity lymphedema Neuro: nonfocal, well oriented, appropriate affect Breasts: The right breast is unremarkable. I know longer palpate a mass in the left breast. There are no skin or nipple changes of concern. The left axilla is benign.   LAB RESULTS:  CMP     Component Value Date/Time   NA 139 01/09/2016 0947   K 3.8 01/09/2016 0947   CL 102 09/03/2015 1542   CO2 28 01/09/2016 0947   GLUCOSE 187 (H) 01/09/2016 0947   BUN 10.4 01/09/2016 0947   CREATININE 0.8 01/09/2016 0947   CALCIUM 9.5 01/09/2016 0947   PROT 6.7 01/09/2016 0947   ALBUMIN 3.2 (L) 01/09/2016 0947   AST 17 01/09/2016 0947   ALT 19 01/09/2016 0947   ALKPHOS 186 (H) 01/09/2016 0947   BILITOT 0.34 01/09/2016 0947   GFRNONAA >60 09/03/2015 1542   GFRAA >60 09/03/2015 1542    INo results found for: SPEP, UPEP  Lab Results    Component Value Date   WBC 6.4 01/09/2016   NEUTROABS 3.6 01/09/2016   HGB 10.5 (L) 01/09/2016   HCT 33.4 (L) 01/09/2016   MCV 92.0 01/09/2016   PLT 274 01/09/2016      Chemistry      Component Value Date/Time   NA 139 01/09/2016 0947   K 3.8 01/09/2016 0947   CL 102 09/03/2015 1542   CO2 28 01/09/2016 0947   BUN 10.4 01/09/2016 0947   CREATININE 0.8 01/09/2016 0947      Component Value Date/Time   CALCIUM 9.5 01/09/2016 0947   ALKPHOS 186 (H) 01/09/2016 0947   AST 17 01/09/2016 0947   ALT 19 01/09/2016 0947   BILITOT 0.34 01/09/2016 0947       No results found for: LABCA2  No components found for: [id number]  No results for input(s): INR in the last 168 hours.  Urinalysis No results found for: COLORURINE, APPEARANCEUR, LABSPEC, PHURINE, GLUCOSEU, HGBUR, BILIRUBINUR, KETONESUR, PROTEINUR, UROBILINOGEN, NITRITE, LEUKOCYTESUR  STUDIES: No results found.  ELIGIBLE FOR AVAILABLE RESEARCH PROTOCOL: PALLAS; not elegible for ALLIANCE as did not receive 4 cycles of anthracycline  ASSESSMENT: 70 y.o. [place] woman status Parrish left breast upper outer quadrant and left axillary lymph node biopsy 08/13/2015 both positive for an invasive ductal carcinoma, grade 2 or 3, triple positive, with an MIB-1 of 50%  (1) neoadjuvant chemotherapy consisting of carboplatin, docetaxel, trastuzumab and pertuzumab given every 21 days Started 09/11/2015  (a) treatment changed to Abraxane and trastuzumab with cycle 2 because of side effects after cycle 1  (b) Abraxane discontinued after 3d dose (10/17/2015) because of peripheral neuropathy  (c) cyclophosphamide, methotrexate and fluorouracil (CMF) started 11/07/2015, 4 cycles, last dose 01/09/2016   (2) trastuzumab will be continued to complete a year  (a) most recent echocardiogram 09/09/2015 showed an ejection fraction of 60-65%   (3) definitive surgery to follow with TAD vs. consideration of the Alliance trial  (4) adjuvant radiation  to follow as appropriate  (5) anti-estrogens to follow at the completion of local treatment, with consideration of the PALLAS trial  PLAN: Kristine Parrish will complete the chemotherapy portion of her treatment today. She has done a terrific job of putting up with  side effects and complications and has managed to get to this point without any major problems.  Of course we are continuing Herceptin. She will receive that next week and then every 3 weeks through June of next year.  I have set her up for a repeat breast MRI November  13 or so. She likely will see her surgeon shortly thereafter to plan her surgery.  I'm going to see her next week just to make sure that her counts  Are up and that she doesn't have any remaining issues or questions regarding her chemotherapy.  She knows to call for any problems that may develop before that visit.  Kristine Cruel, MD   01/13/2016 9:03 AM Medical Oncology and Hematology [place]

## 2016-01-16 ENCOUNTER — Encounter: Payer: Self-pay | Admitting: *Deleted

## 2016-01-16 ENCOUNTER — Ambulatory Visit (HOSPITAL_BASED_OUTPATIENT_CLINIC_OR_DEPARTMENT_OTHER): Payer: BLUE CROSS/BLUE SHIELD

## 2016-01-16 ENCOUNTER — Ambulatory Visit (HOSPITAL_BASED_OUTPATIENT_CLINIC_OR_DEPARTMENT_OTHER): Payer: BLUE CROSS/BLUE SHIELD | Admitting: Oncology

## 2016-01-16 ENCOUNTER — Other Ambulatory Visit (HOSPITAL_BASED_OUTPATIENT_CLINIC_OR_DEPARTMENT_OTHER): Payer: BLUE CROSS/BLUE SHIELD

## 2016-01-16 VITALS — BP 139/70 | HR 98 | Temp 98.0°F | Resp 18 | Ht 65.0 in | Wt 165.9 lb

## 2016-01-16 DIAGNOSIS — Z170 Estrogen receptor positive status [ER+]: Secondary | ICD-10-CM

## 2016-01-16 DIAGNOSIS — C50412 Malignant neoplasm of upper-outer quadrant of left female breast: Secondary | ICD-10-CM

## 2016-01-16 DIAGNOSIS — C773 Secondary and unspecified malignant neoplasm of axilla and upper limb lymph nodes: Secondary | ICD-10-CM | POA: Diagnosis not present

## 2016-01-16 DIAGNOSIS — Z5112 Encounter for antineoplastic immunotherapy: Secondary | ICD-10-CM | POA: Diagnosis not present

## 2016-01-16 LAB — CBC WITH DIFFERENTIAL/PLATELET
BASO%: 0.6 % (ref 0.0–2.0)
Basophils Absolute: 0 10*3/uL (ref 0.0–0.1)
EOS%: 0.6 % (ref 0.0–7.0)
Eosinophils Absolute: 0 10*3/uL (ref 0.0–0.5)
HCT: 31.1 % — ABNORMAL LOW (ref 34.8–46.6)
HGB: 10 g/dL — ABNORMAL LOW (ref 11.6–15.9)
LYMPH%: 22.3 % (ref 14.0–49.7)
MCH: 28.9 pg (ref 25.1–34.0)
MCHC: 32.2 g/dL (ref 31.5–36.0)
MCV: 89.9 fL (ref 79.5–101.0)
MONO#: 0 10*3/uL — ABNORMAL LOW (ref 0.1–0.9)
MONO%: 0.9 % (ref 0.0–14.0)
NEUT#: 2.6 10*3/uL (ref 1.5–6.5)
NEUT%: 75.6 % (ref 38.4–76.8)
Platelets: 110 10*3/uL — ABNORMAL LOW (ref 145–400)
RBC: 3.46 10*6/uL — ABNORMAL LOW (ref 3.70–5.45)
RDW: 16 % — ABNORMAL HIGH (ref 11.2–14.5)
WBC: 3.5 10*3/uL — ABNORMAL LOW (ref 3.9–10.3)
lymph#: 0.8 10*3/uL — ABNORMAL LOW (ref 0.9–3.3)

## 2016-01-16 LAB — COMPREHENSIVE METABOLIC PANEL
ALT: 16 U/L (ref 0–55)
AST: 13 U/L (ref 5–34)
Albumin: 3.3 g/dL — ABNORMAL LOW (ref 3.5–5.0)
Alkaline Phosphatase: 167 U/L — ABNORMAL HIGH (ref 40–150)
Anion Gap: 13 mEq/L — ABNORMAL HIGH (ref 3–11)
BUN: 11.8 mg/dL (ref 7.0–26.0)
CO2: 22 mEq/L (ref 22–29)
Calcium: 9.3 mg/dL (ref 8.4–10.4)
Chloride: 103 mEq/L (ref 98–109)
Creatinine: 0.8 mg/dL (ref 0.6–1.1)
EGFR: 71 mL/min/{1.73_m2} — ABNORMAL LOW (ref >90)
Glucose: 241 mg/dl — ABNORMAL HIGH (ref 70–140)
Potassium: 4.1 mEq/L (ref 3.5–5.1)
Sodium: 138 mEq/L (ref 136–145)
Total Bilirubin: 0.56 mg/dL (ref 0.20–1.20)
Total Protein: 6.8 g/dL (ref 6.4–8.3)

## 2016-01-16 MED ORDER — ACETAMINOPHEN 325 MG PO TABS
650.0000 mg | ORAL_TABLET | Freq: Once | ORAL | Status: AC
  Administered 2016-01-16: 650 mg via ORAL

## 2016-01-16 MED ORDER — DIPHENHYDRAMINE HCL 25 MG PO CAPS
ORAL_CAPSULE | ORAL | Status: AC
  Filled 2016-01-16: qty 1

## 2016-01-16 MED ORDER — SODIUM CHLORIDE 0.9 % IV SOLN
6.0000 mg/kg | Freq: Once | INTRAVENOUS | Status: AC
  Administered 2016-01-16: 462 mg via INTRAVENOUS
  Filled 2016-01-16: qty 22

## 2016-01-16 MED ORDER — HEPARIN SOD (PORK) LOCK FLUSH 100 UNIT/ML IV SOLN
500.0000 [IU] | Freq: Once | INTRAVENOUS | Status: AC | PRN
  Administered 2016-01-16: 500 [IU]
  Filled 2016-01-16: qty 5

## 2016-01-16 MED ORDER — ACETAMINOPHEN 325 MG PO TABS
ORAL_TABLET | ORAL | Status: AC
  Filled 2016-01-16: qty 2

## 2016-01-16 MED ORDER — SODIUM CHLORIDE 0.9% FLUSH
10.0000 mL | INTRAVENOUS | Status: DC | PRN
  Administered 2016-01-16: 10 mL
  Filled 2016-01-16: qty 10

## 2016-01-16 MED ORDER — DIPHENHYDRAMINE HCL 25 MG PO CAPS
25.0000 mg | ORAL_CAPSULE | Freq: Once | ORAL | Status: AC
  Administered 2016-01-16: 25 mg via ORAL

## 2016-01-16 MED ORDER — SODIUM CHLORIDE 0.9 % IV SOLN
Freq: Once | INTRAVENOUS | Status: AC
  Administered 2016-01-16: 09:00:00 via INTRAVENOUS

## 2016-01-16 NOTE — Patient Instructions (Signed)
[facility] Discharge Instructions for Patients Receiving Chemotherapy  Today you received the following chemotherapy agents: Herceptin   To help prevent nausea and vomiting after your treatment, we encourage you to take your nausea medication as directed.    If you develop nausea and vomiting that is not controlled by your nausea medication, call the clinic.   BELOW ARE SYMPTOMS THAT SHOULD BE REPORTED IMMEDIATELY:  *FEVER GREATER THAN 100.5 F  *CHILLS WITH OR WITHOUT FEVER  NAUSEA AND VOMITING THAT IS NOT CONTROLLED WITH YOUR NAUSEA MEDICATION  *UNUSUAL SHORTNESS OF BREATH  *UNUSUAL BRUISING OR BLEEDING  TENDERNESS IN MOUTH AND THROAT WITH OR WITHOUT PRESENCE OF ULCERS  *URINARY PROBLEMS  *BOWEL PROBLEMS  UNUSUAL RASH Items with * indicate a potential emergency and should be followed up as soon as possible.  Feel free to call the clinic you have any questions or concerns. The clinic phone number is [phone].  Please show the CHEMO ALERT CARD at check-in to the Emergency Department and triage nurse.   

## 2016-01-16 NOTE — Progress Notes (Signed)
[facility]  [phone] [phone]     ID: Kristine Parrish DOB: 07/20/1945  MR#: [id number]  ZDG#:[id number]  Patient Care Team: Orpah Melter, MD as PCP - General (Family Medicine) Autumn Messing III, MD as Consulting Physician (General Surgery) Chauncey Cruel, MD as Consulting Physician (Oncology) Eppie Gibson, MD as Attending Physician (Radiation Oncology) Murvin Donning, MD (Dermatology) Laurence Spates, MD as Consulting Physician (Gastroenterology) Rosemary Holms, DPM as Consulting Physician (Podiatry) Viona Gilmore Evette Cristal, MD as Consulting Physician (Obstetrics and Gynecology) Larey Dresser, MD as Consulting Physician (Cardiology) Benson Norway, RN as Registered Nurse PCP: Orpah Melter, MD OTHER MD:  CHIEF COMPLAINT: Estrogen receptor positive breast cancer  CURRENT TREATMENT: anti-HER-2 immunotherapy; definitive surgery pending   BREAST CANCER HISTORY: From the original intake note  Kristine Parrish herself noted a change in her left breast and brought it to the attention of Dr. Nori Riis, who set her up for bilateral diagnostic mammography with tomography and left breast ultrasonography at the [facility] 08/09/2015. The breast density was category D. In the area of palpable concern there was a mass with irregular margins and architectural distortion, measuring approximately 3 cm. This was palpable in the upper outer quadrant near the nipple. The ultrasound confirmed an irregular hypoechoic mass approximately 3 cm from the nipple at the 10:00 position measuring 2.7 cm. Ultrasound of the left axilla found a single level I left axillary node with focal cortical thickening.  On 08/13/2015 the patient underwent biopsy of the left breast mass and the suspicious left axillary lymph node, both showing invasive ductal carcinoma, grade 2 or 3, estrogen receptor 100% positive, progesterone receptor 100% positive, both with strong staining intensity, with an MIB-1 of 50%,  and HER-2 amplification, the signals ratio being 2.2 to and the number per cell 3.55  The patient's subsequent history is as detailed below  INTERVAL HISTORY: Kristine Parrish returns today for follow-up of her HER-2/neu positive breast cancer. She has completed her chemotherapy treatments and is waiting on repeat breast MRI and definitive surgery. Today she starts her trastuzumab only treatments, which will continue for an additional 9 months.  REVIEW OF SYSTEMS: Kristine Parrish continues to improve. She still has mild nausea, which she treats usually at bedtime with the Compazine. This also makes her sleepy. She says she has a good appetite but her sense of taste is still or altered and she is going for fruits and other acidic and frequently cold meals. Her blood sugars are not well controlled likely because she is not exercising regularly although she is starting a walking program. Sometimes her vision is a little blurred and she understands this is on accommodation problem related to her chemotherapy premeds and likely will resolve over time(usually after about 6 months). She still feels a little bit short of breath particularly when walking upstairs. Aside from these issues a detailed review of systems today was noncontributory  PAST MEDICAL HISTORY: Past Medical History:  Diagnosis Date  . Breast cancer of upper-outer quadrant of left female breast ([place]) 08/15/2015  . Colon polyps   . GERD (gastroesophageal reflux disease)   . Hiatal hernia   . History of bronchitis   . Hypertension   . Peptic ulcer disease   . Peripheral neuropathy ([place])   . Type 2 diabetes mellitus ([place])     PAST SURGICAL HISTORY: Past Surgical History:  Procedure Laterality Date  . APPENDECTOMY  1974  . cataract surgery  04/2012  . CHOLECYSTECTOMY  1996  . COLONOSCOPY    .  FOOT SURGERY     left bunion removed  . nerve removed from left foot  2000  . PORTACATH PLACEMENT N/A 09/05/2015   Procedure: INSERTION PORT-A-CATH;  Surgeon:  Autumn Messing III, MD;  Location: [facility];  Service: General;  Laterality: N/A;  . TUBAL LIGATION    . VAGINAL HYSTERECTOMY  1995    FAMILY HISTORY Family History  Problem Relation Age of Onset  . Diabetes Mother   . Hypertension Mother   . Alcoholism Maternal Grandfather   . Congestive Heart Failure Maternal Grandmother   The patient has little information about her father and is not sure of his cause of death or age at death. The patient's mother died at age 59. The patient had no siblings. She was raised by her grandmother. She is not aware of any breast or ovarian cancer history in the family   GYNECOLOGIC HISTORY:  No LMP recorded. Patient has had a hysterectomy.  menarche age 36, first live birth age 70. The patient is GX P2. She underwent hysterectomy with bilateral salpingo-oophorectomy in 1994, and has been on estrogen replacement since that time, discontinued June 2017.   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 Daughter Kristine Parrish lives in [place] and works as a Teacher, music for the  [employer]. Son Kristine Parrish lives in [place] where he is Secondary school teacher. The patient had one grandchild. She attends [employer]. The patient's daughter [name] is her healthcare power of attorney.     HEALTH MAINTENANCE: Social History  Substance Use Topics  . Smoking status: Never Smoker  . Smokeless tobacco: Never Used  . Alcohol use No     Colonoscopy: 2012/[facility]  PAP:  Bone density: 2015/"normal" at Dr. Verlon Au  Lipid panel:  No Known Allergies  Current Outpatient Prescriptions  Medication Sig Dispense Refill  . aspirin 81 MG tablet Take 81 mg by mouth daily.    Marland Kitchen gabapentin (NEURONTIN) 600 MG tablet TAKE ONE TABLET BY MOUTH THREE TIMES DAILY  90 tablet 5  . hydrochlorothiazide (MICROZIDE) 12.5 MG capsule Take  12.5 mg by mouth daily.    Marland Kitchen lovastatin (MEVACOR) 20 MG tablet Take 20 mg by mouth at bedtime.    . metFORMIN (GLUCOPHAGE) 500 MG tablet Take by mouth 2 (two) times daily with a meal.    . Multiple Vitamin (MULTIVITAMIN) tablet Take 1 tablet by mouth daily.    Marland Kitchen omeprazole (PRILOSEC) 20 MG capsule Take 20 mg by mouth daily.    . potassium chloride (MICRO-K) 10 MEQ CR capsule Take 2 capsules (20 mEq total) by mouth daily. 60 capsule 0  . Probiotic Product (PROBIOTIC DAILY PO) Take by mouth. Reported on 08/21/2015    . prochlorperazine (COMPAZINE) 10 MG tablet Take 1 tablet (10 mg total) by mouth every 6 (six) hours as needed for nausea or vomiting. 30 tablet 0   No current facility-administered medications for this visit.     OBJECTIVE: Middle-aged white woman Who appears stated age  70:   01/16/16 0837  BP: 139/70  Pulse: 98  Resp: 18  Temp: 98 F (36.7 C)     Body mass index is 27.61 kg/m.    ECOG FS:1 - Symptomatic but completely ambulatory  Sclerae unicteric, EOMs intact Oropharynx clear and moist No cervical or supraclavicular adenopathy Lungs no rales or rhonchi Heart regular  rate and rhythm Abd soft, nontender, positive bowel sounds MSK no focal spinal tenderness, no upper extremity lymphedema Neuro: nonfocal, well oriented, appropriate affect Breasts: Both breasts are entirely unremarkable and specifically I do not palpate a mass in the left breast. Both axillae are benign.   LAB RESULTS:  CMP     Component Value Date/Time   NA 139 01/09/2016 0947   K 3.8 01/09/2016 0947   CL 102 09/03/2015 1542   CO2 28 01/09/2016 0947   GLUCOSE 187 (H) 01/09/2016 0947   BUN 10.4 01/09/2016 0947   CREATININE 0.8 01/09/2016 0947   CALCIUM 9.5 01/09/2016 0947   PROT 6.7 01/09/2016 0947   ALBUMIN 3.2 (L) 01/09/2016 0947   AST 17 01/09/2016 0947   ALT 19 01/09/2016 0947   ALKPHOS 186 (H) 01/09/2016 0947   BILITOT 0.34 01/09/2016 0947   GFRNONAA >60 09/03/2015 1542   GFRAA  >60 09/03/2015 1542    INo results found for: SPEP, UPEP  Lab Results  Component Value Date   WBC 3.5 (L) 01/16/2016   NEUTROABS 2.6 01/16/2016   HGB 10.0 (L) 01/16/2016   HCT 31.1 (L) 01/16/2016   MCV 89.9 01/16/2016   PLT 110 (L) 01/16/2016      Chemistry      Component Value Date/Time   NA 139 01/09/2016 0947   K 3.8 01/09/2016 0947   CL 102 09/03/2015 1542   CO2 28 01/09/2016 0947   BUN 10.4 01/09/2016 0947   CREATININE 0.8 01/09/2016 0947      Component Value Date/Time   CALCIUM 9.5 01/09/2016 0947   ALKPHOS 186 (H) 01/09/2016 0947   AST 17 01/09/2016 0947   ALT 19 01/09/2016 0947   BILITOT 0.34 01/09/2016 0947       No results found for: LABCA2  No components found for: [id number]  No results for input(s): INR in the last 168 hours.  Urinalysis No results found for: COLORURINE, APPEARANCEUR, LABSPEC, PHURINE, GLUCOSEU, HGBUR, BILIRUBINUR, KETONESUR, PROTEINUR, UROBILINOGEN, NITRITE, LEUKOCYTESUR  STUDIES: No results found.  ELIGIBLE FOR AVAILABLE RESEARCH PROTOCOL: PALLAS  ASSESSMENT: 70 y.o. [place] woman status post left breast upper outer quadrant and left axillary lymph node biopsy 08/13/2015 both positive for an invasive ductal carcinoma, grade 2 or 3, triple positive, with an MIB-1 of 50%  (1) neoadjuvant chemotherapy consisting of carboplatin, docetaxel, trastuzumab and pertuzumab given every 21 days Started 09/11/2015  (a) treatment changed to Abraxane and trastuzumab with cycle 2 because of side effects after cycle 1  (b) Abraxane discontinued after 3d dose (10/17/2015) because of peripheral neuropathy  (c) cyclophosphamide, methotrexate and fluorouracil (CMF) started 11/07/2015, 4 cycles, last dose 01/09/2016   (2) trastuzumab will be continued to complete a year (Through June 2018).  (a) most recent echocardiogram 12/31/2015 showed an ejection fraction of 55-60%   (3) definitive surgery to follow with TAD (does not meet criteria for the  ALLIANCE trial)  (4) adjuvant radiation to follow as appropriate  (5) anti-estrogens to follow at the completion of local treatment, with consideration of the PALLAS trial  PLAN: Zuriah continues to recover uneventfully from her chemotherapy and is starting her series of Herceptin only treatments today. These will be repeated every 21 days until she completes a year, which is going to take [place] through June 2018  She is being scheduled for a breast MRI within the next week and will be seeing her surgeon shortly thereafter to discuss definitive surgery.  She is going to see me again 02/27/2016. We  should have the surgical pathology by then and likely she will already be undergoing adjuvant radiation  I suggested she enroll in the next finding your normal group, which is scheduled for early February. She may also be able to participate in the PALLAS trial. She will be due for her next echocardiogram mid-January.  Esraa will call with any problems that may develop before her next visit here.  Chauncey Cruel, MD   01/16/2016 8:53 AM Medical Oncology and Hematology [facility]

## 2016-01-25 ENCOUNTER — Ambulatory Visit
Admission: RE | Admit: 2016-01-25 | Discharge: 2016-01-25 | Disposition: A | Discharge status: Home / Self Care | Payer: BLUE CROSS/BLUE SHIELD | Source: Ambulatory Visit | Attending: Oncology | Admitting: Oncology

## 2016-01-25 DIAGNOSIS — D0512 Intraductal carcinoma in situ of left breast: Secondary | ICD-10-CM | POA: Diagnosis not present

## 2016-01-25 DIAGNOSIS — C50412 Malignant neoplasm of upper-outer quadrant of left female breast: Secondary | ICD-10-CM

## 2016-01-25 MED ORDER — GADOBENATE DIMEGLUMINE 529 MG/ML IV SOLN
15.0000 mL | Freq: Once | INTRAVENOUS | Status: AC | PRN
  Administered 2016-01-25: 15 mL via INTRAVENOUS

## 2016-01-28 ENCOUNTER — Ambulatory Visit: Payer: Self-pay | Admitting: General Surgery

## 2016-01-28 ENCOUNTER — Other Ambulatory Visit: Payer: Self-pay | Admitting: General Surgery

## 2016-01-28 DIAGNOSIS — C50412 Malignant neoplasm of upper-outer quadrant of left female breast: Secondary | ICD-10-CM | POA: Diagnosis not present

## 2016-01-28 DIAGNOSIS — Z170 Estrogen receptor positive status [ER+]: Secondary | ICD-10-CM

## 2016-01-28 DIAGNOSIS — Z853 Personal history of malignant neoplasm of breast: Secondary | ICD-10-CM

## 2016-01-31 ENCOUNTER — Other Ambulatory Visit: Payer: BLUE CROSS/BLUE SHIELD

## 2016-01-31 ENCOUNTER — Ambulatory Visit: Payer: BLUE CROSS/BLUE SHIELD

## 2016-02-06 ENCOUNTER — Ambulatory Visit (HOSPITAL_BASED_OUTPATIENT_CLINIC_OR_DEPARTMENT_OTHER): Payer: BLUE CROSS/BLUE SHIELD

## 2016-02-06 ENCOUNTER — Other Ambulatory Visit (HOSPITAL_BASED_OUTPATIENT_CLINIC_OR_DEPARTMENT_OTHER): Payer: BLUE CROSS/BLUE SHIELD

## 2016-02-06 VITALS — BP 145/73 | HR 80 | Temp 97.8°F | Resp 18

## 2016-02-06 DIAGNOSIS — C50412 Malignant neoplasm of upper-outer quadrant of left female breast: Secondary | ICD-10-CM

## 2016-02-06 DIAGNOSIS — Z170 Estrogen receptor positive status [ER+]: Secondary | ICD-10-CM | POA: Diagnosis not present

## 2016-02-06 LAB — COMPREHENSIVE METABOLIC PANEL
ALT: 18 U/L (ref 0–55)
AST: 15 U/L (ref 5–34)
Albumin: 3.2 g/dL — ABNORMAL LOW (ref 3.5–5.0)
Alkaline Phosphatase: 182 U/L — ABNORMAL HIGH (ref 40–150)
Anion Gap: 10 mEq/L (ref 3–11)
BUN: 8.3 mg/dL (ref 7.0–26.0)
CO2: 26 mEq/L (ref 22–29)
Calcium: 9.6 mg/dL (ref 8.4–10.4)
Chloride: 105 mEq/L (ref 98–109)
Creatinine: 0.8 mg/dL (ref 0.6–1.1)
EGFR: 73 mL/min/{1.73_m2} — ABNORMAL LOW (ref >90)
Glucose: 183 mg/dl — ABNORMAL HIGH (ref 70–140)
Potassium: 4.1 mEq/L (ref 3.5–5.1)
Sodium: 141 mEq/L (ref 136–145)
Total Bilirubin: 0.31 mg/dL (ref 0.20–1.20)
Total Protein: 6.8 g/dL (ref 6.4–8.3)

## 2016-02-06 LAB — CBC WITH DIFFERENTIAL/PLATELET
BASO%: 0.4 % (ref 0.0–2.0)
Basophils Absolute: 0 10*3/uL (ref 0.0–0.1)
EOS%: 1 % (ref 0.0–7.0)
Eosinophils Absolute: 0.1 10*3/uL (ref 0.0–0.5)
HCT: 33.5 % — ABNORMAL LOW (ref 34.8–46.6)
HGB: 10.4 g/dL — ABNORMAL LOW (ref 11.6–15.9)
LYMPH%: 21.6 % (ref 14.0–49.7)
MCH: 28.4 pg (ref 25.1–34.0)
MCHC: 31 g/dL — ABNORMAL LOW (ref 31.5–36.0)
MCV: 91.5 fL (ref 79.5–101.0)
MONO#: 0.6 10*3/uL (ref 0.1–0.9)
MONO%: 7.4 % (ref 0.0–14.0)
NEUT#: 5.8 10*3/uL (ref 1.5–6.5)
NEUT%: 69.6 % (ref 38.4–76.8)
Platelets: 176 10*3/uL (ref 145–400)
RBC: 3.66 10*6/uL — ABNORMAL LOW (ref 3.70–5.45)
RDW: 16.7 % — ABNORMAL HIGH (ref 11.2–14.5)
WBC: 8.3 10*3/uL (ref 3.9–10.3)
lymph#: 1.8 10*3/uL (ref 0.9–3.3)

## 2016-02-06 MED ORDER — ACETAMINOPHEN 325 MG PO TABS
ORAL_TABLET | ORAL | Status: AC
  Filled 2016-02-06: qty 2

## 2016-02-06 MED ORDER — SODIUM CHLORIDE 0.9 % IV SOLN
Freq: Once | INTRAVENOUS | Status: AC
  Administered 2016-02-06: 10:00:00 via INTRAVENOUS

## 2016-02-06 MED ORDER — DIPHENHYDRAMINE HCL 25 MG PO CAPS
ORAL_CAPSULE | ORAL | Status: AC
  Filled 2016-02-06: qty 1

## 2016-02-06 MED ORDER — DIPHENHYDRAMINE HCL 25 MG PO CAPS
25.0000 mg | ORAL_CAPSULE | Freq: Once | ORAL | Status: AC
  Administered 2016-02-06: 25 mg via ORAL

## 2016-02-06 MED ORDER — TRASTUZUMAB CHEMO 150 MG IV SOLR
6.0000 mg/kg | Freq: Once | INTRAVENOUS | Status: AC
  Administered 2016-02-06: 462 mg via INTRAVENOUS
  Filled 2016-02-06: qty 22

## 2016-02-06 MED ORDER — SODIUM CHLORIDE 0.9% FLUSH
10.0000 mL | INTRAVENOUS | Status: DC | PRN
  Administered 2016-02-06: 10 mL
  Filled 2016-02-06: qty 10

## 2016-02-06 MED ORDER — HEPARIN SOD (PORK) LOCK FLUSH 100 UNIT/ML IV SOLN
500.0000 [IU] | Freq: Once | INTRAVENOUS | Status: AC | PRN
  Administered 2016-02-06: 500 [IU]
  Filled 2016-02-06: qty 5

## 2016-02-06 MED ORDER — ACETAMINOPHEN 325 MG PO TABS
650.0000 mg | ORAL_TABLET | Freq: Once | ORAL | Status: AC
  Administered 2016-02-06: 650 mg via ORAL

## 2016-02-06 NOTE — Patient Instructions (Signed)
[facility] Discharge Instructions for Patients Receiving Chemotherapy  Today you received the following chemotherapy agents: Herceptin   To help prevent nausea and vomiting after your treatment, we encourage you to take your nausea medication as directed.    If you develop nausea and vomiting that is not controlled by your nausea medication, call the clinic.   BELOW ARE SYMPTOMS THAT SHOULD BE REPORTED IMMEDIATELY:  *FEVER GREATER THAN 100.5 F  *CHILLS WITH OR WITHOUT FEVER  NAUSEA AND VOMITING THAT IS NOT CONTROLLED WITH YOUR NAUSEA MEDICATION  *UNUSUAL SHORTNESS OF BREATH  *UNUSUAL BRUISING OR BLEEDING  TENDERNESS IN MOUTH AND THROAT WITH OR WITHOUT PRESENCE OF ULCERS  *URINARY PROBLEMS  *BOWEL PROBLEMS  UNUSUAL RASH Items with * indicate a potential emergency and should be followed up as soon as possible.  Feel free to call the clinic you have any questions or concerns. The clinic phone number is [phone].  Please show the CHEMO ALERT CARD at check-in to the Emergency Department and triage nurse.   

## 2016-02-12 ENCOUNTER — Ambulatory Visit
Admission: RE | Admit: 2016-02-12 | Discharge: 2016-02-12 | Disposition: A | Discharge status: Home / Self Care | Payer: BLUE CROSS/BLUE SHIELD | Source: Ambulatory Visit | Attending: General Surgery | Admitting: General Surgery

## 2016-02-12 ENCOUNTER — Other Ambulatory Visit: Payer: Self-pay | Admitting: General Surgery

## 2016-02-12 DIAGNOSIS — C50412 Malignant neoplasm of upper-outer quadrant of left female breast: Secondary | ICD-10-CM

## 2016-02-12 DIAGNOSIS — N6320 Unspecified lump in the left breast, unspecified quadrant: Secondary | ICD-10-CM | POA: Diagnosis not present

## 2016-02-12 DIAGNOSIS — Z170 Estrogen receptor positive status [ER+]: Secondary | ICD-10-CM

## 2016-02-12 DIAGNOSIS — R590 Localized enlarged lymph nodes: Secondary | ICD-10-CM | POA: Diagnosis not present

## 2016-02-13 ENCOUNTER — Encounter (HOSPITAL_COMMUNITY): Payer: Self-pay | Admitting: *Deleted

## 2016-02-14 ENCOUNTER — Ambulatory Visit (HOSPITAL_COMMUNITY)
Admission: RE | Admit: 2016-02-14 | Discharge: 2016-02-14 | Disposition: A | Discharge status: Home / Self Care | Payer: BLUE CROSS/BLUE SHIELD | Source: Ambulatory Visit | Attending: General Surgery | Admitting: General Surgery

## 2016-02-14 ENCOUNTER — Ambulatory Visit (HOSPITAL_COMMUNITY): Payer: BLUE CROSS/BLUE SHIELD | Admitting: Certified Registered Nurse Anesthetist

## 2016-02-14 ENCOUNTER — Ambulatory Visit
Admission: RE | Admit: 2016-02-14 | Discharge: 2016-02-14 | Disposition: A | Discharge status: Home / Self Care | Payer: BLUE CROSS/BLUE SHIELD | Source: Ambulatory Visit | Attending: General Surgery | Admitting: General Surgery

## 2016-02-14 ENCOUNTER — Encounter (HOSPITAL_COMMUNITY)
Admission: RE | Disposition: A | Discharge status: Home / Self Care | Payer: Self-pay | Source: Ambulatory Visit | Attending: General Surgery

## 2016-02-14 ENCOUNTER — Encounter (HOSPITAL_COMMUNITY): Payer: Self-pay | Admitting: Surgery

## 2016-02-14 DIAGNOSIS — C50412 Malignant neoplasm of upper-outer quadrant of left female breast: Secondary | ICD-10-CM

## 2016-02-14 DIAGNOSIS — R590 Localized enlarged lymph nodes: Secondary | ICD-10-CM | POA: Diagnosis not present

## 2016-02-14 DIAGNOSIS — M1990 Unspecified osteoarthritis, unspecified site: Secondary | ICD-10-CM | POA: Insufficient documentation

## 2016-02-14 DIAGNOSIS — K279 Peptic ulcer, site unspecified, unspecified as acute or chronic, without hemorrhage or perforation: Secondary | ICD-10-CM | POA: Diagnosis not present

## 2016-02-14 DIAGNOSIS — I10 Essential (primary) hypertension: Secondary | ICD-10-CM | POA: Diagnosis not present

## 2016-02-14 DIAGNOSIS — K219 Gastro-esophageal reflux disease without esophagitis: Secondary | ICD-10-CM | POA: Diagnosis not present

## 2016-02-14 DIAGNOSIS — Z7982 Long term (current) use of aspirin: Secondary | ICD-10-CM | POA: Insufficient documentation

## 2016-02-14 DIAGNOSIS — Z170 Estrogen receptor positive status [ER+]: Secondary | ICD-10-CM | POA: Insufficient documentation

## 2016-02-14 DIAGNOSIS — Z853 Personal history of malignant neoplasm of breast: Secondary | ICD-10-CM

## 2016-02-14 DIAGNOSIS — R739 Hyperglycemia, unspecified: Secondary | ICD-10-CM | POA: Diagnosis not present

## 2016-02-14 DIAGNOSIS — Z7984 Long term (current) use of oral hypoglycemic drugs: Secondary | ICD-10-CM | POA: Insufficient documentation

## 2016-02-14 DIAGNOSIS — E119 Type 2 diabetes mellitus without complications: Secondary | ICD-10-CM | POA: Diagnosis not present

## 2016-02-14 DIAGNOSIS — T7840XA Allergy, unspecified, initial encounter: Secondary | ICD-10-CM | POA: Diagnosis not present

## 2016-02-14 DIAGNOSIS — G8918 Other acute postprocedural pain: Secondary | ICD-10-CM | POA: Diagnosis not present

## 2016-02-14 DIAGNOSIS — Z9221 Personal history of antineoplastic chemotherapy: Secondary | ICD-10-CM | POA: Insufficient documentation

## 2016-02-14 DIAGNOSIS — C773 Secondary and unspecified malignant neoplasm of axilla and upper limb lymph nodes: Secondary | ICD-10-CM | POA: Diagnosis not present

## 2016-02-14 DIAGNOSIS — K449 Diaphragmatic hernia without obstruction or gangrene: Secondary | ICD-10-CM | POA: Diagnosis not present

## 2016-02-14 DIAGNOSIS — C50912 Malignant neoplasm of unspecified site of left female breast: Secondary | ICD-10-CM | POA: Diagnosis not present

## 2016-02-14 HISTORY — DX: Unspecified osteoarthritis, unspecified site: M19.90

## 2016-02-14 HISTORY — PX: BREAST LUMPECTOMY: SHX2

## 2016-02-14 HISTORY — PX: RADIOACTIVE SEED GUIDED PARTIAL MASTECTOMY/AXILLARY SENTINEL NODE BIOPSY/AXILLARY NODE DISSECTION: SHX6491

## 2016-02-14 HISTORY — DX: Polyneuropathy, unspecified: G62.9

## 2016-02-14 HISTORY — DX: Dyspnea, unspecified: R06.00

## 2016-02-14 LAB — GLUCOSE, CAPILLARY
Glucose-Capillary: 165 mg/dL — ABNORMAL HIGH (ref 65–99)
Glucose-Capillary: 152 mg/dL — ABNORMAL HIGH (ref 65–99)

## 2016-02-14 SURGERY — RADIOACTIVE SEED GUIDED PARTIAL MASTECTOMY WITH AXILLARY SENTINEL LYMPH NODE BIOPSY AND AXILLARY LYMPH NODE DISSECTION
Anesthesia: Regional | Site: Breast | Laterality: Left

## 2016-02-14 MED ORDER — LIDOCAINE HCL (CARDIAC) 20 MG/ML IV SOLN
INTRAVENOUS | Status: DC | PRN
  Administered 2016-02-14: 40 mg via INTRAVENOUS

## 2016-02-14 MED ORDER — LACTATED RINGERS IV SOLN: INTRAVENOUS | Status: DC

## 2016-02-14 MED ORDER — PROPOFOL 10 MG/ML IV BOLUS
INTRAVENOUS | Status: DC | PRN
  Administered 2016-02-14: 160 mg via INTRAVENOUS

## 2016-02-14 MED ORDER — PHENYLEPHRINE HCL 10 MG/ML IJ SOLN
INTRAMUSCULAR | Status: DC | PRN
  Administered 2016-02-14: 40 ug via INTRAVENOUS
  Administered 2016-02-14 (×2): 80 ug via INTRAVENOUS
  Administered 2016-02-14: 40 ug via INTRAVENOUS
  Administered 2016-02-14 (×2): 80 ug via INTRAVENOUS
  Administered 2016-02-14: 40 ug via INTRAVENOUS

## 2016-02-14 MED ORDER — BUPIVACAINE-EPINEPHRINE (PF) 0.5% -1:200000 IJ SOLN
INTRAMUSCULAR | Status: DC | PRN
  Administered 2016-02-14: 30 mL via PERINEURAL

## 2016-02-14 MED ORDER — BUPIVACAINE-EPINEPHRINE 0.25% -1:200000 IJ SOLN
INTRAMUSCULAR | Status: DC | PRN
  Administered 2016-02-14: 27 mL

## 2016-02-14 MED ORDER — HYDROMORPHONE HCL 1 MG/ML IJ SOLN
0.2500 mg | INTRAMUSCULAR | Status: DC | PRN
  Administered 2016-02-14: 0.5 mg via INTRAVENOUS

## 2016-02-14 MED ORDER — SODIUM CHLORIDE 0.9 % IJ SOLN
INTRAVENOUS | Status: DC | PRN
  Administered 2016-02-14: 5 mL

## 2016-02-14 MED ORDER — CHLORHEXIDINE GLUCONATE CLOTH 2 % EX PADS: 6.0000 | MEDICATED_PAD | Freq: Once | CUTANEOUS | Status: DC

## 2016-02-14 MED ORDER — CEFAZOLIN SODIUM-DEXTROSE 2-4 GM/100ML-% IV SOLN
2.0000 g | INTRAVENOUS | Status: AC
  Administered 2016-02-14: 2 g via INTRAVENOUS

## 2016-02-14 MED ORDER — MIDAZOLAM HCL 2 MG/2ML IJ SOLN
INTRAMUSCULAR | Status: AC
  Filled 2016-02-14: qty 2

## 2016-02-14 MED ORDER — MIDAZOLAM HCL 2 MG/2ML IJ SOLN
INTRAMUSCULAR | Status: AC
  Administered 2016-02-14: 1 mg
  Filled 2016-02-14: qty 2

## 2016-02-14 MED ORDER — 0.9 % SODIUM CHLORIDE (POUR BTL) OPTIME
TOPICAL | Status: DC | PRN
  Administered 2016-02-14: 1000 mL

## 2016-02-14 MED ORDER — METHYLENE BLUE 0.5 % INJ SOLN
INTRAVENOUS | Status: AC
  Filled 2016-02-14: qty 10

## 2016-02-14 MED ORDER — FENTANYL CITRATE (PF) 100 MCG/2ML IJ SOLN
INTRAMUSCULAR | Status: DC | PRN
  Administered 2016-02-14: 25 ug via INTRAVENOUS

## 2016-02-14 MED ORDER — FENTANYL CITRATE (PF) 100 MCG/2ML IJ SOLN
INTRAMUSCULAR | Status: AC
  Filled 2016-02-14: qty 2

## 2016-02-14 MED ORDER — MEPERIDINE HCL 25 MG/ML IJ SOLN: 6.2500 mg | INTRAMUSCULAR | Status: DC | PRN

## 2016-02-14 MED ORDER — BUPIVACAINE-EPINEPHRINE (PF) 0.25% -1:200000 IJ SOLN
INTRAMUSCULAR | Status: AC
  Filled 2016-02-14: qty 30

## 2016-02-14 MED ORDER — DEXAMETHASONE SODIUM PHOSPHATE 10 MG/ML IJ SOLN
INTRAMUSCULAR | Status: DC | PRN
  Administered 2016-02-14: 5 mg via INTRAVENOUS

## 2016-02-14 MED ORDER — PROMETHAZINE HCL 25 MG/ML IJ SOLN: 6.2500 mg | INTRAMUSCULAR | Status: DC | PRN

## 2016-02-14 MED ORDER — SODIUM CHLORIDE 0.9 % IJ SOLN
INTRAMUSCULAR | Status: AC
  Filled 2016-02-14: qty 10

## 2016-02-14 MED ORDER — PHENYLEPHRINE 40 MCG/ML (10ML) SYRINGE FOR IV PUSH (FOR BLOOD PRESSURE SUPPORT)
PREFILLED_SYRINGE | INTRAVENOUS | Status: AC
  Filled 2016-02-14: qty 10

## 2016-02-14 MED ORDER — HYDROCODONE-ACETAMINOPHEN 5-325 MG PO TABS: 1.0000 | ORAL_TABLET | ORAL | 0 refills | Status: DC | PRN

## 2016-02-14 MED ORDER — HYDROMORPHONE HCL 1 MG/ML IJ SOLN
INTRAMUSCULAR | Status: AC
  Filled 2016-02-14: qty 0.5

## 2016-02-14 MED ORDER — TECHNETIUM TC 99M SULFUR COLLOID FILTERED
1.0000 | Freq: Once | INTRAVENOUS | Status: AC | PRN
  Administered 2016-02-14: 1 via INTRADERMAL

## 2016-02-14 MED ORDER — FENTANYL CITRATE (PF) 100 MCG/2ML IJ SOLN
INTRAMUSCULAR | Status: AC
  Administered 2016-02-14: 50 ug
  Filled 2016-02-14: qty 2

## 2016-02-14 MED ORDER — CEFAZOLIN SODIUM-DEXTROSE 2-4 GM/100ML-% IV SOLN
INTRAVENOUS | Status: AC
  Filled 2016-02-14: qty 100

## 2016-02-14 MED ORDER — ONDANSETRON HCL 4 MG/2ML IJ SOLN
INTRAMUSCULAR | Status: DC | PRN
  Administered 2016-02-14 (×2): 4 mg via INTRAVENOUS

## 2016-02-14 MED ORDER — LACTATED RINGERS IV SOLN
INTRAVENOUS | Status: DC
  Administered 2016-02-14 (×2): via INTRAVENOUS

## 2016-02-14 SURGICAL SUPPLY — 43 items
APPLIER CLIP 9.375 MED OPEN (MISCELLANEOUS) ×2
BLADE SURG 15 STRL LF DISP TIS (BLADE) ×1 IMPLANT
BLADE SURG 15 STRL SS (BLADE) ×1
CANISTER SUCTION 2500CC (MISCELLANEOUS) ×2 IMPLANT
CHLORAPREP W/TINT 26ML (MISCELLANEOUS) ×2 IMPLANT
CLIP APPLIE 9.375 MED OPEN (MISCELLANEOUS) ×1 IMPLANT
CONT SPEC 4OZ CLIKSEAL STRL BL (MISCELLANEOUS) ×6 IMPLANT
COVER PROBE W GEL 5X96 (DRAPES) ×2 IMPLANT
COVER SURGICAL LIGHT HANDLE (MISCELLANEOUS) ×2 IMPLANT
DERMABOND ADVANCED (GAUZE/BANDAGES/DRESSINGS) ×1
DERMABOND ADVANCED .7 DNX12 (GAUZE/BANDAGES/DRESSINGS) ×1 IMPLANT
DEVICE DUBIN SPECIMEN MAMMOGRA (MISCELLANEOUS) ×2 IMPLANT
DRAPE CHEST BREAST 15X10 FENES (DRAPES) ×2 IMPLANT
DRAPE UTILITY XL STRL (DRAPES) ×2 IMPLANT
ELECT CAUTERY BLADE 6.4 (BLADE) ×2 IMPLANT
ELECT COATED BLADE 2.86 ST (ELECTRODE) ×2 IMPLANT
ELECT REM PT RETURN 9FT ADLT (ELECTROSURGICAL) ×2
ELECTRODE REM PT RTRN 9FT ADLT (ELECTROSURGICAL) ×1 IMPLANT
GLOVE BIO SURGEON STRL SZ7.5 (GLOVE) ×6 IMPLANT
GLOVE BIOGEL PI IND STRL 8.5 (GLOVE) ×2 IMPLANT
GLOVE BIOGEL PI INDICATOR 8.5 (GLOVE) ×2
GLOVE SURG SS PI 8.0 STRL IVOR (GLOVE) ×4 IMPLANT
GOWN STRL REUS W/ TWL LRG LVL3 (GOWN DISPOSABLE) ×2 IMPLANT
GOWN STRL REUS W/ TWL XL LVL3 (GOWN DISPOSABLE) ×2 IMPLANT
GOWN STRL REUS W/TWL LRG LVL3 (GOWN DISPOSABLE) ×2
GOWN STRL REUS W/TWL XL LVL3 (GOWN DISPOSABLE) ×2
KIT BASIN OR (CUSTOM PROCEDURE TRAY) ×2 IMPLANT
KIT MARKER MARGIN INK (KITS) ×2 IMPLANT
LIGHT WAVEGUIDE WIDE FLAT (MISCELLANEOUS) IMPLANT
NEEDLE FILTER BLUNT 18X 1/2SAF (NEEDLE) ×1
NEEDLE FILTER BLUNT 18X1 1/2 (NEEDLE) ×1 IMPLANT
NEEDLE HYPO 25X1 1.5 SAFETY (NEEDLE) ×2 IMPLANT
NS IRRIG 1000ML POUR BTL (IV SOLUTION) ×2 IMPLANT
PACK GENERAL/GYN (CUSTOM PROCEDURE TRAY) ×2 IMPLANT
SUT ETHILON 2 0 FS 18 (SUTURE) IMPLANT
SUT MNCRL AB 4-0 PS2 18 (SUTURE) ×4 IMPLANT
SUT SILK 2 0 SH (SUTURE) IMPLANT
SUT VIC AB 2-0 SH 27 (SUTURE)
SUT VIC AB 2-0 SH 27XBRD (SUTURE) IMPLANT
SUT VIC AB 3-0 SH 18 (SUTURE) ×4 IMPLANT
SYR BULB 3OZ (MISCELLANEOUS) ×2 IMPLANT
SYR CONTROL 10ML LL (SYRINGE) ×4 IMPLANT
TOWEL OR 17X26 10 PK STRL BLUE (TOWEL DISPOSABLE) ×2 IMPLANT

## 2016-02-14 NOTE — Op Note (Signed)
02/14/2016  12:48 PM  PATIENT:  Kristine Parrish  70 y.o. female  PRE-OPERATIVE DIAGNOSIS:  LEFT BREAST CANCER  POST-OPERATIVE DIAGNOSIS:  LEFT BREAST CANCER  PROCEDURE:  Procedure(s): LEFT BREAST RADIOACTIVE SEED GUIDED LUMPECTOMY WITH LEFT  SENTINEL LYMPH NODE BIOPSY AND LEFT  SEED TARGETED DISSECTION (Left)  SURGEON:  Surgeon(s) and Role:    * Jovita Kussmaul, MD - Primary  PHYSICIAN ASSISTANT:   ASSISTANTS: none   ANESTHESIA:   general  EBL:  Total I/O In: 61 [I.V.:850] Out: 5 [Blood:5]  BLOOD ADMINISTERED:none  DRAINS: none   LOCAL MEDICATIONS USED:  MARCAINE     SPECIMEN:  Source of Specimen:  left breast tissue and sentinel node mapping with targeted node dissection  DISPOSITION OF SPECIMEN:  PATHOLOGY  COUNTS:  YES  TOURNIQUET:  * No tourniquets in log *  DICTATION: .Dragon Dictation   After informed consent was obtained the patient was brought to the operating room and placed in the supine position on the operating room table. After adequate induction of general anesthesia the patient's left chest, breast, and axillary area were prepped with ChloraPrep, allowed to dry, and draped in usual sterile manner. An appropriate timeout was performed. The patient had a locally advanced left breast cancer with a positive axillary lymph node. She underwent neoadjuvant chemotherapy and had a good response. She presents now for lumpectomy and sentinel node mapping with a targeted axillary lymph node dissection. Early in the day the patient underwent injection of 1 mCi of technetium sulfur colloid in the subareolar position on the left. At this point, 2 mL of methylene blue and 3 mL of injectable saline were also injected in the subareolar position on the left. Previously she had an I-125 seed placed in the positive left axillary lymph node as well as an I-125 seed placed in the upper outer quadrant of left breast to mark the area of invasive cancer. Attention was first turned to the  left axilla. The neoprobe was set to technetium. A good signal was identified in the left axilla. A transversely oriented incision was made with a 15 blade knife. The incision was carried through the skin and subcutaneous tissue sharply with electrocautery until the axilla was entered. A [place] retractor was then deployed. The neoprobe was used to direct blunt hemostat dissection to the deep axillary lymph nodes. 2 hot lymph nodes and one palpable lymph node were identified in the deep axillary space. These nodes were excised sharply with electrocautery and the lymphatics were controlled with clips. The node with the largest signal also had a signal for I-125 using the neoprobe. A specimen radiograph was obtained of this node which confirmed the presence of an I-125 seed. These nodes were sent to pathology for further evaluation. No other hot, blue, or palpable lymph nodes were identified in the left axilla. The deep layer of the axilla was then closed with interrupted 3-0 Vicryl stitches. The skin was closed with interrupted 4-0 Monocryl subcuticular stitches. The area was infiltrated with quarter percent Marcaine. Attention was then turned to the left breast. The neoprobe was set to I-125 in the area of radioactivity was readily identified in the upper outer left breast. A curvilinear incision was then made along the edge of the areola in the upper outer quadrant of the left breast with a 15 blade knife. The incision was carried through the skin and subcutaneous tissue sharply with electrocautery. The neoprobe was then used to direct dissection towards the radioactive seed. Once we  approached the radioactive seeds then a circular portion of breast tissue was excised sharply around the radioactive seed while checking the area of radioactivity frequently. Once the specimen was removed it was oriented with the appropriate paint colors. A specimen radiograph was obtained that showed the clip and seed to be near the  center of the specimen. The specimen was then sent to pathology for further evaluation. Hemostasis was achieved using the Bovie electrocautery. The wound was infiltrated with quarter percent Marcaine and irrigated with saline. A cavity was marked with clips. The deep layer of the wound was closed with layers of interrupted 3-0 Vicryl stitches. The skin was then closed with interrupted 4-0 Monocryl subcuticular stitches. Dermabond dressings were applied. The patient tolerated the procedure well. At the end of the case all needle sponge and instrument counts were correct. The patient was then awakened and taken to recovery in stable condition.  PLAN OF CARE: Discharge to home after PACU  PATIENT DISPOSITION:  PACU - hemodynamically stable.   Delay start of Pharmacological VTE agent (>24hrs) due to surgical blood loss or risk of bleeding: not applicable

## 2016-02-14 NOTE — Anesthesia Preprocedure Evaluation (Addendum)
Anesthesia Evaluation  Patient identified by MRN, date of birth, ID band Patient awake    Reviewed: Allergy & Precautions, NPO status , Patient's Chart, lab work & pertinent test results  Airway Mallampati: II  TM Distance: >3 FB Neck ROM: Full    Dental  (+) Dental Advisory Given, Teeth Intact   Pulmonary    breath sounds clear to auscultation       Cardiovascular hypertension, Pt. on medications  Rhythm:Regular Rate:Normal     Neuro/Psych  Neuromuscular disease negative psych ROS   GI/Hepatic Neg liver ROS, hiatal hernia, PUD, GERD  Medicated,  Endo/Other  diabetes, Type 2, Oral Hypoglycemic Agents  Renal/GU negative Renal ROS  negative genitourinary   Musculoskeletal  (+) Arthritis , Osteoarthritis,    Abdominal   Peds negative pediatric ROS (+)  Hematology negative hematology ROS (+)   Anesthesia Other Findings   Reproductive/Obstetrics negative OB ROS                           Lab Results  Component Value Date   WBC 8.3 02/06/2016   HGB 10.4 (L) 02/06/2016   HCT 33.5 (L) 02/06/2016   MCV 91.5 02/06/2016   PLT 176 02/06/2016   Lab Results  Component Value Date   CREATININE 0.8 02/06/2016   BUN 8.3 02/06/2016   NA 141 02/06/2016   K 4.1 02/06/2016   CL 102 09/03/2015   CO2 26 02/06/2016   No results found for: INR, PROTIME  EKG: normal sinus rhythm.  Echo: - Left ventricle: The cavity size was normal. Wall thickness was   normal. Systolic function was normal. The estimated ejection   fraction was in the range of 60% to 65%. Wall motion was normal;   there were no regional wall motion abnormalities. Doppler   parameters are consistent with abnormal left ventricular   relaxation (grade 1 diastolic dysfunction). - Pericardium, extracardiac: A trivial pericardial effusion was   identified.  Anesthesia Physical Anesthesia Plan  ASA: II  Anesthesia Plan: General    Post-op Pain Management: GA combined w/ Regional for post-op pain   Induction: Intravenous  Airway Management Planned: LMA  Additional Equipment:   Intra-op Plan:   Post-operative Plan: Extubation in OR  Informed Consent: I have reviewed the patients History and Physical, chart, labs and discussed the procedure including the risks, benefits and alternatives for the proposed anesthesia with the patient or authorized representative who has indicated his/her understanding and acceptance.   Dental advisory given  Plan Discussed with: CRNA  Anesthesia Plan Comments:         Anesthesia Quick Evaluation

## 2016-02-14 NOTE — Anesthesia Postprocedure Evaluation (Signed)
Anesthesia Post Note  Patient: Kristine Parrish  Procedure(s) Performed: Procedure(s) (LRB): LEFT BREAST RADIOACTIVE SEED GUIDED LUMPECTOMY WITH LEFT  SENTINEL LYMPH NODE BIOPSY AND LEFT  SEED TARGETED DISSECTION (Left)  Patient location during evaluation: PACU Anesthesia Type: General and Regional Level of consciousness: awake and alert Pain management: pain level controlled Vital Signs Assessment: post-procedure vital signs reviewed and stable Respiratory status: spontaneous breathing, nonlabored ventilation, respiratory function stable and patient connected to nasal cannula oxygen Cardiovascular status: blood pressure returned to baseline and stable Postop Assessment: no signs of nausea or vomiting Anesthetic complications: no    Last Vitals:  Vitals:   02/14/16 1315 02/14/16 1321  BP: 126/64 134/69  Pulse: 86 88  Resp: 12 11  Temp:  36.7 C    Last Pain:  Vitals:   02/14/16 1321  TempSrc:   PainSc: 0-No pain                 Effie Berkshire

## 2016-02-14 NOTE — Anesthesia Procedure Notes (Signed)
Procedure Name: LMA Insertion Date/Time: 02/14/2016 11:29 AM Performed by: Merdis Delay Pre-anesthesia Checklist: Patient identified, Emergency Drugs available, Suction available, Patient being monitored and Timeout performed Patient Re-evaluated:Patient Re-evaluated prior to inductionOxygen Delivery Method: Circle system utilized Preoxygenation: Pre-oxygenation with 100% oxygen Intubation Type: IV induction LMA: LMA inserted LMA Size: 4.0 Number of attempts: 1 Placement Confirmation: positive ETCO2,  CO2 detector and breath sounds checked- equal and bilateral Tube secured with: Tape Dental Injury: Teeth and Oropharynx as per pre-operative assessment

## 2016-02-14 NOTE — Interval H&P Note (Signed)
History and Physical Interval Note:  02/14/2016 10:35 AM  Kristine Parrish  has presented today for surgery, with the diagnosis of LEFT BREAST CANCER  The various methods of treatment have been discussed with the patient and family. After consideration of risks, benefits and other options for treatment, the patient has consented to  Procedure(s): LEFT BREAST RADIOACTIVE SEED GUIDED LUMPECTOMY WITH LEFT  SENTINEL LYMPH NODE BIOPSY AND LEFT  SEED TARGETED DISSECTION (Left) as a surgical intervention .  The patient's history has been reviewed, patient examined, no change in status, stable for surgery.  I have reviewed the patient's chart and labs.  Questions were answered to the patient's satisfaction.     TOTH III,Lyric Hoar S

## 2016-02-14 NOTE — Anesthesia Procedure Notes (Signed)
Anesthesia Regional Block:  Pectoralis block  Pre-Anesthetic Checklist: ,, timeout performed, Correct Patient, Correct Site, Correct Laterality, Correct Procedure, Correct Position, site marked, Risks and benefits discussed,  Surgical consent,  Pre-op evaluation,  At surgeon's request and post-op pain management  Laterality: Left  Prep: chloraprep       Needles:  Injection technique: Single-shot  Needle Type: Echogenic Needle     Needle Length: 9cm 9 cm Needle Gauge: 21 and 21 G    Additional Needles:  Procedures: ultrasound guided (picture in chart) Pectoralis block Narrative:  Start time: 02/14/2016 10:54 AM End time: 02/14/2016 11:00 AM Injection made incrementally with aspirations every 5 mL.  Performed by: Personally  Anesthesiologist: Suella Broad D  Additional Notes: Pt tolerated well. No complications noted.

## 2016-02-14 NOTE — H&P (Signed)
Kristine Parrish. Kristine Parrish  Location: [facility] Patient #: [id number] DOB: 12/14/1945 Undefined / Language: [name] / Race: White Female   History of Present Illness  The patient is a 70 year old female who presents for a follow-up for Breast cancer. The patient is a 70 year old white female who was diagnosed with a locally advanced left breast cancer about 5 months ago. The cancer originally measured 2.7 cm in the upper outer quadrant of the left breast. 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 She finished her chemotherapy about 3 weeks ago and is now ready to schedule her definitive surgery. She has thought a lot about it and still favors breast conservation. She did have a positive lymph node which now looks normal on her most recent MRI. She would be a candidate for sentinel node mapping with targeted radioactive seed localized dissection   Allergies No Known Drug Allergies  Medication History Gabapentin (600MG Tablet, Oral daily) Active. HydroCHLOROthiazide (12.5MG Capsule, Oral daily) Active. Lovastatin (20MG Tablet, Oral daily) Active. Multiple Vitamin (1 (one) Oral daily) Active. Omeprazole (20MG Capsule DR, Oral daily) Active. Probiotic Daily (Oral daily) Active. Potassium Chloride (10MEQ Tablet ER, Oral daily) Active. Medications Reconciled    Review of Systems General Not Present- Appetite Loss, Chills, Fatigue, Fever, Night Sweats, Weight Gain and Weight Loss. Skin Not Present- Change in Wart/Mole, Dryness, Hives, Jaundice, New Lesions, Non-Healing Wounds, Rash and Ulcer. HEENT Not Present- Earache, Hearing Loss, Hoarseness, Nose Bleed, Oral Ulcers, Ringing in the Ears, Seasonal Allergies, Sinus Pain, Sore Throat, Visual Disturbances, Wears glasses/contact lenses and Yellow Eyes. Respiratory Not Present- Bloody sputum, Chronic Cough, Difficulty Breathing, Snoring and Wheezing. Breast Present- Breast Mass. Not Present- Breast  Pain, Nipple Discharge and Skin Changes. Cardiovascular Not Present- Chest Pain, Difficulty Breathing Lying Down, Leg Cramps, Palpitations, Rapid Heart Rate, Shortness of Breath and Swelling of Extremities. Gastrointestinal Not Present- Abdominal Pain, Bloating, Bloody Stool, Change in Bowel Habits, Chronic diarrhea, Constipation, Difficulty Swallowing, Excessive gas, Gets full quickly at meals, Hemorrhoids, Indigestion, Nausea, Rectal Pain and Vomiting. Female Genitourinary Not Present- Frequency, Nocturia, Painful Urination, Pelvic Pain and Urgency. Musculoskeletal Not Present- Back Pain, Joint Pain, Joint Stiffness, Muscle Pain, Muscle Weakness and Swelling of Extremities. Neurological Not Present- Decreased Memory, Fainting, Headaches, Numbness, Seizures, Tingling, Tremor, Trouble walking and Weakness. Psychiatric Not Present- Anxiety, Bipolar, Change in Sleep Pattern, Depression, Fearful and Frequent crying. Endocrine Not Present- Cold Intolerance, Excessive Hunger, Hair Changes, Heat Intolerance, Hot flashes and New Diabetes. Hematology Not Present- Easy Bruising, Excessive bleeding, Gland problems, HIV and Persistent Infections.  Vitals  Weight: 168.6 lb Height: 64.5in Body Surface Area: 1.83 m Body Mass Index: 28.49 kg/m  Temp.: 98.6F  BP: 122/78 (Sitting, Left Arm, Standard)       Physical Exam  General Mental Status-Alert. General Appearance-Consistent with stated age. Hydration-Well hydrated. Voice-Normal.  Head and Neck Head-normocephalic, atraumatic with no lesions or palpable masses. Trachea-midline. Thyroid Gland Characteristics - normal size and consistency.  Eye Eyeball - Bilateral-Extraocular movements intact. Sclera/Conjunctiva - Bilateral-No scleral icterus.  Chest and Lung Exam Chest and lung exam reveals -quiet, even and easy respiratory effort with no use of accessory muscles and on auscultation, normal breath sounds, no  adventitious sounds and normal vocal resonance. Inspection Chest Wall - Normal. Back - normal.  Breast Note: There is a vague palpable fullness in the upper outer left breast. There is no palpable mass in the right breast. There is no palpable axillary, supraclavicular, or  cervical lymphadenopathy   Cardiovascular Cardiovascular examination reveals -normal heart sounds, regular rate and rhythm with no murmurs and normal pedal pulses bilaterally.  Abdomen Inspection Inspection of the abdomen reveals - No Hernias. Skin - Scar - no surgical scars. Palpation/Percussion Palpation and Percussion of the abdomen reveal - Soft, Non Tender, No Rebound tenderness, No Rigidity (guarding) and No hepatosplenomegaly. Auscultation Auscultation of the abdomen reveals - Bowel sounds normal.  Neurologic Neurologic evaluation reveals -alert and oriented x 3 with no impairment of recent or remote memory. Mental Status-Normal.  Musculoskeletal Normal Exam - Left-Upper Extremity Strength Normal and Lower Extremity Strength Normal. Normal Exam - Right-Upper Extremity Strength Normal and Lower Extremity Strength Normal.  Lymphatic Head & Neck  General Head & Neck Lymphatics: Bilateral - Description - Normal. Axillary  General Axillary Region: Bilateral - Description - Normal. Tenderness - Non Tender. Femoral & Inguinal  Generalized Femoral & Inguinal Lymphatics: Bilateral - Description - Normal. Tenderness - Non Tender.    Assessment & Plan  BREAST CANCER OF UPPER-OUTER QUADRANT OF LEFT FEMALE BREAST (C50.412) Impression: The patient has completed neoadjuvant chemotherapy for a locally advanced left breast cancer. Her MRI has shown significant decrease in the enhancement in the upper outer left breast and her lymph nodes now looked normal. I have talked her in detail about the options for treatment and at this point she favors breast conservation. I do think she would be a good candidate  for sentinel node mapping with a targeted dissection. I have discussed with her in detail the risks and benefits of the operation as well as some of the technical aspects and she understands and wishes to proceed. I will plan for a left breast radioactive seed localized lumpectomy with sentinel node mapping and left radioactive seed localized axillary targeted dissection

## 2016-02-14 NOTE — Transfer of Care (Signed)
Immediate Anesthesia Transfer of Care Note  Patient: Kristine Parrish  Procedure(s) Performed: Procedure(s): LEFT BREAST RADIOACTIVE SEED GUIDED LUMPECTOMY WITH LEFT  SENTINEL LYMPH NODE BIOPSY AND LEFT  SEED TARGETED DISSECTION (Left)  Patient Location: PACU  Anesthesia Type:General  Level of Consciousness: sedated  Airway & Oxygen Therapy: Patient Spontanous Breathing and Patient connected to nasal cannula oxygen  Post-op Assessment: Report given to RN and Post -op Vital signs reviewed and stable  Post vital signs: Reviewed and stable  Last Vitals:  Vitals:   02/14/16 0849 02/14/16 1301  BP: (!) 141/69 132/67  Pulse: 88   Resp: 18 14  Temp: 36.3 C 36.6 C    Last Pain:  Vitals:   02/14/16 1301  TempSrc:   PainSc: Asleep      Patients Stated Pain Goal: 3 (24/46/95 0722)  Complications: No apparent anesthesia complications

## 2016-02-17 ENCOUNTER — Encounter (HOSPITAL_COMMUNITY): Payer: Self-pay | Admitting: General Surgery

## 2016-02-20 ENCOUNTER — Ambulatory Visit: Payer: BLUE CROSS/BLUE SHIELD

## 2016-02-20 ENCOUNTER — Other Ambulatory Visit: Payer: BLUE CROSS/BLUE SHIELD

## 2016-02-26 ENCOUNTER — Encounter: Payer: Self-pay | Admitting: Radiation Oncology

## 2016-02-27 ENCOUNTER — Other Ambulatory Visit (HOSPITAL_BASED_OUTPATIENT_CLINIC_OR_DEPARTMENT_OTHER): Payer: BLUE CROSS/BLUE SHIELD

## 2016-02-27 ENCOUNTER — Ambulatory Visit (HOSPITAL_BASED_OUTPATIENT_CLINIC_OR_DEPARTMENT_OTHER): Payer: BLUE CROSS/BLUE SHIELD | Admitting: Oncology

## 2016-02-27 ENCOUNTER — Ambulatory Visit (HOSPITAL_BASED_OUTPATIENT_CLINIC_OR_DEPARTMENT_OTHER): Payer: BLUE CROSS/BLUE SHIELD

## 2016-02-27 ENCOUNTER — Telehealth: Payer: Self-pay | Admitting: Oncology

## 2016-02-27 VITALS — BP 151/71 | HR 84 | Temp 97.5°F | Resp 18 | Ht 64.5 in | Wt 168.8 lb

## 2016-02-27 DIAGNOSIS — Z5112 Encounter for antineoplastic immunotherapy: Secondary | ICD-10-CM

## 2016-02-27 DIAGNOSIS — C50412 Malignant neoplasm of upper-outer quadrant of left female breast: Secondary | ICD-10-CM

## 2016-02-27 DIAGNOSIS — C773 Secondary and unspecified malignant neoplasm of axilla and upper limb lymph nodes: Secondary | ICD-10-CM | POA: Diagnosis not present

## 2016-02-27 DIAGNOSIS — Z170 Estrogen receptor positive status [ER+]: Secondary | ICD-10-CM

## 2016-02-27 LAB — CBC WITH DIFFERENTIAL/PLATELET
BASO%: 0.5 % (ref 0.0–2.0)
Basophils Absolute: 0 10*3/uL (ref 0.0–0.1)
EOS%: 8.6 % — ABNORMAL HIGH (ref 0.0–7.0)
Eosinophils Absolute: 0.7 10*3/uL — ABNORMAL HIGH (ref 0.0–0.5)
HCT: 33.5 % — ABNORMAL LOW (ref 34.8–46.6)
HGB: 10.5 g/dL — ABNORMAL LOW (ref 11.6–15.9)
LYMPH%: 21.8 % (ref 14.0–49.7)
MCH: 27.9 pg (ref 25.1–34.0)
MCHC: 31.3 g/dL — ABNORMAL LOW (ref 31.5–36.0)
MCV: 88.9 fL (ref 79.5–101.0)
MONO#: 0.4 10*3/uL (ref 0.1–0.9)
MONO%: 5.6 % (ref 0.0–14.0)
NEUT#: 5 10*3/uL (ref 1.5–6.5)
NEUT%: 63.5 % (ref 38.4–76.8)
Platelets: 222 10*3/uL (ref 145–400)
RBC: 3.77 10*6/uL (ref 3.70–5.45)
RDW: 15.5 % — ABNORMAL HIGH (ref 11.2–14.5)
WBC: 7.9 10*3/uL (ref 3.9–10.3)
lymph#: 1.7 10*3/uL (ref 0.9–3.3)

## 2016-02-27 LAB — COMPREHENSIVE METABOLIC PANEL
ALT: 21 U/L (ref 0–55)
AST: 19 U/L (ref 5–34)
Albumin: 3.5 g/dL (ref 3.5–5.0)
Alkaline Phosphatase: 158 U/L — ABNORMAL HIGH (ref 40–150)
Anion Gap: 10 mEq/L (ref 3–11)
BUN: 15.5 mg/dL (ref 7.0–26.0)
CO2: 27 mEq/L (ref 22–29)
Calcium: 9.5 mg/dL (ref 8.4–10.4)
Chloride: 106 mEq/L (ref 98–109)
Creatinine: 0.8 mg/dL (ref 0.6–1.1)
EGFR: 76 mL/min/{1.73_m2} — ABNORMAL LOW (ref >90)
Glucose: 138 mg/dl (ref 70–140)
Potassium: 3.9 mEq/L (ref 3.5–5.1)
Sodium: 142 mEq/L (ref 136–145)
Total Bilirubin: 0.34 mg/dL (ref 0.20–1.20)
Total Protein: 6.8 g/dL (ref 6.4–8.3)

## 2016-02-27 MED ORDER — SODIUM CHLORIDE 0.9 % IV SOLN
Freq: Once | INTRAVENOUS | Status: AC
  Administered 2016-02-27: 13:00:00 via INTRAVENOUS

## 2016-02-27 MED ORDER — ACETAMINOPHEN 325 MG PO TABS
650.0000 mg | ORAL_TABLET | Freq: Once | ORAL | Status: AC
  Administered 2016-02-27: 650 mg via ORAL

## 2016-02-27 MED ORDER — ACETAMINOPHEN 325 MG PO TABS
ORAL_TABLET | ORAL | Status: AC
  Filled 2016-02-27: qty 2

## 2016-02-27 MED ORDER — HEPARIN SOD (PORK) LOCK FLUSH 100 UNIT/ML IV SOLN
500.0000 [IU] | Freq: Once | INTRAVENOUS | Status: AC | PRN
  Administered 2016-02-27: 500 [IU]
  Filled 2016-02-27: qty 5

## 2016-02-27 MED ORDER — SODIUM CHLORIDE 0.9% FLUSH
10.0000 mL | INTRAVENOUS | Status: DC | PRN
  Administered 2016-02-27: 10 mL
  Filled 2016-02-27: qty 10

## 2016-02-27 MED ORDER — TRASTUZUMAB CHEMO 150 MG IV SOLR
6.0000 mg/kg | Freq: Once | INTRAVENOUS | Status: AC
  Administered 2016-02-27: 462 mg via INTRAVENOUS
  Filled 2016-02-27: qty 22

## 2016-02-27 MED ORDER — DIPHENHYDRAMINE HCL 25 MG PO CAPS
ORAL_CAPSULE | ORAL | Status: AC
  Filled 2016-02-27: qty 1

## 2016-02-27 MED ORDER — DIPHENHYDRAMINE HCL 25 MG PO CAPS
25.0000 mg | ORAL_CAPSULE | Freq: Once | ORAL | Status: AC
  Administered 2016-02-27: 25 mg via ORAL

## 2016-02-27 NOTE — Patient Instructions (Signed)
[facility] Discharge Instructions for Patients  Today you received the following: Herceptin   To help prevent nausea and vomiting after your treatment, we encourage you to take your nausea medication as directed.   If you develop nausea and vomiting that is not controlled by your nausea medication, call the clinic.   BELOW ARE SYMPTOMS THAT SHOULD BE REPORTED IMMEDIATELY:  *FEVER GREATER THAN 100.5 F  *CHILLS WITH OR WITHOUT FEVER  NAUSEA AND VOMITING THAT IS NOT CONTROLLED WITH YOUR NAUSEA MEDICATION  *UNUSUAL SHORTNESS OF BREATH  *UNUSUAL BRUISING OR BLEEDING  TENDERNESS IN MOUTH AND THROAT WITH OR WITHOUT PRESENCE OF ULCERS  *URINARY PROBLEMS  *BOWEL PROBLEMS  UNUSUAL RASH Items with * indicate a potential emergency and should be followed up as soon as possible.  Feel free to call the clinic you have any questions or concerns. The clinic phone number is [phone].  Please show the CHEMO ALERT CARD at check-in to the Emergency Department and triage nurse.   

## 2016-02-27 NOTE — Telephone Encounter (Signed)
Appointments scheduled per 12/21 LOS. Patient given AVS report and calendars with future scheduled appointments.

## 2016-02-27 NOTE — Progress Notes (Signed)
[place]  Telephone:(336) [phone] Fax:(336) [phone]     ID: CATHI HAZAN DOB: 1945-09-08  MR#: [id number]  GLO#:[id number]  Patient Care Team: Orpah Melter, MD as PCP - General (Family Medicine) Autumn Messing III, MD as Consulting Physician (General Surgery) Chauncey Cruel, MD as Consulting Physician (Oncology) Eppie Gibson, MD as Attending Physician (Radiation Oncology) Murvin Donning, MD (Dermatology) Laurence Spates, MD as Consulting Physician (Gastroenterology) Rosemary Holms, DPM as Consulting Physician (Podiatry) Viona Gilmore Evette Cristal, MD as Consulting Physician (Obstetrics and Gynecology) Larey Dresser, MD as Consulting Physician (Cardiology) Benson Norway, RN as Registered Nurse PCP: Orpah Melter, MD OTHER MD:  CHIEF COMPLAINT: Estrogen receptor positive breast cancer  CURRENT TREATMENT: anti-HER-2 immunotherapy; adjuvant radiation pending   BREAST CANCER HISTORY: From the original intake note  Jeannetta herself noted a change in her left breast and brought it to the attention of Dr. Nori Riis, who set her up for bilateral diagnostic mammography with tomography and left breast ultrasonography at the [facility] 08/09/2015. The breast density was category D. In the area of palpable concern there was a mass with irregular margins and architectural distortion, measuring approximately 3 cm. This was palpable in the upper outer quadrant near the nipple. The ultrasound confirmed an irregular hypoechoic mass approximately 3 cm from the nipple at the 10:00 position measuring 2.7 cm. Ultrasound of the left axilla found a single level I left axillary node with focal cortical thickening.  On 08/13/2015 the patient underwent biopsy of the left breast mass and the suspicious left axillary lymph node, both showing invasive ductal carcinoma, grade 2 or 3, estrogen receptor 100% positive, progesterone receptor 100% positive, both with strong staining intensity, with an MIB-1 of 50%,  and HER-2 amplification, the signals ratio being 2.2 to and the number per cell 3.55  The patient's subsequent history is as detailed below  INTERVAL HISTORY: [name] returns today for follow-up of her HER-2/neu positive breast cancer. Since her last visit here she underwent left lumpectomy and sentinel lymph node sampling 02/14/2016. The final pathology ([id number]) showed a residual invasive ductal carcinoma, grade 2, measuring 1.3 cm. Margins were negative. In April One out of 5 sentinel lymph nodes sampled was involved. Repeat prognostic panel from this material showed estrogen receptor 100% positive, progesterone receptor 95% positive, both with strong staining intensity, and HER-2 nonamplified, with a signals ratio of 1.6 for the number per cell 2.05. Recall she was previously HER-2 positive.  Her residual cancer burden was 2.  Her case was reviewed in the disciplinary breast cancer conference 02/26/2016. At that time it was felt that she would benefit from radiation as well as adjuvant anti-estrogens   REVIEW OF SYSTEMS: Valory did well with her surgery, although she still has a little bit of soreness and she can't sleep on her left side. She has limited range of motion of the left upper extremity and has not yet been reviewed for to physical therapy. She tells me her energy is coming back and while she used to have trouble going upstairs now she walks on up without too much difficulty. She is looking forward to feeling yet better so she can do her normal activities. Her hair is beginning to grow back. A detailed review of systems today was otherwise stable  PAST MEDICAL HISTORY: Past Medical History:  Diagnosis Date  . Arthritis    "hands "  . Breast cancer of upper-outer quadrant of left female breast ([place]) 08/15/2015  . Colon polyps   .  Dyspnea    with exertion- "from chemo"  . GERD (gastroesophageal reflux disease)   . Hiatal hernia   . History of bronchitis   . Hypertension   .  Neuropathy ([place])    from chemopathy  . Peptic ulcer disease   . Peripheral neuropathy ([place])   . Type 2 diabetes mellitus ([place])    Type II    PAST SURGICAL HISTORY: Past Surgical History:  Procedure Laterality Date  . APPENDECTOMY  1974  . cataract surgery Bilateral 04/2012  . CHOLECYSTECTOMY  1996  . COLONOSCOPY    . FOOT SURGERY     left bunion removed  . nerve removed from left foot  2000  . PORTACATH PLACEMENT N/A 09/05/2015   Procedure: INSERTION PORT-A-CATH;  Surgeon: Autumn Messing III, MD;  Location: [facility];  Service: General;  Laterality: N/A;  . RADIOACTIVE SEED GUIDED PARTIAL MASTECTOMY/AXILLARY SENTINEL NODE BIOPSY/AXILLARY NODE DISSECTION Left 02/14/2016   Procedure: LEFT BREAST RADIOACTIVE SEED GUIDED LUMPECTOMY WITH LEFT  SENTINEL LYMPH NODE BIOPSY AND LEFT  SEED TARGETED DISSECTION;  Surgeon: Autumn Messing III, MD;  Location: [facility];  Service: General;  Laterality: Left;  . TUBAL LIGATION    . VAGINAL HYSTERECTOMY  1995    FAMILY HISTORY Family History  Problem Relation Age of Onset  . Diabetes Mother   . Hypertension Mother   . Alcoholism Maternal Grandfather   . Congestive Heart Failure Maternal Grandmother   The patient has little information about her father and is not sure of his cause of death or age at death. The patient's mother died at age 55. The patient had no siblings. She was raised by her grandmother. She is not aware of any breast or ovarian cancer history in the family   GYNECOLOGIC HISTORY:  No LMP recorded. Patient has had a hysterectomy.  menarche age 62, first live birth age 31. The patient is GX P2. She underwent hysterectomy with bilateral salpingo-oophorectomy in 1994, and has been on estrogen replacement since that time, discontinued June 2017.   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 She is divorced and lives alone, with 2 cats. Daughter [name] lives in [place] and works as a Teacher, music  for the  [employer]. Son Anniemae Haberkorn lives in [place] where he is Secondary school teacher. The patient had one grandchild. She attends [employer]. The patient's daughter [name] is her healthcare power of attorney.     HEALTH MAINTENANCE: Social History  Substance Use Topics  . Smoking status: Never Smoker  . Smokeless tobacco: Never Used  . Alcohol use No     Colonoscopy: 2012/[facility]  PAP:  Bone density: 2015/"normal" at Dr. Verlon Au  Lipid panel:  Allergies  Allergen Reactions  . No Known Allergies     Current Outpatient Prescriptions  Medication Sig Dispense Refill  . aspirin 81 MG tablet Take 81 mg by mouth daily.    Marland Kitchen gabapentin (NEURONTIN) 800 MG tablet Take 800 mg by mouth 3 (three) times daily.    . hydrochlorothiazide (MICROZIDE) 12.5 MG capsule Take 12.5 mg by mouth daily.    Marland Kitchen lovastatin (MEVACOR) 20 MG tablet Take 20 mg by mouth at bedtime.    . metFORMIN (GLUCOPHAGE) 500 MG tablet Take 500 mg by mouth 2 (two) times daily with a meal.     . Multiple Vitamin (MULTIVITAMIN) tablet Take 1 tablet by mouth daily.    Marland Kitchen  omeprazole (PRILOSEC) 20 MG capsule Take 20 mg by mouth daily.    . potassium chloride (MICRO-K) 10 MEQ CR capsule Take 2 capsules (20 mEq total) by mouth daily. 60 capsule 0  . Probiotic Product (PROBIOTIC DAILY PO) Take by mouth. Reported on 08/21/2015     No current facility-administered medications for this visit.     OBJECTIVE: Middle-aged white woman In no acute distress  Vitals:   02/27/16 1237  BP: (!) 151/71  Pulse: 84  Resp: 18  Temp: 97.5 F (36.4 C)     Body mass index is 28.53 kg/m.    ECOG FS:1 - Symptomatic but completely ambulatory  Sclerae unicteric, pupils round and equal Oropharynx clear and moist-- no thrush or other lesions No cervical or supraclavicular adenopathy Lungs no rales or rhonchi Heart regular rate and rhythm Abd soft, nontender, positive bowel  sounds MSK no focal spinal tenderness, no upper extremity lymphedema Neuro: nonfocal, well oriented, appropriate affect Breasts: The right breast is benign. The left breast is status post recent lumpectomy. The cosmetic result is excellent. The incisions are healing nicely, without erythema, swelling, or dehiscence. The left axilla is benign.    LAB RESULTS:  CMP     Component Value Date/Time   NA 141 02/06/2016 0935   K 4.1 02/06/2016 0935   CL 102 09/03/2015 1542   CO2 26 02/06/2016 0935   GLUCOSE 183 (H) 02/06/2016 0935   BUN 8.3 02/06/2016 0935   CREATININE 0.8 02/06/2016 0935   CALCIUM 9.6 02/06/2016 0935   PROT 6.8 02/06/2016 0935   ALBUMIN 3.2 (L) 02/06/2016 0935   AST 15 02/06/2016 0935   ALT 18 02/06/2016 0935   ALKPHOS 182 (H) 02/06/2016 0935   BILITOT 0.31 02/06/2016 0935   GFRNONAA >60 09/03/2015 1542   GFRAA >60 09/03/2015 1542    INo results found for: SPEP, UPEP  Lab Results  Component Value Date   WBC 7.9 02/27/2016   NEUTROABS 5.0 02/27/2016   HGB 10.5 (L) 02/27/2016   HCT 33.5 (L) 02/27/2016   MCV 88.9 02/27/2016   PLT 222 02/27/2016      Chemistry      Component Value Date/Time   NA 141 02/06/2016 0935   K 4.1 02/06/2016 0935   CL 102 09/03/2015 1542   CO2 26 02/06/2016 0935   BUN 8.3 02/06/2016 0935   CREATININE 0.8 02/06/2016 0935      Component Value Date/Time   CALCIUM 9.6 02/06/2016 0935   ALKPHOS 182 (H) 02/06/2016 0935   AST 15 02/06/2016 0935   ALT 18 02/06/2016 0935   BILITOT 0.31 02/06/2016 0935       No results found for: LABCA2  No components found for: LABCA125  No results for input(s): INR in the last 168 hours.  Urinalysis No results found for: COLORURINE, APPEARANCEUR, LABSPEC, PHURINE, GLUCOSEU, HGBUR, BILIRUBINUR, KETONESUR, PROTEINUR, UROBILINOGEN, NITRITE, LEUKOCYTESUR  STUDIES: Mm Breast Surgical Specimen  Result Date: 02/14/2016 CLINICAL DATA:  70 year old female with lumpectomy and left axillary lymph  node removal for left breast cancer and metastatic left axillary lymph node. Evaluate left breast surgical specimen. EXAM: SPECIMEN RADIOGRAPH OF THE LEFT BREAST COMPARISON:  Previous exam(s). FINDINGS: Status post excision of the left breast. The radioactive seed and biopsy marker clip are present, completely intact, and were marked for pathology. IMPRESSION: Specimen radiograph of the left breast. Electronically Signed   By: Margarette Canada M.D.   On: 02/14/2016 14:25   Mm Breast Surgical Specimen  Result Date: 02/14/2016  CLINICAL DATA:  70 year old female with lumpectomy and left axillary lymph node removal for left breast cancer and metastatic left axillary lymph node. Evaluate left axillary lymph nodes specimen. EXAM: SPECIMEN RADIOGRAPH OF THE LEFT AXILLA COMPARISON:  Previous exam(s). FINDINGS: Status post excision of the left axilla. The radioactive seed and biopsy marker clip are present, completely intact. IMPRESSION: Specimen radiograph of the left axilla. Electronically Signed   By: Margarette Canada M.D.   On: 02/14/2016 12:09   Mm Lt Radioactive Seed Loc Mammo Guide  Result Date: 02/12/2016 CLINICAL DATA:  70 year old female presenting for localization prior to left lumpectomy and left axillary lymph node dissection. EXAM: MAMMOGRAPHIC GUIDED RADIOACTIVE SEED LOCALIZATION OF THE LEFT BREAST COMPARISON:  Previous exam(s). FINDINGS: Patient presents for radioactive seed localization prior to left breast lumpectomy and left axillary lymph node dissection. I met with the patient and we discussed the procedure of seed localization including benefits and alternatives. We discussed the high likelihood of a successful procedure. We discussed the risks of the procedure including infection, bleeding, tissue injury and further surgery. We discussed the low dose of radioactivity involved in the procedure. Informed, written consent was given. The usual time-out protocol was performed immediately prior to the  procedure. Using mammographic guidance, sterile technique, 1% lidocaine and an I-125 radioactive seed, the left breast mass in the upper-outer quadrant was localized using a lateral approach. The follow-up mammogram images confirm the seed in the expected location and were marked for Dr. Marlou Starks. Follow-up survey of the patient confirms presence of the radioactive seed. Order number of I-125 seed:  [id number]. Total activity:  6.269 millicuries  Reference Date: 01/22/2016 The patient tolerated the procedure well and was released from the [facility]. She was given instructions regarding seed removal. IMPRESSION: Radioactive seed localization left breast. No apparent complications. The localization for the left axillary lymph node will be dictated in a separate report. Electronically Signed   By: Ammie Ferrier M.D.   On: 02/12/2016 14:17   [place] Lt Radioactive Seed Ea Add Lesion  Result Date: 02/12/2016 CLINICAL DATA:  70 year old female presenting for localization prior to left lumpectomy and targeted left axillary lymph node dissection. EXAM: ULTRASOUND GUIDED RADIOACTIVE SEED LOCALIZATION OF THE LEFT BREAST COMPARISON:  Previous exam(s). FINDINGS: Patient presents for radioactive seed localization prior to left lumpectomy and targeted left axillary lymph node dissection. I met with the patient and we discussed the procedure of seed localization including benefits and alternatives. We discussed the high likelihood of a successful procedure. We discussed the risks of the procedure including infection, bleeding, tissue injury and further surgery. We discussed the low dose of radioactivity involved in the procedure. Informed, written consent was given. The usual time-out protocol was performed immediately prior to the procedure. Using ultrasound guidance, sterile technique, 1% lidocaine and an I-125 radioactive seed, a left axillary lymph node was localized using an inferolateral approach. The follow-up mammogram  images confirm the seed in the expected location and were marked for Dr. Marlou Starks. Follow-up survey of the patient confirms presence of the radioactive seed. Order number of I-125 seed:  [id number]. Total activity: 5.009 millicuries Reference Date: 01/22/2016 The patient tolerated the procedure well and was released from the [facility]. She was given instructions regarding seed removal. IMPRESSION: Radioactive seed localization of a left axillary lymph node. No apparent complications. The localization for the left breast mass will be dictated in a separate report. Electronically Signed   By: Ammie Ferrier M.D.   On: 02/12/2016 14:15  ELIGIBLE FOR AVAILABLE RESEARCH PROTOCOL: PALLAS  ASSESSMENT: 70 y.o. [place] woman status post left breast upper outer quadrant and left axillary lymph node biopsy 08/13/2015 both positive for an invasive ductal carcinoma, grade 2 or 3, triple positive, with an MIB-1 of 50%  (1) neoadjuvant chemotherapy consisting of carboplatin, docetaxel, trastuzumab and pertuzumab given every 21 days Started 09/11/2015  (a) treatment changed to Abraxane and trastuzumab with cycle 2 because of side effects after cycle 1  (b) Abraxane discontinued after 3d dose (10/17/2015) because of peripheral neuropathy  (c) cyclophosphamide, methotrexate and fluorouracil (CMF) started 11/07/2015, 4 cycles, last dose 01/09/2016   (2) trastuzumab will be continued to complete a year (Through June 2018).  (a) most recent echocardiogram 12/31/2015 showed an ejection fraction of 55-60%   (3) status post left lumpectomy with TAD 02/14/2016 showing a residual pT1c pNi invasive ductal carcinoma, grade 2, estrogen and progesterone receptor positive, now HER-2 not amplified; margins were negative  (4) adjuvant radiation pending  (5) anti-estrogens to follow at the completion of local treatment, with consideration of the PALLAS trial  PLAN: Samika is recovering well from her chemotherapy and from  her surgery and after the holidays she will be ready to start adjuvant radiation treatments.  Today we discussed her pathology report. The striking finding of course is the fact that her residual tumor was HER-2 negative. With this means is that we are being successful at clearing the more aggressive portion of the tumor, the her 2 positive part. We do not expect to eliminate the HER-2 negative estrogen receptor strongly positive portion of her tumor with chemotherapy, since those tumors generally don't respond as well to chemotherapy. On the other hand they will be very sensitive to anti-estrogens and she will start antiestrogen as soon as she completes her radiation treatments.  I alerted her today to the possibility of posttraumatic stress which is very common at this point in her treatment. I suggested she enroll in finding your new normal and gave her the appropriate information.  She will benefit from physical therapy to the left upper extremity as soon as her surgeon gives her clearance  She knows to call for any problems before the next visit here, which will be in March. She will have a repeat echocardiogram before that  Chauncey Cruel, MD   02/27/2016 12:55 PM Medical Oncology and Hematology [place]

## 2016-03-03 DIAGNOSIS — I10 Essential (primary) hypertension: Secondary | ICD-10-CM | POA: Diagnosis not present

## 2016-03-03 DIAGNOSIS — E7800 Pure hypercholesterolemia, unspecified: Secondary | ICD-10-CM | POA: Diagnosis not present

## 2016-03-03 DIAGNOSIS — E1149 Type 2 diabetes mellitus with other diabetic neurological complication: Secondary | ICD-10-CM | POA: Diagnosis not present

## 2016-03-12 ENCOUNTER — Ambulatory Visit: Payer: BLUE CROSS/BLUE SHIELD

## 2016-03-12 ENCOUNTER — Other Ambulatory Visit: Payer: BLUE CROSS/BLUE SHIELD

## 2016-03-13 DIAGNOSIS — N6489 Other specified disorders of breast: Secondary | ICD-10-CM | POA: Diagnosis not present

## 2016-03-13 NOTE — Progress Notes (Signed)
Location of Breast Cancer: Left Breast  Histology per Pathology Report:  08/13/15 Diagnosis 1. Breast, left, needle core biopsy, 2:00 o'clock - INVASIVE DUCTAL CARCINOMA. - SEE COMMENT.  Receptor status: ER (100%), PR (100%) Her2-neu (POS), Ki- (50%)  2. Lymph node, needle/core biopsy, left axillary left node - POSITIVE FOR METASTATIC DUCTAL CARCINOMA  Receptor Status: ER(100%), PR (90%), Her2-neu (POS), Ki-(40%)  02/14/16 Diagnosis 1. Lymph node, sentinel, biopsy, Left axillary #1 - METASTATIC CARCINOMA IN 1 OF 1 LYMPH NODE (1/1). 2. Lymph node, sentinel, biopsy, Left axillary #2 - THERE IS NO EVIDENCE OF CARCINOMA IN 1 OF 1 LYMPH NODE (0/1). 3. Lymph node, sentinel, biopsy, Left axillary #3 - THERE IS NO EVIDENCE OF CARCINOMA IN 1 OF 1 LYMPH NODE (0/1). 4. Lymph node, sentinel, biopsy, Left axillary #4 - THERE IS NO EVIDENCE OF CARCINOMA IN 1 OF 1 LYMPH NODE (0/1). 5. Lymph node, sentinel, biopsy, Left axillary #5 - THERE IS NO EVIDENCE OF CARCINOMA IN 1 OF 1 LYMPH NODE (0/1). 6. Breast, lumpectomy, Left - INVASIVE DUCTAL CARCINOMA GRADE II/III, SPANNING 1.3 CM. - LYMPHOVASCULAR INVASION IS IDENTIFIED, DIFFUSE. - THE SURGICAL RESECTION MARGINS ARE NEGATIVE FOR CARCINOMA. - SEE ONCOLOGY TABLE BELOW. 6. CARCINOMA OF THE BREAST, STATUS POST NEOADJUVANT THERAPY  Receptor status: ER (100%), PR (95%), Her2-neu (NEG)  Did patient present with symptoms or was this found on screening mammography?: She noted a change herself in her Left Breast and brought it to the attention of Dr. Nori Riis who ordered a mammogram.   Past/Anticipated interventions by surgeon, if any: PROCEDURE:  Procedure(s): LEFT BREAST RADIOACTIVE SEED GUIDED LUMPECTOMY WITH LEFT  SENTINEL LYMPH NODE BIOPSY AND LEFT  SEED TARGETED DISSECTION (Left) SURGEON:  Surgeon(s) and Role:    * Autumn Messing III, MD - Primary  She had fluid drained from her surgery site 03/13/16, and was started on an antibiotic. She has an  appointment for follow up with Dr. Marlou Starks today.   Past/Anticipated interventions by medical oncology, if any: Dr. Jana Hakim 02/27/16  1.Neoadjuvant chemotherapy consisting of carboplatin, docetaxel, trastuzumab and pertuzumab given every 21 days Started 09/11/2015             (a) treatment changed to Abraxane and trastuzumab with cycle 2 because of side effects after cycle 1             (b) Abraxane discontinued after 3d dose (10/17/2015) because of peripheral neuropathy             (c) cyclophosphamide, methotrexate and fluorouracil (CMF) started 11/07/2015, 4 cycles, last dose 01/09/2016    2.Trastuzumab will be continued to complete a year (Through June 2018).             (a) most recent echocardiogram 12/31/2015 showed an ejection fraction of 55-60%    3.Adjuvant radiation pending   4.Anti-estrogens to follow at the completion of local treatment, with consideration of the PALLAS trial  Lymphedema issues, if any:  She denies. She has good arm mobility.   Pain issues, if any: She reports discomfort to her Left Breast area. She does have pain to her Right Lower extremity. She has chronic swelling to this area. She has been to several doctors for this pain without improvement at this time.     SAFETY ISSUES:  Prior radiation? No  Pacemaker/ICD? No  Possible current pregnancy? No  Is the patient on methotrexate? No  Current Complaints / other details:   GYNECOLOGIC HISTORY:  No LMP recorded. Patient has had a hysterectomy.  menarche age 31, first live birth age 44. The patient is GX P2.  She underwent hysterectomy with bilateral salpingo-oophorectomy in 1994, and has been on estrogen replacement since that time, discontinued June 2017.   BP 137/71   Pulse 95   Temp 98.3 F (36.8 C)   Ht 5' 4.5" (1.638 m)   Wt 169 lb 12.8 oz (77 kg)   SpO2 99% Comment: room air  BMI 28.70 kg/m    Wt Readings from Last 3 Encounters:  03/20/16 169 lb 12.8 oz (77 kg)  02/27/16 168 lb 12.8  oz (76.6 kg)  02/14/16 162 lb 9 oz (73.7 kg)     Kendyl Festa, Stephani Police, RN 03/13/2016,8:54 AM

## 2016-03-19 ENCOUNTER — Other Ambulatory Visit (HOSPITAL_BASED_OUTPATIENT_CLINIC_OR_DEPARTMENT_OTHER): Payer: BLUE CROSS/BLUE SHIELD

## 2016-03-19 ENCOUNTER — Ambulatory Visit (HOSPITAL_BASED_OUTPATIENT_CLINIC_OR_DEPARTMENT_OTHER): Payer: BLUE CROSS/BLUE SHIELD

## 2016-03-19 VITALS — BP 134/76 | HR 91 | Temp 97.7°F | Resp 20

## 2016-03-19 DIAGNOSIS — C50412 Malignant neoplasm of upper-outer quadrant of left female breast: Secondary | ICD-10-CM | POA: Diagnosis not present

## 2016-03-19 DIAGNOSIS — Z5112 Encounter for antineoplastic immunotherapy: Secondary | ICD-10-CM | POA: Diagnosis not present

## 2016-03-19 DIAGNOSIS — Z170 Estrogen receptor positive status [ER+]: Secondary | ICD-10-CM

## 2016-03-19 LAB — COMPREHENSIVE METABOLIC PANEL
ALT: 17 U/L (ref 0–55)
AST: 16 U/L (ref 5–34)
Albumin: 3.4 g/dL — ABNORMAL LOW (ref 3.5–5.0)
Alkaline Phosphatase: 173 U/L — ABNORMAL HIGH (ref 40–150)
Anion Gap: 10 mEq/L (ref 3–11)
BUN: 17.7 mg/dL (ref 7.0–26.0)
CO2: 24 mEq/L (ref 22–29)
Calcium: 9.4 mg/dL (ref 8.4–10.4)
Chloride: 104 mEq/L (ref 98–109)
Creatinine: 0.8 mg/dL (ref 0.6–1.1)
EGFR: 71 mL/min/{1.73_m2} — ABNORMAL LOW (ref >90)
Glucose: 180 mg/dl — ABNORMAL HIGH (ref 70–140)
Potassium: 3.6 mEq/L (ref 3.5–5.1)
Sodium: 139 mEq/L (ref 136–145)
Total Bilirubin: 0.43 mg/dL (ref 0.20–1.20)
Total Protein: 6.7 g/dL (ref 6.4–8.3)

## 2016-03-19 LAB — CBC WITH DIFFERENTIAL/PLATELET
BASO%: 0.4 % (ref 0.0–2.0)
Basophils Absolute: 0 10*3/uL (ref 0.0–0.1)
EOS%: 5 % (ref 0.0–7.0)
Eosinophils Absolute: 0.4 10*3/uL (ref 0.0–0.5)
HCT: 35 % (ref 34.8–46.6)
HGB: 11 g/dL — ABNORMAL LOW (ref 11.6–15.9)
LYMPH%: 23.4 % (ref 14.0–49.7)
MCH: 27.3 pg (ref 25.1–34.0)
MCHC: 31.4 g/dL — ABNORMAL LOW (ref 31.5–36.0)
MCV: 86.8 fL (ref 79.5–101.0)
MONO#: 0.4 10*3/uL (ref 0.1–0.9)
MONO%: 6 % (ref 0.0–14.0)
NEUT#: 4.5 10*3/uL (ref 1.5–6.5)
NEUT%: 65.2 % (ref 38.4–76.8)
Platelets: 218 10*3/uL (ref 145–400)
RBC: 4.03 10*6/uL (ref 3.70–5.45)
RDW: 15.1 % — ABNORMAL HIGH (ref 11.2–14.5)
WBC: 7 10*3/uL (ref 3.9–10.3)
lymph#: 1.6 10*3/uL (ref 0.9–3.3)

## 2016-03-19 MED ORDER — DIPHENHYDRAMINE HCL 25 MG PO CAPS
ORAL_CAPSULE | ORAL | Status: AC
  Filled 2016-03-19: qty 1

## 2016-03-19 MED ORDER — SODIUM CHLORIDE 0.9% FLUSH
10.0000 mL | INTRAVENOUS | Status: DC | PRN
  Administered 2016-03-19: 10 mL
  Filled 2016-03-19: qty 10

## 2016-03-19 MED ORDER — TRASTUZUMAB CHEMO 150 MG IV SOLR
6.0000 mg/kg | Freq: Once | INTRAVENOUS | Status: AC
  Administered 2016-03-19: 462 mg via INTRAVENOUS
  Filled 2016-03-19: qty 22

## 2016-03-19 MED ORDER — ACETAMINOPHEN 325 MG PO TABS
ORAL_TABLET | ORAL | Status: AC
  Filled 2016-03-19: qty 2

## 2016-03-19 MED ORDER — DIPHENHYDRAMINE HCL 25 MG PO CAPS
25.0000 mg | ORAL_CAPSULE | Freq: Once | ORAL | Status: AC
  Administered 2016-03-19: 25 mg via ORAL

## 2016-03-19 MED ORDER — SODIUM CHLORIDE 0.9 % IV SOLN
Freq: Once | INTRAVENOUS | Status: AC
  Administered 2016-03-19: 10:00:00 via INTRAVENOUS

## 2016-03-19 MED ORDER — HEPARIN SOD (PORK) LOCK FLUSH 100 UNIT/ML IV SOLN
500.0000 [IU] | Freq: Once | INTRAVENOUS | Status: AC | PRN
  Administered 2016-03-19: 500 [IU]
  Filled 2016-03-19: qty 5

## 2016-03-19 MED ORDER — ACETAMINOPHEN 325 MG PO TABS
650.0000 mg | ORAL_TABLET | Freq: Once | ORAL | Status: AC
  Administered 2016-03-19: 650 mg via ORAL

## 2016-03-19 NOTE — Patient Instructions (Addendum)
[facility] Discharge Instructions for Patients Receiving Chemotherapy  Today you received the following chemotherapy agents: Herceptin   To help prevent nausea and vomiting after your treatment, we encourage you to take your nausea medication as directed.    If you develop nausea and vomiting that is not controlled by your nausea medication, call the clinic.   BELOW ARE SYMPTOMS THAT SHOULD BE REPORTED IMMEDIATELY:  *FEVER GREATER THAN 100.5 F  *CHILLS WITH OR WITHOUT FEVER  NAUSEA AND VOMITING THAT IS NOT CONTROLLED WITH YOUR NAUSEA MEDICATION  *UNUSUAL SHORTNESS OF BREATH  *UNUSUAL BRUISING OR BLEEDING  TENDERNESS IN MOUTH AND THROAT WITH OR WITHOUT PRESENCE OF ULCERS  *URINARY PROBLEMS  *BOWEL PROBLEMS  UNUSUAL RASH Items with * indicate a potential emergency and should be followed up as soon as possible.  Feel free to call the clinic you have any questions or concerns. The clinic phone number is [phone].  Please show the CHEMO ALERT CARD at check-in to the Emergency Department and triage nurse.   

## 2016-03-20 ENCOUNTER — Encounter: Payer: Self-pay | Admitting: Radiation Oncology

## 2016-03-20 ENCOUNTER — Ambulatory Visit
Admission: RE | Admit: 2016-03-20 | Discharge: 2016-03-20 | Disposition: A | Discharge status: Home / Self Care | Payer: BLUE CROSS/BLUE SHIELD | Source: Ambulatory Visit | Attending: Radiation Oncology | Admitting: Radiation Oncology

## 2016-03-20 DIAGNOSIS — K219 Gastro-esophageal reflux disease without esophagitis: Secondary | ICD-10-CM | POA: Diagnosis not present

## 2016-03-20 DIAGNOSIS — Z90710 Acquired absence of both cervix and uterus: Secondary | ICD-10-CM | POA: Diagnosis not present

## 2016-03-20 DIAGNOSIS — I10 Essential (primary) hypertension: Secondary | ICD-10-CM | POA: Diagnosis not present

## 2016-03-20 DIAGNOSIS — C50412 Malignant neoplasm of upper-outer quadrant of left female breast: Secondary | ICD-10-CM | POA: Insufficient documentation

## 2016-03-20 DIAGNOSIS — Z8249 Family history of ischemic heart disease and other diseases of the circulatory system: Secondary | ICD-10-CM | POA: Insufficient documentation

## 2016-03-20 DIAGNOSIS — Z7982 Long term (current) use of aspirin: Secondary | ICD-10-CM | POA: Insufficient documentation

## 2016-03-20 DIAGNOSIS — E119 Type 2 diabetes mellitus without complications: Secondary | ICD-10-CM | POA: Diagnosis not present

## 2016-03-20 DIAGNOSIS — K449 Diaphragmatic hernia without obstruction or gangrene: Secondary | ICD-10-CM | POA: Diagnosis not present

## 2016-03-20 DIAGNOSIS — Z833 Family history of diabetes mellitus: Secondary | ICD-10-CM | POA: Insufficient documentation

## 2016-03-20 DIAGNOSIS — Z7984 Long term (current) use of oral hypoglycemic drugs: Secondary | ICD-10-CM | POA: Diagnosis not present

## 2016-03-20 DIAGNOSIS — Z170 Estrogen receptor positive status [ER+]: Secondary | ICD-10-CM | POA: Diagnosis not present

## 2016-03-20 DIAGNOSIS — Z79899 Other long term (current) drug therapy: Secondary | ICD-10-CM | POA: Insufficient documentation

## 2016-03-20 DIAGNOSIS — Z510 Encounter for antineoplastic radiation therapy: Secondary | ICD-10-CM | POA: Insufficient documentation

## 2016-03-20 DIAGNOSIS — Z9889 Other specified postprocedural states: Secondary | ICD-10-CM | POA: Insufficient documentation

## 2016-03-20 DIAGNOSIS — Z9221 Personal history of antineoplastic chemotherapy: Secondary | ICD-10-CM | POA: Diagnosis not present

## 2016-03-20 DIAGNOSIS — Z811 Family history of alcohol abuse and dependence: Secondary | ICD-10-CM | POA: Diagnosis not present

## 2016-03-20 DIAGNOSIS — R600 Localized edema: Secondary | ICD-10-CM | POA: Diagnosis not present

## 2016-03-20 DIAGNOSIS — Z9049 Acquired absence of other specified parts of digestive tract: Secondary | ICD-10-CM | POA: Diagnosis not present

## 2016-03-20 DIAGNOSIS — Z86010 Personal history of colonic polyps: Secondary | ICD-10-CM | POA: Insufficient documentation

## 2016-03-20 NOTE — Progress Notes (Signed)
Radiation Oncology         [phone] ________________________________   Outpatient followup  Name: Kristine Parrish MRN: [id number]  Date: 03/20/2016  DOB: October 01, 1945  NO:BSJGGE, Annie Main, MD  Magrinat, Virgie Dad, MD  Autumn Messing MD  REFERRING PHYSICIAN: Magrinat, Virgie Dad, MD  DIAGNOSIS:    ICD-9-CM ICD-10-CM   1. 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 female who presented with a self-noted change in her left breast, and was scheduled to receive a bilateral diagnostic mammography on 08/09/15. In the area of palpable concern there was a mass with irregular margins and architectural distortion, measuring approximately 3 cm. This was palpable in the upper outer quadrant near the nipple. The ultrasound confirmed an irregular hypoechoic mass approximately 3 cm from the nipple at the 10:00 position measuring 2.7 cm. Ultrasound of the left axilla found a single level I left axillary node with focal cortical thickening. Biopsy on 08/13/15 showed invasive ductal carcinoma, Grade 2-3, with characteristics as described above in the diagnosis.  The patient received neoadjuvant cheotherapy with Taxotere, carboplatin, Herceptin, and Perjeta from 09/11/15 - 01/09/16. She underwent left breast lumpectomy with left sentinel node biopsy on 02/14/16 with Dr. Marlou Starks. A 1.3 cm left breast tumor was removed with diffuse LVSI, grade 2-3, with margins greater than 0.2 cm. 1 of 5 sentinel lymph nodes were positive. There was no extranodal extension. Dr. Jana Hakim saw her 02/27/16. She has plans to continue taking trastuzumab through June 2018. She has plans to start antiestrogen therapy as  soon as she completes radiation treatments.  On review of systems, the patient denies lymphedema issues and reports good arm mobility. She reports discomfort to her left breast area, as well as pain to her right lower extremity. She reports chronic swelling to this area, and has seen several doctors for this pain without improvement at this time. The patient reports she had fluid drained from her surgical site on 03/13/16 and was started on an antibiotic. She has an appointment for follow up with Dr. Marlou Starks today. She reports concern that this is an infection that will interfere with radiation treatment.  Of note, the patient was 71 years of age at menarche. She has had a hysterectomy with bilateral salpingo-oophorectomy in 1994, and has been on estrogen replacement since that time, discontinued in June 2017.  PREVIOUS RADIATION THERAPY: No  PAST MEDICAL HISTORY:  has a past medical history of Arthritis; Breast cancer of upper-outer quadrant of left female breast ([place]) (08/15/2015); Colon polyps; Dyspnea; GERD (gastroesophageal reflux disease); Hiatal hernia; History of bronchitis; Hypertension; Neuropathy ([place]); Peptic ulcer disease; Peripheral neuropathy ([place]); and Type 2 diabetes mellitus ([place]).    PAST SURGICAL HISTORY: Past Surgical History:  Procedure Laterality Date  . APPENDECTOMY  1974  . cataract surgery Bilateral 04/2012  . CHOLECYSTECTOMY  1996  . COLONOSCOPY    . FOOT SURGERY     left bunion removed  . nerve removed from left foot  2000  . PORTACATH PLACEMENT N/A 09/05/2015   Procedure: INSERTION PORT-A-CATH;  Surgeon: Autumn Messing III, MD;  Location: [facility];  Service: General;  Laterality: N/A;  . RADIOACTIVE SEED GUIDED PARTIAL  MASTECTOMY/AXILLARY SENTINEL NODE BIOPSY/AXILLARY NODE DISSECTION Left 02/14/2016   Procedure: LEFT BREAST RADIOACTIVE SEED GUIDED LUMPECTOMY WITH LEFT  SENTINEL LYMPH NODE BIOPSY AND LEFT  SEED TARGETED DISSECTION;  Surgeon: Autumn Messing III, MD;  Location: [facility];   Service: General;  Laterality: Left;  . TUBAL LIGATION    . VAGINAL HYSTERECTOMY  1995    FAMILY HISTORY: family history includes Alcoholism in her maternal grandfather; Congestive Heart Failure in her maternal grandmother; Diabetes in her mother; Hypertension in her mother.  SOCIAL HISTORY:  reports that she has never smoked. She has never used smokeless tobacco. She reports that she does not drink alcohol or use drugs.  ALLERGIES: No known allergies  MEDICATIONS:  Current Outpatient Prescriptions  Medication Sig Dispense Refill  . aspirin 81 MG tablet Take 81 mg by mouth daily.    Marland Kitchen doxycycline (VIBRAMYCIN) 100 MG capsule Take 100 mg by mouth 2 (two) times daily.    Marland Kitchen gabapentin (NEURONTIN) 800 MG tablet Take 800 mg by mouth 3 (three) times daily.    . hydrochlorothiazide (MICROZIDE) 12.5 MG capsule Take 12.5 mg by mouth daily.    Marland Kitchen lovastatin (MEVACOR) 20 MG tablet Take 20 mg by mouth at bedtime.    . metFORMIN (GLUCOPHAGE) 500 MG tablet Take 500 mg by mouth 2 (two) times daily with a meal.     . Multiple Vitamin (MULTIVITAMIN) tablet Take 1 tablet by mouth daily.    Marland Kitchen omeprazole (PRILOSEC) 20 MG capsule Take 20 mg by mouth daily.    . potassium chloride (MICRO-K) 10 MEQ CR capsule Take 2 capsules (20 mEq total) by mouth daily. 60 capsule 0  . Probiotic Product (PROBIOTIC DAILY PO) Take by mouth. Reported on 08/21/2015     No current facility-administered medications for this encounter.     REVIEW OF SYSTEMS:  As above   PHYSICAL EXAM:  height is 5' 4.5" (1.638 m) and weight is 169 lb 12.8 oz (77 kg). Her temperature is 98.3 F (36.8 C). Her blood pressure is 137/71 and her pulse is 95. Her oxygen saturation is 99%.   General: Alert and oriented, in no acute distress. HEENT: Oropharynx and oral cavity are clear. Neck: Neck is supple, no palpable cervical or supraclavicular lymphadenopathy or masses. Heart: Regular in rate and rhythm with no murmurs. Chest: Clear to auscultation  bilaterally. Abdomen: Soft, nontender, nondistended. Extremities:  edema in the ankles and feet bilaterally, including calves. Swelling is worse on the right foot than left. Lymphatics: see Neck Exam Skin: see Breast exam. Musculoskeletal: good range of motion in left shoulder. Breasts: On examination of the left breast, skin exam demonstrates erythematous rash over left axillary scar, and the area is slightly warm to the touch with underlying swelling, suggestive of seroma  +/- infection. A well healed lumpectomy scar noted along lateral aspect of areola on the left. On examination of the right breast, no palpable abnormalities in the breast or axilla. There is nipple inversion noted on the right side.   ECOG = 1  0 - Asymptomatic (Fully active, able to carry on all predisease activities without restriction)  1 - Symptomatic but completely ambulatory (Restricted in physically strenuous activity but ambulatory and able to carry out work of a light or sedentary nature. For example, light housework, office work)  2 - Symptomatic, <50% in bed during the day (Ambulatory and capable of all self care but unable to carry out any work activities. Up and about more than 50% of waking hours)  3 - Symptomatic, >50% in bed, but not bedbound (Capable of only limited self-care, confined to bed or chair 50% or more of waking hours)  4 - Bedbound (Completely disabled. Cannot carry on any self-care. Totally confined to bed or chair)  5 - Death   Eustace Pen MM, Creech RH, Tormey DC, et al. [phone]). "Toxicity and response criteria of the [facility]". [place] Oncol. 5 (6): 649-55   LABORATORY DATA:  Lab Results  Component Value Date   WBC 7.0 03/19/2016   HGB 11.0 (L) 03/19/2016   HCT 35.0 03/19/2016   MCV 86.8 03/19/2016   PLT 218 03/19/2016   CMP     Component Value Date/Time   NA 139 03/19/2016 0952   K 3.6 03/19/2016 0952   CL 102 09/03/2015 1542   CO2 24 03/19/2016  0952   GLUCOSE 180 (H) 03/19/2016 0952   BUN 17.7 03/19/2016 0952   CREATININE 0.8 03/19/2016 0952   CALCIUM 9.4 03/19/2016 0952   PROT 6.7 03/19/2016 0952   ALBUMIN 3.4 (L) 03/19/2016 0952   AST 16 03/19/2016 0952   ALT 17 03/19/2016 0952   ALKPHOS 173 (H) 03/19/2016 0952   BILITOT 0.43 03/19/2016 0952   GFRNONAA >60 09/03/2015 1542   GFRAA >60 09/03/2015 1542         RADIOGRAPHY: as above    IMPRESSION/PLAN:   Left Breast Invasive Ductal Carcinoma   It was a pleasure meeting the patient today. We discussed the risks, benefits, and side effects of radiotherapy. I recommend radiotherapy to the Left Breast and regional nodes to reduce her risk of locoregional recurrence by 2/3.  We discussed that radiation would take approximately 6 weeks to complete. We spoke about acute effects including skin irritation and fatigue as well as much less common late effects including internal organ injury or irritation. We spoke about the latest technology that is used to minimize the risk of late effects for patients undergoing radiotherapy to the breast or chest wall. No guarantees of treatment were given. The patient is enthusiastic about proceeding with treatment. A consent form was discussed, signed, and placed in the patient's chart. Because of concerns for infection at the patient's surgical site, I will wait for Dr. Ethlyn Gallery referral back to me to begin radiation treatments. At that time we will schedule the patient for CT Simulation and planning. I look forward to participating in the patient's care.   I encouraged the patient to discuss her bilateral lower extremity edema with Dr. Jana Hakim, as this swelling has gotten worse since chemotherapy. Additionally, I discussed referring the patient to a physical therapist to discuss her bilateral lower extremity edema and chronic foot discomfort. She is interested in this option in the future, but would like to postpone at this time. She reports a chronic  history of right pedal edema since bunion surgery and a physical therapist in breast clinic mentioned the possible utility of seeing an orthopedist at some point.  The patient would also like to delay that until after radiotherapy; she'll let me know when she is ready.  I spent 35 minutes face to face with the patient and more than 50% of that time was spent in counseling and/or coordination of care.   __________________________________________   Eppie Gibson, MD  This document serves as a record of services personally performed by Eppie Gibson, MD. It was created on her behalf by Maryla Morrow, a trained medical scribe. The creation of this record is based on the  scribe's personal observations and the provider's statements to them. This document has been checked and approved by the attending provider.

## 2016-03-22 ENCOUNTER — Other Ambulatory Visit: Payer: Self-pay | Admitting: Oncology

## 2016-03-23 ENCOUNTER — Encounter (HOSPITAL_BASED_OUTPATIENT_CLINIC_OR_DEPARTMENT_OTHER): Payer: Self-pay | Admitting: *Deleted

## 2016-03-24 ENCOUNTER — Ambulatory Visit: Payer: Self-pay | Admitting: General Surgery

## 2016-03-25 ENCOUNTER — Ambulatory Visit (HOSPITAL_BASED_OUTPATIENT_CLINIC_OR_DEPARTMENT_OTHER): Payer: BLUE CROSS/BLUE SHIELD | Admitting: Anesthesiology

## 2016-03-25 ENCOUNTER — Encounter (HOSPITAL_BASED_OUTPATIENT_CLINIC_OR_DEPARTMENT_OTHER): Payer: Self-pay

## 2016-03-25 ENCOUNTER — Encounter (HOSPITAL_BASED_OUTPATIENT_CLINIC_OR_DEPARTMENT_OTHER)
Admission: RE | Disposition: A | Discharge status: Home / Self Care | Payer: Self-pay | Source: Ambulatory Visit | Attending: General Surgery

## 2016-03-25 ENCOUNTER — Ambulatory Visit (HOSPITAL_BASED_OUTPATIENT_CLINIC_OR_DEPARTMENT_OTHER)
Admission: RE | Admit: 2016-03-25 | Discharge: 2016-03-25 | Disposition: A | Discharge status: Home / Self Care | Payer: BLUE CROSS/BLUE SHIELD | Source: Ambulatory Visit | Attending: General Surgery | Admitting: General Surgery

## 2016-03-25 DIAGNOSIS — E119 Type 2 diabetes mellitus without complications: Secondary | ICD-10-CM | POA: Diagnosis not present

## 2016-03-25 DIAGNOSIS — N6489 Other specified disorders of breast: Secondary | ICD-10-CM | POA: Diagnosis not present

## 2016-03-25 DIAGNOSIS — Z170 Estrogen receptor positive status [ER+]: Secondary | ICD-10-CM | POA: Diagnosis not present

## 2016-03-25 DIAGNOSIS — Z9221 Personal history of antineoplastic chemotherapy: Secondary | ICD-10-CM | POA: Insufficient documentation

## 2016-03-25 DIAGNOSIS — C50412 Malignant neoplasm of upper-outer quadrant of left female breast: Secondary | ICD-10-CM | POA: Diagnosis not present

## 2016-03-25 DIAGNOSIS — Z7984 Long term (current) use of oral hypoglycemic drugs: Secondary | ICD-10-CM | POA: Diagnosis not present

## 2016-03-25 DIAGNOSIS — Z7982 Long term (current) use of aspirin: Secondary | ICD-10-CM | POA: Diagnosis not present

## 2016-03-25 DIAGNOSIS — Z79899 Other long term (current) drug therapy: Secondary | ICD-10-CM | POA: Diagnosis not present

## 2016-03-25 LAB — GLUCOSE, CAPILLARY: Glucose-Capillary: 137 mg/dL — ABNORMAL HIGH (ref 65–99)

## 2016-03-25 SURGERY — CANCELLED PROCEDURE
Anesthesia: Monitor Anesthesia Care | Laterality: Left

## 2016-03-25 MED ORDER — EPHEDRINE 5 MG/ML INJ
INTRAVENOUS | Status: AC
  Filled 2016-03-25: qty 10

## 2016-03-25 MED ORDER — LIDOCAINE 2% (20 MG/ML) 5 ML SYRINGE
INTRAMUSCULAR | Status: AC
  Filled 2016-03-25: qty 5

## 2016-03-25 MED ORDER — ONDANSETRON HCL 4 MG/2ML IJ SOLN
INTRAMUSCULAR | Status: AC
  Filled 2016-03-25: qty 2

## 2016-03-25 MED ORDER — FENTANYL CITRATE (PF) 100 MCG/2ML IJ SOLN: 50.0000 ug | INTRAMUSCULAR | Status: DC | PRN

## 2016-03-25 MED ORDER — CHLORHEXIDINE GLUCONATE CLOTH 2 % EX PADS: 6.0000 | MEDICATED_PAD | Freq: Once | CUTANEOUS | Status: DC

## 2016-03-25 MED ORDER — LACTATED RINGERS IV SOLN
INTRAVENOUS | Status: DC
  Administered 2016-03-25: 10:00:00 via INTRAVENOUS

## 2016-03-25 MED ORDER — BUPIVACAINE HCL (PF) 0.25 % IJ SOLN
INTRAMUSCULAR | Status: AC
  Filled 2016-03-25: qty 60

## 2016-03-25 MED ORDER — DEXAMETHASONE SODIUM PHOSPHATE 10 MG/ML IJ SOLN
INTRAMUSCULAR | Status: AC
  Filled 2016-03-25: qty 1

## 2016-03-25 MED ORDER — CEFAZOLIN SODIUM-DEXTROSE 2-4 GM/100ML-% IV SOLN: 2.0000 g | INTRAVENOUS | Status: DC

## 2016-03-25 MED ORDER — MIDAZOLAM HCL 2 MG/2ML IJ SOLN: 1.0000 mg | INTRAMUSCULAR | Status: DC | PRN

## 2016-03-25 MED ORDER — SCOPOLAMINE 1 MG/3DAYS TD PT72: 1.0000 | MEDICATED_PATCH | Freq: Once | TRANSDERMAL | Status: DC | PRN

## 2016-03-25 MED ORDER — MIDAZOLAM HCL 2 MG/2ML IJ SOLN
INTRAMUSCULAR | Status: AC
  Filled 2016-03-25: qty 2

## 2016-03-25 MED ORDER — BUPIVACAINE-EPINEPHRINE (PF) 0.5% -1:200000 IJ SOLN
INTRAMUSCULAR | Status: AC
  Filled 2016-03-25: qty 60

## 2016-03-25 MED ORDER — ARTIFICIAL TEARS OP OINT
TOPICAL_OINTMENT | OPHTHALMIC | Status: AC
  Filled 2016-03-25: qty 7

## 2016-03-25 MED ORDER — FENTANYL CITRATE (PF) 100 MCG/2ML IJ SOLN
INTRAMUSCULAR | Status: AC
  Filled 2016-03-25: qty 2

## 2016-03-25 SURGICAL SUPPLY — 43 items
APPLIER CLIP 11 MED OPEN (CLIP)
BLADE SURG 15 STRL LF DISP TIS (BLADE) ×2 IMPLANT
BLADE SURG 15 STRL SS (BLADE) ×2
CANISTER SUCT 1200ML W/VALVE (MISCELLANEOUS) ×4 IMPLANT
CHLORAPREP W/TINT 26ML (MISCELLANEOUS) IMPLANT
CLIP APPLIE 11 MED OPEN (CLIP) IMPLANT
COVER BACK TABLE 60X90IN (DRAPES) ×4 IMPLANT
COVER MAYO STAND STRL (DRAPES) ×4 IMPLANT
DECANTER SPIKE VIAL GLASS SM (MISCELLANEOUS) IMPLANT
DERMABOND ADVANCED (GAUZE/BANDAGES/DRESSINGS)
DERMABOND ADVANCED .7 DNX12 (GAUZE/BANDAGES/DRESSINGS) IMPLANT
DRAIN CHANNEL 19F RND (DRAIN) IMPLANT
DRAIN HEMOVAC 1/8 X 5 (WOUND CARE) IMPLANT
DRAPE LAPAROSCOPIC ABDOMINAL (DRAPES) IMPLANT
DRAPE UTILITY XL STRL (DRAPES) ×4 IMPLANT
ELECT COATED BLADE 2.86 ST (ELECTRODE) ×4 IMPLANT
ELECT REM PT RETURN 9FT ADLT (ELECTROSURGICAL) ×4
ELECTRODE REM PT RTRN 9FT ADLT (ELECTROSURGICAL) ×2 IMPLANT
EVACUATOR SILICONE 100CC (DRAIN) IMPLANT
GLOVE BIO SURGEON STRL SZ7.5 (GLOVE) ×4 IMPLANT
GOWN STRL REUS W/ TWL LRG LVL3 (GOWN DISPOSABLE) ×4 IMPLANT
GOWN STRL REUS W/TWL LRG LVL3 (GOWN DISPOSABLE) ×4
NEEDLE HYPO 25X1 1.5 SAFETY (NEEDLE) ×4 IMPLANT
NS IRRIG 1000ML POUR BTL (IV SOLUTION) ×4 IMPLANT
PACK BASIN DAY SURGERY FS (CUSTOM PROCEDURE TRAY) ×4 IMPLANT
PENCIL BUTTON HOLSTER BLD 10FT (ELECTRODE) ×4 IMPLANT
SLEEVE SCD COMPRESS KNEE MED (MISCELLANEOUS) ×4 IMPLANT
SPONGE LAP 18X18 X RAY DECT (DISPOSABLE) ×4 IMPLANT
STAPLER VISISTAT 35W (STAPLE) IMPLANT
SUT ETHILON 3 0 PS 1 (SUTURE) IMPLANT
SUT MON AB 4-0 PC3 18 (SUTURE) IMPLANT
SUT VIC AB 3-0 54X BRD REEL (SUTURE) IMPLANT
SUT VIC AB 3-0 BRD 54 (SUTURE)
SUT VIC AB 3-0 SH 27 (SUTURE)
SUT VIC AB 3-0 SH 27X BRD (SUTURE) IMPLANT
SYR BULB 3OZ (MISCELLANEOUS) ×4 IMPLANT
SYR CONTROL 10ML LL (SYRINGE) ×4 IMPLANT
TOWEL OR 17X24 6PK STRL BLUE (TOWEL DISPOSABLE) ×4 IMPLANT
TOWEL OR NON WOVEN STRL DISP B (DISPOSABLE) ×4 IMPLANT
TRAY DSU PREP LF (CUSTOM PROCEDURE TRAY) IMPLANT
TUBE CONNECTING 20'X1/4 (TUBING) ×1
TUBE CONNECTING 20X1/4 (TUBING) ×3 IMPLANT
YANKAUER SUCT BULB TIP NO VENT (SUCTIONS) ×4 IMPLANT

## 2016-03-25 NOTE — Anesthesia Preprocedure Evaluation (Addendum)
Anesthesia Evaluation  Patient identified by MRN, date of birth, ID band Patient awake    Reviewed: Allergy & Precautions, NPO status , Patient's Chart, lab work & pertinent test results  Airway Mallampati: II  TM Distance: >3 FB Neck ROM: Full    Dental  (+) Teeth Intact, Dental Advisory Given   Pulmonary neg pulmonary ROS,    Pulmonary exam normal breath sounds clear to auscultation       Cardiovascular Exercise Tolerance: Good hypertension, Pt. on medications Normal cardiovascular exam Rhythm:Regular Rate:Normal  Echo 12/31/15: Study Conclusions  - Left ventricle: The cavity size was normal. There was mild concentric hypertrophy. Systolic function was normal. The estimated ejection fraction was in the range of 55% to 60%. Wall motion was normal; there were no regional wall motion abnormalities. Doppler parameters are consistent with abnormal left ventricular relaxation (grade 1 diastolic dysfunction). Global lateral strain: -17.6%. Prior -18.6% - Pericardium, extracardiac: A small pericardial effusion was identified posterior to the heart. There was no evidence of hemodynamic compromise.   Neuro/Psych Peripheral neuropathy--chemotherapy   negative psych ROS   GI/Hepatic Neg liver ROS, hiatal hernia, PUD, GERD  Medicated,  Endo/Other  diabetes, Type 2, Oral Hypoglycemic Agents  Renal/GU negative Renal ROS     Musculoskeletal  (+) Arthritis ,   Abdominal   Peds  Hematology  (+) Blood dyscrasia, anemia ,   Anesthesia Other Findings Day of surgery medications reviewed with the patient.  Left breast cancer  Reproductive/Obstetrics                            Anesthesia Physical Anesthesia Plan  ASA: II  Anesthesia Plan: MAC   Post-op Pain Management:    Induction: Intravenous  Airway Management Planned: Nasal Cannula  Additional Equipment:   Intra-op Plan:   Post-operative  Plan:   Informed Consent: I have reviewed the patients History and Physical, chart, labs and discussed the procedure including the risks, benefits and alternatives for the proposed anesthesia with the patient or authorized representative who has indicated his/her understanding and acceptance.   Dental advisory given  Plan Discussed with: CRNA and Anesthesiologist  Anesthesia Plan Comments: (Discussed risks/benefits/alternatives to MAC sedation including need for ventilatory support, hypotension, need for conversion to general anesthesia.  All patient questions answered.  Patient/guardian wishes to proceed.  CASE CANCELLED PER SURGEON)       Anesthesia Quick Evaluation

## 2016-03-25 NOTE — Interval H&P Note (Signed)
History and Physical Interval Note:  03/25/2016 11:55 AM  Kristine Parrish  has presented today for surgery, with the diagnosis of LEFT BREAST CANCER  The various methods of treatment have been discussed with the patient and family. After consideration of risks, benefits and other options for treatment, the patient has consented to  Procedure(s): DRAIN LEFT AXILLARY ABSCESS (Left) as a surgical intervention .  The patient's history has been reviewed, patient examined, no change in status, stable for surgery.  I have reviewed the patient's chart and labs.  Questions were answered to the patient's satisfaction.    The patient's condition had improved since her visit a couple days ago in clinic so her drain procedure was cancelled today. She will follow up with me in another couple days and continue her antibiotics   TOTH III,Gaven Eugene S

## 2016-03-25 NOTE — H&P (Signed)
Kristine Parrish  Location: [facility] Patient #: [id number] DOB: [birth year] Single / Language: [name] / Race: White Female   History of Present Illness The patient is a 71 year old female who presents for a follow-up for Breast cancer. The patient is a 71 year old white female who is about 4 weeks status post left breast lumpectomy and sentinel node mapping for a T1 cN1 a left breast cancer. She was ER/PR positive and HER-2 positive with a Ki-67 of 50%. She received neoadjuvant chemotherapy. Her course has been complicated by a infected seroma in the left axilla. I aspirated it last week and put her on doxycycline. She has had no improvement in the redness or swelling.   Allergies No Known Drug Allergies   Medication History  MetFORMIN HCl (500MG Tablet, Oral daily) Active. Potassium Chloride ER (10MEQ Capsule ER, Oral daily) Active. Gabapentin (800MG Tablet, Oral daily) Active. Premarin (0.45MG Tablet, Oral) Active. Aspirin (81MG Tablet DR, Oral) Active. Calcium 500 + D (500-125MG-UNIT Tablet, Oral) Active. Potassium Chloride (10MEQ Tablet ER, Oral daily) Active. Gabapentin (600MG Tablet, Oral daily) Active. HydroCHLOROthiazide (12.5MG Capsule, Oral daily) Active. Lovastatin (20MG Tablet, Oral daily) Active. Multiple Vitamin (1 (one) Oral daily) Active. Omeprazole (20MG Capsule DR, Oral daily) Active. Probiotic Daily (Oral daily) Active. Medications Reconciled  Vitals  Weight: 169.2 lb Height: 64.5in Body Surface Area: 1.83 m Body Mass Index: 28.59 kg/m  Temp.: 97.46F  Pulse: 106 (Regular)  BP: 142/88 (Sitting, Left Arm, Standard)       Physical Exam  General Mental Status-Alert. General Appearance-Consistent with stated age. Hydration-Well hydrated. Voice-Normal.  Head and Neck Head-normocephalic, atraumatic with no lesions or palpable masses. Trachea-midline. Thyroid Gland Characteristics - normal size  and consistency.  Eye Eyeball - Bilateral-Extraocular movements intact. Sclera/Conjunctiva - Bilateral-No scleral icterus.  Chest and Lung Exam Chest and lung exam reveals -quiet, even and easy respiratory effort with no use of accessory muscles and on auscultation, normal breath sounds, no adventitious sounds and normal vocal resonance. Inspection Chest Wall - Normal. Back - normal.  Breast Note: The left axillary incision is healing well. There is still significant swelling and redness associated with it. I was able to prep the area with ChloraPrep and infiltrated with 1% lidocaine. I aspirated 100 cc of cloudy fluid from the area and she tolerated this well. Previous cultures of this fluid were negative   Cardiovascular Cardiovascular examination reveals -normal heart sounds, regular rate and rhythm with no murmurs and normal pedal pulses bilaterally.  Abdomen Inspection Inspection of the abdomen reveals - No Hernias. Skin - Scar - no surgical scars. Palpation/Percussion Palpation and Percussion of the abdomen reveal - Soft, Non Tender, No Rebound tenderness, No Rigidity (guarding) and No hepatosplenomegaly. Auscultation Auscultation of the abdomen reveals - Bowel sounds normal.  Neurologic Neurologic evaluation reveals -alert and oriented x 3 with no impairment of recent or remote memory. Mental Status-Normal.  Musculoskeletal Normal Exam - Left-Upper Extremity Strength Normal and Lower Extremity Strength Normal. Normal Exam - Right-Upper Extremity Strength Normal and Lower Extremity Strength Normal.  Lymphatic Head & Neck  General Head & Neck Lymphatics: Bilateral - Description - Normal. Axillary  General Axillary Region: Bilateral - Description - Normal. Tenderness - Non Tender. Femoral & Inguinal  Generalized Femoral & Inguinal Lymphatics: Bilateral - Description - Normal. Tenderness - Non Tender.    Assessment & Plan  BREAST CANCER OF  UPPER-OUTER QUADRANT OF LEFT FEMALE BREAST (C50.412) Impression: The patient is about 4 weeks status post left breast lumpectomy  for breast cancer. Her course has been complicated by an infected seroma in the left axilla. I was able to aspirate it today. I will switch her from doxycycline to Keflex since she has not really made any progress in the last week. If this does not improve over the weekend and I will plan to take her to the operating room next week to drain the area. I have discussed with her in detail the risks and benefits as well as some of the technical aspects and she understands and wishes to proceed Current Plans Started Keflex 500MG, 1 (one) Capsule four times daily, #28, 03/20/2016, Ref. x1.

## 2016-04-09 ENCOUNTER — Other Ambulatory Visit (HOSPITAL_BASED_OUTPATIENT_CLINIC_OR_DEPARTMENT_OTHER): Payer: BLUE CROSS/BLUE SHIELD

## 2016-04-09 ENCOUNTER — Ambulatory Visit (HOSPITAL_BASED_OUTPATIENT_CLINIC_OR_DEPARTMENT_OTHER): Payer: BLUE CROSS/BLUE SHIELD

## 2016-04-09 ENCOUNTER — Ambulatory Visit (HOSPITAL_BASED_OUTPATIENT_CLINIC_OR_DEPARTMENT_OTHER): Payer: BLUE CROSS/BLUE SHIELD | Admitting: Oncology

## 2016-04-09 ENCOUNTER — Encounter: Payer: Self-pay | Admitting: *Deleted

## 2016-04-09 VITALS — BP 155/74 | HR 93 | Temp 97.6°F | Resp 18 | Ht 64.5 in | Wt 169.9 lb

## 2016-04-09 DIAGNOSIS — Z5112 Encounter for antineoplastic immunotherapy: Secondary | ICD-10-CM | POA: Diagnosis not present

## 2016-04-09 DIAGNOSIS — C50412 Malignant neoplasm of upper-outer quadrant of left female breast: Secondary | ICD-10-CM

## 2016-04-09 DIAGNOSIS — E119 Type 2 diabetes mellitus without complications: Secondary | ICD-10-CM | POA: Diagnosis not present

## 2016-04-09 DIAGNOSIS — M79671 Pain in right foot: Secondary | ICD-10-CM

## 2016-04-09 DIAGNOSIS — C773 Secondary and unspecified malignant neoplasm of axilla and upper limb lymph nodes: Secondary | ICD-10-CM

## 2016-04-09 DIAGNOSIS — Z170 Estrogen receptor positive status [ER+]: Secondary | ICD-10-CM

## 2016-04-09 LAB — COMPREHENSIVE METABOLIC PANEL
ALT: 24 U/L (ref 0–55)
AST: 21 U/L (ref 5–34)
Albumin: 3.5 g/dL (ref 3.5–5.0)
Alkaline Phosphatase: 156 U/L — ABNORMAL HIGH (ref 40–150)
Anion Gap: 10 mEq/L (ref 3–11)
BUN: 10.6 mg/dL (ref 7.0–26.0)
CO2: 27 mEq/L (ref 22–29)
Calcium: 9.5 mg/dL (ref 8.4–10.4)
Chloride: 101 mEq/L (ref 98–109)
Creatinine: 0.9 mg/dL (ref 0.6–1.1)
EGFR: 68 mL/min/{1.73_m2} — ABNORMAL LOW (ref >90)
Glucose: 250 mg/dl — ABNORMAL HIGH (ref 70–140)
Potassium: 3.9 mEq/L (ref 3.5–5.1)
Sodium: 138 mEq/L (ref 136–145)
Total Bilirubin: 0.39 mg/dL (ref 0.20–1.20)
Total Protein: 6.8 g/dL (ref 6.4–8.3)

## 2016-04-09 LAB — CBC WITH DIFFERENTIAL/PLATELET
BASO%: 0.8 % (ref 0.0–2.0)
Basophils Absolute: 0 10*3/uL (ref 0.0–0.1)
EOS%: 7.8 % — ABNORMAL HIGH (ref 0.0–7.0)
Eosinophils Absolute: 0.5 10*3/uL (ref 0.0–0.5)
HCT: 34.2 % — ABNORMAL LOW (ref 34.8–46.6)
HGB: 11 g/dL — ABNORMAL LOW (ref 11.6–15.9)
LYMPH%: 16.3 % (ref 14.0–49.7)
MCH: 26.8 pg (ref 25.1–34.0)
MCHC: 32 g/dL (ref 31.5–36.0)
MCV: 83.7 fL (ref 79.5–101.0)
MONO#: 0.4 10*3/uL (ref 0.1–0.9)
MONO%: 6.9 % (ref 0.0–14.0)
NEUT#: 4.1 10*3/uL (ref 1.5–6.5)
NEUT%: 68.2 % (ref 38.4–76.8)
Platelets: 258 10*3/uL (ref 145–400)
RBC: 4.09 10*6/uL (ref 3.70–5.45)
RDW: 15.8 % — ABNORMAL HIGH (ref 11.2–14.5)
WBC: 6 10*3/uL (ref 3.9–10.3)
lymph#: 1 10*3/uL (ref 0.9–3.3)

## 2016-04-09 MED ORDER — ACETAMINOPHEN 500 MG PO TABS
ORAL_TABLET | ORAL | Status: AC
  Filled 2016-04-09: qty 2

## 2016-04-09 MED ORDER — HEPARIN SOD (PORK) LOCK FLUSH 100 UNIT/ML IV SOLN
500.0000 [IU] | Freq: Once | INTRAVENOUS | Status: AC | PRN
  Administered 2016-04-09: 500 [IU]
  Filled 2016-04-09: qty 5

## 2016-04-09 MED ORDER — SODIUM CHLORIDE 0.9 % IV SOLN
Freq: Once | INTRAVENOUS | Status: AC
  Administered 2016-04-09: 10:00:00 via INTRAVENOUS

## 2016-04-09 MED ORDER — ACETAMINOPHEN 325 MG PO TABS
650.0000 mg | ORAL_TABLET | Freq: Once | ORAL | Status: AC
  Administered 2016-04-09: 650 mg via ORAL

## 2016-04-09 MED ORDER — DIPHENHYDRAMINE HCL 25 MG PO CAPS
25.0000 mg | ORAL_CAPSULE | Freq: Once | ORAL | Status: AC
  Administered 2016-04-09: 25 mg via ORAL

## 2016-04-09 MED ORDER — ACETAMINOPHEN 325 MG PO TABS
ORAL_TABLET | ORAL | Status: AC
  Filled 2016-04-09: qty 2

## 2016-04-09 MED ORDER — TRASTUZUMAB CHEMO 150 MG IV SOLR
6.0000 mg/kg | Freq: Once | INTRAVENOUS | Status: AC
  Administered 2016-04-09: 462 mg via INTRAVENOUS
  Filled 2016-04-09: qty 22

## 2016-04-09 MED ORDER — TRAMADOL HCL 50 MG PO TABS: 50.0000 mg | ORAL_TABLET | Freq: Four times a day (QID) | ORAL | 3 refills | Status: DC | PRN

## 2016-04-09 MED ORDER — DIPHENHYDRAMINE HCL 25 MG PO CAPS
ORAL_CAPSULE | ORAL | Status: AC
  Filled 2016-04-09: qty 1

## 2016-04-09 MED ORDER — SODIUM CHLORIDE 0.9% FLUSH
10.0000 mL | INTRAVENOUS | Status: DC | PRN
  Administered 2016-04-09: 10 mL
  Filled 2016-04-09: qty 10

## 2016-04-09 NOTE — Progress Notes (Signed)
[place]  [phone] [phone]     ID: Kristine Parrish DOB: Jun 11, 1945  MR#: [id number]  GXQ#:[id number]  Patient Care Team: Kristine Melter, MD as PCP - General (Family Medicine) Kristine Messing III, MD as Consulting Physician (General Surgery) Kristine Cruel, MD as Consulting Physician (Oncology) Kristine Gibson, MD as Attending Physician (Radiation Oncology) Kristine Donning, MD (Dermatology) Kristine Spates, MD as Consulting Physician (Gastroenterology) Kristine Parrish, DPM as Consulting Physician (Podiatry) Kristine Gilmore Evette Cristal, MD as Consulting Physician (Obstetrics and Gynecology) Kristine Dresser, MD as Consulting Physician (Cardiology) Kristine Norway, RN as Registered Nurse PCP: Kristine Melter, MD OTHER MD:  CHIEF COMPLAINT: Estrogen receptor positive breast cancer  CURRENT TREATMENT: anti-HER-2 immunotherapy; adjuvant radiation pending   BREAST CANCER HISTORY: From the original intake note  Kristine Parrish herself noted a change in her left breast and brought it to the attention of Dr. Nori Parrish, who set her up for bilateral diagnostic mammography with tomography and left breast ultrasonography at the [facility] 08/09/2015. The breast density was category D. In the area of palpable concern there was a mass with irregular margins and architectural distortion, measuring approximately 3 cm. This was palpable in the upper outer quadrant near the nipple. The ultrasound confirmed an irregular hypoechoic mass approximately 3 cm from the nipple at the 10:00 position measuring 2.7 cm. Ultrasound of the left axilla found a single level I left axillary node with focal cortical thickening.  On 08/13/2015 the patient underwent biopsy of the left breast mass and the suspicious left axillary lymph node, both showing invasive ductal carcinoma, grade 2 or 3, estrogen receptor 100% positive, progesterone receptor 100% positive, both with strong staining intensity, with an MIB-1 of 50%,  and HER-2 amplification, the signals ratio being 2.2 to and the number per cell 3.55  The patient's subsequent history is as detailed below  INTERVAL HISTORY: Kristine Parrish returns today for follow-up of her triple positive breast cancer. The interval history is significant for having had continuing problems with a left axillary seroma. In fact she was set up for surgical marsupialization but this was aborted since the problem was getting better with drainage and antibiotics. She has had a couple of drainage procedures and has 1 more scheduled for next week after which hopefully she will be able to proceed to radiation.  It turns out she will not be able to participate in the PALLAS trial because she is HER-2 positive.  She is receiving trastuzumab today and will continue to receive it every 3 weeks until she completes a year. We are not adding Pertuzumab giving the problems she had with this drug in the past. She is due for a repeat echo this month.   REVIEW OF SYSTEMS: Kristine Parrish continues to have significant problems with her right foot. This of course is the foot where she had prior podiatric surgery. She says her podiatrist has told her this surgery was not the reason for her ongoing pain, but there really is no other reason that the podiatrist can think of or anyone else can think of. She is treating this with nonsteroidals. Aside from this issue she has occasional problems with shortness of breath when walking upstairs, and she worries about her blood sugars although she says they're currently well-controlled. A detailed review of systems today was otherwise stable.  PAST MEDICAL HISTORY: Past Medical History:  Diagnosis Date  . Arthritis    "hands "  . Breast cancer of upper-outer quadrant of left female breast (  Kristine) 08/15/2015  . Colon polyps   . Dyspnea    with exertion- "from chemo"  . GERD (gastroesophageal reflux disease)   . Hiatal hernia   . History of bronchitis   . Hypertension   .  Neuropathy ([facility])    from chemopathy  . Peptic ulcer disease   . Peripheral neuropathy ([facility])   . Type 2 diabetes mellitus ([place])    Type II    PAST SURGICAL HISTORY: Past Surgical History:  Procedure Laterality Date  . APPENDECTOMY  1974  . cataract surgery Bilateral 04/2012  . CHOLECYSTECTOMY  1996  . COLONOSCOPY    . FOOT SURGERY     left bunion removed  . nerve removed from left foot  2000  . PORTACATH PLACEMENT N/A 09/05/2015   Procedure: INSERTION PORT-A-CATH;  Surgeon: Kristine Messing III, MD;  Location: [facility];  Service: General;  Laterality: N/A;  . RADIOACTIVE SEED GUIDED PARTIAL MASTECTOMY/AXILLARY SENTINEL NODE BIOPSY/AXILLARY NODE DISSECTION Left 02/14/2016   Procedure: LEFT BREAST RADIOACTIVE SEED GUIDED LUMPECTOMY WITH LEFT  SENTINEL LYMPH NODE BIOPSY AND LEFT  SEED TARGETED DISSECTION;  Surgeon: Kristine Messing III, MD;  Location: [facility];  Service: General;  Laterality: Left;  . TUBAL LIGATION    . VAGINAL HYSTERECTOMY  1995    FAMILY HISTORY Family History  Problem Relation Age of Onset  . Diabetes Mother   . Hypertension Mother   . Alcoholism Maternal Grandfather   . Congestive Heart Failure Maternal Grandmother   The patient has little information about her father and is not sure of his cause of death or age at death. The patient's mother died at age 90. The patient had no siblings. She was raised by her grandmother. She is not aware of any breast or ovarian cancer history in the family   GYNECOLOGIC HISTORY:  No LMP recorded. Patient has had a hysterectomy.  menarche age 19, first live birth age 46. The patient is GX P2. She underwent hysterectomy with bilateral salpingo-oophorectomy in 1994, and has been on estrogen replacement since that time, discontinued June 2017.   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 Daughter Kristine Parrish lives in [place] and works as a Teacher, music  for the  [employer]. Son Kristine Parrish lives in [place] where he is Secondary school teacher. The patient had one grandchild. She attends [employer]. The patient's daughter Kristine Parrish is her healthcare power of attorney.     HEALTH MAINTENANCE: Social History  Substance Use Topics  . Smoking status: Never Smoker  . Smokeless tobacco: Never Used  . Alcohol use No     Colonoscopy: 2012/[facility]  PAP:  Bone density: 2015/"normal" at Dr. Verlon Au  Lipid panel:  Allergies  Allergen Reactions  . No Known Allergies     Current Outpatient Prescriptions  Medication Sig Dispense Refill  . aspirin 81 MG tablet Take 81 mg by mouth daily.    Marland Kitchen doxycycline (VIBRAMYCIN) 100 MG capsule Take 100 mg by mouth 2 (two) times daily.    Marland Kitchen gabapentin (NEURONTIN) 800 MG tablet Take 800 mg by mouth 3 (three) times daily.    . hydrochlorothiazide (MICROZIDE) 12.5 MG capsule Take 12.5 mg by mouth daily.    Marland Kitchen lovastatin (MEVACOR) 20 MG tablet Take 20 mg by mouth at bedtime.    . metFORMIN (GLUCOPHAGE) 500 MG tablet Take 500  mg by mouth 2 (two) times daily with a meal.     . Multiple Vitamin (MULTIVITAMIN) tablet Take 1 tablet by mouth daily.    Marland Kitchen omeprazole (PRILOSEC) 20 MG capsule Take 20 mg by mouth daily.    . potassium chloride (MICRO-K) 10 MEQ CR capsule TAKE 2 CAPSULES (20 MEQ TOTAL) BY MOUTH DAILY. 60 capsule 0  . Probiotic Product (PROBIOTIC DAILY PO) Take by mouth. Reported on 08/21/2015    . traMADol (ULTRAM) 50 MG tablet Take 1 tablet (50 mg total) by mouth every 6 (six) hours as needed. 60 tablet 3   No current facility-administered medications for this visit.     OBJECTIVE: Middle-aged white woman Who appears stated age  81:   04/09/16 0838  BP: (!) 155/74  Pulse: 93  Resp: 18  Temp: 97.6 F (36.4 C)     Body mass index is 28.71 kg/m.    ECOG FS:1 - Symptomatic but completely ambulatory  There is about an inch long and  salt-and-pepper Sclerae unicteric, EOMs intact Oropharynx clear and moist No cervical or supraclavicular adenopathy Lungs no rales or rhonchi Heart regular rate and rhythm Abd soft, nontender, positive bowel sounds MSK no focal spinal tenderness, no upper extremity lymphedema Neuro: nonfocal, well oriented, appropriate affect Breasts: The right breast is unremarkable. The left breast is status Parrish lumpectomy. The cosmetic result is excellent. There is no evidence of local recurrence. In the left axilla the area of seroma is smaller and less erythematous. There is no dehiscence.   LAB RESULTS:  CMP     Component Value Date/Time   NA 139 03/19/2016 0952   K 3.6 03/19/2016 0952   CL 102 09/03/2015 1542   CO2 24 03/19/2016 0952   GLUCOSE 180 (H) 03/19/2016 0952   BUN 17.7 03/19/2016 0952   CREATININE 0.8 03/19/2016 0952   CALCIUM 9.4 03/19/2016 0952   PROT 6.7 03/19/2016 0952   ALBUMIN 3.4 (L) 03/19/2016 0952   AST 16 03/19/2016 0952   ALT 17 03/19/2016 0952   ALKPHOS 173 (H) 03/19/2016 0952   BILITOT 0.43 03/19/2016 0952   GFRNONAA >60 09/03/2015 1542   GFRAA >60 09/03/2015 1542    INo results found for: SPEP, UPEP  Lab Results  Component Value Date   WBC 6.0 04/09/2016   NEUTROABS 4.1 04/09/2016   HGB 11.0 (L) 04/09/2016   HCT 34.2 (L) 04/09/2016   MCV 83.7 04/09/2016   PLT 258 04/09/2016      Chemistry      Component Value Date/Time   NA 139 03/19/2016 0952   K 3.6 03/19/2016 0952   CL 102 09/03/2015 1542   CO2 24 03/19/2016 0952   BUN 17.7 03/19/2016 0952   CREATININE 0.8 03/19/2016 0952      Component Value Date/Time   CALCIUM 9.4 03/19/2016 0952   ALKPHOS 173 (H) 03/19/2016 0952   AST 16 03/19/2016 0952   ALT 17 03/19/2016 0952   BILITOT 0.43 03/19/2016 0952       No results found for: LABCA2  No components found for: KYHCW237  No results for input(s): INR in the last 168 hours.  Urinalysis No results found for: COLORURINE, APPEARANCEUR,  LABSPEC, PHURINE, GLUCOSEU, HGBUR, BILIRUBINUR, KETONESUR, PROTEINUR, UROBILINOGEN, NITRITE, LEUKOCYTESUR  STUDIES: No results found.  ELIGIBLE FOR AVAILABLE RESEARCH PROTOCOL: Not eligible for PALLAS because HER-2 positive  ASSESSMENT: 71 y.o. [place] woman status Parrish left breast upper outer quadrant and left axillary lymph node biopsy 08/13/2015 both positive for an invasive  ductal carcinoma, grade 2 or 3, triple positive, with an MIB-1 of 50%  (1) neoadjuvant chemotherapy consisting of carboplatin, docetaxel, trastuzumab and pertuzumab given every 21 days Started 09/11/2015  (a) treatment changed to Abraxane and trastuzumab with cycle 2 because of side effects after cycle 1  (b) Abraxane discontinued after 3d dose (10/17/2015) because of peripheral neuropathy  (c) cyclophosphamide, methotrexate and fluorouracil (CMF) started 11/07/2015, 4 cycles, last dose 01/09/2016   (2) trastuzumab will be continued to complete a year (Through June 2018).  (a) most recent echocardiogram 12/31/2015 showed an ejection fraction of 55-60%   (3) status Parrish left lumpectomy with TAD 02/14/2016 showing a residual pT1c pN1 invasive ductal carcinoma, grade 2, estrogen and progesterone receptor positive, now HER-2 not amplified; margins were negative  (a) left axillary seroma/abscess drained surgically 03/25/2016  (4) adjuvant radiation pending  (5) anti-estrogens to follow at the completion of local treatment, with consideration of the PALLAS trial  PLAN: I spent approximately 30 minutes with Kristine Parrish with most of that time spent discussing her multiple problems.  Faylynn will proceed to trastuzumab treatment today. She will continue these of course through June of this year. I have sent the cardio oncology group a note so they can proceed to echocardiogram and routine follow-up through their group as well.  We are going to not proceed to thyroid evaluation and orthopedic referral for the right foot problem  in case she N setup in a boot and can't drive, which would be a major issue as far as her radiation is concerned. I am starting her on tramadol. We discussed the possible toxicities side effects and complications of that agent. She will use it alternating with nonsteroidals so that she does not experience the fall side effects from either group of drugs. Of course if the foot problem gets worse we will refer.  I'm hopeful after her drainage procedure next week she will be able to proceed to radiation.  She will see me again mid March. At that point we should be able to start her on anti-estrogens.  She knows to call for any other issues that may develop before the next visit here.  Kristine Cruel, MD   04/09/2016 9:01 AM Medical Oncology and Hematology [place]

## 2016-04-09 NOTE — Patient Instructions (Signed)
[facility] Discharge Instructions for Patients Receiving Chemotherapy  Today you received the following chemotherapy agents: Herceptin   To help prevent nausea and vomiting after your treatment, we encourage you to take your nausea medication as directed.    If you develop nausea and vomiting that is not controlled by your nausea medication, call the clinic.   BELOW ARE SYMPTOMS THAT SHOULD BE REPORTED IMMEDIATELY:  *FEVER GREATER THAN 100.5 F  *CHILLS WITH OR WITHOUT FEVER  NAUSEA AND VOMITING THAT IS NOT CONTROLLED WITH YOUR NAUSEA MEDICATION  *UNUSUAL SHORTNESS OF BREATH  *UNUSUAL BRUISING OR BLEEDING  TENDERNESS IN MOUTH AND THROAT WITH OR WITHOUT PRESENCE OF ULCERS  *URINARY PROBLEMS  *BOWEL PROBLEMS  UNUSUAL RASH Items with * indicate a potential emergency and should be followed up as soon as possible.  Feel free to call the clinic you have any questions or concerns. The clinic phone number is [phone].  Please show the CHEMO ALERT CARD at check-in to the Emergency Department and triage nurse.   

## 2016-04-09 NOTE — Progress Notes (Signed)
04/09/2016 Met with patient in infusion area today, having previously been identified as a potential candidate for the AFT-05 PALLAS study. Upon further review of eligibility, Dr. Jana Hakim was notified that the patient did not meet criteria for enrollment, due to the presence of Her-2 positive disease. This was discussed with the patient by Dr. Jana Hakim today. Thanked patient for her interest in the trial, and noted that she could be considered for other trials in the future, if any potential studies are available to her. Cindy S. Brigitte Pulse BSN, RN, [place] 04/09/2016 11:44 AM

## 2016-04-23 ENCOUNTER — Ambulatory Visit (HOSPITAL_BASED_OUTPATIENT_CLINIC_OR_DEPARTMENT_OTHER)
Admission: RE | Admit: 2016-04-23 | Discharge: 2016-04-23 | Disposition: A | Discharge status: Home / Self Care | Payer: BLUE CROSS/BLUE SHIELD | Source: Ambulatory Visit | Attending: Internal Medicine | Admitting: Internal Medicine

## 2016-04-23 ENCOUNTER — Ambulatory Visit (HOSPITAL_COMMUNITY)
Admission: RE | Admit: 2016-04-23 | Discharge: 2016-04-23 | Disposition: A | Discharge status: Home / Self Care | Payer: BLUE CROSS/BLUE SHIELD | Source: Ambulatory Visit | Attending: Family Medicine | Admitting: Family Medicine

## 2016-04-23 VITALS — BP 158/98 | HR 96 | Ht 64.5 in | Wt 170.0 lb

## 2016-04-23 DIAGNOSIS — M7989 Other specified soft tissue disorders: Secondary | ICD-10-CM

## 2016-04-23 DIAGNOSIS — C50412 Malignant neoplasm of upper-outer quadrant of left female breast: Secondary | ICD-10-CM

## 2016-04-23 DIAGNOSIS — I5189 Other ill-defined heart diseases: Secondary | ICD-10-CM | POA: Insufficient documentation

## 2016-04-23 DIAGNOSIS — I313 Pericardial effusion (noninflammatory): Secondary | ICD-10-CM | POA: Insufficient documentation

## 2016-04-23 DIAGNOSIS — Z170 Estrogen receptor positive status [ER+]: Secondary | ICD-10-CM | POA: Diagnosis not present

## 2016-04-23 DIAGNOSIS — I10 Essential (primary) hypertension: Secondary | ICD-10-CM

## 2016-04-23 DIAGNOSIS — I3139 Other pericardial effusion (noninflammatory): Secondary | ICD-10-CM

## 2016-04-23 MED ORDER — SPIRONOLACTONE 25 MG PO TABS: 12.5000 mg | ORAL_TABLET | Freq: Every day | ORAL | 3 refills | Status: DC

## 2016-04-23 NOTE — Progress Notes (Signed)
Advanced Heart Failure Medication Review by a Pharmacist  Does the patient  feel that his/her medications are working for him/her?  yes  Has the patient been experiencing any side effects to the medications prescribed?  no  Does the patient measure his/her own blood pressure or blood glucose at home?  yes   Does the patient have any problems obtaining medications due to transportation or finances?   no  Understanding of regimen: good Understanding of indications: good Potential of compliance: good Patient understands to avoid NSAIDs. Patient understands to avoid decongestants.  Issues to address at subsequent visits: None   Pharmacist comments: Kristine Parrish is a pleasant 71 yo F presenting without a medication list but with good recall of her regimen. She reports good compliance with her regimen and did not have any specific medication-related questions or concerns for me at this time.   [name]. Velva Harman, PharmD, BCPS, CPP Clinical Pharmacist Pager: [phone] Phone: [phone] 04/23/2016 11:56 AM      Time with patient: 10 minutes Preparation and documentation time: 2 minutes Total time: 12 minutes

## 2016-04-23 NOTE — Progress Notes (Signed)
  Echocardiogram 2D Echocardiogram has been performed.  Tresa Res 04/23/2016, 10:33 AM

## 2016-04-23 NOTE — Patient Instructions (Signed)
Will get lower extremity ultrasounds on legs for swelling at [facility] (same check in as today's appointment).  _________________________________________________________________  _________________________________________________________________  Follow up 3 months with echocardiogram and appointment with Dr. Haroldine Laws.  Do the following things EVERYDAY: 1) Weigh yourself in the morning before breakfast. Write it down and keep it in a log. 2) Take your medicines as prescribed 3) Eat low salt foods-Limit salt (sodium) to 2000 mg per day.  4) Stay as active as you can everyday 5) Limit all fluids for the day to less than 2 liters

## 2016-04-23 NOTE — Progress Notes (Signed)
CARDIO-ONCOLOGY CONSULT NOTE  Referring Physician: Magrinat Primary Care: Christella Noa Primary Cardiologist: New (Bensimhon)  HPI:  Kristine Parrish is a 71 y/o female with history DM2, HTN, OA and left breast CA (triple positive). Referred by Dr. Jana Hakim for enrollment into the cardio-oncology clinic program.  Denies any h/o known heart disease although mother had strong h/o heart disease/HF.  Found to have triple + left breast CA in 6/17. Biopsy showed invasive ductal carcinoma, grade 2 or 3, estrogen receptor 100% positive, progesterone receptor 100% positive, both with strong staining intensity, with an MIB-1 of 50%, and HER-2 amplification, the signals ratio being 2.2 to and the number per cell 3.55  Treatment plan as follows;  (1) neoadjuvant chemotherapy consisting of carboplatin, docetaxel, trastuzumab and pertuzumab given every 21 days Started 09/11/2015             (a) treatment changed to Abraxane and trastuzumab with cycle 2 because of side effects after cycle 1             (b) Abraxane discontinued after 3d dose (10/17/2015) because of peripheral neuropathy             (c) cyclophosphamide, methotrexate and fluorouracil (CMF) started 11/07/2015, 4 cycles, last dose 01/09/2016   (2) trastuzumab will be continued to complete a year (Through June 2018).             (a) most recent echocardiogram 12/31/2015 showed an ejection fraction of 55-60%   (3) status post left lumpectomy with TAD 02/14/2016 showing a residual pT1c pN1 invasive ductal carcinoma, grade 2, estrogen and progesterone receptor positive, now HER-2 not amplified; margins were negative             (a) left axillary seroma/abscess drained surgically 03/25/2016  (4) adjuvant radiation pending  (5) anti-estrogens to follow at the completion of local treatment, with consideration of the PALLAS trial  Has finished first rounds of chemo and now on Herceptin only. Tolerating Herceptin well. Used to be very active but  broke R foot in 10/16 and hasn't been able to walk well on it since that time. Still working full time. No CP, orthopnea or PND. + edema BP has been high recently.  Echo 04/23/16 (reviewed personally) EF 60-65% Grade 1 DD. GLS -18.4% LS' 8.4cm   Review of Systems: [y] = yes, [ ]  = no   General: Weight gain [ ] ; Weight loss [ ] ; Anorexia [ ] ; Fatigue [ ] ; Fever [ ] ; Chills [ ] ; Weakness [ ]   Cardiac: Chest pain/pressure [ ] ; Resting SOB [ ] ; Exertional SOB [ ] ; Orthopnea [ ] ; Pedal Edema [ ] ; Palpitations [ ] ; Syncope [ ] ; Presyncope [ ] ; Paroxysmal nocturnal dyspnea[ ]   Pulmonary: Cough [ ] ; Wheezing[ ] ; Hemoptysis[ ] ; Sputum [ ] ; Snoring [ ]   GI: Vomiting[ ] ; Dysphagia[ ] ; Melena[ ] ; Hematochezia [ ] ; Heartburn[ ] ; Abdominal pain [ ] ; Constipation [ ] ; Diarrhea [ ] ; BRBPR [ ]   GU: Hematuria[ ] ; Dysuria [ ] ; Nocturia[ ]   Vascular: Pain in legs with walking Blue.Reese ]; Pain in feet with lying flat [ ] ; Non-healing sores [ ] ; Stroke [ ] ; TIA [ ] ; Slurred speech [ ] ;  Neuro: Headaches[ ] ; Vertigo[ ] ; Seizures[ ] ; Paresthesias[ ] ;Blurred vision [ ] ; Diplopia [ ] ; Vision changes [ ]   Ortho/Skin: Arthritis Blue.Reese ]; Joint pain [ ] ; Muscle pain [ ] ; Joint swelling [ ] ; Back Pain [ ] ; Rash [ ]   Psych: Depression[ ] ; Anxiety[ ]   Heme: Bleeding problems [ ] ;  Clotting disorders [ ] ; Anemia [ ]   Endocrine: Diabetes [ y]; Thyroid dysfunction[ ]    Past Medical History:  Diagnosis Date  . Arthritis    "hands "  . Breast cancer of upper-outer quadrant of left female breast ([place]) 08/15/2015  . Colon polyps   . Dyspnea    with exertion- "from chemo"  . GERD (gastroesophageal reflux disease)   . Hiatal hernia   . History of bronchitis   . Hypertension   . Neuropathy ([place])    from chemopathy  . Peptic ulcer disease   . Peripheral neuropathy ([place])   . Type 2 diabetes mellitus ([facility])    Type II    Current Outpatient Prescriptions  Medication Sig Dispense Refill  . aspirin 81 MG tablet Take 81 mg by mouth daily.     Marland Kitchen doxycycline (VIBRAMYCIN) 100 MG capsule Take 100 mg by mouth 2 (two) times daily.    Marland Kitchen gabapentin (NEURONTIN) 800 MG tablet Take 800 mg by mouth 3 (three) times daily.    . hydrochlorothiazide (MICROZIDE) 12.5 MG capsule Take 12.5 mg by mouth daily.    Marland Kitchen lovastatin (MEVACOR) 20 MG tablet Take 20 mg by mouth at bedtime.    . metFORMIN (GLUCOPHAGE) 500 MG tablet Take 500 mg by mouth 2 (two) times daily with a meal.     . Multiple Vitamin (MULTIVITAMIN) tablet Take 1 tablet by mouth daily.    Marland Kitchen omeprazole (PRILOSEC) 20 MG capsule Take 20 mg by mouth daily.    . potassium chloride (MICRO-K) 10 MEQ CR capsule TAKE 2 CAPSULES (20 MEQ TOTAL) BY MOUTH DAILY. 60 capsule 0  . Probiotic Product (PROBIOTIC DAILY PO) Take 1 tablet by mouth daily. Reported on 08/21/2015    . traMADol (ULTRAM) 50 MG tablet Take 1 tablet (50 mg total) by mouth every 6 (six) hours as needed. 60 tablet 3   No current facility-administered medications for this encounter.     Allergies  Allergen Reactions  . No Known Allergies       Social History   Social History  . Marital status: Widowed    Spouse name: N/A  . Number of children: 2  . Years of education: 12   Occupational History  . Not on file.   Social History Main Topics  . Smoking status: Never Smoker  . Smokeless tobacco: Never Used  . Alcohol use No  . Drug use: No  . Sexual activity: Not Currently   Other Topics Concern  . Not on file   Social History Narrative   Patient is divorced.   Patient has two children   Patient works in a clerical position.   Patient has a high school education.   Patient drinks caffeine rarely.      Family History  Problem Relation Age of Onset  . Diabetes Mother   . Hypertension Mother   . Alcoholism Maternal Grandfather   . Congestive Heart Failure Maternal Grandmother     Vitals:   04/23/16 1229  BP: (!) 158/98  Pulse: 96  Weight: 170 lb (77.1 kg)  Height: 5' 4.5" (1.638 m)    PHYSICAL  EXAM: General:  Well appearing. No respiratory difficulty HEENT: normal Neck: supple. no JVD. Carotids 2+ bilat; no bruits. No lymphadenopathy or thryomegaly appreciated. Cor: PMI nondisplaced. Regular rate & rhythm. No rubs, gallops or murmurs. Lungs: clear Abdomen: soft, nontender, nondistended. No hepatosplenomegaly. No bruits or masses. Good bowel sounds. Extremities: no cyanosis, clubbing, rash, 2+ edema on L tr-1+ on  right Neuro: alert & oriented x 3, cranial nerves grossly intact. moves all 4 extremities w/o difficulty. Affect pleasant.  ASSESSMENT & PLAN:  1. Breast CA, left --Explained incidence of Herceptin cardiotoxicity and role of Cardio-oncology clinic at length. Echo images reviewed personally. All parameters stable. Reviewed signs and symptoms of HF to look for. Continue Herceptin. Follow-up with echo in 3 months. 2. HTN  --uncontrolled. Will add spiro to help with fluid and HTN 3. LLE edema --check u/s. Start low-dose diuretic 4. Pericardial effusion --this is small. Hopefully will improve with diuresis need to watch closely with known breast CA.   Bensimhon, Daniel,MD 9:30 PM

## 2016-04-30 ENCOUNTER — Ambulatory Visit (HOSPITAL_BASED_OUTPATIENT_CLINIC_OR_DEPARTMENT_OTHER): Payer: BLUE CROSS/BLUE SHIELD

## 2016-04-30 ENCOUNTER — Other Ambulatory Visit (HOSPITAL_BASED_OUTPATIENT_CLINIC_OR_DEPARTMENT_OTHER): Payer: BLUE CROSS/BLUE SHIELD

## 2016-04-30 VITALS — BP 151/77 | HR 99 | Temp 98.1°F | Resp 19

## 2016-04-30 DIAGNOSIS — C50412 Malignant neoplasm of upper-outer quadrant of left female breast: Secondary | ICD-10-CM | POA: Diagnosis not present

## 2016-04-30 DIAGNOSIS — Z5112 Encounter for antineoplastic immunotherapy: Secondary | ICD-10-CM

## 2016-04-30 DIAGNOSIS — Z170 Estrogen receptor positive status [ER+]: Secondary | ICD-10-CM

## 2016-04-30 LAB — CBC WITH DIFFERENTIAL/PLATELET
BASO%: 0.5 % (ref 0.0–2.0)
Basophils Absolute: 0 10*3/uL (ref 0.0–0.1)
EOS%: 6.1 % (ref 0.0–7.0)
Eosinophils Absolute: 0.3 10*3/uL (ref 0.0–0.5)
HCT: 34.3 % — ABNORMAL LOW (ref 34.8–46.6)
HGB: 10.7 g/dL — ABNORMAL LOW (ref 11.6–15.9)
LYMPH%: 17.5 % (ref 14.0–49.7)
MCH: 26.3 pg (ref 25.1–34.0)
MCHC: 31.2 g/dL — ABNORMAL LOW (ref 31.5–36.0)
MCV: 84.3 fL (ref 79.5–101.0)
MONO#: 0.4 10*3/uL (ref 0.1–0.9)
MONO%: 6.6 % (ref 0.0–14.0)
NEUT#: 3.9 10*3/uL (ref 1.5–6.5)
NEUT%: 69.3 % (ref 38.4–76.8)
Platelets: 204 10*3/uL (ref 145–400)
RBC: 4.07 10*6/uL (ref 3.70–5.45)
RDW: 14.5 % (ref 11.2–14.5)
WBC: 5.6 10*3/uL (ref 3.9–10.3)
lymph#: 1 10*3/uL (ref 0.9–3.3)

## 2016-04-30 LAB — COMPREHENSIVE METABOLIC PANEL
ALT: 27 U/L (ref 0–55)
AST: 19 U/L (ref 5–34)
Albumin: 3.6 g/dL (ref 3.5–5.0)
Alkaline Phosphatase: 165 U/L — ABNORMAL HIGH (ref 40–150)
Anion Gap: 11 mEq/L (ref 3–11)
BUN: 10.5 mg/dL (ref 7.0–26.0)
CO2: 27 mEq/L (ref 22–29)
Calcium: 9.7 mg/dL (ref 8.4–10.4)
Chloride: 100 mEq/L (ref 98–109)
Creatinine: 0.9 mg/dL (ref 0.6–1.1)
EGFR: 67 mL/min/{1.73_m2} — ABNORMAL LOW (ref >90)
Glucose: 197 mg/dl — ABNORMAL HIGH (ref 70–140)
Potassium: 3.8 mEq/L (ref 3.5–5.1)
Sodium: 138 mEq/L (ref 136–145)
Total Bilirubin: 0.38 mg/dL (ref 0.20–1.20)
Total Protein: 7 g/dL (ref 6.4–8.3)

## 2016-04-30 MED ORDER — ACETAMINOPHEN 325 MG PO TABS
ORAL_TABLET | ORAL | Status: AC
  Filled 2016-04-30: qty 2

## 2016-04-30 MED ORDER — SODIUM CHLORIDE 0.9 % IV SOLN
Freq: Once | INTRAVENOUS | Status: AC
  Administered 2016-04-30: 11:00:00 via INTRAVENOUS

## 2016-04-30 MED ORDER — SODIUM CHLORIDE 0.9% FLUSH
10.0000 mL | INTRAVENOUS | Status: DC | PRN
Start: 2016-04-30 — End: 2016-04-30
  Administered 2016-04-30: 10 mL
  Filled 2016-04-30: qty 10

## 2016-04-30 MED ORDER — DIPHENHYDRAMINE HCL 25 MG PO CAPS
ORAL_CAPSULE | ORAL | Status: AC
  Filled 2016-04-30: qty 1

## 2016-04-30 MED ORDER — HEPARIN SOD (PORK) LOCK FLUSH 100 UNIT/ML IV SOLN
500.0000 [IU] | Freq: Once | INTRAVENOUS | Status: AC | PRN
  Administered 2016-04-30: 500 [IU]
  Filled 2016-04-30: qty 5

## 2016-04-30 MED ORDER — DIPHENHYDRAMINE HCL 25 MG PO CAPS
25.0000 mg | ORAL_CAPSULE | Freq: Once | ORAL | Status: AC
  Administered 2016-04-30: 25 mg via ORAL

## 2016-04-30 MED ORDER — ACETAMINOPHEN 325 MG PO TABS
650.0000 mg | ORAL_TABLET | Freq: Once | ORAL | Status: AC
  Administered 2016-04-30: 650 mg via ORAL

## 2016-04-30 MED ORDER — TRASTUZUMAB CHEMO 150 MG IV SOLR
6.0000 mg/kg | Freq: Once | INTRAVENOUS | Status: AC
  Administered 2016-04-30: 462 mg via INTRAVENOUS
  Filled 2016-04-30: qty 22

## 2016-04-30 NOTE — Patient Instructions (Signed)
[facility] Discharge Instructions for Patients Receiving Chemotherapy  Today you received the following chemotherapy agents: Herceptin   To help prevent nausea and vomiting after your treatment, we encourage you to take your nausea medication as directed.    If you develop nausea and vomiting that is not controlled by your nausea medication, call the clinic.   BELOW ARE SYMPTOMS THAT SHOULD BE REPORTED IMMEDIATELY:  *FEVER GREATER THAN 100.5 F  *CHILLS WITH OR WITHOUT FEVER  NAUSEA AND VOMITING THAT IS NOT CONTROLLED WITH YOUR NAUSEA MEDICATION  *UNUSUAL SHORTNESS OF BREATH  *UNUSUAL BRUISING OR BLEEDING  TENDERNESS IN MOUTH AND THROAT WITH OR WITHOUT PRESENCE OF ULCERS  *URINARY PROBLEMS  *BOWEL PROBLEMS  UNUSUAL RASH Items with * indicate a potential emergency and should be followed up as soon as possible.  Feel free to call the clinic you have any questions or concerns. The clinic phone number is [phone].  Please show the CHEMO ALERT CARD at check-in to the Emergency Department and triage nurse.   

## 2016-05-01 ENCOUNTER — Telehealth (HOSPITAL_COMMUNITY): Payer: Self-pay | Admitting: *Deleted

## 2016-05-01 ENCOUNTER — Ambulatory Visit (HOSPITAL_COMMUNITY)
Admission: RE | Admit: 2016-05-01 | Discharge: 2016-05-01 | Disposition: A | Discharge status: Home / Self Care | Payer: BLUE CROSS/BLUE SHIELD | Source: Ambulatory Visit | Attending: Internal Medicine | Admitting: Internal Medicine

## 2016-05-01 DIAGNOSIS — M7989 Other specified soft tissue disorders: Secondary | ICD-10-CM | POA: Insufficient documentation

## 2016-05-01 NOTE — Progress Notes (Signed)
**  Preliminary report by tech**  Bilateral lower extremity venous duplex completed. There is no evidence of deep or superficial vein thrombosis involving the right and left lower extremities. All visualized vessels appear patent and compressible. There is no evidence of Baker's cysts bilaterally. Results given to Dr. Clayborne Dana nurse, Susie.  05/01/16 9:20 AM Kristine Parrish RVT

## 2016-05-01 NOTE — Telephone Encounter (Signed)
Greg with Vascular called regarding BLE venous duplex on patient this morning.  She was negative for DVT.

## 2016-05-11 ENCOUNTER — Ambulatory Visit
Admission: RE | Admit: 2016-05-11 | Discharge: 2016-05-11 | Disposition: A | Discharge status: Home / Self Care | Payer: BLUE CROSS/BLUE SHIELD | Source: Ambulatory Visit | Attending: Radiation Oncology | Admitting: Radiation Oncology

## 2016-05-11 DIAGNOSIS — Z170 Estrogen receptor positive status [ER+]: Secondary | ICD-10-CM | POA: Diagnosis not present

## 2016-05-11 DIAGNOSIS — Z90710 Acquired absence of both cervix and uterus: Secondary | ICD-10-CM | POA: Diagnosis not present

## 2016-05-11 DIAGNOSIS — E119 Type 2 diabetes mellitus without complications: Secondary | ICD-10-CM | POA: Diagnosis not present

## 2016-05-11 DIAGNOSIS — Z833 Family history of diabetes mellitus: Secondary | ICD-10-CM | POA: Diagnosis not present

## 2016-05-11 DIAGNOSIS — Z7982 Long term (current) use of aspirin: Secondary | ICD-10-CM | POA: Diagnosis not present

## 2016-05-11 DIAGNOSIS — Z8249 Family history of ischemic heart disease and other diseases of the circulatory system: Secondary | ICD-10-CM | POA: Diagnosis not present

## 2016-05-11 DIAGNOSIS — K219 Gastro-esophageal reflux disease without esophagitis: Secondary | ICD-10-CM | POA: Diagnosis not present

## 2016-05-11 DIAGNOSIS — Z510 Encounter for antineoplastic radiation therapy: Secondary | ICD-10-CM | POA: Diagnosis not present

## 2016-05-11 DIAGNOSIS — I10 Essential (primary) hypertension: Secondary | ICD-10-CM | POA: Diagnosis not present

## 2016-05-11 DIAGNOSIS — Z7984 Long term (current) use of oral hypoglycemic drugs: Secondary | ICD-10-CM | POA: Diagnosis not present

## 2016-05-11 DIAGNOSIS — C50412 Malignant neoplasm of upper-outer quadrant of left female breast: Secondary | ICD-10-CM

## 2016-05-11 DIAGNOSIS — Z9889 Other specified postprocedural states: Secondary | ICD-10-CM | POA: Diagnosis not present

## 2016-05-11 DIAGNOSIS — Z86010 Personal history of colonic polyps: Secondary | ICD-10-CM | POA: Diagnosis not present

## 2016-05-11 DIAGNOSIS — Z811 Family history of alcohol abuse and dependence: Secondary | ICD-10-CM | POA: Diagnosis not present

## 2016-05-11 DIAGNOSIS — K449 Diaphragmatic hernia without obstruction or gangrene: Secondary | ICD-10-CM | POA: Diagnosis not present

## 2016-05-11 DIAGNOSIS — Z9049 Acquired absence of other specified parts of digestive tract: Secondary | ICD-10-CM | POA: Diagnosis not present

## 2016-05-11 NOTE — Progress Notes (Signed)
Radiation Oncology         [phone] ________________________________  Name: Kristine Parrish MRN: [id number]  Date: 05/11/2016  DOB: 12-Mar-1945  SIMULATION AND TREATMENT PLANNING NOTE,  Special treatment procedure  Outpatient  DIAGNOSIS:     ICD-9-CM ICD-10-CM   1. Malignant neoplasm of upper-outer quadrant of left breast in female, estrogen receptor positive ([name]) 174.4 C50.412    V86.0 Z17.0     NARRATIVE:  The patient was brought to the [facility].  Identity was confirmed.  All relevant records and images related to the planned course of therapy were reviewed.  The patient freely provided informed written consent to proceed with treatment after reviewing the details related to the planned course of therapy. The consent form was witnessed and verified by the simulation staff.    Then, the patient was set-up in a stable reproducible supine position for radiation therapy with her ipsilateral arm over her head, and her upper body secured in a custom-made Vac-lok device.  CT images were obtained.  Surface markings were placed.  The CT images were loaded into the planning software.    Special treatment procedure was performed today due to the extra time and effort required by myself to plan and prepare this patient for deep inspiration breath hold technique.  I have determined cardiac sparing to be of benefit to this patient to prevent long term cardiac damage due to radiation of the heart.  Bellows were placed on the patient's abdomen. To facilitate cardiac sparing, the patient was coached by the radiation therapists on breath hold techniques and breathing practice was performed. Practice waveforms were obtained. The patient was then scanned while maintaining breath hold in the treatment position.  This image was then transferred over to the imaging specialist. The imaging specialist then created a fusion of the free breathing and breath hold scans using the chest wall as the  stable structure. I personally reviewed the fusion in axial, coronal and sagittal image planes.  Excellent cardiac sparing was obtained.  I felt the patient is an appropriate candidate for breath hold and the patient will be treated as such.  The image fusion was then reviewed with the patient to reinforce the necessity of reproducible breath hold.  TREATMENT PLANNING NOTE: Treatment planning then occurred.  The radiation prescription was entered and confirmed.     A total of 5 medically necessary complex treatment devices were fabricated and supervised by me: 4 fields with MLCs for custom blocks to protect heart, and lungs;  and, a Vac-lok. MORE COMPLEX DEVICES MAY BE MADE IN DOSIMETRY FOR FIELD IN FIELD BEAMS FOR DOSE HOMOGENEITY.  I have requested : 3D Simulation which is medically necessary to give adequate dose to at risk tissues while sparing lungs and heart.  I have requested a DVH of the following structures: lungs, heart, lumpectomy cavity.    The patient will receive 50 Gy in 25 fractions to the left breast with 2 tangential fields.  She will receive 45 Gy in 25 fractions to the left axilla and SCV region with 2 opposed fields .  This will be followed by a breast boost.  Optical Surface Tracking Plan:  Since intensity modulated radiotherapy (IMRT) and 3D conformal radiation treatment methods are predicated on accurate and precise positioning for treatment, intrafraction motion monitoring is medically necessary to ensure accurate and safe treatment delivery. The ability to quantify intrafraction motion without excessive ionizing radiation dose can only be performed with optical surface tracking. Accordingly,  surface imaging offers the opportunity to obtain 3D measurements of patient position throughout IMRT and 3D treatments without excessive radiation exposure. I am ordering optical surface tracking for this patient's upcoming course of radiotherapy.  ________________________________    Reference:  Ursula Alert, J, et al. Surface imaging-based analysis of intrafraction motion for breast radiotherapy patients.Journal of [place], n. 6, nov. 2014. ISSN [id number].  Available at: <http://www.jacmp.org/index.php/jacmp/article/view/4957>.    -----------------------------------  Eppie Gibson, MD

## 2016-05-13 DIAGNOSIS — Z9049 Acquired absence of other specified parts of digestive tract: Secondary | ICD-10-CM | POA: Diagnosis not present

## 2016-05-13 DIAGNOSIS — I10 Essential (primary) hypertension: Secondary | ICD-10-CM | POA: Diagnosis not present

## 2016-05-13 DIAGNOSIS — E119 Type 2 diabetes mellitus without complications: Secondary | ICD-10-CM | POA: Diagnosis not present

## 2016-05-13 DIAGNOSIS — Z90710 Acquired absence of both cervix and uterus: Secondary | ICD-10-CM | POA: Diagnosis not present

## 2016-05-13 DIAGNOSIS — Z9889 Other specified postprocedural states: Secondary | ICD-10-CM | POA: Diagnosis not present

## 2016-05-13 DIAGNOSIS — Z811 Family history of alcohol abuse and dependence: Secondary | ICD-10-CM | POA: Diagnosis not present

## 2016-05-13 DIAGNOSIS — Z8249 Family history of ischemic heart disease and other diseases of the circulatory system: Secondary | ICD-10-CM | POA: Diagnosis not present

## 2016-05-13 DIAGNOSIS — K449 Diaphragmatic hernia without obstruction or gangrene: Secondary | ICD-10-CM | POA: Diagnosis not present

## 2016-05-13 DIAGNOSIS — Z510 Encounter for antineoplastic radiation therapy: Secondary | ICD-10-CM | POA: Diagnosis not present

## 2016-05-13 DIAGNOSIS — Z7982 Long term (current) use of aspirin: Secondary | ICD-10-CM | POA: Diagnosis not present

## 2016-05-13 DIAGNOSIS — Z170 Estrogen receptor positive status [ER+]: Secondary | ICD-10-CM | POA: Diagnosis not present

## 2016-05-13 DIAGNOSIS — Z833 Family history of diabetes mellitus: Secondary | ICD-10-CM | POA: Diagnosis not present

## 2016-05-13 DIAGNOSIS — Z7984 Long term (current) use of oral hypoglycemic drugs: Secondary | ICD-10-CM | POA: Diagnosis not present

## 2016-05-13 DIAGNOSIS — K219 Gastro-esophageal reflux disease without esophagitis: Secondary | ICD-10-CM | POA: Diagnosis not present

## 2016-05-13 DIAGNOSIS — Z86010 Personal history of colonic polyps: Secondary | ICD-10-CM | POA: Diagnosis not present

## 2016-05-13 DIAGNOSIS — C50412 Malignant neoplasm of upper-outer quadrant of left female breast: Secondary | ICD-10-CM | POA: Diagnosis not present

## 2016-05-18 ENCOUNTER — Ambulatory Visit
Admission: RE | Admit: 2016-05-18 | Discharge: 2016-05-18 | Disposition: A | Discharge status: Home / Self Care | Payer: BLUE CROSS/BLUE SHIELD | Source: Ambulatory Visit | Attending: Radiation Oncology | Admitting: Radiation Oncology

## 2016-05-18 DIAGNOSIS — Z9889 Other specified postprocedural states: Secondary | ICD-10-CM | POA: Diagnosis not present

## 2016-05-18 DIAGNOSIS — Z7982 Long term (current) use of aspirin: Secondary | ICD-10-CM | POA: Diagnosis not present

## 2016-05-18 DIAGNOSIS — Z90710 Acquired absence of both cervix and uterus: Secondary | ICD-10-CM | POA: Diagnosis not present

## 2016-05-18 DIAGNOSIS — C50412 Malignant neoplasm of upper-outer quadrant of left female breast: Secondary | ICD-10-CM | POA: Diagnosis not present

## 2016-05-18 DIAGNOSIS — Z833 Family history of diabetes mellitus: Secondary | ICD-10-CM | POA: Diagnosis not present

## 2016-05-18 DIAGNOSIS — K449 Diaphragmatic hernia without obstruction or gangrene: Secondary | ICD-10-CM | POA: Diagnosis not present

## 2016-05-18 DIAGNOSIS — Z9049 Acquired absence of other specified parts of digestive tract: Secondary | ICD-10-CM | POA: Diagnosis not present

## 2016-05-18 DIAGNOSIS — I10 Essential (primary) hypertension: Secondary | ICD-10-CM | POA: Diagnosis not present

## 2016-05-18 DIAGNOSIS — E119 Type 2 diabetes mellitus without complications: Secondary | ICD-10-CM | POA: Diagnosis not present

## 2016-05-18 DIAGNOSIS — K219 Gastro-esophageal reflux disease without esophagitis: Secondary | ICD-10-CM | POA: Diagnosis not present

## 2016-05-18 DIAGNOSIS — Z7984 Long term (current) use of oral hypoglycemic drugs: Secondary | ICD-10-CM | POA: Diagnosis not present

## 2016-05-18 DIAGNOSIS — Z510 Encounter for antineoplastic radiation therapy: Secondary | ICD-10-CM | POA: Diagnosis not present

## 2016-05-18 DIAGNOSIS — Z170 Estrogen receptor positive status [ER+]: Secondary | ICD-10-CM | POA: Diagnosis not present

## 2016-05-18 DIAGNOSIS — Z86010 Personal history of colonic polyps: Secondary | ICD-10-CM | POA: Diagnosis not present

## 2016-05-18 DIAGNOSIS — Z8249 Family history of ischemic heart disease and other diseases of the circulatory system: Secondary | ICD-10-CM | POA: Diagnosis not present

## 2016-05-18 DIAGNOSIS — Z811 Family history of alcohol abuse and dependence: Secondary | ICD-10-CM | POA: Diagnosis not present

## 2016-05-19 ENCOUNTER — Ambulatory Visit
Admission: RE | Admit: 2016-05-19 | Discharge: 2016-05-19 | Disposition: A | Discharge status: Home / Self Care | Payer: BLUE CROSS/BLUE SHIELD | Source: Ambulatory Visit | Attending: Radiation Oncology | Admitting: Radiation Oncology

## 2016-05-19 DIAGNOSIS — Z170 Estrogen receptor positive status [ER+]: Secondary | ICD-10-CM | POA: Diagnosis not present

## 2016-05-19 DIAGNOSIS — E119 Type 2 diabetes mellitus without complications: Secondary | ICD-10-CM | POA: Diagnosis not present

## 2016-05-19 DIAGNOSIS — K449 Diaphragmatic hernia without obstruction or gangrene: Secondary | ICD-10-CM | POA: Diagnosis not present

## 2016-05-19 DIAGNOSIS — K219 Gastro-esophageal reflux disease without esophagitis: Secondary | ICD-10-CM | POA: Diagnosis not present

## 2016-05-19 DIAGNOSIS — Z510 Encounter for antineoplastic radiation therapy: Secondary | ICD-10-CM | POA: Diagnosis not present

## 2016-05-19 DIAGNOSIS — Z7982 Long term (current) use of aspirin: Secondary | ICD-10-CM | POA: Diagnosis not present

## 2016-05-19 DIAGNOSIS — Z7984 Long term (current) use of oral hypoglycemic drugs: Secondary | ICD-10-CM | POA: Diagnosis not present

## 2016-05-19 DIAGNOSIS — Z833 Family history of diabetes mellitus: Secondary | ICD-10-CM | POA: Diagnosis not present

## 2016-05-19 DIAGNOSIS — Z9049 Acquired absence of other specified parts of digestive tract: Secondary | ICD-10-CM | POA: Diagnosis not present

## 2016-05-19 DIAGNOSIS — C50412 Malignant neoplasm of upper-outer quadrant of left female breast: Secondary | ICD-10-CM | POA: Diagnosis not present

## 2016-05-19 DIAGNOSIS — Z90710 Acquired absence of both cervix and uterus: Secondary | ICD-10-CM | POA: Diagnosis not present

## 2016-05-19 DIAGNOSIS — Z9889 Other specified postprocedural states: Secondary | ICD-10-CM | POA: Diagnosis not present

## 2016-05-19 DIAGNOSIS — Z86010 Personal history of colonic polyps: Secondary | ICD-10-CM | POA: Diagnosis not present

## 2016-05-19 DIAGNOSIS — Z811 Family history of alcohol abuse and dependence: Secondary | ICD-10-CM | POA: Diagnosis not present

## 2016-05-19 DIAGNOSIS — Z8249 Family history of ischemic heart disease and other diseases of the circulatory system: Secondary | ICD-10-CM | POA: Diagnosis not present

## 2016-05-19 DIAGNOSIS — I10 Essential (primary) hypertension: Secondary | ICD-10-CM | POA: Diagnosis not present

## 2016-05-20 ENCOUNTER — Ambulatory Visit
Admission: RE | Admit: 2016-05-20 | Discharge: 2016-05-20 | Disposition: A | Discharge status: Home / Self Care | Payer: BLUE CROSS/BLUE SHIELD | Source: Ambulatory Visit | Attending: Radiation Oncology | Admitting: Radiation Oncology

## 2016-05-20 DIAGNOSIS — K219 Gastro-esophageal reflux disease without esophagitis: Secondary | ICD-10-CM | POA: Diagnosis not present

## 2016-05-20 DIAGNOSIS — Z90710 Acquired absence of both cervix and uterus: Secondary | ICD-10-CM | POA: Diagnosis not present

## 2016-05-20 DIAGNOSIS — Z7984 Long term (current) use of oral hypoglycemic drugs: Secondary | ICD-10-CM | POA: Diagnosis not present

## 2016-05-20 DIAGNOSIS — Z9889 Other specified postprocedural states: Secondary | ICD-10-CM | POA: Diagnosis not present

## 2016-05-20 DIAGNOSIS — E119 Type 2 diabetes mellitus without complications: Secondary | ICD-10-CM | POA: Diagnosis not present

## 2016-05-20 DIAGNOSIS — I10 Essential (primary) hypertension: Secondary | ICD-10-CM | POA: Diagnosis not present

## 2016-05-20 DIAGNOSIS — Z7982 Long term (current) use of aspirin: Secondary | ICD-10-CM | POA: Diagnosis not present

## 2016-05-20 DIAGNOSIS — Z811 Family history of alcohol abuse and dependence: Secondary | ICD-10-CM | POA: Diagnosis not present

## 2016-05-20 DIAGNOSIS — K449 Diaphragmatic hernia without obstruction or gangrene: Secondary | ICD-10-CM | POA: Diagnosis not present

## 2016-05-20 DIAGNOSIS — Z86010 Personal history of colonic polyps: Secondary | ICD-10-CM | POA: Diagnosis not present

## 2016-05-20 DIAGNOSIS — Z833 Family history of diabetes mellitus: Secondary | ICD-10-CM | POA: Diagnosis not present

## 2016-05-20 DIAGNOSIS — Z9049 Acquired absence of other specified parts of digestive tract: Secondary | ICD-10-CM | POA: Diagnosis not present

## 2016-05-20 DIAGNOSIS — Z8249 Family history of ischemic heart disease and other diseases of the circulatory system: Secondary | ICD-10-CM | POA: Diagnosis not present

## 2016-05-20 DIAGNOSIS — Z170 Estrogen receptor positive status [ER+]: Secondary | ICD-10-CM | POA: Diagnosis not present

## 2016-05-20 DIAGNOSIS — Z510 Encounter for antineoplastic radiation therapy: Secondary | ICD-10-CM | POA: Diagnosis not present

## 2016-05-20 DIAGNOSIS — C50412 Malignant neoplasm of upper-outer quadrant of left female breast: Secondary | ICD-10-CM | POA: Diagnosis not present

## 2016-05-21 ENCOUNTER — Ambulatory Visit (HOSPITAL_BASED_OUTPATIENT_CLINIC_OR_DEPARTMENT_OTHER): Payer: BLUE CROSS/BLUE SHIELD

## 2016-05-21 ENCOUNTER — Other Ambulatory Visit (HOSPITAL_BASED_OUTPATIENT_CLINIC_OR_DEPARTMENT_OTHER): Payer: BLUE CROSS/BLUE SHIELD

## 2016-05-21 ENCOUNTER — Ambulatory Visit (HOSPITAL_BASED_OUTPATIENT_CLINIC_OR_DEPARTMENT_OTHER): Payer: BLUE CROSS/BLUE SHIELD | Admitting: Oncology

## 2016-05-21 ENCOUNTER — Encounter: Payer: Self-pay | Admitting: *Deleted

## 2016-05-21 ENCOUNTER — Ambulatory Visit
Admission: RE | Admit: 2016-05-21 | Discharge: 2016-05-21 | Disposition: A | Discharge status: Home / Self Care | Payer: BLUE CROSS/BLUE SHIELD | Source: Ambulatory Visit | Attending: Radiation Oncology | Admitting: Radiation Oncology

## 2016-05-21 VITALS — BP 157/82 | HR 84 | Temp 97.6°F | Resp 18 | Ht 64.5 in | Wt 168.8 lb

## 2016-05-21 DIAGNOSIS — Z811 Family history of alcohol abuse and dependence: Secondary | ICD-10-CM | POA: Diagnosis not present

## 2016-05-21 DIAGNOSIS — Z7984 Long term (current) use of oral hypoglycemic drugs: Secondary | ICD-10-CM | POA: Diagnosis not present

## 2016-05-21 DIAGNOSIS — I10 Essential (primary) hypertension: Secondary | ICD-10-CM | POA: Diagnosis not present

## 2016-05-21 DIAGNOSIS — Z9049 Acquired absence of other specified parts of digestive tract: Secondary | ICD-10-CM | POA: Diagnosis not present

## 2016-05-21 DIAGNOSIS — Z7982 Long term (current) use of aspirin: Secondary | ICD-10-CM | POA: Diagnosis not present

## 2016-05-21 DIAGNOSIS — K449 Diaphragmatic hernia without obstruction or gangrene: Secondary | ICD-10-CM | POA: Diagnosis not present

## 2016-05-21 DIAGNOSIS — Z833 Family history of diabetes mellitus: Secondary | ICD-10-CM | POA: Diagnosis not present

## 2016-05-21 DIAGNOSIS — C50412 Malignant neoplasm of upper-outer quadrant of left female breast: Secondary | ICD-10-CM

## 2016-05-21 DIAGNOSIS — M8580 Other specified disorders of bone density and structure, unspecified site: Secondary | ICD-10-CM

## 2016-05-21 DIAGNOSIS — Z8249 Family history of ischemic heart disease and other diseases of the circulatory system: Secondary | ICD-10-CM | POA: Diagnosis not present

## 2016-05-21 DIAGNOSIS — Z90710 Acquired absence of both cervix and uterus: Secondary | ICD-10-CM | POA: Diagnosis not present

## 2016-05-21 DIAGNOSIS — Z5112 Encounter for antineoplastic immunotherapy: Secondary | ICD-10-CM

## 2016-05-21 DIAGNOSIS — Z86010 Personal history of colonic polyps: Secondary | ICD-10-CM | POA: Diagnosis not present

## 2016-05-21 DIAGNOSIS — Z170 Estrogen receptor positive status [ER+]: Secondary | ICD-10-CM | POA: Diagnosis not present

## 2016-05-21 DIAGNOSIS — C773 Secondary and unspecified malignant neoplasm of axilla and upper limb lymph nodes: Secondary | ICD-10-CM | POA: Diagnosis not present

## 2016-05-21 DIAGNOSIS — E119 Type 2 diabetes mellitus without complications: Secondary | ICD-10-CM | POA: Diagnosis not present

## 2016-05-21 DIAGNOSIS — Z9889 Other specified postprocedural states: Secondary | ICD-10-CM | POA: Diagnosis not present

## 2016-05-21 DIAGNOSIS — K219 Gastro-esophageal reflux disease without esophagitis: Secondary | ICD-10-CM | POA: Diagnosis not present

## 2016-05-21 DIAGNOSIS — Z510 Encounter for antineoplastic radiation therapy: Secondary | ICD-10-CM | POA: Diagnosis not present

## 2016-05-21 LAB — COMPREHENSIVE METABOLIC PANEL
ALT: 29 U/L (ref 0–55)
AST: 20 U/L (ref 5–34)
Albumin: 3.8 g/dL (ref 3.5–5.0)
Alkaline Phosphatase: 180 U/L — ABNORMAL HIGH (ref 40–150)
Anion Gap: 10 mEq/L (ref 3–11)
BUN: 9.7 mg/dL (ref 7.0–26.0)
CO2: 28 mEq/L (ref 22–29)
Calcium: 10 mg/dL (ref 8.4–10.4)
Chloride: 101 mEq/L (ref 98–109)
Creatinine: 0.9 mg/dL (ref 0.6–1.1)
EGFR: 63 mL/min/{1.73_m2} — ABNORMAL LOW (ref >90)
Glucose: 188 mg/dl — ABNORMAL HIGH (ref 70–140)
Potassium: 3.9 mEq/L (ref 3.5–5.1)
Sodium: 139 mEq/L (ref 136–145)
Total Bilirubin: 0.3 mg/dL (ref 0.20–1.20)
Total Protein: 7.2 g/dL (ref 6.4–8.3)

## 2016-05-21 LAB — CBC WITH DIFFERENTIAL/PLATELET
BASO%: 0.3 % (ref 0.0–2.0)
Basophils Absolute: 0 10*3/uL (ref 0.0–0.1)
EOS%: 3.9 % (ref 0.0–7.0)
Eosinophils Absolute: 0.2 10*3/uL (ref 0.0–0.5)
HCT: 36.3 % (ref 34.8–46.6)
HGB: 11.3 g/dL — ABNORMAL LOW (ref 11.6–15.9)
LYMPH%: 19.6 % (ref 14.0–49.7)
MCH: 25.6 pg (ref 25.1–34.0)
MCHC: 31.1 g/dL — ABNORMAL LOW (ref 31.5–36.0)
MCV: 82.3 fL (ref 79.5–101.0)
MONO#: 0.4 10*3/uL (ref 0.1–0.9)
MONO%: 5.9 % (ref 0.0–14.0)
NEUT#: 4.2 10*3/uL (ref 1.5–6.5)
NEUT%: 70.3 % (ref 38.4–76.8)
Platelets: 227 10*3/uL (ref 145–400)
RBC: 4.41 10*6/uL (ref 3.70–5.45)
RDW: 14.4 % (ref 11.2–14.5)
WBC: 5.9 10*3/uL (ref 3.9–10.3)
lymph#: 1.2 10*3/uL (ref 0.9–3.3)

## 2016-05-21 MED ORDER — SODIUM CHLORIDE 0.9% FLUSH
10.0000 mL | INTRAVENOUS | Status: DC | PRN
  Administered 2016-05-21: 10 mL
  Filled 2016-05-21: qty 10

## 2016-05-21 MED ORDER — DIPHENHYDRAMINE HCL 25 MG PO CAPS
25.0000 mg | ORAL_CAPSULE | Freq: Once | ORAL | Status: AC
  Administered 2016-05-21: 25 mg via ORAL

## 2016-05-21 MED ORDER — DIPHENHYDRAMINE HCL 25 MG PO CAPS
ORAL_CAPSULE | ORAL | Status: AC
  Filled 2016-05-21: qty 1

## 2016-05-21 MED ORDER — ACETAMINOPHEN 325 MG PO TABS
ORAL_TABLET | ORAL | Status: AC
  Filled 2016-05-21: qty 2

## 2016-05-21 MED ORDER — SODIUM CHLORIDE 0.9 % IV SOLN
Freq: Once | INTRAVENOUS | Status: AC
  Administered 2016-05-21: 13:00:00 via INTRAVENOUS

## 2016-05-21 MED ORDER — HEPARIN SOD (PORK) LOCK FLUSH 100 UNIT/ML IV SOLN
500.0000 [IU] | Freq: Once | INTRAVENOUS | Status: AC | PRN
  Administered 2016-05-21: 500 [IU]
  Filled 2016-05-21: qty 5

## 2016-05-21 MED ORDER — ACETAMINOPHEN 325 MG PO TABS
650.0000 mg | ORAL_TABLET | Freq: Once | ORAL | Status: AC
  Administered 2016-05-21: 650 mg via ORAL

## 2016-05-21 MED ORDER — TRASTUZUMAB CHEMO 150 MG IV SOLR
6.0000 mg/kg | Freq: Once | INTRAVENOUS | Status: AC
  Administered 2016-05-21: 462 mg via INTRAVENOUS
  Filled 2016-05-21: qty 22

## 2016-05-21 NOTE — Progress Notes (Signed)
Kristine Parrish  [phone] Fax:(336) [phone]     ID: Kristine Parrish DOB: 01/30/1946  MR#: [id number]  VOH#:[id number]  Patient Care Team: Orpah Melter, MD as PCP - General (Family Medicine) Kristine Messing III, MD as Consulting Physician (General Surgery) Kristine Cruel, MD as Consulting Physician (Oncology) Kristine Gibson, MD as Attending Physician (Radiation Oncology) Kristine Donning, MD (Dermatology) Kristine Spates, MD as Consulting Physician (Gastroenterology) Kristine Parrish, DPM as Consulting Physician (Podiatry) Kristine Gilmore Evette Cristal, MD as Consulting Physician (Obstetrics and Gynecology) Kristine Dresser, MD as Consulting Physician (Cardiology) PCP: Orpah Melter, MD OTHER MD:  CHIEF COMPLAINT: Estrogen receptor positive breast cancer  CURRENT TREATMENT: anti-HER-2 immunotherapy; adjuvant radiation    BREAST CANCER HISTORY: From the original intake note  Kristine Parrish herself noted a change in her left breast and brought it to the attention of Dr. Nori Parrish, who set her up for bilateral diagnostic mammography with tomography and left breast ultrasonography at the [facility] 08/09/2015. The breast density was category D. In the area of palpable concern there was a mass with irregular margins and architectural distortion, measuring approximately 3 cm. This was palpable in the upper outer quadrant near the nipple. The ultrasound confirmed an irregular hypoechoic mass approximately 3 cm from the nipple at the 10:00 position measuring 2.7 cm. Ultrasound of the left axilla found a single level I left axillary node with focal cortical thickening.  On 08/13/2015 the patient underwent biopsy of the left breast mass and the suspicious left axillary lymph node, both showing invasive ductal carcinoma, grade 2 or 3, estrogen receptor 100% positive, progesterone receptor 100% positive, both with strong staining intensity, with an MIB-1 of 50%, and HER-2 amplification, the signals ratio  being 2.2 to and the number per cell 3.55  The patient's subsequent history is as detailed below  INTERVAL HISTORY: Kristine Parrish returns today for follow-up of her HER-2/neu positive breast cancer accompanied by her daughter. Since her last visit here she has started her radiation treatments. She is tolerating them well. She has had no significant fatigue and minimal problems with her skin.  She continues to receive trastuzumab every 3 weeks, with a dose due today. She had an echocardiogram in February which showed an excellent ejection fraction. She tolerates the treatments with no Parrish effects that she is aware of.  REVIEW OF SYSTEMS: Kristine Parrish cannot exercise regularly because of her continuing foot problems. We are waiting until she completes radiation to take care of that because otherwise she might be able to drive herself here. She does have pain, and tramadol helps, with an occasional Aleve. She tells me her sugars are better. Her hair is coming in nicely although she still does not there to not wear a wig. A detailed review of systems today was otherwise stable.  PAST MEDICAL HISTORY: Past Medical History:  Diagnosis Date  . Arthritis    "hands "  . Breast cancer of upper-outer quadrant of left female breast ([place]) 08/15/2015  . Colon polyps   . Dyspnea    with exertion- "from chemo"  . GERD (gastroesophageal reflux disease)   . Hiatal hernia   . History of bronchitis   . Hypertension   . Neuropathy ([place])    from chemopathy  . Peptic ulcer disease   . Peripheral neuropathy ([place])   . Type 2 diabetes mellitus ([place])    Type II    PAST SURGICAL HISTORY: Past Surgical History:  Procedure Laterality Date  . APPENDECTOMY  1974  .  cataract surgery Bilateral 04/2012  . CHOLECYSTECTOMY  1996  . COLONOSCOPY    . FOOT SURGERY     left bunion removed  . nerve removed from left foot  2000  . PORTACATH PLACEMENT N/A 09/05/2015   Procedure: INSERTION PORT-A-CATH;  Surgeon: Kristine Messing III, MD;   Location: [facility];  Service: General;  Laterality: N/A;  . RADIOACTIVE SEED GUIDED PARTIAL MASTECTOMY/AXILLARY SENTINEL NODE BIOPSY/AXILLARY NODE DISSECTION Left 02/14/2016   Procedure: LEFT BREAST RADIOACTIVE SEED GUIDED LUMPECTOMY WITH LEFT  SENTINEL LYMPH NODE BIOPSY AND LEFT  SEED TARGETED DISSECTION;  Surgeon: Kristine Messing III, MD;  Location: [facility];  Service: General;  Laterality: Left;  . TUBAL LIGATION    . VAGINAL HYSTERECTOMY  1995    FAMILY HISTORY Family History  Problem Relation Age of Onset  . Diabetes Mother   . Hypertension Mother   . Alcoholism Maternal Grandfather   . Congestive Heart Failure Maternal Grandmother   The patient has little information about her father and is not sure of his cause of death or age at death. The patient's mother died at age 43. The patient had no siblings. She was raised by her grandmother. She is not aware of any breast or ovarian cancer history in the family   GYNECOLOGIC HISTORY:  No LMP recorded. Patient has had a hysterectomy.  menarche age 40, first live birth age 42. The patient is GX P2. She underwent hysterectomy with bilateral salpingo-oophorectomy in 1994, and has been on estrogen replacement since that time, discontinued June 2017.   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 Daughter Kristine Parrish lives in [place] and works as a Teacher, music for the  [employer]. Son Kristine Parrish lives in [place] where he is Secondary school teacher. The patient had one grandchild. She attends [employer]. The patient's daughter Kristine Parrish is her healthcare power of attorney.     HEALTH MAINTENANCE: Social History  Substance Use Topics  . Smoking status: Never Smoker  . Smokeless tobacco: Never Used  . Alcohol use No     Colonoscopy: 2012/[facility]  PAP:  Bone density: 2015/"normal" at Dr.  Verlon Au  Lipid panel:  Allergies  Allergen Reactions  . No Known Allergies     Current Outpatient Prescriptions  Medication Sig Dispense Refill  . aspirin 81 MG tablet Take 81 mg by mouth daily.    Marland Kitchen doxycycline (VIBRAMYCIN) 100 MG capsule Take 100 mg by mouth 2 (two) times daily.    Marland Kitchen gabapentin (NEURONTIN) 800 MG tablet Take 800 mg by mouth 3 (three) times daily.    . hydrochlorothiazide (MICROZIDE) 12.5 MG capsule Take 12.5 mg by mouth daily.    Marland Kitchen lovastatin (MEVACOR) 20 MG tablet Take 20 mg by mouth at bedtime.    . metFORMIN (GLUCOPHAGE) 500 MG tablet Take 500 mg by mouth 2 (two) times daily with a meal.     . Multiple Vitamin (MULTIVITAMIN) tablet Take 1 tablet by mouth daily.    Marland Kitchen omeprazole (PRILOSEC) 20 MG capsule Take 20 mg by mouth daily.    . potassium chloride (MICRO-K) 10 MEQ CR capsule TAKE 2 CAPSULES (20 MEQ TOTAL) BY MOUTH DAILY. 60 capsule 0  . Probiotic Product (PROBIOTIC DAILY PO) Take 1 tablet by mouth daily. Reported on 08/21/2015    . spironolactone (ALDACTONE) 25 MG tablet Take 0.5 tablets (12.5  mg total) by mouth daily. 45 tablet 3  . traMADol (ULTRAM) 50 MG tablet Take 1 tablet (50 mg total) by mouth every 6 (six) hours as needed. 60 tablet 3   No current facility-administered medications for this visit.     OBJECTIVE: Middle-aged white woman In no acute distress  Vitals:   05/21/16 1129  BP: (!) 157/82  Pulse: 84  Resp: 18  Temp: 97.6 F (36.4 C)     Body mass index is 28.53 kg/m.    ECOG FS:1 - Symptomatic but completely ambulatory  Sclerae unicteric, pupils round and equal Oropharynx clear and moist-- no thrush or other lesions No cervical or supraclavicular adenopathy Lungs no rales or rhonchi Heart regular rate and rhythm Abd soft, nontender, positive bowel sounds MSK no focal spinal tenderness, no upper extremity lymphedema Neuro: nonfocal, well oriented, appropriate affect Breasts: The right breast is benign. The left breast is status Parrish  lumpectomy and is currently undergoing radiation. There is minimal erythema. The cosmetic result is good. Both axillae are benign.  LAB RESULTS:  CMP     Component Value Date/Time   NA 138 04/30/2016 0950   K 3.8 04/30/2016 0950   CL 102 09/03/2015 1542   CO2 27 04/30/2016 0950   GLUCOSE 197 (H) 04/30/2016 0950   BUN 10.5 04/30/2016 0950   CREATININE 0.9 04/30/2016 0950   CALCIUM 9.7 04/30/2016 0950   PROT 7.0 04/30/2016 0950   ALBUMIN 3.6 04/30/2016 0950   AST 19 04/30/2016 0950   ALT 27 04/30/2016 0950   ALKPHOS 165 (H) 04/30/2016 0950   BILITOT 0.38 04/30/2016 0950   GFRNONAA >60 09/03/2015 1542   GFRAA >60 09/03/2015 1542    INo results found for: SPEP, UPEP  Lab Results  Component Value Date   WBC 5.9 05/21/2016   NEUTROABS 4.2 05/21/2016   HGB 11.3 (L) 05/21/2016   HCT 36.3 05/21/2016   MCV 82.3 05/21/2016   PLT 227 05/21/2016      Chemistry      Component Value Date/Time   NA 138 04/30/2016 0950   K 3.8 04/30/2016 0950   CL 102 09/03/2015 1542   CO2 27 04/30/2016 0950   BUN 10.5 04/30/2016 0950   CREATININE 0.9 04/30/2016 0950      Component Value Date/Time   CALCIUM 9.7 04/30/2016 0950   ALKPHOS 165 (H) 04/30/2016 0950   AST 19 04/30/2016 0950   ALT 27 04/30/2016 0950   BILITOT 0.38 04/30/2016 0950       No results found for: LABCA2  No components found for: PXTGG269  No results for input(s): INR in the last 168 hours.  Urinalysis No results found for: COLORURINE, APPEARANCEUR, LABSPEC, PHURINE, GLUCOSEU, HGBUR, BILIRUBINUR, KETONESUR, PROTEINUR, UROBILINOGEN, NITRITE, LEUKOCYTESUR  STUDIES: No results found.  ELIGIBLE FOR AVAILABLE RESEARCH PROTOCOL: Not eligible for PALLAS because HER-2 positive  ASSESSMENT: 71 y.o. [place] woman status Parrish left breast upper outer quadrant and left axillary lymph node biopsy 08/13/2015 both positive for an invasive ductal carcinoma, grade 2 or 3, triple positive, with an MIB-1 of 50%  (1)  neoadjuvant chemotherapy consisting of carboplatin, docetaxel, trastuzumab and pertuzumab given every 21 days Started 09/11/2015  (a) treatment changed to Abraxane and trastuzumab with cycle 2 because of Parrish effects after cycle 1  (b) Abraxane discontinued after 3d dose (10/17/2015) because of peripheral neuropathy  (c) cyclophosphamide, methotrexate and fluorouracil (CMF) started 11/07/2015, 4 cycles, last dose 01/09/2016   (2) trastuzumab will be continued to complete a year (  Through June 2018).  (a) most recent echocardiogram 04/23/2016 showed an ejection fraction of 60-65 %   (3) status Parrish left lumpectomy with TAD 02/14/2016 showing a residual pT1c pN1 invasive ductal carcinoma, grade 2, estrogen and progesterone receptor positive, now HER-2 not amplified; margins were negative  (a) left axillary seroma/abscess drained surgically 03/25/2016  (4) adjuvant radiation in process  (5) anti-estrogens to follow at the completion of local treatment  PLAN: I spent approximately 30 minutes today with Kristine Parrish and her daughter going over her situation. I don't have a simple solution for the foot problem and she is going to require orthopedic referral. We will facilitate that.  She is continuing the trastuzumab through June. She will need her next echocardiogram sometime in May.  I encouraged her to not wear her weight anymore. Her hair is plenty live think she looks quite good with her salt-and-pepper cover. She is not quite ready yet however.  Since we are likely to start anastrozole, I have put her in for a bone density to be done before her return visit with me. We will also obtain a vitamin D level before that visit.  Otherwise she will complete her radiation treatments and late April and return to see me early May.  She knows to call for any other problems that may develop before that visit  Kristine Cruel, MD   05/21/2016 11:42 AM Medical Oncology and Hematology [facility] thank you so much

## 2016-05-21 NOTE — Patient Instructions (Signed)
[facility] Discharge Instructions for Patients Receiving Chemotherapy  Today you received the following chemotherapy agents: Herceptin   To help prevent nausea and vomiting after your treatment, we encourage you to take your nausea medication as directed.    If you develop nausea and vomiting that is not controlled by your nausea medication, call the clinic.   BELOW ARE SYMPTOMS THAT SHOULD BE REPORTED IMMEDIATELY:  *FEVER GREATER THAN 100.5 F  *CHILLS WITH OR WITHOUT FEVER  NAUSEA AND VOMITING THAT IS NOT CONTROLLED WITH YOUR NAUSEA MEDICATION  *UNUSUAL SHORTNESS OF BREATH  *UNUSUAL BRUISING OR BLEEDING  TENDERNESS IN MOUTH AND THROAT WITH OR WITHOUT PRESENCE OF ULCERS  *URINARY PROBLEMS  *BOWEL PROBLEMS  UNUSUAL RASH Items with * indicate a potential emergency and should be followed up as soon as possible.  Feel free to call the clinic you have any questions or concerns. The clinic phone number is [phone].  Please show the CHEMO ALERT CARD at check-in to the Emergency Department and triage nurse.   

## 2016-05-22 ENCOUNTER — Ambulatory Visit
Admission: RE | Admit: 2016-05-22 | Discharge: 2016-05-22 | Disposition: A | Discharge status: Home / Self Care | Payer: BLUE CROSS/BLUE SHIELD | Source: Ambulatory Visit | Attending: Radiation Oncology | Admitting: Radiation Oncology

## 2016-05-22 DIAGNOSIS — Z833 Family history of diabetes mellitus: Secondary | ICD-10-CM | POA: Diagnosis not present

## 2016-05-22 DIAGNOSIS — Z8249 Family history of ischemic heart disease and other diseases of the circulatory system: Secondary | ICD-10-CM | POA: Diagnosis not present

## 2016-05-22 DIAGNOSIS — C50412 Malignant neoplasm of upper-outer quadrant of left female breast: Secondary | ICD-10-CM | POA: Diagnosis not present

## 2016-05-22 DIAGNOSIS — I10 Essential (primary) hypertension: Secondary | ICD-10-CM | POA: Diagnosis not present

## 2016-05-22 DIAGNOSIS — Z7984 Long term (current) use of oral hypoglycemic drugs: Secondary | ICD-10-CM | POA: Diagnosis not present

## 2016-05-22 DIAGNOSIS — K219 Gastro-esophageal reflux disease without esophagitis: Secondary | ICD-10-CM | POA: Diagnosis not present

## 2016-05-22 DIAGNOSIS — Z811 Family history of alcohol abuse and dependence: Secondary | ICD-10-CM | POA: Diagnosis not present

## 2016-05-22 DIAGNOSIS — Z90710 Acquired absence of both cervix and uterus: Secondary | ICD-10-CM | POA: Diagnosis not present

## 2016-05-22 DIAGNOSIS — K449 Diaphragmatic hernia without obstruction or gangrene: Secondary | ICD-10-CM | POA: Diagnosis not present

## 2016-05-22 DIAGNOSIS — E119 Type 2 diabetes mellitus without complications: Secondary | ICD-10-CM | POA: Diagnosis not present

## 2016-05-22 DIAGNOSIS — Z9049 Acquired absence of other specified parts of digestive tract: Secondary | ICD-10-CM | POA: Diagnosis not present

## 2016-05-22 DIAGNOSIS — Z86010 Personal history of colonic polyps: Secondary | ICD-10-CM | POA: Diagnosis not present

## 2016-05-22 DIAGNOSIS — Z170 Estrogen receptor positive status [ER+]: Secondary | ICD-10-CM | POA: Diagnosis not present

## 2016-05-22 DIAGNOSIS — Z9889 Other specified postprocedural states: Secondary | ICD-10-CM | POA: Diagnosis not present

## 2016-05-22 DIAGNOSIS — Z7982 Long term (current) use of aspirin: Secondary | ICD-10-CM | POA: Diagnosis not present

## 2016-05-22 DIAGNOSIS — Z510 Encounter for antineoplastic radiation therapy: Secondary | ICD-10-CM | POA: Diagnosis not present

## 2016-05-25 ENCOUNTER — Ambulatory Visit
Admission: RE | Admit: 2016-05-25 | Discharge: 2016-05-25 | Disposition: A | Discharge status: Home / Self Care | Payer: BLUE CROSS/BLUE SHIELD | Source: Ambulatory Visit | Attending: Radiation Oncology | Admitting: Radiation Oncology

## 2016-05-25 ENCOUNTER — Ambulatory Visit: Payer: BLUE CROSS/BLUE SHIELD

## 2016-05-25 VITALS — BP 143/81 | HR 85 | Temp 98.3°F | Ht 64.5 in | Wt 166.5 lb

## 2016-05-25 DIAGNOSIS — Z9889 Other specified postprocedural states: Secondary | ICD-10-CM | POA: Diagnosis not present

## 2016-05-25 DIAGNOSIS — Z833 Family history of diabetes mellitus: Secondary | ICD-10-CM | POA: Diagnosis not present

## 2016-05-25 DIAGNOSIS — Z8249 Family history of ischemic heart disease and other diseases of the circulatory system: Secondary | ICD-10-CM | POA: Diagnosis not present

## 2016-05-25 DIAGNOSIS — Z90710 Acquired absence of both cervix and uterus: Secondary | ICD-10-CM | POA: Diagnosis not present

## 2016-05-25 DIAGNOSIS — Z170 Estrogen receptor positive status [ER+]: Secondary | ICD-10-CM | POA: Insufficient documentation

## 2016-05-25 DIAGNOSIS — Z7982 Long term (current) use of aspirin: Secondary | ICD-10-CM | POA: Diagnosis not present

## 2016-05-25 DIAGNOSIS — Z510 Encounter for antineoplastic radiation therapy: Secondary | ICD-10-CM | POA: Diagnosis not present

## 2016-05-25 DIAGNOSIS — K219 Gastro-esophageal reflux disease without esophagitis: Secondary | ICD-10-CM | POA: Diagnosis not present

## 2016-05-25 DIAGNOSIS — C50412 Malignant neoplasm of upper-outer quadrant of left female breast: Secondary | ICD-10-CM | POA: Insufficient documentation

## 2016-05-25 DIAGNOSIS — Z9049 Acquired absence of other specified parts of digestive tract: Secondary | ICD-10-CM | POA: Diagnosis not present

## 2016-05-25 DIAGNOSIS — I10 Essential (primary) hypertension: Secondary | ICD-10-CM | POA: Diagnosis not present

## 2016-05-25 DIAGNOSIS — K449 Diaphragmatic hernia without obstruction or gangrene: Secondary | ICD-10-CM | POA: Diagnosis not present

## 2016-05-25 DIAGNOSIS — Z923 Personal history of irradiation: Secondary | ICD-10-CM | POA: Diagnosis not present

## 2016-05-25 DIAGNOSIS — E119 Type 2 diabetes mellitus without complications: Secondary | ICD-10-CM | POA: Diagnosis not present

## 2016-05-25 DIAGNOSIS — Z86010 Personal history of colonic polyps: Secondary | ICD-10-CM | POA: Diagnosis not present

## 2016-05-25 DIAGNOSIS — Z811 Family history of alcohol abuse and dependence: Secondary | ICD-10-CM | POA: Diagnosis not present

## 2016-05-25 DIAGNOSIS — Z7984 Long term (current) use of oral hypoglycemic drugs: Secondary | ICD-10-CM | POA: Diagnosis not present

## 2016-05-25 MED ORDER — RADIAPLEXRX EX GEL
Freq: Once | CUTANEOUS | Status: AC
  Administered 2016-05-25: 17:00:00 via TOPICAL

## 2016-05-25 NOTE — Progress Notes (Signed)
   Weekly Management Note:  Outpatient    ICD-9-CM ICD-10-CM   1. Malignant neoplasm of upper-outer quadrant of left breast in female, estrogen receptor positive ([facility]) 174.4 C50.412    V86.0 Z17.0     Current Dose:  10 Gy  Projected Dose: 60 Gy   Narrative:  The patient presents for routine under treatment assessment.  CBCT/MVCT images/Port film x-rays were reviewed.  The chart was checked. Doing well. Saw Dr. Marlou Starks today and he did not think she had an infection but has some swelling and erythema especially at the seroma.  Physical Findings:  height is 5' 4.5" (1.638 m) and weight is 166 lb 8 oz (75.5 kg). Her temperature is 98.3 F (36.8 C). Her blood pressure is 143/81 (abnormal) and her pulse is 85. Her oxygen saturation is 100%.   Wt Readings from Last 3 Encounters:  05/25/16 166 lb 8 oz (75.5 kg)  05/21/16 168 lb 12.8 oz (76.6 kg)  04/23/16 170 lb (77.1 kg)   NAD, erythema of left breast particularly at UOQ seroma. No significant warmth.    Impression:  The patient is tolerating radiotherapy.  Plan:  Continue radiotherapy as planned. Patient instructed to apply Radiplex to intact skin in treatment fields.   ________________________________   Eppie Gibson, M.D.

## 2016-05-25 NOTE — Progress Notes (Signed)
Ms. Moravek is here for her 5th fraction of radiation to her Left Breast. She denies pain. She does have some mild fatigue. Her Left Breast is slightly pink. She was provided education today, and will begin using Radiaplex today as directed twice daily. She received Herceptin last Thursday and will continue every 3 weeks until the end of June.   BP (!) 143/81   Pulse 85   Temp 98.3 F (36.8 C)   Ht 5' 4.5" (1.638 m)   Wt 166 lb 8 oz (75.5 kg)   SpO2 100% Comment: room air  BMI 28.14 kg/m    Wt Readings from Last 3 Encounters:  05/25/16 166 lb 8 oz (75.5 kg)  05/21/16 168 lb 12.8 oz (76.6 kg)  04/23/16 170 lb (77.1 kg)

## 2016-05-25 NOTE — Progress Notes (Signed)

## 2016-05-26 ENCOUNTER — Ambulatory Visit
Admission: RE | Admit: 2016-05-26 | Discharge: 2016-05-26 | Disposition: A | Discharge status: Home / Self Care | Payer: BLUE CROSS/BLUE SHIELD | Source: Ambulatory Visit | Attending: Radiation Oncology | Admitting: Radiation Oncology

## 2016-05-26 ENCOUNTER — Ambulatory Visit: Payer: BLUE CROSS/BLUE SHIELD

## 2016-05-26 DIAGNOSIS — Z833 Family history of diabetes mellitus: Secondary | ICD-10-CM | POA: Diagnosis not present

## 2016-05-26 DIAGNOSIS — Z8249 Family history of ischemic heart disease and other diseases of the circulatory system: Secondary | ICD-10-CM | POA: Diagnosis not present

## 2016-05-26 DIAGNOSIS — K219 Gastro-esophageal reflux disease without esophagitis: Secondary | ICD-10-CM | POA: Diagnosis not present

## 2016-05-26 DIAGNOSIS — Z7984 Long term (current) use of oral hypoglycemic drugs: Secondary | ICD-10-CM | POA: Diagnosis not present

## 2016-05-26 DIAGNOSIS — C50412 Malignant neoplasm of upper-outer quadrant of left female breast: Secondary | ICD-10-CM | POA: Diagnosis not present

## 2016-05-26 DIAGNOSIS — Z170 Estrogen receptor positive status [ER+]: Secondary | ICD-10-CM | POA: Diagnosis not present

## 2016-05-26 DIAGNOSIS — K449 Diaphragmatic hernia without obstruction or gangrene: Secondary | ICD-10-CM | POA: Diagnosis not present

## 2016-05-26 DIAGNOSIS — E119 Type 2 diabetes mellitus without complications: Secondary | ICD-10-CM | POA: Diagnosis not present

## 2016-05-26 DIAGNOSIS — Z86010 Personal history of colonic polyps: Secondary | ICD-10-CM | POA: Diagnosis not present

## 2016-05-26 DIAGNOSIS — Z9049 Acquired absence of other specified parts of digestive tract: Secondary | ICD-10-CM | POA: Diagnosis not present

## 2016-05-26 DIAGNOSIS — Z510 Encounter for antineoplastic radiation therapy: Secondary | ICD-10-CM | POA: Diagnosis not present

## 2016-05-26 DIAGNOSIS — Z9889 Other specified postprocedural states: Secondary | ICD-10-CM | POA: Diagnosis not present

## 2016-05-26 DIAGNOSIS — Z90710 Acquired absence of both cervix and uterus: Secondary | ICD-10-CM | POA: Diagnosis not present

## 2016-05-26 DIAGNOSIS — Z7982 Long term (current) use of aspirin: Secondary | ICD-10-CM | POA: Diagnosis not present

## 2016-05-26 DIAGNOSIS — I10 Essential (primary) hypertension: Secondary | ICD-10-CM | POA: Diagnosis not present

## 2016-05-26 DIAGNOSIS — Z811 Family history of alcohol abuse and dependence: Secondary | ICD-10-CM | POA: Diagnosis not present

## 2016-05-27 ENCOUNTER — Ambulatory Visit
Admission: RE | Admit: 2016-05-27 | Discharge: 2016-05-27 | Disposition: A | Discharge status: Home / Self Care | Payer: BLUE CROSS/BLUE SHIELD | Source: Ambulatory Visit | Attending: Radiation Oncology | Admitting: Radiation Oncology

## 2016-05-27 DIAGNOSIS — C50412 Malignant neoplasm of upper-outer quadrant of left female breast: Secondary | ICD-10-CM | POA: Diagnosis not present

## 2016-05-27 DIAGNOSIS — Z510 Encounter for antineoplastic radiation therapy: Secondary | ICD-10-CM | POA: Diagnosis not present

## 2016-05-27 DIAGNOSIS — Z170 Estrogen receptor positive status [ER+]: Secondary | ICD-10-CM | POA: Diagnosis not present

## 2016-05-27 DIAGNOSIS — I10 Essential (primary) hypertension: Secondary | ICD-10-CM | POA: Diagnosis not present

## 2016-05-27 DIAGNOSIS — E119 Type 2 diabetes mellitus without complications: Secondary | ICD-10-CM | POA: Diagnosis not present

## 2016-05-27 DIAGNOSIS — Z811 Family history of alcohol abuse and dependence: Secondary | ICD-10-CM | POA: Diagnosis not present

## 2016-05-27 DIAGNOSIS — Z7982 Long term (current) use of aspirin: Secondary | ICD-10-CM | POA: Diagnosis not present

## 2016-05-27 DIAGNOSIS — Z7984 Long term (current) use of oral hypoglycemic drugs: Secondary | ICD-10-CM | POA: Diagnosis not present

## 2016-05-27 DIAGNOSIS — Z833 Family history of diabetes mellitus: Secondary | ICD-10-CM | POA: Diagnosis not present

## 2016-05-27 DIAGNOSIS — Z8249 Family history of ischemic heart disease and other diseases of the circulatory system: Secondary | ICD-10-CM | POA: Diagnosis not present

## 2016-05-27 DIAGNOSIS — Z90710 Acquired absence of both cervix and uterus: Secondary | ICD-10-CM | POA: Diagnosis not present

## 2016-05-27 DIAGNOSIS — K449 Diaphragmatic hernia without obstruction or gangrene: Secondary | ICD-10-CM | POA: Diagnosis not present

## 2016-05-27 DIAGNOSIS — K219 Gastro-esophageal reflux disease without esophagitis: Secondary | ICD-10-CM | POA: Diagnosis not present

## 2016-05-27 DIAGNOSIS — Z9889 Other specified postprocedural states: Secondary | ICD-10-CM | POA: Diagnosis not present

## 2016-05-27 DIAGNOSIS — Z9049 Acquired absence of other specified parts of digestive tract: Secondary | ICD-10-CM | POA: Diagnosis not present

## 2016-05-27 DIAGNOSIS — Z86010 Personal history of colonic polyps: Secondary | ICD-10-CM | POA: Diagnosis not present

## 2016-05-28 ENCOUNTER — Ambulatory Visit
Admission: RE | Admit: 2016-05-28 | Discharge: 2016-05-28 | Disposition: A | Discharge status: Home / Self Care | Payer: BLUE CROSS/BLUE SHIELD | Source: Ambulatory Visit | Attending: Radiation Oncology | Admitting: Radiation Oncology

## 2016-05-28 DIAGNOSIS — E119 Type 2 diabetes mellitus without complications: Secondary | ICD-10-CM | POA: Diagnosis not present

## 2016-05-28 DIAGNOSIS — Z811 Family history of alcohol abuse and dependence: Secondary | ICD-10-CM | POA: Diagnosis not present

## 2016-05-28 DIAGNOSIS — K449 Diaphragmatic hernia without obstruction or gangrene: Secondary | ICD-10-CM | POA: Diagnosis not present

## 2016-05-28 DIAGNOSIS — Z9049 Acquired absence of other specified parts of digestive tract: Secondary | ICD-10-CM | POA: Diagnosis not present

## 2016-05-28 DIAGNOSIS — Z8249 Family history of ischemic heart disease and other diseases of the circulatory system: Secondary | ICD-10-CM | POA: Diagnosis not present

## 2016-05-28 DIAGNOSIS — Z86010 Personal history of colonic polyps: Secondary | ICD-10-CM | POA: Diagnosis not present

## 2016-05-28 DIAGNOSIS — I10 Essential (primary) hypertension: Secondary | ICD-10-CM | POA: Diagnosis not present

## 2016-05-28 DIAGNOSIS — Z90710 Acquired absence of both cervix and uterus: Secondary | ICD-10-CM | POA: Diagnosis not present

## 2016-05-28 DIAGNOSIS — K219 Gastro-esophageal reflux disease without esophagitis: Secondary | ICD-10-CM | POA: Diagnosis not present

## 2016-05-28 DIAGNOSIS — Z7984 Long term (current) use of oral hypoglycemic drugs: Secondary | ICD-10-CM | POA: Diagnosis not present

## 2016-05-28 DIAGNOSIS — Z7982 Long term (current) use of aspirin: Secondary | ICD-10-CM | POA: Diagnosis not present

## 2016-05-28 DIAGNOSIS — Z170 Estrogen receptor positive status [ER+]: Secondary | ICD-10-CM | POA: Diagnosis not present

## 2016-05-28 DIAGNOSIS — Z833 Family history of diabetes mellitus: Secondary | ICD-10-CM | POA: Diagnosis not present

## 2016-05-28 DIAGNOSIS — Z9889 Other specified postprocedural states: Secondary | ICD-10-CM | POA: Diagnosis not present

## 2016-05-28 DIAGNOSIS — E1149 Type 2 diabetes mellitus with other diabetic neurological complication: Secondary | ICD-10-CM | POA: Diagnosis not present

## 2016-05-28 DIAGNOSIS — G629 Polyneuropathy, unspecified: Secondary | ICD-10-CM | POA: Diagnosis not present

## 2016-05-28 DIAGNOSIS — Z510 Encounter for antineoplastic radiation therapy: Secondary | ICD-10-CM | POA: Diagnosis not present

## 2016-05-28 DIAGNOSIS — C50412 Malignant neoplasm of upper-outer quadrant of left female breast: Secondary | ICD-10-CM | POA: Diagnosis not present

## 2016-05-28 DIAGNOSIS — E7800 Pure hypercholesterolemia, unspecified: Secondary | ICD-10-CM | POA: Diagnosis not present

## 2016-05-29 ENCOUNTER — Ambulatory Visit
Admission: RE | Admit: 2016-05-29 | Discharge: 2016-05-29 | Disposition: A | Discharge status: Home / Self Care | Payer: BLUE CROSS/BLUE SHIELD | Source: Ambulatory Visit | Attending: Radiation Oncology | Admitting: Radiation Oncology

## 2016-05-29 DIAGNOSIS — E119 Type 2 diabetes mellitus without complications: Secondary | ICD-10-CM | POA: Diagnosis not present

## 2016-05-29 DIAGNOSIS — Z8249 Family history of ischemic heart disease and other diseases of the circulatory system: Secondary | ICD-10-CM | POA: Diagnosis not present

## 2016-05-29 DIAGNOSIS — Z90710 Acquired absence of both cervix and uterus: Secondary | ICD-10-CM | POA: Diagnosis not present

## 2016-05-29 DIAGNOSIS — Z7982 Long term (current) use of aspirin: Secondary | ICD-10-CM | POA: Diagnosis not present

## 2016-05-29 DIAGNOSIS — Z833 Family history of diabetes mellitus: Secondary | ICD-10-CM | POA: Diagnosis not present

## 2016-05-29 DIAGNOSIS — I10 Essential (primary) hypertension: Secondary | ICD-10-CM | POA: Diagnosis not present

## 2016-05-29 DIAGNOSIS — Z7984 Long term (current) use of oral hypoglycemic drugs: Secondary | ICD-10-CM | POA: Diagnosis not present

## 2016-05-29 DIAGNOSIS — Z510 Encounter for antineoplastic radiation therapy: Secondary | ICD-10-CM | POA: Diagnosis not present

## 2016-05-29 DIAGNOSIS — K449 Diaphragmatic hernia without obstruction or gangrene: Secondary | ICD-10-CM | POA: Diagnosis not present

## 2016-05-29 DIAGNOSIS — K219 Gastro-esophageal reflux disease without esophagitis: Secondary | ICD-10-CM | POA: Diagnosis not present

## 2016-05-29 DIAGNOSIS — Z811 Family history of alcohol abuse and dependence: Secondary | ICD-10-CM | POA: Diagnosis not present

## 2016-05-29 DIAGNOSIS — Z86010 Personal history of colonic polyps: Secondary | ICD-10-CM | POA: Diagnosis not present

## 2016-05-29 DIAGNOSIS — Z170 Estrogen receptor positive status [ER+]: Secondary | ICD-10-CM | POA: Diagnosis not present

## 2016-05-29 DIAGNOSIS — Z9889 Other specified postprocedural states: Secondary | ICD-10-CM | POA: Diagnosis not present

## 2016-05-29 DIAGNOSIS — Z9049 Acquired absence of other specified parts of digestive tract: Secondary | ICD-10-CM | POA: Diagnosis not present

## 2016-05-29 DIAGNOSIS — C50412 Malignant neoplasm of upper-outer quadrant of left female breast: Secondary | ICD-10-CM | POA: Diagnosis not present

## 2016-06-01 ENCOUNTER — Ambulatory Visit
Admission: RE | Admit: 2016-06-01 | Discharge: 2016-06-01 | Disposition: A | Discharge status: Home / Self Care | Payer: BLUE CROSS/BLUE SHIELD | Source: Ambulatory Visit | Attending: Radiation Oncology | Admitting: Radiation Oncology

## 2016-06-01 DIAGNOSIS — Z86010 Personal history of colonic polyps: Secondary | ICD-10-CM | POA: Diagnosis not present

## 2016-06-01 DIAGNOSIS — Z833 Family history of diabetes mellitus: Secondary | ICD-10-CM | POA: Diagnosis not present

## 2016-06-01 DIAGNOSIS — Z811 Family history of alcohol abuse and dependence: Secondary | ICD-10-CM | POA: Diagnosis not present

## 2016-06-01 DIAGNOSIS — C50412 Malignant neoplasm of upper-outer quadrant of left female breast: Secondary | ICD-10-CM | POA: Diagnosis not present

## 2016-06-01 DIAGNOSIS — E119 Type 2 diabetes mellitus without complications: Secondary | ICD-10-CM | POA: Diagnosis not present

## 2016-06-01 DIAGNOSIS — Z90710 Acquired absence of both cervix and uterus: Secondary | ICD-10-CM | POA: Diagnosis not present

## 2016-06-01 DIAGNOSIS — I10 Essential (primary) hypertension: Secondary | ICD-10-CM | POA: Diagnosis not present

## 2016-06-01 DIAGNOSIS — Z9889 Other specified postprocedural states: Secondary | ICD-10-CM | POA: Diagnosis not present

## 2016-06-01 DIAGNOSIS — Z9049 Acquired absence of other specified parts of digestive tract: Secondary | ICD-10-CM | POA: Diagnosis not present

## 2016-06-01 DIAGNOSIS — Z510 Encounter for antineoplastic radiation therapy: Secondary | ICD-10-CM | POA: Diagnosis not present

## 2016-06-01 DIAGNOSIS — Z7984 Long term (current) use of oral hypoglycemic drugs: Secondary | ICD-10-CM | POA: Diagnosis not present

## 2016-06-01 DIAGNOSIS — K449 Diaphragmatic hernia without obstruction or gangrene: Secondary | ICD-10-CM | POA: Diagnosis not present

## 2016-06-01 DIAGNOSIS — K219 Gastro-esophageal reflux disease without esophagitis: Secondary | ICD-10-CM | POA: Diagnosis not present

## 2016-06-01 DIAGNOSIS — Z8249 Family history of ischemic heart disease and other diseases of the circulatory system: Secondary | ICD-10-CM | POA: Diagnosis not present

## 2016-06-01 DIAGNOSIS — Z7982 Long term (current) use of aspirin: Secondary | ICD-10-CM | POA: Diagnosis not present

## 2016-06-01 DIAGNOSIS — Z170 Estrogen receptor positive status [ER+]: Secondary | ICD-10-CM | POA: Diagnosis not present

## 2016-06-02 ENCOUNTER — Ambulatory Visit
Admission: RE | Admit: 2016-06-02 | Discharge: 2016-06-02 | Disposition: A | Discharge status: Home / Self Care | Payer: BLUE CROSS/BLUE SHIELD | Source: Ambulatory Visit | Attending: Radiation Oncology | Admitting: Radiation Oncology

## 2016-06-02 DIAGNOSIS — Z7982 Long term (current) use of aspirin: Secondary | ICD-10-CM | POA: Diagnosis not present

## 2016-06-02 DIAGNOSIS — Z510 Encounter for antineoplastic radiation therapy: Secondary | ICD-10-CM | POA: Diagnosis not present

## 2016-06-02 DIAGNOSIS — C50412 Malignant neoplasm of upper-outer quadrant of left female breast: Secondary | ICD-10-CM | POA: Diagnosis not present

## 2016-06-02 DIAGNOSIS — E119 Type 2 diabetes mellitus without complications: Secondary | ICD-10-CM | POA: Diagnosis not present

## 2016-06-02 DIAGNOSIS — Z811 Family history of alcohol abuse and dependence: Secondary | ICD-10-CM | POA: Diagnosis not present

## 2016-06-02 DIAGNOSIS — Z170 Estrogen receptor positive status [ER+]: Secondary | ICD-10-CM | POA: Diagnosis not present

## 2016-06-02 DIAGNOSIS — Z90710 Acquired absence of both cervix and uterus: Secondary | ICD-10-CM | POA: Diagnosis not present

## 2016-06-02 DIAGNOSIS — I10 Essential (primary) hypertension: Secondary | ICD-10-CM | POA: Diagnosis not present

## 2016-06-02 DIAGNOSIS — Z9049 Acquired absence of other specified parts of digestive tract: Secondary | ICD-10-CM | POA: Diagnosis not present

## 2016-06-02 DIAGNOSIS — Z8249 Family history of ischemic heart disease and other diseases of the circulatory system: Secondary | ICD-10-CM | POA: Diagnosis not present

## 2016-06-02 DIAGNOSIS — K449 Diaphragmatic hernia without obstruction or gangrene: Secondary | ICD-10-CM | POA: Diagnosis not present

## 2016-06-02 DIAGNOSIS — Z86010 Personal history of colonic polyps: Secondary | ICD-10-CM | POA: Diagnosis not present

## 2016-06-02 DIAGNOSIS — Z7984 Long term (current) use of oral hypoglycemic drugs: Secondary | ICD-10-CM | POA: Diagnosis not present

## 2016-06-02 DIAGNOSIS — K219 Gastro-esophageal reflux disease without esophagitis: Secondary | ICD-10-CM | POA: Diagnosis not present

## 2016-06-02 DIAGNOSIS — Z833 Family history of diabetes mellitus: Secondary | ICD-10-CM | POA: Diagnosis not present

## 2016-06-02 DIAGNOSIS — Z9889 Other specified postprocedural states: Secondary | ICD-10-CM | POA: Diagnosis not present

## 2016-06-03 ENCOUNTER — Ambulatory Visit
Admission: RE | Admit: 2016-06-03 | Discharge: 2016-06-03 | Disposition: A | Discharge status: Home / Self Care | Payer: BLUE CROSS/BLUE SHIELD | Source: Ambulatory Visit | Attending: Radiation Oncology | Admitting: Radiation Oncology

## 2016-06-03 ENCOUNTER — Telehealth: Payer: Self-pay | Admitting: Oncology

## 2016-06-03 DIAGNOSIS — Z9049 Acquired absence of other specified parts of digestive tract: Secondary | ICD-10-CM | POA: Diagnosis not present

## 2016-06-03 DIAGNOSIS — Z811 Family history of alcohol abuse and dependence: Secondary | ICD-10-CM | POA: Diagnosis not present

## 2016-06-03 DIAGNOSIS — K449 Diaphragmatic hernia without obstruction or gangrene: Secondary | ICD-10-CM | POA: Diagnosis not present

## 2016-06-03 DIAGNOSIS — I10 Essential (primary) hypertension: Secondary | ICD-10-CM | POA: Diagnosis not present

## 2016-06-03 DIAGNOSIS — Z86010 Personal history of colonic polyps: Secondary | ICD-10-CM | POA: Diagnosis not present

## 2016-06-03 DIAGNOSIS — Z170 Estrogen receptor positive status [ER+]: Secondary | ICD-10-CM | POA: Diagnosis not present

## 2016-06-03 DIAGNOSIS — Z8249 Family history of ischemic heart disease and other diseases of the circulatory system: Secondary | ICD-10-CM | POA: Diagnosis not present

## 2016-06-03 DIAGNOSIS — E119 Type 2 diabetes mellitus without complications: Secondary | ICD-10-CM | POA: Diagnosis not present

## 2016-06-03 DIAGNOSIS — C50412 Malignant neoplasm of upper-outer quadrant of left female breast: Secondary | ICD-10-CM | POA: Diagnosis not present

## 2016-06-03 DIAGNOSIS — Z90710 Acquired absence of both cervix and uterus: Secondary | ICD-10-CM | POA: Diagnosis not present

## 2016-06-03 DIAGNOSIS — K219 Gastro-esophageal reflux disease without esophagitis: Secondary | ICD-10-CM | POA: Diagnosis not present

## 2016-06-03 DIAGNOSIS — Z833 Family history of diabetes mellitus: Secondary | ICD-10-CM | POA: Diagnosis not present

## 2016-06-03 DIAGNOSIS — Z9889 Other specified postprocedural states: Secondary | ICD-10-CM | POA: Diagnosis not present

## 2016-06-03 DIAGNOSIS — Z7984 Long term (current) use of oral hypoglycemic drugs: Secondary | ICD-10-CM | POA: Diagnosis not present

## 2016-06-03 DIAGNOSIS — Z7982 Long term (current) use of aspirin: Secondary | ICD-10-CM | POA: Diagnosis not present

## 2016-06-03 DIAGNOSIS — Z510 Encounter for antineoplastic radiation therapy: Secondary | ICD-10-CM | POA: Diagnosis not present

## 2016-06-03 NOTE — Telephone Encounter (Signed)
Called patient to inform her of added 4.26.18 appointments.

## 2016-06-04 ENCOUNTER — Ambulatory Visit
Admission: RE | Admit: 2016-06-04 | Discharge: 2016-06-04 | Disposition: A | Discharge status: Home / Self Care | Payer: BLUE CROSS/BLUE SHIELD | Source: Ambulatory Visit | Attending: Radiation Oncology | Admitting: Radiation Oncology

## 2016-06-04 DIAGNOSIS — Z9889 Other specified postprocedural states: Secondary | ICD-10-CM | POA: Diagnosis not present

## 2016-06-04 DIAGNOSIS — Z7982 Long term (current) use of aspirin: Secondary | ICD-10-CM | POA: Diagnosis not present

## 2016-06-04 DIAGNOSIS — C50412 Malignant neoplasm of upper-outer quadrant of left female breast: Secondary | ICD-10-CM | POA: Diagnosis not present

## 2016-06-04 DIAGNOSIS — Z7984 Long term (current) use of oral hypoglycemic drugs: Secondary | ICD-10-CM | POA: Diagnosis not present

## 2016-06-04 DIAGNOSIS — Z8249 Family history of ischemic heart disease and other diseases of the circulatory system: Secondary | ICD-10-CM | POA: Diagnosis not present

## 2016-06-04 DIAGNOSIS — K219 Gastro-esophageal reflux disease without esophagitis: Secondary | ICD-10-CM | POA: Diagnosis not present

## 2016-06-04 DIAGNOSIS — Z833 Family history of diabetes mellitus: Secondary | ICD-10-CM | POA: Diagnosis not present

## 2016-06-04 DIAGNOSIS — Z9049 Acquired absence of other specified parts of digestive tract: Secondary | ICD-10-CM | POA: Diagnosis not present

## 2016-06-04 DIAGNOSIS — Z510 Encounter for antineoplastic radiation therapy: Secondary | ICD-10-CM | POA: Diagnosis not present

## 2016-06-04 DIAGNOSIS — K449 Diaphragmatic hernia without obstruction or gangrene: Secondary | ICD-10-CM | POA: Diagnosis not present

## 2016-06-04 DIAGNOSIS — Z170 Estrogen receptor positive status [ER+]: Secondary | ICD-10-CM | POA: Diagnosis not present

## 2016-06-04 DIAGNOSIS — E119 Type 2 diabetes mellitus without complications: Secondary | ICD-10-CM | POA: Diagnosis not present

## 2016-06-04 DIAGNOSIS — I10 Essential (primary) hypertension: Secondary | ICD-10-CM | POA: Diagnosis not present

## 2016-06-04 DIAGNOSIS — Z811 Family history of alcohol abuse and dependence: Secondary | ICD-10-CM | POA: Diagnosis not present

## 2016-06-04 DIAGNOSIS — Z90710 Acquired absence of both cervix and uterus: Secondary | ICD-10-CM | POA: Diagnosis not present

## 2016-06-04 DIAGNOSIS — Z86010 Personal history of colonic polyps: Secondary | ICD-10-CM | POA: Diagnosis not present

## 2016-06-05 ENCOUNTER — Ambulatory Visit
Admission: RE | Admit: 2016-06-05 | Discharge: 2016-06-05 | Disposition: A | Discharge status: Home / Self Care | Payer: BLUE CROSS/BLUE SHIELD | Source: Ambulatory Visit | Attending: Radiation Oncology | Admitting: Radiation Oncology

## 2016-06-05 DIAGNOSIS — Z90710 Acquired absence of both cervix and uterus: Secondary | ICD-10-CM | POA: Diagnosis not present

## 2016-06-05 DIAGNOSIS — Z833 Family history of diabetes mellitus: Secondary | ICD-10-CM | POA: Diagnosis not present

## 2016-06-05 DIAGNOSIS — K449 Diaphragmatic hernia without obstruction or gangrene: Secondary | ICD-10-CM | POA: Diagnosis not present

## 2016-06-05 DIAGNOSIS — Z7984 Long term (current) use of oral hypoglycemic drugs: Secondary | ICD-10-CM | POA: Diagnosis not present

## 2016-06-05 DIAGNOSIS — Z7982 Long term (current) use of aspirin: Secondary | ICD-10-CM | POA: Diagnosis not present

## 2016-06-05 DIAGNOSIS — E119 Type 2 diabetes mellitus without complications: Secondary | ICD-10-CM | POA: Diagnosis not present

## 2016-06-05 DIAGNOSIS — Z510 Encounter for antineoplastic radiation therapy: Secondary | ICD-10-CM | POA: Diagnosis not present

## 2016-06-05 DIAGNOSIS — Z811 Family history of alcohol abuse and dependence: Secondary | ICD-10-CM | POA: Diagnosis not present

## 2016-06-05 DIAGNOSIS — Z86010 Personal history of colonic polyps: Secondary | ICD-10-CM | POA: Diagnosis not present

## 2016-06-05 DIAGNOSIS — Z8249 Family history of ischemic heart disease and other diseases of the circulatory system: Secondary | ICD-10-CM | POA: Diagnosis not present

## 2016-06-05 DIAGNOSIS — Z9049 Acquired absence of other specified parts of digestive tract: Secondary | ICD-10-CM | POA: Diagnosis not present

## 2016-06-05 DIAGNOSIS — I10 Essential (primary) hypertension: Secondary | ICD-10-CM | POA: Diagnosis not present

## 2016-06-05 DIAGNOSIS — K219 Gastro-esophageal reflux disease without esophagitis: Secondary | ICD-10-CM | POA: Diagnosis not present

## 2016-06-05 DIAGNOSIS — Z170 Estrogen receptor positive status [ER+]: Secondary | ICD-10-CM | POA: Diagnosis not present

## 2016-06-05 DIAGNOSIS — Z9889 Other specified postprocedural states: Secondary | ICD-10-CM | POA: Diagnosis not present

## 2016-06-05 DIAGNOSIS — C50412 Malignant neoplasm of upper-outer quadrant of left female breast: Secondary | ICD-10-CM | POA: Diagnosis not present

## 2016-06-08 ENCOUNTER — Ambulatory Visit
Admission: RE | Admit: 2016-06-08 | Discharge: 2016-06-08 | Disposition: A | Discharge status: Home / Self Care | Payer: BLUE CROSS/BLUE SHIELD | Source: Ambulatory Visit | Attending: Radiation Oncology | Admitting: Radiation Oncology

## 2016-06-08 VITALS — BP 135/76 | HR 86 | Temp 98.1°F | Resp 18 | Wt 166.6 lb

## 2016-06-08 DIAGNOSIS — K449 Diaphragmatic hernia without obstruction or gangrene: Secondary | ICD-10-CM | POA: Diagnosis not present

## 2016-06-08 DIAGNOSIS — Z9049 Acquired absence of other specified parts of digestive tract: Secondary | ICD-10-CM | POA: Diagnosis not present

## 2016-06-08 DIAGNOSIS — K219 Gastro-esophageal reflux disease without esophagitis: Secondary | ICD-10-CM | POA: Diagnosis not present

## 2016-06-08 DIAGNOSIS — Z170 Estrogen receptor positive status [ER+]: Secondary | ICD-10-CM | POA: Diagnosis not present

## 2016-06-08 DIAGNOSIS — Z8249 Family history of ischemic heart disease and other diseases of the circulatory system: Secondary | ICD-10-CM | POA: Diagnosis not present

## 2016-06-08 DIAGNOSIS — Z9889 Other specified postprocedural states: Secondary | ICD-10-CM | POA: Diagnosis not present

## 2016-06-08 DIAGNOSIS — Z86010 Personal history of colonic polyps: Secondary | ICD-10-CM | POA: Diagnosis not present

## 2016-06-08 DIAGNOSIS — Z510 Encounter for antineoplastic radiation therapy: Secondary | ICD-10-CM | POA: Diagnosis not present

## 2016-06-08 DIAGNOSIS — Z811 Family history of alcohol abuse and dependence: Secondary | ICD-10-CM | POA: Diagnosis not present

## 2016-06-08 DIAGNOSIS — C50412 Malignant neoplasm of upper-outer quadrant of left female breast: Secondary | ICD-10-CM | POA: Diagnosis not present

## 2016-06-08 DIAGNOSIS — Z90710 Acquired absence of both cervix and uterus: Secondary | ICD-10-CM | POA: Diagnosis not present

## 2016-06-08 DIAGNOSIS — E119 Type 2 diabetes mellitus without complications: Secondary | ICD-10-CM | POA: Diagnosis not present

## 2016-06-08 DIAGNOSIS — Z7982 Long term (current) use of aspirin: Secondary | ICD-10-CM | POA: Diagnosis not present

## 2016-06-08 DIAGNOSIS — Z7984 Long term (current) use of oral hypoglycemic drugs: Secondary | ICD-10-CM | POA: Diagnosis not present

## 2016-06-08 DIAGNOSIS — I10 Essential (primary) hypertension: Secondary | ICD-10-CM | POA: Diagnosis not present

## 2016-06-08 DIAGNOSIS — Z833 Family history of diabetes mellitus: Secondary | ICD-10-CM | POA: Diagnosis not present

## 2016-06-08 NOTE — Progress Notes (Signed)
   Weekly Management Note:  Outpatient    ICD-9-CM ICD-10-CM   1. Malignant neoplasm of upper-outer quadrant of left breast in female, estrogen receptor positive ([facility]) 174.4 C50.412    V86.0 Z17.0     Current Dose:  30 Gy  Projected Dose: 60 Gy   Narrative:  The patient presents for routine under treatment assessment.  CBCT/MVCT images/Port film x-rays were reviewed.  The chart was checked. Patient reports pain in the foot for which she will follow up with orthopedics. She reports good energy level and appetite. She is using Radiaplex gel to the left breast bid. Per nursing, skin to the left breast is red with some irritation along the left axilla.  Physical Findings:  weight is 166 lb 9.6 oz (75.6 kg). Her oral temperature is 98.1 F (36.7 C). Her blood pressure is 135/76 and her pulse is 86. Her respiration is 18 and oxygen saturation is 99%.   Wt Readings from Last 3 Encounters:  06/08/16 166 lb 9.6 oz (75.6 kg)  05/25/16 166 lb 8 oz (75.5 kg)  05/21/16 168 lb 12.8 oz (76.6 kg)   Lungs are clear to auscultation bilaterally. Heart has regular rate and rhythm. Erythema in the left breast and supraclavicular/axillary area.  Impression:  The patient is tolerating radiotherapy.  Plan:  Continue radiotherapy as planned. Patient instructed to apply Radiplex to intact skin in treatment fields.   ________________________________   This document serves as a record of services personally performed by Gery Pray, MD. It was created on his behalf by Bethann Humble, a trained medical scribe. The creation of this record is based on the scribe's personal observations and the provider's statements to them. This document has been checked and approved by the attending provider.

## 2016-06-08 NOTE — Progress Notes (Signed)
Assunta Curtis completed 15th fraction of radiation to left breast/SCLV/PAB area.  Patient reports having pain only in foot that she is trying to get an appointment with orthopedic MD for since podiatrist was unable to offer any insight.  Patient reports having sufficient energy.  Patient reports having a good appetite.  Using radiaplex gel twice daily to left breast.  Skin to left breast is red with some irritation under left breast and left axilla.    Vitals:   06/08/16 1613  BP: 135/76  Pulse: 86  Resp: 18  Temp: 98.1 F (36.7 C)  TempSrc: Oral  SpO2: 99%  Weight: 166 lb 9.6 oz (75.6 kg)   Wt Readings from Last 3 Encounters:  06/08/16 166 lb 9.6 oz (75.6 kg)  05/25/16 166 lb 8 oz (75.5 kg)  05/21/16 168 lb 12.8 oz (76.6 kg)

## 2016-06-09 ENCOUNTER — Ambulatory Visit
Admission: RE | Admit: 2016-06-09 | Discharge: 2016-06-09 | Disposition: A | Discharge status: Home / Self Care | Payer: BLUE CROSS/BLUE SHIELD | Source: Ambulatory Visit | Attending: Radiation Oncology | Admitting: Radiation Oncology

## 2016-06-09 DIAGNOSIS — Z510 Encounter for antineoplastic radiation therapy: Secondary | ICD-10-CM | POA: Diagnosis not present

## 2016-06-09 DIAGNOSIS — C50412 Malignant neoplasm of upper-outer quadrant of left female breast: Secondary | ICD-10-CM | POA: Diagnosis not present

## 2016-06-09 DIAGNOSIS — K219 Gastro-esophageal reflux disease without esophagitis: Secondary | ICD-10-CM | POA: Diagnosis not present

## 2016-06-09 DIAGNOSIS — Z811 Family history of alcohol abuse and dependence: Secondary | ICD-10-CM | POA: Diagnosis not present

## 2016-06-09 DIAGNOSIS — Z7984 Long term (current) use of oral hypoglycemic drugs: Secondary | ICD-10-CM | POA: Diagnosis not present

## 2016-06-09 DIAGNOSIS — E119 Type 2 diabetes mellitus without complications: Secondary | ICD-10-CM | POA: Diagnosis not present

## 2016-06-09 DIAGNOSIS — Z86010 Personal history of colonic polyps: Secondary | ICD-10-CM | POA: Diagnosis not present

## 2016-06-09 DIAGNOSIS — Z90710 Acquired absence of both cervix and uterus: Secondary | ICD-10-CM | POA: Diagnosis not present

## 2016-06-09 DIAGNOSIS — Z833 Family history of diabetes mellitus: Secondary | ICD-10-CM | POA: Diagnosis not present

## 2016-06-09 DIAGNOSIS — Z9889 Other specified postprocedural states: Secondary | ICD-10-CM | POA: Diagnosis not present

## 2016-06-09 DIAGNOSIS — Z170 Estrogen receptor positive status [ER+]: Secondary | ICD-10-CM | POA: Diagnosis not present

## 2016-06-09 DIAGNOSIS — I10 Essential (primary) hypertension: Secondary | ICD-10-CM | POA: Diagnosis not present

## 2016-06-09 DIAGNOSIS — Z7982 Long term (current) use of aspirin: Secondary | ICD-10-CM | POA: Diagnosis not present

## 2016-06-09 DIAGNOSIS — Z9049 Acquired absence of other specified parts of digestive tract: Secondary | ICD-10-CM | POA: Diagnosis not present

## 2016-06-09 DIAGNOSIS — Z8249 Family history of ischemic heart disease and other diseases of the circulatory system: Secondary | ICD-10-CM | POA: Diagnosis not present

## 2016-06-09 DIAGNOSIS — K449 Diaphragmatic hernia without obstruction or gangrene: Secondary | ICD-10-CM | POA: Diagnosis not present

## 2016-06-10 ENCOUNTER — Ambulatory Visit
Admission: RE | Admit: 2016-06-10 | Discharge: 2016-06-10 | Disposition: A | Discharge status: Home / Self Care | Payer: BLUE CROSS/BLUE SHIELD | Source: Ambulatory Visit | Attending: Radiation Oncology | Admitting: Radiation Oncology

## 2016-06-10 DIAGNOSIS — E119 Type 2 diabetes mellitus without complications: Secondary | ICD-10-CM | POA: Diagnosis not present

## 2016-06-10 DIAGNOSIS — Z7984 Long term (current) use of oral hypoglycemic drugs: Secondary | ICD-10-CM | POA: Diagnosis not present

## 2016-06-10 DIAGNOSIS — Z7982 Long term (current) use of aspirin: Secondary | ICD-10-CM | POA: Diagnosis not present

## 2016-06-10 DIAGNOSIS — Z9049 Acquired absence of other specified parts of digestive tract: Secondary | ICD-10-CM | POA: Diagnosis not present

## 2016-06-10 DIAGNOSIS — Z8249 Family history of ischemic heart disease and other diseases of the circulatory system: Secondary | ICD-10-CM | POA: Diagnosis not present

## 2016-06-10 DIAGNOSIS — Z170 Estrogen receptor positive status [ER+]: Secondary | ICD-10-CM | POA: Diagnosis not present

## 2016-06-10 DIAGNOSIS — Z510 Encounter for antineoplastic radiation therapy: Secondary | ICD-10-CM | POA: Diagnosis not present

## 2016-06-10 DIAGNOSIS — I10 Essential (primary) hypertension: Secondary | ICD-10-CM | POA: Diagnosis not present

## 2016-06-10 DIAGNOSIS — Z811 Family history of alcohol abuse and dependence: Secondary | ICD-10-CM | POA: Diagnosis not present

## 2016-06-10 DIAGNOSIS — Z833 Family history of diabetes mellitus: Secondary | ICD-10-CM | POA: Diagnosis not present

## 2016-06-10 DIAGNOSIS — K219 Gastro-esophageal reflux disease without esophagitis: Secondary | ICD-10-CM | POA: Diagnosis not present

## 2016-06-10 DIAGNOSIS — Z90710 Acquired absence of both cervix and uterus: Secondary | ICD-10-CM | POA: Diagnosis not present

## 2016-06-10 DIAGNOSIS — C50412 Malignant neoplasm of upper-outer quadrant of left female breast: Secondary | ICD-10-CM | POA: Diagnosis not present

## 2016-06-10 DIAGNOSIS — Z86010 Personal history of colonic polyps: Secondary | ICD-10-CM | POA: Diagnosis not present

## 2016-06-10 DIAGNOSIS — Z9889 Other specified postprocedural states: Secondary | ICD-10-CM | POA: Diagnosis not present

## 2016-06-10 DIAGNOSIS — K449 Diaphragmatic hernia without obstruction or gangrene: Secondary | ICD-10-CM | POA: Diagnosis not present

## 2016-06-11 ENCOUNTER — Other Ambulatory Visit: Payer: Self-pay | Admitting: Oncology

## 2016-06-11 ENCOUNTER — Ambulatory Visit (HOSPITAL_BASED_OUTPATIENT_CLINIC_OR_DEPARTMENT_OTHER): Payer: BLUE CROSS/BLUE SHIELD

## 2016-06-11 ENCOUNTER — Ambulatory Visit
Admission: RE | Admit: 2016-06-11 | Discharge: 2016-06-11 | Disposition: A | Discharge status: Home / Self Care | Payer: BLUE CROSS/BLUE SHIELD | Source: Ambulatory Visit | Attending: Radiation Oncology | Admitting: Radiation Oncology

## 2016-06-11 ENCOUNTER — Other Ambulatory Visit (HOSPITAL_BASED_OUTPATIENT_CLINIC_OR_DEPARTMENT_OTHER): Payer: BLUE CROSS/BLUE SHIELD

## 2016-06-11 VITALS — BP 130/60 | HR 71 | Temp 98.0°F | Resp 18

## 2016-06-11 DIAGNOSIS — Z5112 Encounter for antineoplastic immunotherapy: Secondary | ICD-10-CM | POA: Diagnosis not present

## 2016-06-11 DIAGNOSIS — C50412 Malignant neoplasm of upper-outer quadrant of left female breast: Secondary | ICD-10-CM | POA: Diagnosis not present

## 2016-06-11 DIAGNOSIS — Z90710 Acquired absence of both cervix and uterus: Secondary | ICD-10-CM | POA: Diagnosis not present

## 2016-06-11 DIAGNOSIS — Z7982 Long term (current) use of aspirin: Secondary | ICD-10-CM | POA: Diagnosis not present

## 2016-06-11 DIAGNOSIS — K219 Gastro-esophageal reflux disease without esophagitis: Secondary | ICD-10-CM | POA: Diagnosis not present

## 2016-06-11 DIAGNOSIS — Z170 Estrogen receptor positive status [ER+]: Secondary | ICD-10-CM

## 2016-06-11 DIAGNOSIS — Z833 Family history of diabetes mellitus: Secondary | ICD-10-CM | POA: Diagnosis not present

## 2016-06-11 DIAGNOSIS — C773 Secondary and unspecified malignant neoplasm of axilla and upper limb lymph nodes: Secondary | ICD-10-CM | POA: Diagnosis not present

## 2016-06-11 DIAGNOSIS — M8580 Other specified disorders of bone density and structure, unspecified site: Secondary | ICD-10-CM

## 2016-06-11 DIAGNOSIS — Z9049 Acquired absence of other specified parts of digestive tract: Secondary | ICD-10-CM | POA: Diagnosis not present

## 2016-06-11 DIAGNOSIS — Z811 Family history of alcohol abuse and dependence: Secondary | ICD-10-CM | POA: Diagnosis not present

## 2016-06-11 DIAGNOSIS — K449 Diaphragmatic hernia without obstruction or gangrene: Secondary | ICD-10-CM | POA: Diagnosis not present

## 2016-06-11 DIAGNOSIS — Z510 Encounter for antineoplastic radiation therapy: Secondary | ICD-10-CM | POA: Diagnosis not present

## 2016-06-11 DIAGNOSIS — I10 Essential (primary) hypertension: Secondary | ICD-10-CM | POA: Diagnosis not present

## 2016-06-11 DIAGNOSIS — Z86010 Personal history of colonic polyps: Secondary | ICD-10-CM | POA: Diagnosis not present

## 2016-06-11 DIAGNOSIS — Z8249 Family history of ischemic heart disease and other diseases of the circulatory system: Secondary | ICD-10-CM | POA: Diagnosis not present

## 2016-06-11 DIAGNOSIS — Z9889 Other specified postprocedural states: Secondary | ICD-10-CM | POA: Diagnosis not present

## 2016-06-11 DIAGNOSIS — E119 Type 2 diabetes mellitus without complications: Secondary | ICD-10-CM | POA: Diagnosis not present

## 2016-06-11 DIAGNOSIS — Z7984 Long term (current) use of oral hypoglycemic drugs: Secondary | ICD-10-CM | POA: Diagnosis not present

## 2016-06-11 LAB — CBC WITH DIFFERENTIAL/PLATELET
BASO%: 0.4 % (ref 0.0–2.0)
Basophils Absolute: 0 10*3/uL (ref 0.0–0.1)
EOS%: 2.6 % (ref 0.0–7.0)
Eosinophils Absolute: 0.1 10*3/uL (ref 0.0–0.5)
HCT: 35.8 % (ref 34.8–46.6)
HGB: 11.2 g/dL — ABNORMAL LOW (ref 11.6–15.9)
LYMPH%: 10.1 % — ABNORMAL LOW (ref 14.0–49.7)
MCH: 25.7 pg (ref 25.1–34.0)
MCHC: 31.3 g/dL — ABNORMAL LOW (ref 31.5–36.0)
MCV: 82.3 fL (ref 79.5–101.0)
MONO#: 0.4 10*3/uL (ref 0.1–0.9)
MONO%: 7.9 % (ref 0.0–14.0)
NEUT#: 4 10*3/uL (ref 1.5–6.5)
NEUT%: 79 % — ABNORMAL HIGH (ref 38.4–76.8)
Platelets: 180 10*3/uL (ref 145–400)
RBC: 4.35 10*6/uL (ref 3.70–5.45)
RDW: 15.1 % — ABNORMAL HIGH (ref 11.2–14.5)
WBC: 5.1 10*3/uL (ref 3.9–10.3)
lymph#: 0.5 10*3/uL — ABNORMAL LOW (ref 0.9–3.3)

## 2016-06-11 LAB — COMPREHENSIVE METABOLIC PANEL
ALT: 30 U/L (ref 0–55)
AST: 25 U/L (ref 5–34)
Albumin: 3.6 g/dL (ref 3.5–5.0)
Alkaline Phosphatase: 164 U/L — ABNORMAL HIGH (ref 40–150)
Anion Gap: 11 mEq/L (ref 3–11)
BUN: 11.6 mg/dL (ref 7.0–26.0)
CO2: 26 mEq/L (ref 22–29)
Calcium: 9.6 mg/dL (ref 8.4–10.4)
Chloride: 102 mEq/L (ref 98–109)
Creatinine: 0.8 mg/dL (ref 0.6–1.1)
EGFR: 70 mL/min/{1.73_m2} — ABNORMAL LOW (ref >90)
Glucose: 208 mg/dl — ABNORMAL HIGH (ref 70–140)
Potassium: 4.2 mEq/L (ref 3.5–5.1)
Sodium: 138 mEq/L (ref 136–145)
Total Bilirubin: 0.35 mg/dL (ref 0.20–1.20)
Total Protein: 6.8 g/dL (ref 6.4–8.3)

## 2016-06-11 MED ORDER — DIPHENHYDRAMINE HCL 25 MG PO CAPS
ORAL_CAPSULE | ORAL | Status: AC
  Filled 2016-06-11: qty 1

## 2016-06-11 MED ORDER — HEPARIN SOD (PORK) LOCK FLUSH 100 UNIT/ML IV SOLN
500.0000 [IU] | Freq: Once | INTRAVENOUS | Status: AC | PRN
  Administered 2016-06-11: 500 [IU]
  Filled 2016-06-11: qty 5

## 2016-06-11 MED ORDER — ACETAMINOPHEN 325 MG PO TABS
650.0000 mg | ORAL_TABLET | Freq: Once | ORAL | Status: AC
  Administered 2016-06-11: 650 mg via ORAL

## 2016-06-11 MED ORDER — SODIUM CHLORIDE 0.9% FLUSH
10.0000 mL | INTRAVENOUS | Status: DC | PRN
  Administered 2016-06-11: 10 mL
  Filled 2016-06-11: qty 10

## 2016-06-11 MED ORDER — DIPHENHYDRAMINE HCL 25 MG PO CAPS
25.0000 mg | ORAL_CAPSULE | Freq: Once | ORAL | Status: AC
  Administered 2016-06-11: 25 mg via ORAL

## 2016-06-11 MED ORDER — ACETAMINOPHEN 325 MG PO TABS
ORAL_TABLET | ORAL | Status: AC
  Filled 2016-06-11: qty 2

## 2016-06-11 MED ORDER — SODIUM CHLORIDE 0.9 % IV SOLN
Freq: Once | INTRAVENOUS | Status: AC
  Administered 2016-06-11: 11:00:00 via INTRAVENOUS

## 2016-06-11 MED ORDER — TRASTUZUMAB CHEMO 150 MG IV SOLR
6.0000 mg/kg | Freq: Once | INTRAVENOUS | Status: AC
  Administered 2016-06-11: 462 mg via INTRAVENOUS
  Filled 2016-06-11: qty 22

## 2016-06-11 NOTE — Patient Instructions (Signed)
[facility] Discharge Instructions for Patients Receiving Chemotherapy  Today you received the following chemotherapy agents: Herceptin   To help prevent nausea and vomiting after your treatment, we encourage you to take your nausea medication as directed.    If you develop nausea and vomiting that is not controlled by your nausea medication, call the clinic.   BELOW ARE SYMPTOMS THAT SHOULD BE REPORTED IMMEDIATELY:  *FEVER GREATER THAN 100.5 F  *CHILLS WITH OR WITHOUT FEVER  NAUSEA AND VOMITING THAT IS NOT CONTROLLED WITH YOUR NAUSEA MEDICATION  *UNUSUAL SHORTNESS OF BREATH  *UNUSUAL BRUISING OR BLEEDING  TENDERNESS IN MOUTH AND THROAT WITH OR WITHOUT PRESENCE OF ULCERS  *URINARY PROBLEMS  *BOWEL PROBLEMS  UNUSUAL RASH Items with * indicate a potential emergency and should be followed up as soon as possible.  Feel free to call the clinic you have any questions or concerns. The clinic phone number is [phone].  Please show the CHEMO ALERT CARD at check-in to the Emergency Department and triage nurse.   

## 2016-06-12 ENCOUNTER — Ambulatory Visit
Admission: RE | Admit: 2016-06-12 | Discharge: 2016-06-12 | Disposition: A | Discharge status: Home / Self Care | Payer: BLUE CROSS/BLUE SHIELD | Source: Ambulatory Visit | Attending: Radiation Oncology | Admitting: Radiation Oncology

## 2016-06-12 DIAGNOSIS — I10 Essential (primary) hypertension: Secondary | ICD-10-CM | POA: Diagnosis not present

## 2016-06-12 DIAGNOSIS — K219 Gastro-esophageal reflux disease without esophagitis: Secondary | ICD-10-CM | POA: Diagnosis not present

## 2016-06-12 DIAGNOSIS — Z8249 Family history of ischemic heart disease and other diseases of the circulatory system: Secondary | ICD-10-CM | POA: Diagnosis not present

## 2016-06-12 DIAGNOSIS — E119 Type 2 diabetes mellitus without complications: Secondary | ICD-10-CM | POA: Diagnosis not present

## 2016-06-12 DIAGNOSIS — Z170 Estrogen receptor positive status [ER+]: Secondary | ICD-10-CM | POA: Diagnosis not present

## 2016-06-12 DIAGNOSIS — Z90710 Acquired absence of both cervix and uterus: Secondary | ICD-10-CM | POA: Diagnosis not present

## 2016-06-12 DIAGNOSIS — Z833 Family history of diabetes mellitus: Secondary | ICD-10-CM | POA: Diagnosis not present

## 2016-06-12 DIAGNOSIS — Z9889 Other specified postprocedural states: Secondary | ICD-10-CM | POA: Diagnosis not present

## 2016-06-12 DIAGNOSIS — C50412 Malignant neoplasm of upper-outer quadrant of left female breast: Secondary | ICD-10-CM | POA: Diagnosis not present

## 2016-06-12 DIAGNOSIS — K449 Diaphragmatic hernia without obstruction or gangrene: Secondary | ICD-10-CM | POA: Diagnosis not present

## 2016-06-12 DIAGNOSIS — Z7984 Long term (current) use of oral hypoglycemic drugs: Secondary | ICD-10-CM | POA: Diagnosis not present

## 2016-06-12 DIAGNOSIS — Z811 Family history of alcohol abuse and dependence: Secondary | ICD-10-CM | POA: Diagnosis not present

## 2016-06-12 DIAGNOSIS — Z86010 Personal history of colonic polyps: Secondary | ICD-10-CM | POA: Diagnosis not present

## 2016-06-12 DIAGNOSIS — Z7982 Long term (current) use of aspirin: Secondary | ICD-10-CM | POA: Diagnosis not present

## 2016-06-12 DIAGNOSIS — Z510 Encounter for antineoplastic radiation therapy: Secondary | ICD-10-CM | POA: Diagnosis not present

## 2016-06-12 DIAGNOSIS — Z9049 Acquired absence of other specified parts of digestive tract: Secondary | ICD-10-CM | POA: Diagnosis not present

## 2016-06-12 LAB — VITAMIN D 25 HYDROXY (VIT D DEFICIENCY, FRACTURES): Vitamin D, 25-Hydroxy: 23 ng/mL — ABNORMAL LOW (ref 30.0–100.0)

## 2016-06-15 ENCOUNTER — Ambulatory Visit
Admission: RE | Admit: 2016-06-15 | Discharge: 2016-06-15 | Disposition: A | Discharge status: Home / Self Care | Payer: BLUE CROSS/BLUE SHIELD | Source: Ambulatory Visit | Attending: Radiation Oncology | Admitting: Radiation Oncology

## 2016-06-15 DIAGNOSIS — Z170 Estrogen receptor positive status [ER+]: Secondary | ICD-10-CM | POA: Diagnosis not present

## 2016-06-15 DIAGNOSIS — Z90710 Acquired absence of both cervix and uterus: Secondary | ICD-10-CM | POA: Diagnosis not present

## 2016-06-15 DIAGNOSIS — K219 Gastro-esophageal reflux disease without esophagitis: Secondary | ICD-10-CM | POA: Diagnosis not present

## 2016-06-15 DIAGNOSIS — I10 Essential (primary) hypertension: Secondary | ICD-10-CM | POA: Diagnosis not present

## 2016-06-15 DIAGNOSIS — Z9049 Acquired absence of other specified parts of digestive tract: Secondary | ICD-10-CM | POA: Diagnosis not present

## 2016-06-15 DIAGNOSIS — E119 Type 2 diabetes mellitus without complications: Secondary | ICD-10-CM | POA: Diagnosis not present

## 2016-06-15 DIAGNOSIS — C50412 Malignant neoplasm of upper-outer quadrant of left female breast: Secondary | ICD-10-CM | POA: Diagnosis not present

## 2016-06-15 DIAGNOSIS — Z7984 Long term (current) use of oral hypoglycemic drugs: Secondary | ICD-10-CM | POA: Diagnosis not present

## 2016-06-15 DIAGNOSIS — Z8249 Family history of ischemic heart disease and other diseases of the circulatory system: Secondary | ICD-10-CM | POA: Diagnosis not present

## 2016-06-15 DIAGNOSIS — K449 Diaphragmatic hernia without obstruction or gangrene: Secondary | ICD-10-CM | POA: Diagnosis not present

## 2016-06-15 DIAGNOSIS — Z7982 Long term (current) use of aspirin: Secondary | ICD-10-CM | POA: Diagnosis not present

## 2016-06-15 DIAGNOSIS — Z811 Family history of alcohol abuse and dependence: Secondary | ICD-10-CM | POA: Diagnosis not present

## 2016-06-15 DIAGNOSIS — Z86010 Personal history of colonic polyps: Secondary | ICD-10-CM | POA: Diagnosis not present

## 2016-06-15 DIAGNOSIS — Z9889 Other specified postprocedural states: Secondary | ICD-10-CM | POA: Diagnosis not present

## 2016-06-15 DIAGNOSIS — Z833 Family history of diabetes mellitus: Secondary | ICD-10-CM | POA: Diagnosis not present

## 2016-06-15 DIAGNOSIS — Z510 Encounter for antineoplastic radiation therapy: Secondary | ICD-10-CM | POA: Diagnosis not present

## 2016-06-16 ENCOUNTER — Ambulatory Visit
Admission: RE | Admit: 2016-06-16 | Discharge: 2016-06-16 | Disposition: A | Discharge status: Home / Self Care | Payer: BLUE CROSS/BLUE SHIELD | Source: Ambulatory Visit | Attending: Radiation Oncology | Admitting: Radiation Oncology

## 2016-06-16 DIAGNOSIS — K449 Diaphragmatic hernia without obstruction or gangrene: Secondary | ICD-10-CM | POA: Diagnosis not present

## 2016-06-16 DIAGNOSIS — Z8249 Family history of ischemic heart disease and other diseases of the circulatory system: Secondary | ICD-10-CM | POA: Diagnosis not present

## 2016-06-16 DIAGNOSIS — Z9049 Acquired absence of other specified parts of digestive tract: Secondary | ICD-10-CM | POA: Diagnosis not present

## 2016-06-16 DIAGNOSIS — Z811 Family history of alcohol abuse and dependence: Secondary | ICD-10-CM | POA: Diagnosis not present

## 2016-06-16 DIAGNOSIS — Z833 Family history of diabetes mellitus: Secondary | ICD-10-CM | POA: Diagnosis not present

## 2016-06-16 DIAGNOSIS — E119 Type 2 diabetes mellitus without complications: Secondary | ICD-10-CM | POA: Diagnosis not present

## 2016-06-16 DIAGNOSIS — Z7984 Long term (current) use of oral hypoglycemic drugs: Secondary | ICD-10-CM | POA: Diagnosis not present

## 2016-06-16 DIAGNOSIS — C50412 Malignant neoplasm of upper-outer quadrant of left female breast: Secondary | ICD-10-CM | POA: Diagnosis not present

## 2016-06-16 DIAGNOSIS — Z86010 Personal history of colonic polyps: Secondary | ICD-10-CM | POA: Diagnosis not present

## 2016-06-16 DIAGNOSIS — Z9889 Other specified postprocedural states: Secondary | ICD-10-CM | POA: Diagnosis not present

## 2016-06-16 DIAGNOSIS — Z90710 Acquired absence of both cervix and uterus: Secondary | ICD-10-CM | POA: Diagnosis not present

## 2016-06-16 DIAGNOSIS — Z510 Encounter for antineoplastic radiation therapy: Secondary | ICD-10-CM | POA: Diagnosis not present

## 2016-06-16 DIAGNOSIS — Z170 Estrogen receptor positive status [ER+]: Secondary | ICD-10-CM | POA: Diagnosis not present

## 2016-06-16 DIAGNOSIS — K219 Gastro-esophageal reflux disease without esophagitis: Secondary | ICD-10-CM | POA: Diagnosis not present

## 2016-06-16 DIAGNOSIS — Z7982 Long term (current) use of aspirin: Secondary | ICD-10-CM | POA: Diagnosis not present

## 2016-06-16 DIAGNOSIS — I10 Essential (primary) hypertension: Secondary | ICD-10-CM | POA: Diagnosis not present

## 2016-06-17 ENCOUNTER — Ambulatory Visit
Admission: RE | Admit: 2016-06-17 | Discharge: 2016-06-17 | Disposition: A | Discharge status: Home / Self Care | Payer: BLUE CROSS/BLUE SHIELD | Source: Ambulatory Visit | Attending: Radiation Oncology | Admitting: Radiation Oncology

## 2016-06-17 ENCOUNTER — Telehealth: Payer: Self-pay | Admitting: Oncology

## 2016-06-17 DIAGNOSIS — I10 Essential (primary) hypertension: Secondary | ICD-10-CM | POA: Diagnosis not present

## 2016-06-17 DIAGNOSIS — Z9049 Acquired absence of other specified parts of digestive tract: Secondary | ICD-10-CM | POA: Diagnosis not present

## 2016-06-17 DIAGNOSIS — Z811 Family history of alcohol abuse and dependence: Secondary | ICD-10-CM | POA: Diagnosis not present

## 2016-06-17 DIAGNOSIS — K219 Gastro-esophageal reflux disease without esophagitis: Secondary | ICD-10-CM | POA: Diagnosis not present

## 2016-06-17 DIAGNOSIS — Z510 Encounter for antineoplastic radiation therapy: Secondary | ICD-10-CM | POA: Diagnosis not present

## 2016-06-17 DIAGNOSIS — C50412 Malignant neoplasm of upper-outer quadrant of left female breast: Secondary | ICD-10-CM | POA: Diagnosis not present

## 2016-06-17 DIAGNOSIS — Z9889 Other specified postprocedural states: Secondary | ICD-10-CM | POA: Diagnosis not present

## 2016-06-17 DIAGNOSIS — Z7982 Long term (current) use of aspirin: Secondary | ICD-10-CM | POA: Diagnosis not present

## 2016-06-17 DIAGNOSIS — E119 Type 2 diabetes mellitus without complications: Secondary | ICD-10-CM | POA: Diagnosis not present

## 2016-06-17 DIAGNOSIS — Z90710 Acquired absence of both cervix and uterus: Secondary | ICD-10-CM | POA: Diagnosis not present

## 2016-06-17 DIAGNOSIS — Z86010 Personal history of colonic polyps: Secondary | ICD-10-CM | POA: Diagnosis not present

## 2016-06-17 DIAGNOSIS — Z8249 Family history of ischemic heart disease and other diseases of the circulatory system: Secondary | ICD-10-CM | POA: Diagnosis not present

## 2016-06-17 DIAGNOSIS — Z7984 Long term (current) use of oral hypoglycemic drugs: Secondary | ICD-10-CM | POA: Diagnosis not present

## 2016-06-17 DIAGNOSIS — Z833 Family history of diabetes mellitus: Secondary | ICD-10-CM | POA: Diagnosis not present

## 2016-06-17 DIAGNOSIS — Z170 Estrogen receptor positive status [ER+]: Secondary | ICD-10-CM | POA: Diagnosis not present

## 2016-06-17 DIAGNOSIS — K449 Diaphragmatic hernia without obstruction or gangrene: Secondary | ICD-10-CM | POA: Diagnosis not present

## 2016-06-17 NOTE — Telephone Encounter (Signed)
Left message for patient re 4/18 bone density and 4/29 appointments at [facility]. Schedule mailed.

## 2016-06-18 ENCOUNTER — Ambulatory Visit
Admission: RE | Admit: 2016-06-18 | Discharge: 2016-06-18 | Disposition: A | Discharge status: Home / Self Care | Payer: BLUE CROSS/BLUE SHIELD | Source: Ambulatory Visit | Attending: Radiation Oncology | Admitting: Radiation Oncology

## 2016-06-18 DIAGNOSIS — Z510 Encounter for antineoplastic radiation therapy: Secondary | ICD-10-CM | POA: Diagnosis not present

## 2016-06-18 DIAGNOSIS — Z170 Estrogen receptor positive status [ER+]: Secondary | ICD-10-CM | POA: Insufficient documentation

## 2016-06-18 DIAGNOSIS — C50412 Malignant neoplasm of upper-outer quadrant of left female breast: Secondary | ICD-10-CM | POA: Insufficient documentation

## 2016-06-19 ENCOUNTER — Ambulatory Visit
Admission: RE | Admit: 2016-06-19 | Discharge: 2016-06-19 | Disposition: A | Discharge status: Home / Self Care | Payer: BLUE CROSS/BLUE SHIELD | Source: Ambulatory Visit | Attending: Radiation Oncology | Admitting: Radiation Oncology

## 2016-06-19 DIAGNOSIS — Z170 Estrogen receptor positive status [ER+]: Secondary | ICD-10-CM | POA: Diagnosis not present

## 2016-06-19 DIAGNOSIS — Z510 Encounter for antineoplastic radiation therapy: Secondary | ICD-10-CM | POA: Diagnosis not present

## 2016-06-19 DIAGNOSIS — C50412 Malignant neoplasm of upper-outer quadrant of left female breast: Secondary | ICD-10-CM | POA: Diagnosis not present

## 2016-06-22 ENCOUNTER — Ambulatory Visit
Admission: RE | Admit: 2016-06-22 | Discharge: 2016-06-22 | Disposition: A | Discharge status: Home / Self Care | Payer: BLUE CROSS/BLUE SHIELD | Source: Ambulatory Visit | Attending: Radiation Oncology | Admitting: Radiation Oncology

## 2016-06-22 DIAGNOSIS — Z510 Encounter for antineoplastic radiation therapy: Secondary | ICD-10-CM | POA: Diagnosis not present

## 2016-06-22 DIAGNOSIS — C50412 Malignant neoplasm of upper-outer quadrant of left female breast: Secondary | ICD-10-CM | POA: Diagnosis not present

## 2016-06-22 DIAGNOSIS — Z170 Estrogen receptor positive status [ER+]: Secondary | ICD-10-CM

## 2016-06-22 MED ORDER — RADIAPLEXRX EX GEL
Freq: Once | CUTANEOUS | Status: AC
  Administered 2016-06-22: 17:00:00 via TOPICAL

## 2016-06-23 ENCOUNTER — Ambulatory Visit
Admission: RE | Admit: 2016-06-23 | Discharge: 2016-06-23 | Disposition: A | Discharge status: Home / Self Care | Payer: BLUE CROSS/BLUE SHIELD | Source: Ambulatory Visit | Attending: Radiation Oncology | Admitting: Radiation Oncology

## 2016-06-23 DIAGNOSIS — C50412 Malignant neoplasm of upper-outer quadrant of left female breast: Secondary | ICD-10-CM | POA: Diagnosis not present

## 2016-06-23 DIAGNOSIS — Z510 Encounter for antineoplastic radiation therapy: Secondary | ICD-10-CM | POA: Diagnosis not present

## 2016-06-23 DIAGNOSIS — Z170 Estrogen receptor positive status [ER+]: Secondary | ICD-10-CM | POA: Diagnosis not present

## 2016-06-24 ENCOUNTER — Ambulatory Visit
Admission: RE | Admit: 2016-06-24 | Discharge: 2016-06-24 | Disposition: A | Discharge status: Home / Self Care | Payer: BLUE CROSS/BLUE SHIELD | Source: Ambulatory Visit | Attending: Oncology | Admitting: Oncology

## 2016-06-24 ENCOUNTER — Ambulatory Visit
Admission: RE | Admit: 2016-06-24 | Discharge: 2016-06-24 | Disposition: A | Discharge status: Home / Self Care | Payer: BLUE CROSS/BLUE SHIELD | Source: Ambulatory Visit | Attending: Radiation Oncology | Admitting: Radiation Oncology

## 2016-06-24 DIAGNOSIS — C50412 Malignant neoplasm of upper-outer quadrant of left female breast: Secondary | ICD-10-CM | POA: Diagnosis not present

## 2016-06-24 DIAGNOSIS — Z13820 Encounter for screening for osteoporosis: Secondary | ICD-10-CM | POA: Diagnosis not present

## 2016-06-24 DIAGNOSIS — Z780 Asymptomatic menopausal state: Secondary | ICD-10-CM | POA: Diagnosis not present

## 2016-06-24 DIAGNOSIS — Z170 Estrogen receptor positive status [ER+]: Secondary | ICD-10-CM | POA: Diagnosis not present

## 2016-06-24 DIAGNOSIS — Z510 Encounter for antineoplastic radiation therapy: Secondary | ICD-10-CM | POA: Diagnosis not present

## 2016-06-24 DIAGNOSIS — M8580 Other specified disorders of bone density and structure, unspecified site: Secondary | ICD-10-CM

## 2016-06-25 ENCOUNTER — Ambulatory Visit
Admission: RE | Admit: 2016-06-25 | Discharge: 2016-06-25 | Disposition: A | Discharge status: Home / Self Care | Payer: BLUE CROSS/BLUE SHIELD | Source: Ambulatory Visit | Attending: Radiation Oncology | Admitting: Radiation Oncology

## 2016-06-25 DIAGNOSIS — C50412 Malignant neoplasm of upper-outer quadrant of left female breast: Secondary | ICD-10-CM | POA: Diagnosis not present

## 2016-06-25 DIAGNOSIS — Z510 Encounter for antineoplastic radiation therapy: Secondary | ICD-10-CM | POA: Diagnosis not present

## 2016-06-25 DIAGNOSIS — Z170 Estrogen receptor positive status [ER+]: Secondary | ICD-10-CM | POA: Diagnosis not present

## 2016-06-26 ENCOUNTER — Ambulatory Visit
Admission: RE | Admit: 2016-06-26 | Discharge: 2016-06-26 | Disposition: A | Discharge status: Home / Self Care | Payer: BLUE CROSS/BLUE SHIELD | Source: Ambulatory Visit | Attending: Radiation Oncology | Admitting: Radiation Oncology

## 2016-06-26 DIAGNOSIS — Z170 Estrogen receptor positive status [ER+]: Secondary | ICD-10-CM | POA: Diagnosis not present

## 2016-06-26 DIAGNOSIS — C50412 Malignant neoplasm of upper-outer quadrant of left female breast: Secondary | ICD-10-CM | POA: Diagnosis not present

## 2016-06-26 DIAGNOSIS — Z510 Encounter for antineoplastic radiation therapy: Secondary | ICD-10-CM | POA: Diagnosis not present

## 2016-06-29 ENCOUNTER — Ambulatory Visit
Admission: RE | Admit: 2016-06-29 | Discharge: 2016-06-29 | Disposition: A | Discharge status: Home / Self Care | Payer: BLUE CROSS/BLUE SHIELD | Source: Ambulatory Visit | Attending: Radiation Oncology | Admitting: Radiation Oncology

## 2016-06-29 ENCOUNTER — Other Ambulatory Visit: Payer: Self-pay | Admitting: Radiation Oncology

## 2016-06-29 ENCOUNTER — Encounter: Payer: Self-pay | Admitting: Radiation Oncology

## 2016-06-29 DIAGNOSIS — Z170 Estrogen receptor positive status [ER+]: Secondary | ICD-10-CM | POA: Diagnosis not present

## 2016-06-29 DIAGNOSIS — Z510 Encounter for antineoplastic radiation therapy: Secondary | ICD-10-CM | POA: Diagnosis not present

## 2016-06-29 DIAGNOSIS — C50412 Malignant neoplasm of upper-outer quadrant of left female breast: Secondary | ICD-10-CM | POA: Diagnosis not present

## 2016-06-30 ENCOUNTER — Telehealth: Payer: Self-pay | Admitting: *Deleted

## 2016-06-30 NOTE — Progress Notes (Signed)
  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 Towards the end of treatment, the patient developed erythema over the left breast with peeling in the IM fold. She was advised to apply Neosporin to the areas of peeling. Her left breast was sore and she had fatigue.  Plan: The patient has completed radiation treatment. The patient will return to radiation oncology clinic for routine followup in one month. I advised them to call or return sooner if they have any questions or concerns related to their recovery or treatment.  -----------------------------------  Eppie Gibson, MD  This document serves as a record of services personally performed by Eppie Gibson, MD. It was created on her behalf by Darcus Austin, a trained medical scribe. The creation of this record is based on the scribe's personal observations and the provider's statements to them. This document has been checked and approved by the attending provider.

## 2016-06-30 NOTE — Telephone Encounter (Signed)
CALLED PATIENT TO INFORM OF FU APPT. FOR 07-31-16 @ 10:20 AM WITH DR. Isidore Moos, LVM FOR A RETURN CALL

## 2016-07-02 ENCOUNTER — Encounter: Payer: Self-pay | Admitting: Pharmacist

## 2016-07-02 ENCOUNTER — Other Ambulatory Visit (HOSPITAL_BASED_OUTPATIENT_CLINIC_OR_DEPARTMENT_OTHER): Payer: BLUE CROSS/BLUE SHIELD

## 2016-07-02 ENCOUNTER — Ambulatory Visit (HOSPITAL_BASED_OUTPATIENT_CLINIC_OR_DEPARTMENT_OTHER): Payer: BLUE CROSS/BLUE SHIELD

## 2016-07-02 ENCOUNTER — Ambulatory Visit (HOSPITAL_BASED_OUTPATIENT_CLINIC_OR_DEPARTMENT_OTHER): Payer: BLUE CROSS/BLUE SHIELD | Admitting: Oncology

## 2016-07-02 VITALS — BP 139/78 | HR 77 | Temp 97.7°F | Resp 18 | Ht 64.5 in | Wt 168.1 lb

## 2016-07-02 DIAGNOSIS — Z170 Estrogen receptor positive status [ER+]: Secondary | ICD-10-CM

## 2016-07-02 DIAGNOSIS — M79671 Pain in right foot: Secondary | ICD-10-CM

## 2016-07-02 DIAGNOSIS — C50412 Malignant neoplasm of upper-outer quadrant of left female breast: Secondary | ICD-10-CM | POA: Diagnosis not present

## 2016-07-02 DIAGNOSIS — C773 Secondary and unspecified malignant neoplasm of axilla and upper limb lymph nodes: Secondary | ICD-10-CM | POA: Diagnosis not present

## 2016-07-02 DIAGNOSIS — R234 Changes in skin texture: Secondary | ICD-10-CM | POA: Diagnosis not present

## 2016-07-02 DIAGNOSIS — Z5112 Encounter for antineoplastic immunotherapy: Secondary | ICD-10-CM | POA: Diagnosis not present

## 2016-07-02 DIAGNOSIS — Z79811 Long term (current) use of aromatase inhibitors: Secondary | ICD-10-CM

## 2016-07-02 LAB — CBC WITH DIFFERENTIAL/PLATELET
BASO%: 0.4 % (ref 0.0–2.0)
Basophils Absolute: 0 10*3/uL (ref 0.0–0.1)
EOS%: 4.2 % (ref 0.0–7.0)
Eosinophils Absolute: 0.2 10*3/uL (ref 0.0–0.5)
HCT: 34.7 % — ABNORMAL LOW (ref 34.8–46.6)
HGB: 10.9 g/dL — ABNORMAL LOW (ref 11.6–15.9)
LYMPH%: 14.7 % (ref 14.0–49.7)
MCH: 25.8 pg (ref 25.1–34.0)
MCHC: 31.4 g/dL — ABNORMAL LOW (ref 31.5–36.0)
MCV: 82 fL (ref 79.5–101.0)
MONO#: 0.4 10*3/uL (ref 0.1–0.9)
MONO%: 7.8 % (ref 0.0–14.0)
NEUT#: 3.5 10*3/uL (ref 1.5–6.5)
NEUT%: 72.9 % (ref 38.4–76.8)
Platelets: 187 10*3/uL (ref 145–400)
RBC: 4.23 10*6/uL (ref 3.70–5.45)
RDW: 15.9 % — ABNORMAL HIGH (ref 11.2–14.5)
WBC: 4.8 10*3/uL (ref 3.9–10.3)
lymph#: 0.7 10*3/uL — ABNORMAL LOW (ref 0.9–3.3)

## 2016-07-02 LAB — COMPREHENSIVE METABOLIC PANEL
ALT: 31 U/L (ref 0–55)
AST: 24 U/L (ref 5–34)
Albumin: 3.7 g/dL (ref 3.5–5.0)
Alkaline Phosphatase: 157 U/L — ABNORMAL HIGH (ref 40–150)
Anion Gap: 12 mEq/L — ABNORMAL HIGH (ref 3–11)
BUN: 10.8 mg/dL (ref 7.0–26.0)
CO2: 27 mEq/L (ref 22–29)
Calcium: 9.6 mg/dL (ref 8.4–10.4)
Chloride: 101 mEq/L (ref 98–109)
Creatinine: 0.9 mg/dL (ref 0.6–1.1)
EGFR: 66 mL/min/{1.73_m2} — ABNORMAL LOW (ref >90)
Glucose: 201 mg/dl — ABNORMAL HIGH (ref 70–140)
Potassium: 3.7 mEq/L (ref 3.5–5.1)
Sodium: 140 mEq/L (ref 136–145)
Total Bilirubin: 0.37 mg/dL (ref 0.20–1.20)
Total Protein: 7 g/dL (ref 6.4–8.3)

## 2016-07-02 MED ORDER — SODIUM CHLORIDE 0.9 % IV SOLN
6.0000 mg/kg | Freq: Once | INTRAVENOUS | Status: AC
  Administered 2016-07-02: 462 mg via INTRAVENOUS
  Filled 2016-07-02: qty 22

## 2016-07-02 MED ORDER — ACETAMINOPHEN 325 MG PO TABS
650.0000 mg | ORAL_TABLET | Freq: Once | ORAL | Status: AC
  Administered 2016-07-02: 650 mg via ORAL

## 2016-07-02 MED ORDER — HEPARIN SOD (PORK) LOCK FLUSH 100 UNIT/ML IV SOLN
500.0000 [IU] | Freq: Once | INTRAVENOUS | Status: AC | PRN
  Administered 2016-07-02: 500 [IU]
  Filled 2016-07-02: qty 5

## 2016-07-02 MED ORDER — ANASTROZOLE 1 MG PO TABS: 1.0000 mg | ORAL_TABLET | Freq: Every day | ORAL | 4 refills | Status: DC

## 2016-07-02 MED ORDER — SODIUM CHLORIDE 0.9 % IV SOLN
Freq: Once | INTRAVENOUS | Status: AC
  Administered 2016-07-02: 14:00:00 via INTRAVENOUS

## 2016-07-02 MED ORDER — DIPHENHYDRAMINE HCL 25 MG PO CAPS
ORAL_CAPSULE | ORAL | Status: AC
  Filled 2016-07-02: qty 1

## 2016-07-02 MED ORDER — ACETAMINOPHEN 325 MG PO TABS
ORAL_TABLET | ORAL | Status: AC
  Filled 2016-07-02: qty 2

## 2016-07-02 MED ORDER — DIPHENHYDRAMINE HCL 25 MG PO CAPS
25.0000 mg | ORAL_CAPSULE | Freq: Once | ORAL | Status: AC
  Administered 2016-07-02: 25 mg via ORAL

## 2016-07-02 MED ORDER — SODIUM CHLORIDE 0.9% FLUSH
10.0000 mL | INTRAVENOUS | Status: DC | PRN
  Administered 2016-07-02: 10 mL
  Filled 2016-07-02: qty 10

## 2016-07-02 NOTE — Progress Notes (Signed)
Consent documentation  Study code: [id number]  Met with patient during the patient's provider visit on 07/02/16 at 12:15. Patient was alone for this visit. Provided an overview of the "Pharmacogenetic analysis of toxicities related to administration of taxanes in breast cancer patients" study.  Consent form was reviewed with the patient (reviewed the study purpose, patient's role, possible side effects, cost, risk, information protection) All of the patient's questions were answered and she agreed to participate in the study. A signed copy of the consent form was given to the patient. All eligibility criteria have been met and patient has been enrolled in the study.  Study sample was collected via buccal swab after consent was obtained. Patient was informed that the sample would be sent to the lab for processing after samples were collected from 25 patients and that it would take approximately 2 weeks for the lab to process that sample.   Met with the patient for approximately 15 minutes.  Darl Pikes, PharmD, [place] Clinical Pharmacist- Oncology Pharmacy Resident

## 2016-07-02 NOTE — Patient Instructions (Signed)
[facility] Discharge Instructions for Patients Receiving Chemotherapy  Today you received the following chemotherapy agents: Herceptin   To help prevent nausea and vomiting after your treatment, we encourage you to take your nausea medication as directed.    If you develop nausea and vomiting that is not controlled by your nausea medication, call the clinic.   BELOW ARE SYMPTOMS THAT SHOULD BE REPORTED IMMEDIATELY:  *FEVER GREATER THAN 100.5 F  *CHILLS WITH OR WITHOUT FEVER  NAUSEA AND VOMITING THAT IS NOT CONTROLLED WITH YOUR NAUSEA MEDICATION  *UNUSUAL SHORTNESS OF BREATH  *UNUSUAL BRUISING OR BLEEDING  TENDERNESS IN MOUTH AND THROAT WITH OR WITHOUT PRESENCE OF ULCERS  *URINARY PROBLEMS  *BOWEL PROBLEMS  UNUSUAL RASH Items with * indicate a potential emergency and should be followed up as soon as possible.  Feel free to call the clinic you have any questions or concerns. The clinic phone number is [phone].  Please show the CHEMO ALERT CARD at check-in to the Emergency Department and triage nurse.   

## 2016-07-02 NOTE — Progress Notes (Signed)
[facility]  [phone] [phone]     ID: ELEANER DIBARTOLO DOB: November 03, 1945  MR#: [id number]  DXI#:[id number]  Patient Care Team: Orpah Melter, MD as PCP - General (Family Medicine) Autumn Messing III, MD as Consulting Physician (General Surgery) Chauncey Cruel, MD as Consulting Physician (Oncology) Eppie Gibson, MD as Attending Physician (Radiation Oncology) Murvin Donning, MD (Dermatology) Laurence Spates, MD as Consulting Physician (Gastroenterology) Rosemary Holms, DPM as Consulting Physician (Podiatry) Viona Gilmore Evette Cristal, MD as Consulting Physician (Obstetrics and Gynecology) Larey Dresser, MD as Consulting Physician (Cardiology) PCP: Orpah Melter, MD OTHER MD:  CHIEF COMPLAINT: Estrogen receptor positive breast cancer  CURRENT TREATMENT: anti-HER-2 immunotherapy; anastrozole   BREAST CANCER HISTORY: From the original intake note  Deirdre herself noted a change in her left breast and brought it to the attention of Dr. Nori Riis, who set her up for bilateral diagnostic mammography with tomography and left breast ultrasonography at the [facility] 08/09/2015. The breast density was category D. In the area of palpable concern there was a mass with irregular margins and architectural distortion, measuring approximately 3 cm. This was palpable in the upper outer quadrant near the nipple. The ultrasound confirmed an irregular hypoechoic mass approximately 3 cm from the nipple at the 10:00 position measuring 2.7 cm. Ultrasound of the left axilla found a single level I left axillary node with focal cortical thickening.  On 08/13/2015 the patient underwent biopsy of the left breast mass and the suspicious left axillary lymph node, both showing invasive ductal carcinoma, grade 2 or 3, estrogen receptor 100% positive, progesterone receptor 100% positive, both with strong staining intensity, with an MIB-1 of 50%, and HER-2 amplification, the signals ratio being 2.2  to and the number per cell 3.55  The patient's subsequent history is as detailed below  INTERVAL HISTORY: [name] returns today for follow-up of her triple positive breast cancer. Since her last visit here she completed her radiation treatments, last dose 06/29/2016. She generally did well with dose, although she did feel tired and she did have some erythema and mild desquamation from the treatment.  She continues on trastuzumab every 21 days, with good tolerance. Her most recent echocardiogram was mid February, showing a stable ejection fraction.  REVIEW OF SYSTEMS: Ravneet doesn't she is not quite ready to show her hair (she is wearing a wig today) because it is "wild". She is going to have a trimmed. She is planning to vacation in [place] gaining the second half of May. She tells me her sugars are better controlled. She has a good appetite. She tells me her diet is now "better". She is still not exercising regularly. A detailed review of systems today was otherwise stable  PAST MEDICAL HISTORY: Past Medical History:  Diagnosis Date  . Arthritis    "hands "  . Breast cancer of upper-outer quadrant of left female breast ([place]) 08/15/2015  . Colon polyps   . Dyspnea    with exertion- "from chemo"  . GERD (gastroesophageal reflux disease)   . Hiatal hernia   . History of bronchitis   . Hypertension   . Neuropathy ([place])    from chemopathy  . Peptic ulcer disease   . Peripheral neuropathy ([place])   . Type 2 diabetes mellitus ([place])    Type II    PAST SURGICAL HISTORY: Past Surgical History:  Procedure Laterality Date  . APPENDECTOMY  1974  . cataract surgery Bilateral 04/2012  . CHOLECYSTECTOMY  1996  . COLONOSCOPY    .  FOOT SURGERY     left bunion removed  . nerve removed from left foot  2000  . PORTACATH PLACEMENT N/A 09/05/2015   Procedure: INSERTION PORT-A-CATH;  Surgeon: Autumn Messing III, MD;  Location: [facility];  Service: General;  Laterality: N/A;  . RADIOACTIVE SEED GUIDED PARTIAL  MASTECTOMY/AXILLARY SENTINEL NODE BIOPSY/AXILLARY NODE DISSECTION Left 02/14/2016   Procedure: LEFT BREAST RADIOACTIVE SEED GUIDED LUMPECTOMY WITH LEFT  SENTINEL LYMPH NODE BIOPSY AND LEFT  SEED TARGETED DISSECTION;  Surgeon: Autumn Messing III, MD;  Location: [facility];  Service: General;  Laterality: Left;  . TUBAL LIGATION    . VAGINAL HYSTERECTOMY  1995    FAMILY HISTORY Family History  Problem Relation Age of Onset  . Diabetes Mother   . Hypertension Mother   . Alcoholism Maternal Grandfather   . Congestive Heart Failure Maternal Grandmother   The patient has little information about her father and is not sure of his cause of death or age at death. The patient's mother died at age 95. The patient had no siblings. She was raised by her grandmother. She is not aware of any breast or ovarian cancer history in the family   GYNECOLOGIC HISTORY:  No LMP recorded. Patient has had a hysterectomy.  menarche age 23, first live birth age 47. The patient is GX P2. She underwent hysterectomy with bilateral salpingo-oophorectomy in 1994, and has been on estrogen replacement since that time, discontinued June 2017.   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 She is divorced and lives alone, with 2 cats. Daughter [name] lives in [place] and works as a Teacher, music for the  [employer]. Son Lachlan Pelto lives in [place] where he is Secondary school teacher. The patient had one grandchild. She attends [employer]. The patient's daughter [name] is her healthcare power of attorney.     HEALTH MAINTENANCE: Social History  Substance Use Topics  . Smoking status: Never Smoker  . Smokeless tobacco: Never Used  . Alcohol use No     Colonoscopy: 2012/[facility]  PAP:  Bone density: 2015/"normal" at Dr. Verlon Au  Lipid panel:  Allergies  Allergen Reactions  . No Known Allergies     Current  Outpatient Prescriptions  Medication Sig Dispense Refill  . aspirin 81 MG tablet Take 81 mg by mouth daily.    Marland Kitchen doxycycline (VIBRAMYCIN) 100 MG capsule Take 100 mg by mouth 2 (two) times daily.    Marland Kitchen gabapentin (NEURONTIN) 800 MG tablet Take 800 mg by mouth 3 (three) times daily.    . hydrochlorothiazide (MICROZIDE) 12.5 MG capsule Take 12.5 mg by mouth daily.    Marland Kitchen lovastatin (MEVACOR) 20 MG tablet Take 20 mg by mouth at bedtime.    . metFORMIN (GLUCOPHAGE) 500 MG tablet Take 500 mg by mouth 2 (two) times daily with a meal.     . Multiple Vitamin (MULTIVITAMIN) tablet Take 1 tablet by mouth daily.    Marland Kitchen omeprazole (PRILOSEC) 20 MG capsule Take 20 mg by mouth daily.    . potassium chloride (MICRO-K) 10 MEQ CR capsule TAKE 2 CAPSULES (20 MEQ TOTAL) BY MOUTH DAILY. 60 capsule 0  . Probiotic Product (PROBIOTIC DAILY PO) Take 1 tablet by mouth daily. Reported on 08/21/2015    . spironolactone (ALDACTONE) 25 MG tablet Take 0.5 tablets (12.5 mg total) by mouth daily. 45 tablet 3  . traMADol (ULTRAM) 50 MG tablet Take  1 tablet (50 mg total) by mouth every 6 (six) hours as needed. 60 tablet 3   No current facility-administered medications for this visit.     OBJECTIVE: Middle-aged white womanWho appears stated age  27:   07/02/16 1237  BP: 139/78  Pulse: 77  Resp: 18  Temp: 97.7 F (36.5 C)     Body mass index is 28.41 kg/m.    ECOG FS:1 - Symptomatic but completely ambulatory  Sclerae unicteric, EOMs intact Oropharynx clear and moist No cervical or supraclavicular adenopathy Lungs no rales or rhonchi Heart regular rate and rhythm Abd soft, nontender, positive bowel sounds MSK no focal spinal tenderness, no upper extremity lymphedema Neuro: nonfocal, well oriented, appropriate affect Breasts: The right breast is unremarkable. The left breast has undergone lumpectomy and has completed radiation. There is erythema but very minimal dry desquamation in the inframammary fold. Both axillae  are benign   LAB RESULTS:  CMP     Component Value Date/Time   NA 138 06/11/2016 0955   K 4.2 06/11/2016 0955   CL 102 09/03/2015 1542   CO2 26 06/11/2016 0955   GLUCOSE 208 (H) 06/11/2016 0955   BUN 11.6 06/11/2016 0955   CREATININE 0.8 06/11/2016 0955   CALCIUM 9.6 06/11/2016 0955   PROT 6.8 06/11/2016 0955   ALBUMIN 3.6 06/11/2016 0955   AST 25 06/11/2016 0955   ALT 30 06/11/2016 0955   ALKPHOS 164 (H) 06/11/2016 0955   BILITOT 0.35 06/11/2016 0955   GFRNONAA >60 09/03/2015 1542   GFRAA >60 09/03/2015 1542    INo results found for: SPEP, UPEP  Lab Results  Component Value Date   WBC 4.8 07/02/2016   NEUTROABS 3.5 07/02/2016   HGB 10.9 (L) 07/02/2016   HCT 34.7 (L) 07/02/2016   MCV 82.0 07/02/2016   PLT 187 07/02/2016      Chemistry      Component Value Date/Time   NA 138 06/11/2016 0955   K 4.2 06/11/2016 0955   CL 102 09/03/2015 1542   CO2 26 06/11/2016 0955   BUN 11.6 06/11/2016 0955   CREATININE 0.8 06/11/2016 0955      Component Value Date/Time   CALCIUM 9.6 06/11/2016 0955   ALKPHOS 164 (H) 06/11/2016 0955   AST 25 06/11/2016 0955   ALT 30 06/11/2016 0955   BILITOT 0.35 06/11/2016 0955       No results found for: LABCA2  No components found for: ASTMH962  No results for input(s): INR in the last 168 hours.  Urinalysis No results found for: COLORURINE, APPEARANCEUR, LABSPEC, PHURINE, GLUCOSEU, HGBUR, BILIRUBINUR, KETONESUR, PROTEINUR, UROBILINOGEN, NITRITE, LEUKOCYTESUR  STUDIES: Dg Bone Density  Result Date: 06/24/2016 EXAM: DUAL X-RAY ABSORPTIOMETRY (DXA) FOR BONE MINERAL DENSITY IMPRESSION: Referring Physician:  Chauncey Cruel PATIENT: Name: Dorri, Ozturk Patient ID: [id number] Birth Date: 11/03/1945 Height: 64.5 in. Sex: Female Measured: 06/24/2016 Weight: 166.6 lbs. Indications: Advanced Age, Bilateral Ovariectomy (65.51), Breast Cancer History, Caucasian, Estrogen Deficient, Gabapentin, Hysterectomy, Postmenopausal, Prilosec  Fractures: Foot Treatments: None ASSESSMENT: The BMD measured at Femur Neck Left is 0.936 g/cm2 with a T-score of -0.7. This patient is considered normal according to [facility]) criteria. Site Region Measured Date Measured Age YA BMD Significant CHANGE T-score DualFemur Neck Left 06[age]24/2016    71.7         -0.7    0.936 g/cm2 AP Spine  L1-L4     5/24/2016    71.7         2.3  1.475 g/cm2 World Health Organization [facility]) criteria for post-menopausal, Caucasian Women: Normal       T-score at or above -1 SD Osteopenia   T-score between -1 and -2.5 SD Osteoporosis T-score at or below -2.5 SD RECOMMENDATION: [place] recommends that FDA-approved medical therapies be considered in postmenopausal women and men age 77 or older with a: 1. Hip or vertebral (clinical or morphometric) fracture. 2. T-score of <-2.5 at the spine or hip. 3. Ten-year fracture probability by FRAX of 3% or greater for hip fracture or 20% or greater for major osteoporotic fracture. All treatment decisions require clinical judgment and consideration of individual patient factors, including patient preferences, co-morbidities, previous drug use, risk factors not captured in the FRAX model (e.g. falls, vitamin D deficiency, increased bone turnover, interval significant decline in bone density) and possible under - or over-estimation of fracture risk by FRAX. All patients should ensure an adequate intake of dietary calcium (1200 mg/d) and vitamin D (800 IU daily) unless contraindicated. FOLLOW-UP: People with diagnosed cases of osteoporosis or at high risk for fracture should have regular bone mineral density tests. For patients eligible for Medicare, routine testing is allowed once every 2 years. The testing frequency can be increased to one year for patients who have rapidly progressing disease, those who are receiving or discontinuing medical therapy to restore bone mass, or have additional risk factors.  I have reviewed this report, and agree with the above findings. [facility] Radiology Electronically Signed   By: Earle Gell M.D.   On: 06/24/2016 15:05    ELIGIBLE FOR AVAILABLE RESEARCH PROTOCOL: Not eligible for PALLAS because HER-2 positive  ASSESSMENT: 71 y.o. [place] woman status post left breast upper outer quadrant and left axillary lymph node biopsy 08/13/2015 both positive for an invasive ductal carcinoma, grade 2 or 3, triple positive, with an MIB-1 of 50%  (1) neoadjuvant chemotherapy consisting of carboplatin, docetaxel, trastuzumab and pertuzumab given every 21 days Started 09/11/2015  (a) treatment changed to Abraxane and trastuzumab with cycle 2 because of side effects after cycle 1  (b) Abraxane discontinued after 3d dose (10/17/2015) because of peripheral neuropathy  (c) cyclophosphamide, methotrexate and fluorouracil (CMF) started 11/07/2015, 4 cycles, last dose 01/09/2016   (2) trastuzumab will be continued to complete a year (Through June 2018).  (a) most recent echocardiogram 04/23/2016 showed an ejection fraction of 60-65 %   (3) status post left lumpectomy with TAD 02/14/2016 showing a residual pT1c pN1 invasive ductal carcinoma, grade 2, estrogen and progesterone receptor positive, now HER-2 not amplified; margins were negative  (a) left axillary seroma/abscess drained surgically 03/25/2016  (4) adjuvant radiation 05/19/16 - 06/29/16 Site/dose:    Left breast - 4 field             Left breast: 50 Gy in 25 fractions             SCLV/PAB: 45 Gy in 25 fractions Left breast boost: 10 GY in 5 fractions  (5) anastrozole started 07/07/2016  (a) bone density 06/24/2016 shows a T score of -0.7 (normal).  PLAN: Orlando Penner finally got done with her radiation treatments and she is recovering well. She still has slight dry desquamation. Hopefully this will heal very soon.  The biggest problem she is having of course is the pain in her right foot. She is going to be seeing Dr.  Doran Durand about this in a few weeks. In the meantime she is limited in what she can do for exercise. I have encouraged her  to try stationary bike and possibly swimming.  She does have a rather wonderful trip to [place] planned mid May.  At this point though she is ready to start anti-estrogens. We discussed the difference between tamoxifen and anastrozole in detail. She understands that anastrozole and the aromatase inhibitors in general work by blocking estrogen production. Accordingly vaginal dryness, decrease in bone density, and of course hot flashes can result. The aromatase inhibitors can also negatively affect the cholesterol profile, although that is a minor effect. One out of 5 women on aromatase inhibitors we will feel "old and achy". This arthralgia/myalgia syndrome, which resembles fibromyalgia clinically, does resolve with stopping the medications. Accordingly this is not a reason to not try an aromatase inhibitor but it is a frequent reason to stop it (in other words 20% of women will not be able to tolerate these medications).  Tamoxifen on the other hand does not block estrogen production. It does not "take away a woman's estrogen". It blocks the estrogen receptor in breast cells. Like anastrozole, it can also cause hot flashes. As opposed to anastrozole, tamoxifen has many estrogen-like effects. It is technically an estrogen receptor modulator. This means that in some tissues tamoxifen works like estrogen-- for example it helps strengthen the bones. It tends to improve the cholesterol profile. It can cause uterine problems, but that is not an issue with Orlando Penner since she status post hysterectomy. It can cause vaginal wetness or stickiness. It can cause blood clots through this estrogen-like effect--the risk of blood clots with tamoxifen is exactly the same as with birth control pills or hormone replacement.  Neither of these agents causes mood changes or weight gain, despite the popular belief  that they can have these side effects. We have data from studies comparing either of these drugs with placebo, and in those cases the control group had the same amount of weight gain and depression as the group that took the drug.  After much discussion she decided she would give anastrozole a try. She will started 07/07/2016. She is going to call with any problems that may develop before her return visit.  In the meantime she is continuing on trastuzumab, with a dose due today. She will have her next echocardiogram mid May. She will return to see me in late July.         Chauncey Cruel, MD   07/02/2016 12:57 PM Medical Oncology and Hematology [facility] thank you so much

## 2016-07-21 ENCOUNTER — Ambulatory Visit (HOSPITAL_BASED_OUTPATIENT_CLINIC_OR_DEPARTMENT_OTHER): Payer: BLUE CROSS/BLUE SHIELD

## 2016-07-21 ENCOUNTER — Other Ambulatory Visit (HOSPITAL_BASED_OUTPATIENT_CLINIC_OR_DEPARTMENT_OTHER): Payer: BLUE CROSS/BLUE SHIELD

## 2016-07-21 VITALS — BP 155/88 | HR 76 | Temp 98.4°F | Resp 18

## 2016-07-21 DIAGNOSIS — Z170 Estrogen receptor positive status [ER+]: Secondary | ICD-10-CM

## 2016-07-21 DIAGNOSIS — Z5112 Encounter for antineoplastic immunotherapy: Secondary | ICD-10-CM | POA: Diagnosis not present

## 2016-07-21 DIAGNOSIS — C50412 Malignant neoplasm of upper-outer quadrant of left female breast: Secondary | ICD-10-CM | POA: Diagnosis not present

## 2016-07-21 DIAGNOSIS — C773 Secondary and unspecified malignant neoplasm of axilla and upper limb lymph nodes: Secondary | ICD-10-CM

## 2016-07-21 LAB — CBC WITH DIFFERENTIAL/PLATELET
BASO%: 0.5 % (ref 0.0–2.0)
Basophils Absolute: 0 10*3/uL (ref 0.0–0.1)
EOS%: 3.1 % (ref 0.0–7.0)
Eosinophils Absolute: 0.1 10*3/uL (ref 0.0–0.5)
HCT: 34.5 % — ABNORMAL LOW (ref 34.8–46.6)
HGB: 11.2 g/dL — ABNORMAL LOW (ref 11.6–15.9)
LYMPH%: 12.6 % — ABNORMAL LOW (ref 14.0–49.7)
MCH: 26.3 pg (ref 25.1–34.0)
MCHC: 32.4 g/dL (ref 31.5–36.0)
MCV: 81.3 fL (ref 79.5–101.0)
MONO#: 0.4 10*3/uL (ref 0.1–0.9)
MONO%: 8.2 % (ref 0.0–14.0)
NEUT#: 3.7 10*3/uL (ref 1.5–6.5)
NEUT%: 75.6 % (ref 38.4–76.8)
Platelets: 245 10*3/uL (ref 145–400)
RBC: 4.24 10*6/uL (ref 3.70–5.45)
RDW: 17.2 % — ABNORMAL HIGH (ref 11.2–14.5)
WBC: 4.8 10*3/uL (ref 3.9–10.3)
lymph#: 0.6 10*3/uL — ABNORMAL LOW (ref 0.9–3.3)

## 2016-07-21 LAB — COMPREHENSIVE METABOLIC PANEL
ALT: 28 U/L (ref 0–55)
AST: 27 U/L (ref 5–34)
Albumin: 3.6 g/dL (ref 3.5–5.0)
Alkaline Phosphatase: 183 U/L — ABNORMAL HIGH (ref 40–150)
Anion Gap: 12 mEq/L — ABNORMAL HIGH (ref 3–11)
BUN: 12.8 mg/dL (ref 7.0–26.0)
CO2: 28 mEq/L (ref 22–29)
Calcium: 9.3 mg/dL (ref 8.4–10.4)
Chloride: 100 mEq/L (ref 98–109)
Creatinine: 1 mg/dL (ref 0.6–1.1)
EGFR: 58 mL/min/{1.73_m2} — ABNORMAL LOW (ref >90)
Glucose: 290 mg/dl — ABNORMAL HIGH (ref 70–140)
Potassium: 3.7 mEq/L (ref 3.5–5.1)
Sodium: 139 mEq/L (ref 136–145)
Total Bilirubin: 0.31 mg/dL (ref 0.20–1.20)
Total Protein: 6.9 g/dL (ref 6.4–8.3)

## 2016-07-21 MED ORDER — DIPHENHYDRAMINE HCL 25 MG PO CAPS
ORAL_CAPSULE | ORAL | Status: AC
  Filled 2016-07-21: qty 1

## 2016-07-21 MED ORDER — DIPHENHYDRAMINE HCL 25 MG PO CAPS
25.0000 mg | ORAL_CAPSULE | Freq: Once | ORAL | Status: AC
  Administered 2016-07-21: 25 mg via ORAL

## 2016-07-21 MED ORDER — SODIUM CHLORIDE 0.9% FLUSH
10.0000 mL | INTRAVENOUS | Status: DC | PRN
  Administered 2016-07-21: 10 mL
  Filled 2016-07-21: qty 10

## 2016-07-21 MED ORDER — SODIUM CHLORIDE 0.9 % IV SOLN
6.0000 mg/kg | Freq: Once | INTRAVENOUS | Status: AC
  Administered 2016-07-21: 462 mg via INTRAVENOUS
  Filled 2016-07-21: qty 22

## 2016-07-21 MED ORDER — SODIUM CHLORIDE 0.9 % IV SOLN
Freq: Once | INTRAVENOUS | Status: AC
  Administered 2016-07-21: 15:00:00 via INTRAVENOUS

## 2016-07-21 MED ORDER — ACETAMINOPHEN 325 MG PO TABS
ORAL_TABLET | ORAL | Status: AC
  Filled 2016-07-21: qty 2

## 2016-07-21 MED ORDER — ACETAMINOPHEN 325 MG PO TABS
650.0000 mg | ORAL_TABLET | Freq: Once | ORAL | Status: AC
  Administered 2016-07-21: 650 mg via ORAL

## 2016-07-21 MED ORDER — HEPARIN SOD (PORK) LOCK FLUSH 100 UNIT/ML IV SOLN
500.0000 [IU] | Freq: Once | INTRAVENOUS | Status: AC | PRN
  Administered 2016-07-21: 500 [IU]
  Filled 2016-07-21: qty 5

## 2016-07-21 NOTE — Patient Instructions (Signed)
[facility] Discharge Instructions for Patients Receiving Chemotherapy  Today you received the following chemotherapy agents: Herceptin   To help prevent nausea and vomiting after your treatment, we encourage you to take your nausea medication as directed.    If you develop nausea and vomiting that is not controlled by your nausea medication, call the clinic.   BELOW ARE SYMPTOMS THAT SHOULD BE REPORTED IMMEDIATELY:  *FEVER GREATER THAN 100.5 F  *CHILLS WITH OR WITHOUT FEVER  NAUSEA AND VOMITING THAT IS NOT CONTROLLED WITH YOUR NAUSEA MEDICATION  *UNUSUAL SHORTNESS OF BREATH  *UNUSUAL BRUISING OR BLEEDING  TENDERNESS IN MOUTH AND THROAT WITH OR WITHOUT PRESENCE OF ULCERS  *URINARY PROBLEMS  *BOWEL PROBLEMS  UNUSUAL RASH Items with * indicate a potential emergency and should be followed up as soon as possible.  Feel free to call the clinic you have any questions or concerns. The clinic phone number is [phone].  Please show the CHEMO ALERT CARD at check-in to the Emergency Department and triage nurse.   

## 2016-07-22 DIAGNOSIS — E11610 Type 2 diabetes mellitus with diabetic neuropathic arthropathy: Secondary | ICD-10-CM | POA: Diagnosis not present

## 2016-07-22 DIAGNOSIS — M2041 Other hammer toe(s) (acquired), right foot: Secondary | ICD-10-CM | POA: Diagnosis not present

## 2016-07-22 DIAGNOSIS — M14671 Charcot's joint, right ankle and foot: Secondary | ICD-10-CM | POA: Diagnosis not present

## 2016-07-22 DIAGNOSIS — M7671 Peroneal tendinitis, right leg: Secondary | ICD-10-CM | POA: Diagnosis not present

## 2016-07-23 ENCOUNTER — Encounter (HOSPITAL_COMMUNITY): Payer: Self-pay | Admitting: Internal Medicine

## 2016-07-23 ENCOUNTER — Ambulatory Visit (HOSPITAL_BASED_OUTPATIENT_CLINIC_OR_DEPARTMENT_OTHER)
Admission: RE | Admit: 2016-07-23 | Discharge: 2016-07-23 | Disposition: A | Discharge status: Home / Self Care | Payer: BLUE CROSS/BLUE SHIELD | Source: Ambulatory Visit | Attending: Internal Medicine | Admitting: Internal Medicine

## 2016-07-23 ENCOUNTER — Ambulatory Visit (HOSPITAL_COMMUNITY)
Admission: RE | Admit: 2016-07-23 | Discharge: 2016-07-23 | Disposition: A | Discharge status: Home / Self Care | Payer: BLUE CROSS/BLUE SHIELD | Source: Ambulatory Visit | Attending: Family Medicine | Admitting: Family Medicine

## 2016-07-23 VITALS — BP 142/74 | HR 78 | Wt 168.2 lb

## 2016-07-23 DIAGNOSIS — I10 Essential (primary) hypertension: Secondary | ICD-10-CM

## 2016-07-23 DIAGNOSIS — M7989 Other specified soft tissue disorders: Secondary | ICD-10-CM

## 2016-07-23 DIAGNOSIS — Z923 Personal history of irradiation: Secondary | ICD-10-CM | POA: Diagnosis not present

## 2016-07-23 DIAGNOSIS — I313 Pericardial effusion (noninflammatory): Secondary | ICD-10-CM | POA: Insufficient documentation

## 2016-07-23 DIAGNOSIS — Z170 Estrogen receptor positive status [ER+]: Secondary | ICD-10-CM

## 2016-07-23 DIAGNOSIS — Z9221 Personal history of antineoplastic chemotherapy: Secondary | ICD-10-CM | POA: Diagnosis not present

## 2016-07-23 DIAGNOSIS — Z86010 Personal history of colonic polyps: Secondary | ICD-10-CM | POA: Insufficient documentation

## 2016-07-23 DIAGNOSIS — R600 Localized edema: Secondary | ICD-10-CM | POA: Insufficient documentation

## 2016-07-23 DIAGNOSIS — C50912 Malignant neoplasm of unspecified site of left female breast: Secondary | ICD-10-CM | POA: Diagnosis not present

## 2016-07-23 DIAGNOSIS — C50412 Malignant neoplasm of upper-outer quadrant of left female breast: Secondary | ICD-10-CM

## 2016-07-23 DIAGNOSIS — E119 Type 2 diabetes mellitus without complications: Secondary | ICD-10-CM | POA: Diagnosis not present

## 2016-07-23 DIAGNOSIS — K449 Diaphragmatic hernia without obstruction or gangrene: Secondary | ICD-10-CM | POA: Diagnosis not present

## 2016-07-23 DIAGNOSIS — Z7984 Long term (current) use of oral hypoglycemic drugs: Secondary | ICD-10-CM | POA: Diagnosis not present

## 2016-07-23 DIAGNOSIS — Z7982 Long term (current) use of aspirin: Secondary | ICD-10-CM | POA: Insufficient documentation

## 2016-07-23 DIAGNOSIS — M1990 Unspecified osteoarthritis, unspecified site: Secondary | ICD-10-CM | POA: Insufficient documentation

## 2016-07-23 DIAGNOSIS — I3139 Other pericardial effusion (noninflammatory): Secondary | ICD-10-CM

## 2016-07-23 DIAGNOSIS — K219 Gastro-esophageal reflux disease without esophagitis: Secondary | ICD-10-CM | POA: Diagnosis not present

## 2016-07-23 MED ORDER — LOSARTAN POTASSIUM 25 MG PO TABS: 25.0000 mg | ORAL_TABLET | Freq: Every day | ORAL | 6 refills | Status: AC

## 2016-07-23 NOTE — Progress Notes (Signed)
  Echocardiogram 2D Echocardiogram has been performed.  Kristine Parrish 07/23/2016, 2:00 PM

## 2016-07-23 NOTE — Addendum Note (Signed)
Encounter addended by: Effie Berkshire, RN on: 07/23/2016  2:58 PM<BR>    Actions taken: Medication long-term status modified, Order list changed, Diagnosis association updated, Sign clinical note

## 2016-07-23 NOTE — Addendum Note (Signed)
Encounter addended by: Effie Berkshire, RN on: 07/23/2016  3:07 PM<BR>    Actions taken: Order list changed, Diagnosis association updated

## 2016-07-23 NOTE — Patient Instructions (Signed)
STOP Hydrochlorothiazide.  START Losartan 25 mg once daily.  Labs 1 weeks.  Follow up as needed.

## 2016-07-23 NOTE — Progress Notes (Signed)
CARDIO-ONCOLOGY CONSULT NOTE  Referring Physician: Magrinat Primary Care: Christella Noa Primary Cardiologist: New (Neziah Vogelgesang)  HPI:  Kristine Parrish is a 71 y/o female with history DM2, HTN, OA and left breast CA (triple positive). Referred by Dr. Jana Hakim for enrollment into the cardio-oncology clinic program.  Denies any h/o known heart disease although mother had strong h/o heart disease/HF.  Found to have triple + left breast CA in 6/17. Biopsy showed invasive ductal carcinoma, grade 2 or 3, estrogen receptor 100% positive, progesterone receptor 100% positive, both with strong staining intensity, with an MIB-1 of 50%, and HER-2 amplification, the signals ratio being 2.2 to and the number per cell 3.55  Treatment plan as follows;  (1) neoadjuvant chemotherapy consisting of carboplatin, docetaxel, trastuzumab and pertuzumab given every 21 days Started 09/11/2015             (a) treatment changed to Abraxane and trastuzumab with cycle 2 because of side effects after cycle 1             (b) Abraxane discontinued after 3d dose (10/17/2015) because of peripheral neuropathy             (c) cyclophosphamide, methotrexate and fluorouracil (CMF) started 11/07/2015, 4 cycles, last dose 01/09/2016   (2) trastuzumab will be continued to complete a year (Through June 2018).             (3) status post left lumpectomy with TAD 02/14/2016 showing a residual pT1c pN1 invasive ductal carcinoma, grade 2, estrogen and progesterone receptor positive, now HER-2 not amplified; margins were negative             (a) left axillary seroma/abscess drained surgically 03/25/2016  (4) adjuvant radiation completed 5/18  (5) anti-estrogens to follow at the completion of local treatment, with consideration of the PALLAS trial  Has finished XRT 2-3 weeks ago. Tolerating Herceptin well every 3 weeks. Still working full time. No CP, orthopnea or PND. Started spiro at last visit with improvement in edema. BP still  elevated with SBP 150s  Echo 04/23/16 EF 60-65% Grade 1 DD. GLS -18.4% LS' 8.4cm  Echo  (reviewed personally) EF 55-60% Grade 1 DD. GLS -18.1% LS' 10.35 cm/s    Past Medical History:  Diagnosis Date  . Arthritis    "hands "  . Breast cancer of upper-outer quadrant of left female breast ([place]) 08/15/2015  . Colon polyps   . Dyspnea    with exertion- "from chemo"  . GERD (gastroesophageal reflux disease)   . Hiatal hernia   . History of bronchitis   . Hypertension   . Neuropathy    from chemopathy  . Peptic ulcer disease   . Peripheral neuropathy   . Type 2 diabetes mellitus (HCC)    Type II    Current Outpatient Prescriptions  Medication Sig Dispense Refill  . anastrozole (ARIMIDEX) 1 MG tablet Take 1 tablet (1 mg total) by mouth daily. 90 tablet 4  . aspirin 81 MG tablet Take 81 mg by mouth daily.    Marland Kitchen gabapentin (NEURONTIN) 800 MG tablet Take 800 mg by mouth 3 (three) times daily.    . hydrochlorothiazide (MICROZIDE) 12.5 MG capsule Take 12.5 mg by mouth daily.    Marland Kitchen lovastatin (MEVACOR) 20 MG tablet Take 20 mg by mouth at bedtime.    . metFORMIN (GLUCOPHAGE) 500 MG tablet Take 500 mg by mouth 2 (two) times daily with a meal.     . Multiple Vitamin (MULTIVITAMIN) tablet Take 1 tablet by mouth  daily.    . omeprazole (PRILOSEC) 20 MG capsule Take 20 mg by mouth daily.    . potassium chloride (MICRO-K) 10 MEQ CR capsule TAKE 2 CAPSULES (20 MEQ TOTAL) BY MOUTH DAILY. 60 capsule 0  . Probiotic Product (PROBIOTIC DAILY PO) Take 1 tablet by mouth daily. Reported on 08/21/2015    . spironolactone (ALDACTONE) 25 MG tablet Take 0.5 tablets (12.5 mg total) by mouth daily. 45 tablet 3  . traMADol (ULTRAM) 50 MG tablet Take 1 tablet (50 mg total) by mouth every 6 (six) hours as needed. 60 tablet 3   No current facility-administered medications for this encounter.     Allergies  Allergen Reactions  . No Known Allergies       Social History   Social History  . Marital status: Widowed     Spouse name: N/A  . Number of children: 2  . Years of education: 12   Occupational History  . Not on file.   Social History Main Topics  . Smoking status: Never Smoker  . Smokeless tobacco: Never Used  . Alcohol use No  . Drug use: No  . Sexual activity: Not Currently   Other Topics Concern  . Not on file   Social History Narrative   Patient is divorced.   Patient has two children   Patient works in a clerical position.   Patient has a high school education.   Patient drinks caffeine rarely.      Family History  Problem Relation Age of Onset  . Diabetes Mother   . Hypertension Mother   . Alcoholism Maternal Grandfather   . Congestive Heart Failure Maternal Grandmother     Vitals:   07/23/16 1357  BP: (!) 142/74  Pulse: 78  SpO2: 99%  Weight: 168 lb 4 oz (76.3 kg)    PHYSICAL EXAM: General:  Well appearing. No resp difficulty HEENT: normal Neck: supple. R porta cath JVP 5-6. Carotids 2+ bilat; no bruits. No lymphadenopathy or thryomegaly appreciated. Cor: PMI nondisplaced. Regular rate & rhythm. No rubs, gallops or murmurs. Lungs: clear Abdomen: soft, nontender, nondistended. No hepatosplenomegaly. No bruits or masses. Good bowel sounds. Extremities: no cyanosis, clubbing, rash, trace edema Neuro: alert & orientedx3, cranial nerves grossly intact. moves all 4 extremities w/o difficulty. Affect pleasant   ASSESSMENT & PLAN:  1. Breast CA, left I reviewed echos personally. EF and Doppler parameters stable. No HF on exam. Continue Herceptin. Will not need another echo as she only has a couple of rounds left 2. HTN  --Improved but still slightly elevated. Will stop HCTZ. Add losartan 56m daily (given DM2). Continue spiro 12.5 --Get BMET 1 week 3. LLE edema --improved with spiro 4. Pericardial effusion --resolved on echo   Akiel Fennell,MD 2:45 PM

## 2016-07-31 ENCOUNTER — Ambulatory Visit: Payer: Self-pay | Admitting: Radiation Oncology

## 2016-08-05 ENCOUNTER — Encounter: Payer: Self-pay | Admitting: Radiation Oncology

## 2016-08-06 ENCOUNTER — Other Ambulatory Visit (HOSPITAL_COMMUNITY): Payer: BLUE CROSS/BLUE SHIELD

## 2016-08-06 DIAGNOSIS — M7671 Peroneal tendinitis, right leg: Secondary | ICD-10-CM | POA: Diagnosis not present

## 2016-08-07 ENCOUNTER — Ambulatory Visit
Admission: RE | Admit: 2016-08-07 | Discharge: 2016-08-07 | Disposition: A | Discharge status: Home / Self Care | Payer: BLUE CROSS/BLUE SHIELD | Source: Ambulatory Visit | Attending: Radiation Oncology | Admitting: Radiation Oncology

## 2016-08-07 ENCOUNTER — Encounter: Payer: Self-pay | Admitting: Radiation Oncology

## 2016-08-07 DIAGNOSIS — Z79899 Other long term (current) drug therapy: Secondary | ICD-10-CM | POA: Diagnosis not present

## 2016-08-07 DIAGNOSIS — M79671 Pain in right foot: Secondary | ICD-10-CM | POA: Diagnosis not present

## 2016-08-07 DIAGNOSIS — Z7984 Long term (current) use of oral hypoglycemic drugs: Secondary | ICD-10-CM | POA: Insufficient documentation

## 2016-08-07 DIAGNOSIS — Z170 Estrogen receptor positive status [ER+]: Secondary | ICD-10-CM | POA: Insufficient documentation

## 2016-08-07 DIAGNOSIS — Z923 Personal history of irradiation: Secondary | ICD-10-CM | POA: Diagnosis not present

## 2016-08-07 DIAGNOSIS — Z79811 Long term (current) use of aromatase inhibitors: Secondary | ICD-10-CM | POA: Insufficient documentation

## 2016-08-07 DIAGNOSIS — C50412 Malignant neoplasm of upper-outer quadrant of left female breast: Secondary | ICD-10-CM

## 2016-08-07 DIAGNOSIS — Z7982 Long term (current) use of aspirin: Secondary | ICD-10-CM | POA: Diagnosis not present

## 2016-08-07 HISTORY — DX: Personal history of irradiation: Z92.3

## 2016-08-07 NOTE — Progress Notes (Signed)
Kristine Parrish presents for follow up of radiation completed 06/29/16 to her Left Breast, SCLV/PAB. She denies pain to her Left Breast. She does have pain to her Right foot, which she has seen an orthopedist for and his attending PT. She continues to take trastuzumab through July. She is taking Anastrozole without difficulty. Her left Breast has healed. She has redness present, and some peeling present to her Left Breast. She was made aware that she can use a vitamin E cream to this area for skin protection.   BP 134/84   Pulse 91   Temp 98.5 F (36.9 C)   Ht 5' 4.5" (1.638 m)   Wt 166 lb 12.8 oz (75.7 kg)   SpO2 98% Comment: room air  BMI 28.19 kg/m    Wt Readings from Last 3 Encounters:  08/07/16 166 lb 12.8 oz (75.7 kg)  07/23/16 168 lb 4 oz (76.3 kg)  07/02/16 168 lb 1.6 oz (76.2 kg)

## 2016-08-07 NOTE — Progress Notes (Signed)
Radiation Oncology         [phone] ________________________________  Name: Kristine Parrish MRN: [id number]  Date: 08/07/2016  DOB: [birth year]  Follow-Up Visit Note  Outpatient  CC: Orpah Melter, MD  Magrinat, Virgie Dad, MD  Diagnosis and Prior Radiotherapy:    ICD-9-CM ICD-10-CM   1. Malignant neoplasm of upper-outer quadrant of left breast in female, estrogen receptor positive ([place]) 174.4 C50.412    V86.0 Z17.0    Clinical: Stage IIB (T2N1M0) Left Breast UOQ Invasive Ductal Carcinoma, ER+ / PR+ / Her2+, Grade 2-3 Pathologic: Stage IIA (ypT1c, ypN1a, M0) Left Breast UOQ Invasive Ductal Carcinoma, HER+ /PR+ /HER2 negative, Grade 2  05/19/16 - 06/29/16: 1) Left breast - 4 field             Left breast: 50 Gy in 25 fractions             SCLV/PAB: 45 Gy in 25 fractions 2) Left breast boost: 10 Gy in 5 fractions  CHIEF COMPLAINT: Here for follow-up and surveillance of left breast cancer.  Narrative:  The patient returns today for routine follow-up. The patient followed up with Dr. Jana Hakim on 07/02/16. She continues Herceptin maintenance every 3 weeks and started Anastrozole on 07/07/2016. The patient denies left breast pain, but she does have right foot pain for which she has seen an orthopedic and is attending PT. She denies issues with Anastrozole.  ALLERGIES:  is allergic to no known allergies.  Meds: Current Outpatient Prescriptions  Medication Sig Dispense Refill  . anastrozole (ARIMIDEX) 1 MG tablet Take 1 tablet (1 mg total) by mouth daily. 90 tablet 4  . aspirin 81 MG tablet Take 81 mg by mouth daily.    Marland Kitchen gabapentin (NEURONTIN) 800 MG tablet Take 800 mg by mouth 3 (three) times daily.    Marland Kitchen losartan (COZAAR) 25 MG tablet Take 1 tablet (25 mg total) by mouth daily. 30 tablet 6  . lovastatin (MEVACOR) 20 MG tablet Take 20 mg by mouth at bedtime.    . metFORMIN (GLUCOPHAGE) 500 MG tablet Take 500 mg by mouth 2 (two) times daily with a meal.     . Multiple Vitamin  (MULTIVITAMIN) tablet Take 1 tablet by mouth daily.    Marland Kitchen omeprazole (PRILOSEC) 20 MG capsule Take 20 mg by mouth daily.    . Probiotic Product (PROBIOTIC DAILY PO) Take 1 tablet by mouth daily. Reported on 08/21/2015    . spironolactone (ALDACTONE) 25 MG tablet Take 0.5 tablets (12.5 mg total) by mouth daily. 45 tablet 3  . traMADol (ULTRAM) 50 MG tablet Take 1 tablet (50 mg total) by mouth every 6 (six) hours as needed. 60 tablet 3   No current facility-administered medications for this encounter.     Physical Findings: The patient is in no acute distress. Patient is alert and oriented.  height is 5' 4.5" (1.638 m) and weight is 166 lb 12.8 oz (75.7 kg). Her temperature is 98.5 F (36.9 C). Her blood pressure is 134/84 and her pulse is 91. Her oxygen saturation is 98%. .    Mild erythema and swelling of the left breast with resolving dryness of the skin. She still has a firm seroma at the left axillary scar.  Lab Findings: Lab Results  Component Value Date   WBC 4.8 07/21/2016   HGB 11.2 (L) 07/21/2016   HCT 34.5 (L) 07/21/2016   MCV 81.3 07/21/2016   PLT 245 07/21/2016    Radiographic Findings: No results found.  Impression/Plan:  Healing well.  I encouraged her to continue with yearly mammography and followup with medical oncology. I will see her back on an as-needed basis. I have encouraged her to call if she has any issues or concerns in the future. I wished her the very best. She knows to apply vitamin E cream/lotion to the left breast to facilitate more skin healing.  _____________________________________   Eppie Gibson, MD  This document serves as a record of services personally performed by Eppie Gibson, MD. It was created on her behalf by Darcus Austin, a trained medical scribe. The creation of this record is based on the scribe's personal observations and the provider's statements to them. This document has been checked and approved by the attending provider.

## 2016-08-11 ENCOUNTER — Other Ambulatory Visit: Payer: Self-pay | Admitting: *Deleted

## 2016-08-11 DIAGNOSIS — M7671 Peroneal tendinitis, right leg: Secondary | ICD-10-CM | POA: Diagnosis not present

## 2016-08-13 ENCOUNTER — Ambulatory Visit (HOSPITAL_BASED_OUTPATIENT_CLINIC_OR_DEPARTMENT_OTHER): Payer: BLUE CROSS/BLUE SHIELD

## 2016-08-13 ENCOUNTER — Other Ambulatory Visit (HOSPITAL_BASED_OUTPATIENT_CLINIC_OR_DEPARTMENT_OTHER): Payer: BLUE CROSS/BLUE SHIELD

## 2016-08-13 VITALS — BP 139/75 | HR 83 | Temp 98.8°F | Resp 16

## 2016-08-13 DIAGNOSIS — Z5112 Encounter for antineoplastic immunotherapy: Secondary | ICD-10-CM | POA: Diagnosis not present

## 2016-08-13 DIAGNOSIS — C50412 Malignant neoplasm of upper-outer quadrant of left female breast: Secondary | ICD-10-CM

## 2016-08-13 DIAGNOSIS — Z170 Estrogen receptor positive status [ER+]: Secondary | ICD-10-CM

## 2016-08-13 LAB — COMPREHENSIVE METABOLIC PANEL
ALT: 19 U/L (ref 0–55)
AST: 15 U/L (ref 5–34)
Albumin: 3.4 g/dL — ABNORMAL LOW (ref 3.5–5.0)
Alkaline Phosphatase: 166 U/L — ABNORMAL HIGH (ref 40–150)
Anion Gap: 10 mEq/L (ref 3–11)
BUN: 10.8 mg/dL (ref 7.0–26.0)
CO2: 27 mEq/L (ref 22–29)
Calcium: 9.7 mg/dL (ref 8.4–10.4)
Chloride: 104 mEq/L (ref 98–109)
Creatinine: 0.9 mg/dL (ref 0.6–1.1)
EGFR: 66 mL/min/{1.73_m2} — ABNORMAL LOW (ref >90)
Glucose: 205 mg/dl — ABNORMAL HIGH (ref 70–140)
Potassium: 4.4 mEq/L (ref 3.5–5.1)
Sodium: 141 mEq/L (ref 136–145)
Total Bilirubin: 0.38 mg/dL (ref 0.20–1.20)
Total Protein: 6.9 g/dL (ref 6.4–8.3)

## 2016-08-13 LAB — CBC WITH DIFFERENTIAL/PLATELET
BASO%: 0.5 % (ref 0.0–2.0)
Basophils Absolute: 0 10*3/uL (ref 0.0–0.1)
EOS%: 3.6 % (ref 0.0–7.0)
Eosinophils Absolute: 0.2 10*3/uL (ref 0.0–0.5)
HCT: 34.5 % — ABNORMAL LOW (ref 34.8–46.6)
HGB: 11.2 g/dL — ABNORMAL LOW (ref 11.6–15.9)
LYMPH%: 12.2 % — ABNORMAL LOW (ref 14.0–49.7)
MCH: 26.8 pg (ref 25.1–34.0)
MCHC: 32.4 g/dL (ref 31.5–36.0)
MCV: 82.7 fL (ref 79.5–101.0)
MONO#: 0.4 10*3/uL (ref 0.1–0.9)
MONO%: 7.2 % (ref 0.0–14.0)
NEUT#: 4.2 10*3/uL (ref 1.5–6.5)
NEUT%: 76.5 % (ref 38.4–76.8)
Platelets: 257 10*3/uL (ref 145–400)
RBC: 4.17 10*6/uL (ref 3.70–5.45)
RDW: 17 % — ABNORMAL HIGH (ref 11.2–14.5)
WBC: 5.6 10*3/uL (ref 3.9–10.3)
lymph#: 0.7 10*3/uL — ABNORMAL LOW (ref 0.9–3.3)

## 2016-08-13 MED ORDER — HEPARIN SOD (PORK) LOCK FLUSH 100 UNIT/ML IV SOLN
500.0000 [IU] | Freq: Once | INTRAVENOUS | Status: AC | PRN
  Administered 2016-08-13: 500 [IU]
  Filled 2016-08-13: qty 5

## 2016-08-13 MED ORDER — TRASTUZUMAB CHEMO 150 MG IV SOLR
6.0000 mg/kg | Freq: Once | INTRAVENOUS | Status: AC
  Administered 2016-08-13: 462 mg via INTRAVENOUS
  Filled 2016-08-13: qty 22

## 2016-08-13 MED ORDER — DIPHENHYDRAMINE HCL 25 MG PO CAPS
ORAL_CAPSULE | ORAL | Status: AC
  Filled 2016-08-13: qty 1

## 2016-08-13 MED ORDER — SODIUM CHLORIDE 0.9 % IV SOLN
Freq: Once | INTRAVENOUS | Status: AC
  Administered 2016-08-13: 10:00:00 via INTRAVENOUS

## 2016-08-13 MED ORDER — DIPHENHYDRAMINE HCL 25 MG PO CAPS
25.0000 mg | ORAL_CAPSULE | Freq: Once | ORAL | Status: AC
  Administered 2016-08-13: 25 mg via ORAL

## 2016-08-13 MED ORDER — ACETAMINOPHEN 325 MG PO TABS
ORAL_TABLET | ORAL | Status: AC
  Filled 2016-08-13: qty 2

## 2016-08-13 MED ORDER — SODIUM CHLORIDE 0.9% FLUSH
10.0000 mL | INTRAVENOUS | Status: DC | PRN
  Administered 2016-08-13: 10 mL
  Filled 2016-08-13: qty 10

## 2016-08-13 MED ORDER — ACETAMINOPHEN 325 MG PO TABS
ORAL_TABLET | ORAL | Status: AC
  Filled 2016-08-13: qty 1

## 2016-08-13 MED ORDER — ACETAMINOPHEN 325 MG PO TABS
650.0000 mg | ORAL_TABLET | Freq: Once | ORAL | Status: AC
  Administered 2016-08-13: 650 mg via ORAL

## 2016-08-13 NOTE — Patient Instructions (Signed)
[facility] Discharge Instructions for Patients Receiving Chemotherapy  Today you received the following chemotherapy agents: trastuzumab (Herceptin)  To help prevent nausea and vomiting after your treatment, we encourage you to take your nausea medication as directed.  If you develop nausea and vomiting that is not controlled by your nausea medication, call the clinic.   BELOW ARE SYMPTOMS THAT SHOULD BE REPORTED IMMEDIATELY:  *FEVER GREATER THAN 100.5 F  *CHILLS WITH OR WITHOUT FEVER  NAUSEA AND VOMITING THAT IS NOT CONTROLLED WITH YOUR NAUSEA MEDICATION  *UNUSUAL SHORTNESS OF BREATH  *UNUSUAL BRUISING OR BLEEDING  TENDERNESS IN MOUTH AND THROAT WITH OR WITHOUT PRESENCE OF ULCERS  *URINARY PROBLEMS  *BOWEL PROBLEMS  UNUSUAL RASH Items with * indicate a potential emergency and should be followed up as soon as possible.  Feel free to call the clinic you have any questions or concerns. The clinic phone number is [phone].  Please show the [facility] at check-in to the Emergency Department and triage nurse.

## 2016-08-14 ENCOUNTER — Ambulatory Visit (HOSPITAL_COMMUNITY)
Admission: RE | Admit: 2016-08-14 | Discharge: 2016-08-14 | Disposition: A | Discharge status: Home / Self Care | Payer: BLUE CROSS/BLUE SHIELD | Source: Ambulatory Visit | Attending: Cardiology | Admitting: Cardiology

## 2016-08-14 DIAGNOSIS — M7671 Peroneal tendinitis, right leg: Secondary | ICD-10-CM | POA: Diagnosis not present

## 2016-08-14 DIAGNOSIS — Z170 Estrogen receptor positive status [ER+]: Secondary | ICD-10-CM | POA: Insufficient documentation

## 2016-08-14 DIAGNOSIS — C50412 Malignant neoplasm of upper-outer quadrant of left female breast: Secondary | ICD-10-CM | POA: Diagnosis not present

## 2016-08-14 LAB — BASIC METABOLIC PANEL
Anion gap: 8 (ref 5–15)
BUN: 7 mg/dL (ref 6–20)
CO2: 25 mmol/L (ref 22–32)
Calcium: 9.2 mg/dL (ref 8.9–10.3)
Chloride: 103 mmol/L (ref 101–111)
Creatinine, Ser: 0.82 mg/dL (ref 0.44–1.00)
GFR calc Af Amer: >60 mL/min (ref >60)
GFR calc non Af Amer: >60 mL/min (ref >60)
Glucose, Bld: 230 mg/dL — ABNORMAL HIGH (ref 65–99)
Potassium: 3.8 mmol/L (ref 3.5–5.1)
Sodium: 136 mmol/L (ref 135–145)

## 2016-08-18 DIAGNOSIS — M7671 Peroneal tendinitis, right leg: Secondary | ICD-10-CM | POA: Diagnosis not present

## 2016-08-20 ENCOUNTER — Telehealth: Payer: Self-pay

## 2016-08-20 MED ORDER — VENLAFAXINE HCL ER 37.5 MG PO CP24: 37.5000 mg | ORAL_CAPSULE | Freq: Every day | ORAL | 4 refills | Status: DC

## 2016-08-20 NOTE — Telephone Encounter (Signed)
Incoming fax from [facility] for venlafexine refill. Not currently on MAR. Called pt and she states she is using it prn for hot flashes so her prior rx from 08/21/15 lasted a long time. S/w Dr Jana Hakim and refill done.

## 2016-08-21 DIAGNOSIS — M7671 Peroneal tendinitis, right leg: Secondary | ICD-10-CM | POA: Diagnosis not present

## 2016-08-25 DIAGNOSIS — M7671 Peroneal tendinitis, right leg: Secondary | ICD-10-CM | POA: Diagnosis not present

## 2016-08-27 DIAGNOSIS — E11610 Type 2 diabetes mellitus with diabetic neuropathic arthropathy: Secondary | ICD-10-CM | POA: Diagnosis not present

## 2016-08-27 DIAGNOSIS — M7671 Peroneal tendinitis, right leg: Secondary | ICD-10-CM | POA: Diagnosis not present

## 2016-09-03 ENCOUNTER — Other Ambulatory Visit (HOSPITAL_BASED_OUTPATIENT_CLINIC_OR_DEPARTMENT_OTHER): Payer: BLUE CROSS/BLUE SHIELD

## 2016-09-03 ENCOUNTER — Ambulatory Visit (HOSPITAL_BASED_OUTPATIENT_CLINIC_OR_DEPARTMENT_OTHER): Payer: BLUE CROSS/BLUE SHIELD

## 2016-09-03 VITALS — BP 129/69 | HR 97 | Temp 97.7°F | Resp 16

## 2016-09-03 DIAGNOSIS — C50412 Malignant neoplasm of upper-outer quadrant of left female breast: Secondary | ICD-10-CM

## 2016-09-03 DIAGNOSIS — Z5112 Encounter for antineoplastic immunotherapy: Secondary | ICD-10-CM

## 2016-09-03 DIAGNOSIS — Z170 Estrogen receptor positive status [ER+]: Secondary | ICD-10-CM

## 2016-09-03 LAB — COMPREHENSIVE METABOLIC PANEL
ALT: 20 U/L (ref 0–55)
AST: 18 U/L (ref 5–34)
Albumin: 3.3 g/dL — ABNORMAL LOW (ref 3.5–5.0)
Alkaline Phosphatase: 180 U/L — ABNORMAL HIGH (ref 40–150)
Anion Gap: 12 mEq/L — ABNORMAL HIGH (ref 3–11)
BUN: 11.3 mg/dL (ref 7.0–26.0)
CO2: 25 mEq/L (ref 22–29)
Calcium: 9.5 mg/dL (ref 8.4–10.4)
Chloride: 104 mEq/L (ref 98–109)
Creatinine: 0.8 mg/dL (ref 0.6–1.1)
EGFR: 70 mL/min/{1.73_m2} — ABNORMAL LOW (ref >90)
Glucose: 249 mg/dl — ABNORMAL HIGH (ref 70–140)
Potassium: 4.2 mEq/L (ref 3.5–5.1)
Sodium: 141 mEq/L (ref 136–145)
Total Bilirubin: 0.35 mg/dL (ref 0.20–1.20)
Total Protein: 6.8 g/dL (ref 6.4–8.3)

## 2016-09-03 LAB — CBC WITH DIFFERENTIAL/PLATELET
BASO%: 0.6 % (ref 0.0–2.0)
Basophils Absolute: 0 10*3/uL (ref 0.0–0.1)
EOS%: 4.8 % (ref 0.0–7.0)
Eosinophils Absolute: 0.3 10*3/uL (ref 0.0–0.5)
HCT: 33.8 % — ABNORMAL LOW (ref 34.8–46.6)
HGB: 11 g/dL — ABNORMAL LOW (ref 11.6–15.9)
LYMPH%: 9.1 % — ABNORMAL LOW (ref 14.0–49.7)
MCH: 27 pg (ref 25.1–34.0)
MCHC: 32.6 g/dL (ref 31.5–36.0)
MCV: 82.8 fL (ref 79.5–101.0)
MONO#: 0.4 10*3/uL (ref 0.1–0.9)
MONO%: 7.1 % (ref 0.0–14.0)
NEUT#: 4.5 10*3/uL (ref 1.5–6.5)
NEUT%: 78.4 % — ABNORMAL HIGH (ref 38.4–76.8)
Platelets: 263 10*3/uL (ref 145–400)
RBC: 4.09 10*6/uL (ref 3.70–5.45)
RDW: 16.8 % — ABNORMAL HIGH (ref 11.2–14.5)
WBC: 5.7 10*3/uL (ref 3.9–10.3)
lymph#: 0.5 10*3/uL — ABNORMAL LOW (ref 0.9–3.3)

## 2016-09-03 MED ORDER — SODIUM CHLORIDE 0.9 % IV SOLN
Freq: Once | INTRAVENOUS | Status: AC
  Administered 2016-09-03: 10:00:00 via INTRAVENOUS

## 2016-09-03 MED ORDER — DIPHENHYDRAMINE HCL 25 MG PO CAPS
ORAL_CAPSULE | ORAL | Status: AC
  Filled 2016-09-03: qty 1

## 2016-09-03 MED ORDER — SODIUM CHLORIDE 0.9% FLUSH
10.0000 mL | INTRAVENOUS | Status: DC | PRN
  Administered 2016-09-03: 10 mL
  Filled 2016-09-03: qty 10

## 2016-09-03 MED ORDER — ACETAMINOPHEN 325 MG PO TABS
650.0000 mg | ORAL_TABLET | Freq: Once | ORAL | Status: AC
  Administered 2016-09-03: 650 mg via ORAL

## 2016-09-03 MED ORDER — DIPHENHYDRAMINE HCL 25 MG PO CAPS
25.0000 mg | ORAL_CAPSULE | Freq: Once | ORAL | Status: AC
Start: 2016-09-03 — End: 2016-09-03
  Administered 2016-09-03: 25 mg via ORAL

## 2016-09-03 MED ORDER — ACETAMINOPHEN 325 MG PO TABS
ORAL_TABLET | ORAL | Status: AC
  Filled 2016-09-03: qty 2

## 2016-09-03 MED ORDER — HEPARIN SOD (PORK) LOCK FLUSH 100 UNIT/ML IV SOLN
500.0000 [IU] | Freq: Once | INTRAVENOUS | Status: AC | PRN
  Administered 2016-09-03: 500 [IU]
  Filled 2016-09-03: qty 5

## 2016-09-03 MED ORDER — TRASTUZUMAB CHEMO 150 MG IV SOLR
6.0000 mg/kg | Freq: Once | INTRAVENOUS | Status: AC
  Administered 2016-09-03: 462 mg via INTRAVENOUS
  Filled 2016-09-03: qty 22

## 2016-09-03 NOTE — Patient Instructions (Signed)
Trastuzumab injection for infusion What is this medicine? TRASTUZUMAB (tras TOO zoo mab) is a monoclonal antibody. It is used to treat breast cancer and stomach cancer. This medicine may be used for other purposes; ask your health care provider or pharmacist if you have questions. COMMON BRAND NAME(S): Herceptin What should I tell my health care provider before I take this medicine? They need to know if you have any of these conditions: -heart disease -heart failure -lung or breathing disease, like asthma -an unusual or allergic reaction to trastuzumab, benzyl alcohol, or other medications, foods, dyes, or preservatives -pregnant or trying to get pregnant -breast-feeding How should I use this medicine? This drug is given as an infusion into a vein. It is administered in a hospital or clinic by a specially trained health care professional. Talk to your pediatrician regarding the use of this medicine in children. This medicine is not approved for use in children. Overdosage: If you think you have taken too much of this medicine contact a poison control center or emergency room at once. NOTE: This medicine is only for you. Do not share this medicine with others. What if I miss a dose? It is important not to miss a dose. Call your doctor or health care professional if you are unable to keep an appointment. What may interact with this medicine? This medicine may interact with the following medications: -certain types of chemotherapy, such as daunorubicin, doxorubicin, epirubicin, and idarubicin This list may not describe all possible interactions. Give your health care provider a list of all the medicines, herbs, non-prescription drugs, or dietary supplements you use. Also tell them if you smoke, drink alcohol, or use illegal drugs. Some items may interact with your medicine. What should I watch for while using this medicine? Visit your doctor for checks on your progress. Report any side effects.  Continue your course of treatment even though you feel ill unless your doctor tells you to stop. Call your doctor or health care professional for advice if you get a fever, chills or sore throat, or other symptoms of a cold or flu. Do not treat yourself. Try to avoid being around people who are sick. You may experience fever, chills and shaking during your first infusion. These effects are usually mild and can be treated with other medicines. Report any side effects during the infusion to your health care professional. Fever and chills usually do not happen with later infusions. Do not become pregnant while taking this medicine or for 7 months after stopping it. Women should inform their doctor if they wish to become pregnant or think they might be pregnant. Women of child-bearing potential will need to have a negative pregnancy test before starting this medicine. There is a potential for serious side effects to an unborn child. Talk to your health care professional or pharmacist for more information. Do not breast-feed an infant while taking this medicine or for 7 months after stopping it. Women must use effective birth control with this medicine. What side effects may I notice from receiving this medicine? Side effects that you should report to your doctor or health care professional as soon as possible: -allergic reactions like skin rash, itching or hives, swelling of the face, lips, or tongue -chest pain or palpitations -cough -dizziness -feeling faint or lightheaded, falls -fever -general ill feeling or flu-like symptoms -signs of worsening heart failure like breathing problems; swelling in your legs and feet -unusually weak or tired Side effects that usually do not require medical   attention (report to your doctor or health care professional if they continue or are bothersome): -bone pain -changes in taste -diarrhea -joint pain -nausea/vomiting -weight loss This list may not describe all  possible side effects. Call your doctor for medical advice about side effects. You may report side effects to FDA at 1-800-FDA-1088. Where should I keep my medicine? This drug is given in a hospital or clinic and will not be stored at home. NOTE: This sheet is a summary. It may not cover all possible information. If you have questions about this medicine, talk to your doctor, pharmacist, or health care provider.  2018 Elsevier/Gold Standard (2016-02-18 14:37:52)  

## 2016-09-04 DIAGNOSIS — M7671 Peroneal tendinitis, right leg: Secondary | ICD-10-CM | POA: Diagnosis not present

## 2016-09-11 DIAGNOSIS — M7671 Peroneal tendinitis, right leg: Secondary | ICD-10-CM | POA: Diagnosis not present

## 2016-09-18 DIAGNOSIS — M7671 Peroneal tendinitis, right leg: Secondary | ICD-10-CM | POA: Diagnosis not present

## 2016-09-23 ENCOUNTER — Other Ambulatory Visit: Payer: Self-pay | Admitting: Emergency Medicine

## 2016-09-23 DIAGNOSIS — Z170 Estrogen receptor positive status [ER+]: Secondary | ICD-10-CM

## 2016-09-23 DIAGNOSIS — C50412 Malignant neoplasm of upper-outer quadrant of left female breast: Secondary | ICD-10-CM

## 2016-09-24 ENCOUNTER — Ambulatory Visit (HOSPITAL_BASED_OUTPATIENT_CLINIC_OR_DEPARTMENT_OTHER): Payer: BLUE CROSS/BLUE SHIELD | Admitting: Oncology

## 2016-09-24 ENCOUNTER — Encounter: Payer: Self-pay | Admitting: *Deleted

## 2016-09-24 ENCOUNTER — Other Ambulatory Visit (HOSPITAL_BASED_OUTPATIENT_CLINIC_OR_DEPARTMENT_OTHER): Payer: BLUE CROSS/BLUE SHIELD

## 2016-09-24 ENCOUNTER — Ambulatory Visit (HOSPITAL_BASED_OUTPATIENT_CLINIC_OR_DEPARTMENT_OTHER): Payer: BLUE CROSS/BLUE SHIELD

## 2016-09-24 VITALS — BP 149/74 | HR 86 | Temp 98.1°F | Resp 18 | Ht 64.5 in | Wt 171.1 lb

## 2016-09-24 DIAGNOSIS — Z170 Estrogen receptor positive status [ER+]: Secondary | ICD-10-CM | POA: Diagnosis not present

## 2016-09-24 DIAGNOSIS — C50412 Malignant neoplasm of upper-outer quadrant of left female breast: Secondary | ICD-10-CM

## 2016-09-24 DIAGNOSIS — Z5112 Encounter for antineoplastic immunotherapy: Secondary | ICD-10-CM | POA: Diagnosis not present

## 2016-09-24 DIAGNOSIS — Z79811 Long term (current) use of aromatase inhibitors: Secondary | ICD-10-CM

## 2016-09-24 LAB — COMPREHENSIVE METABOLIC PANEL
ALT: 22 U/L (ref 0–55)
AST: 18 U/L (ref 5–34)
Albumin: 3.4 g/dL — ABNORMAL LOW (ref 3.5–5.0)
Alkaline Phosphatase: 194 U/L — ABNORMAL HIGH (ref 40–150)
Anion Gap: 10 mEq/L (ref 3–11)
BUN: 12.4 mg/dL (ref 7.0–26.0)
CO2: 26 mEq/L (ref 22–29)
Calcium: 9.5 mg/dL (ref 8.4–10.4)
Chloride: 102 mEq/L (ref 98–109)
Creatinine: 0.9 mg/dL (ref 0.6–1.1)
EGFR: 67 mL/min/{1.73_m2} — ABNORMAL LOW (ref >90)
Glucose: 198 mg/dl — ABNORMAL HIGH (ref 70–140)
Potassium: 4 mEq/L (ref 3.5–5.1)
Sodium: 138 mEq/L (ref 136–145)
Total Bilirubin: 0.29 mg/dL (ref 0.20–1.20)
Total Protein: 6.8 g/dL (ref 6.4–8.3)

## 2016-09-24 LAB — CBC WITH DIFFERENTIAL/PLATELET
BASO%: 0.4 % (ref 0.0–2.0)
Basophils Absolute: 0 10*3/uL (ref 0.0–0.1)
EOS%: 8 % — ABNORMAL HIGH (ref 0.0–7.0)
Eosinophils Absolute: 0.5 10*3/uL (ref 0.0–0.5)
HCT: 34.1 % — ABNORMAL LOW (ref 34.8–46.6)
HGB: 10.6 g/dL — ABNORMAL LOW (ref 11.6–15.9)
LYMPH%: 14.9 % (ref 14.0–49.7)
MCH: 26.8 pg (ref 25.1–34.0)
MCHC: 31.1 g/dL — ABNORMAL LOW (ref 31.5–36.0)
MCV: 86.1 fL (ref 79.5–101.0)
MONO#: 0.4 10*3/uL (ref 0.1–0.9)
MONO%: 7.1 % (ref 0.0–14.0)
NEUT#: 3.9 10*3/uL (ref 1.5–6.5)
NEUT%: 69.6 % (ref 38.4–76.8)
Platelets: 209 10*3/uL (ref 145–400)
RBC: 3.96 10*6/uL (ref 3.70–5.45)
RDW: 15.1 % — ABNORMAL HIGH (ref 11.2–14.5)
WBC: 5.7 10*3/uL (ref 3.9–10.3)
lymph#: 0.8 10*3/uL — ABNORMAL LOW (ref 0.9–3.3)

## 2016-09-24 MED ORDER — HEPARIN SOD (PORK) LOCK FLUSH 100 UNIT/ML IV SOLN
500.0000 [IU] | Freq: Once | INTRAVENOUS | Status: AC | PRN
  Administered 2016-09-24: 500 [IU]
  Filled 2016-09-24: qty 5

## 2016-09-24 MED ORDER — SODIUM CHLORIDE 0.9% FLUSH
10.0000 mL | INTRAVENOUS | Status: DC | PRN
  Administered 2016-09-24: 10 mL
  Filled 2016-09-24: qty 10

## 2016-09-24 MED ORDER — SODIUM CHLORIDE 0.9 % IV SOLN
Freq: Once | INTRAVENOUS | Status: AC
  Administered 2016-09-24: 12:00:00 via INTRAVENOUS

## 2016-09-24 MED ORDER — DIPHENHYDRAMINE HCL 25 MG PO CAPS
25.0000 mg | ORAL_CAPSULE | Freq: Once | ORAL | Status: AC
  Administered 2016-09-24: 25 mg via ORAL

## 2016-09-24 MED ORDER — TRASTUZUMAB CHEMO 150 MG IV SOLR
6.0000 mg/kg | Freq: Once | INTRAVENOUS | Status: AC
  Administered 2016-09-24: 462 mg via INTRAVENOUS
  Filled 2016-09-24: qty 22

## 2016-09-24 MED ORDER — ACETAMINOPHEN 325 MG PO TABS
650.0000 mg | ORAL_TABLET | Freq: Once | ORAL | Status: AC
  Administered 2016-09-24: 650 mg via ORAL

## 2016-09-24 MED ORDER — ACETAMINOPHEN 325 MG PO TABS
ORAL_TABLET | ORAL | Status: AC
  Filled 2016-09-24: qty 2

## 2016-09-24 MED ORDER — DIPHENHYDRAMINE HCL 25 MG PO CAPS
ORAL_CAPSULE | ORAL | Status: AC
  Filled 2016-09-24: qty 1

## 2016-09-24 NOTE — Progress Notes (Signed)
[place]  [phone] [phone]     ID: ADNA NOFZIGER DOB: 12/28/1945  MR#: [id number]  VAN#:[id number]  Patient Care Team: Orpah Melter, MD as PCP - General (Family Medicine) Jovita Kussmaul, MD as Consulting Physician (General Surgery) Rashea Hoskie, Virgie Dad, MD as Consulting Physician (Oncology) Eppie Gibson, MD as Attending Physician (Radiation Oncology) Murvin Donning, MD (Dermatology) Laurence Spates, MD as Consulting Physician (Gastroenterology) Rosemary Holms, DPM as Consulting Physician (Podiatry) Maisie Fus, MD as Consulting Physician (Obstetrics and Gynecology) Larey Dresser, MD as Consulting Physician (Cardiology) Wylene Simmer, MD as Consulting Physician (Orthopedic Surgery) PCP: Orpah Melter, MD OTHER MD:  CHIEF COMPLAINT: Estrogen receptor positive breast cancer  CURRENT TREATMENT: anti-HER-2 immunotherapy; anastrozole   BREAST CANCER HISTORY: From the original intake note  Lauranne herself noted a change in her left breast and brought it to the attention of Dr. Nori Riis, who set her up for bilateral diagnostic mammography with tomography and left breast ultrasonography at the [facility] 08/09/2015. The breast density was category D. In the area of palpable concern there was a mass with irregular margins and architectural distortion, measuring approximately 3 cm. This was palpable in the upper outer quadrant near the nipple. The ultrasound confirmed an irregular hypoechoic mass approximately 3 cm from the nipple at the 10:00 position measuring 2.7 cm. Ultrasound of the left axilla found a single level I left axillary node with focal cortical thickening.  On 08/13/2015 the patient underwent biopsy of the left breast mass and the suspicious left axillary lymph node, both showing invasive ductal carcinoma, grade 2 or 3, estrogen receptor 100% positive, progesterone receptor 100% positive, both with strong staining intensity, with  an MIB-1 of 50%, and HER-2 amplification, the signals ratio being 2.2 to and the number per cell 3.55  The patient's subsequent history is as detailed below  INTERVAL HISTORY: Sidnie returns today for follow-up and treatment of her triple positive breast cancer accompanied by her daughter. At the last visit here she was started on anastrozole. She is tolerating it well. Hot flashes are only occasionally not increased from baseline. She doesn't have vaginal dryness problems. She has a little bit of discomfort particularly in the lower legs particularly in the evening, but this is not all that different from before and she is obtaining the drug currently at no cost. Overall anastrozole and she are agreeable.  She will receive her final trastuzumab dose today. Her final echocardiogram in May showed an excellent ejection fraction  REVIEW OF SYSTEMS: Mckensey has some discomfort in both shoulders. She has significant problems with the right foot, and doesn't think this will really get much better. She has worked with podiatry and also on this and has had physical therapy. She can walk or stand for up to 45 minutes but after that it gets quite painful and she has to sit. Aside from these issues a detailed review of systems today was stable  PAST MEDICAL HISTORY: Past Medical History:  Diagnosis Date  . Arthritis    "hands "  . Breast cancer of upper-outer quadrant of left female breast ([place]) 08/15/2015  . Colon polyps   . Dyspnea    with exertion- "from chemo"  . GERD (gastroesophageal reflux disease)   . Hiatal hernia   . History of bronchitis   . History of radiation therapy 05/19/16- 06/29/16   Left Breast 50 Gy in 25 fractions, SCLV/PAB 45 Gy in 25 fractions, Left Breast boost 10 Gy in 5  fractions.   . Hypertension   . Neuropathy    from chemopathy  . Peptic ulcer disease   . Peripheral neuropathy   . Type 2 diabetes mellitus ([place])    Type II    PAST SURGICAL HISTORY: Past Surgical History:   Procedure Laterality Date  . APPENDECTOMY  1974  . cataract surgery Bilateral 04/2012  . CHOLECYSTECTOMY  1996  . COLONOSCOPY    . FOOT SURGERY     left bunion removed  . nerve removed from left foot  2000  . PORTACATH PLACEMENT N/A 09/05/2015   Procedure: INSERTION PORT-A-CATH;  Surgeon: Autumn Messing III, MD;  Location: [facility];  Service: General;  Laterality: N/A;  . RADIOACTIVE SEED GUIDED PARTIAL MASTECTOMY/AXILLARY SENTINEL NODE BIOPSY/AXILLARY NODE DISSECTION Left 02/14/2016   Procedure: LEFT BREAST RADIOACTIVE SEED GUIDED LUMPECTOMY WITH LEFT  SENTINEL LYMPH NODE BIOPSY AND LEFT  SEED TARGETED DISSECTION;  Surgeon: Autumn Messing III, MD;  Location: [facility];  Service: General;  Laterality: Left;  . TUBAL LIGATION    . VAGINAL HYSTERECTOMY  1995    FAMILY HISTORY Family History  Problem Relation Age of Onset  . Diabetes Mother   . Hypertension Mother   . Alcoholism Maternal Grandfather   . Congestive Heart Failure Maternal Grandmother   The patient has little information about her father and is not sure of his cause of death or age at death. The patient's mother died at age 31. The patient had no siblings. She was raised by her grandmother. She is not aware of any breast or ovarian cancer history in the family   GYNECOLOGIC HISTORY:  No LMP recorded. Patient has had a hysterectomy.  menarche age 88, first live birth age 26. The patient is GX P2. She underwent hysterectomy with bilateral salpingo-oophorectomy in 1994, and has been on estrogen replacement since that time, discontinued June 2017.   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 She is divorced and lives alone, with 2 cats. Daughter [name] lives in [place] and works as a Teacher, music for the  [employer]. Son Timisha Mondry lives in [place] where he is Secondary school teacher. The patient had one grandchild. She attends [employer]. The patient's daughter [name] is her healthcare power of attorney.     HEALTH MAINTENANCE: Social History  Substance Use Topics  . Smoking status: Never Smoker  . Smokeless tobacco: Never Used  . Alcohol use No     Colonoscopy: 2012/[facility]  PAP:  Bone density: 2015/"normal" at Dr. Verlon Au  Lipid panel:  Allergies  Allergen Reactions  . No Known Allergies     Current Outpatient Prescriptions  Medication Sig Dispense Refill  . anastrozole (ARIMIDEX) 1 MG tablet Take 1 tablet (1 mg total) by mouth daily. 90 tablet 4  . aspirin 81 MG tablet Take 81 mg by mouth daily.    Marland Kitchen gabapentin (NEURONTIN) 800 MG tablet Take 800 mg by mouth 3 (three) times daily.    Marland Kitchen losartan (COZAAR) 25 MG tablet Take 1 tablet (25 mg total) by mouth daily. 30 tablet 6  . lovastatin (MEVACOR) 20 MG tablet Take 20 mg by mouth at bedtime.    . metFORMIN (GLUCOPHAGE) 500 MG tablet Take 500 mg by mouth 2 (two) times daily with a meal.     . Multiple Vitamin (MULTIVITAMIN) tablet Take 1 tablet by mouth daily.    Marland Kitchen omeprazole (  PRILOSEC) 20 MG capsule Take 20 mg by mouth daily.    . Probiotic Product (PROBIOTIC DAILY PO) Take 1 tablet by mouth daily. Reported on 08/21/2015    . spironolactone (ALDACTONE) 25 MG tablet Take 0.5 tablets (12.5 mg total) by mouth daily. 45 tablet 3  . traMADol (ULTRAM) 50 MG tablet Take 1 tablet (50 mg total) by mouth every 6 (six) hours as needed. 60 tablet 3  . venlafaxine XR (EFFEXOR-XR) 37.5 MG 24 hr capsule Take 1 capsule (37.5 mg total) by mouth daily with breakfast. 30 capsule 4   No current facility-administered medications for this visit.     OBJECTIVE: Middle-aged white womanIn no acute distress  Vitals:   09/24/16 1013  BP: (!) 149/74  Pulse: 86  Resp: 18  Temp: 98.1 F (36.7 C)     Body mass index is 28.92 kg/m.    ECOG FS:1 - Symptomatic but completely ambulatory  Sclerae unicteric, pupils round and equal Oropharynx clear and moist No  cervical or supraclavicular adenopathy Lungs no rales or rhonchi Heart regular rate and rhythm Abd soft, nontender, positive bowel sounds MSK no focal spinal tenderness, no upper extremity lymphedema Neuro: nonfocal, well oriented, appropriate affect Breasts: The right breast is benign. The left breast is status post lumpectomy and radiation. There is the expected skin thickening, but otherwise the cosmetic result is excellent. In the left axilla there is a palpable mass known to be a seroma. It is not tender. The right axilla is benign.  LAB RESULTS:  CMP     Component Value Date/Time   NA 138 09/24/2016 1003   K 4.0 09/24/2016 1003   CL 103 08/14/2016 1134   CO2 26 09/24/2016 1003   GLUCOSE 198 (H) 09/24/2016 1003   BUN 12.4 09/24/2016 1003   CREATININE 0.9 09/24/2016 1003   CALCIUM 9.5 09/24/2016 1003   PROT 6.8 09/24/2016 1003   ALBUMIN 3.4 (L) 09/24/2016 1003   AST 18 09/24/2016 1003   ALT 22 09/24/2016 1003   ALKPHOS 194 (H) 09/24/2016 1003   BILITOT 0.29 09/24/2016 1003   GFRNONAA >60 08/14/2016 1134   GFRAA >60 08/14/2016 1134    INo results found for: SPEP, UPEP  Lab Results  Component Value Date   WBC 5.7 09/24/2016   NEUTROABS 3.9 09/24/2016   HGB 10.6 (L) 09/24/2016   HCT 34.1 (L) 09/24/2016   MCV 86.1 09/24/2016   PLT 209 09/24/2016      Chemistry      Component Value Date/Time   NA 138 09/24/2016 1003   K 4.0 09/24/2016 1003   CL 103 08/14/2016 1134   CO2 26 09/24/2016 1003   BUN 12.4 09/24/2016 1003   CREATININE 0.9 09/24/2016 1003      Component Value Date/Time   CALCIUM 9.5 09/24/2016 1003   ALKPHOS 194 (H) 09/24/2016 1003   AST 18 09/24/2016 1003   ALT 22 09/24/2016 1003   BILITOT 0.29 09/24/2016 1003       No results found for: LABCA2  No components found for: LABCA125  No results for input(s): INR in the last 168 hours.  Urinalysis No results found for: COLORURINE, APPEARANCEUR, LABSPEC, PHURINE, GLUCOSEU, HGBUR, BILIRUBINUR,  KETONESUR, PROTEINUR, UROBILINOGEN, NITRITE, LEUKOCYTESUR  STUDIES: Bone density results from April reviewed with patient  ELIGIBLE FOR AVAILABLE RESEARCH PROTOCOL: Not eligible for PALLAS because HER-2 positive  ASSESSMENT: 71 y.o. [place] woman status post left breast upper outer quadrant and left axillary lymph node biopsy 08/13/2015 both positive for an  invasive ductal carcinoma, grade 2 or 3, triple positive, with an MIB-1 of 50%  (1) neoadjuvant chemotherapy consisting of carboplatin, docetaxel, trastuzumab and pertuzumab given every 21 days Started 09/11/2015  (a) treatment changed to Abraxane and trastuzumab with cycle 2 because of side effects after cycle 1  (b) Abraxane discontinued after 3d dose (10/17/2015) because of peripheral neuropathy  (c) cyclophosphamide, methotrexate and fluorouracil (CMF) started 11/07/2015, 4 cycles, last dose 01/09/2016   (2) trastuzumab will be continued to complete a year (Through June 2018).  (a) final echocardiogram 07/23/2016 showed a well-preserved ejection fraction at 55-60%  (3) status post left lumpectomy with TAD 02/14/2016 showing a residual pT1c pN1 invasive ductal carcinoma, grade 2, estrogen and progesterone receptor positive, now HER-2 not amplified; margins were negative  (a) left axillary seroma/abscess drained surgically 03/25/2016  (4) adjuvant radiation 05/19/16 - 06/29/16 Site/dose:    Left breast - 4 field             Left breast: 50 Gy in 25 fractions             SCLV/PAB: 45 Gy in 25 fractions Left breast boost: 10 GY in 5 fractions  (5) anastrozole started 07/07/2016  (a) bone density 06/24/2016 shows a T score of -0.7 (normal).  PLAN: Ingri is completing her year of trastuzumab today. This is a real milestone and she gets to "rings of bowel".  She is tolerating anastrozole well. The plan will be to continue that a total of 5 years.  We are starting with a normal bone density. This will be repeated in 2 years.  I  have let her surgeon know that she may have her port removed at his and her discretion  She will have her next set of mammography early October and see me shortly after that. I expect she will then see her surgeon in January so she will then see me again in April and October of next year and after the October 2019 visit I will start seeing her on a once a year basis.  I think she would feel better if she were a little bit more active. I gave her information on our tai chi class and [facility] [place] [facility] Particularly her shoulders will benefit from range of motion exercises  She knows to call for any other issues that may develop before her next visit.        Chauncey Cruel, MD   09/24/2016 10:43 AM Medical Oncology and Hematology [facility] [place] thank you so much

## 2016-09-24 NOTE — Patient Instructions (Signed)
[facility] Discharge Instructions for Patients Receiving Chemotherapy  Today you received the following chemotherapy agents: trastuzumab (Herceptin)  To help prevent nausea and vomiting after your treatment, we encourage you to take your nausea medication as directed.  If you develop nausea and vomiting that is not controlled by your nausea medication, call the clinic.   BELOW ARE SYMPTOMS THAT SHOULD BE REPORTED IMMEDIATELY:  *FEVER GREATER THAN 100.5 F  *CHILLS WITH OR WITHOUT FEVER  NAUSEA AND VOMITING THAT IS NOT CONTROLLED WITH YOUR NAUSEA MEDICATION  *UNUSUAL SHORTNESS OF BREATH  *UNUSUAL BRUISING OR BLEEDING  TENDERNESS IN MOUTH AND THROAT WITH OR WITHOUT PRESENCE OF ULCERS  *URINARY PROBLEMS  *BOWEL PROBLEMS  UNUSUAL RASH Items with * indicate a potential emergency and should be followed up as soon as possible.  Feel free to call the clinic you have any questions or concerns. The clinic phone number is [phone].  Please show the [facility] at check-in to the Emergency Department and triage nurse.

## 2016-09-25 ENCOUNTER — Telehealth: Payer: Self-pay | Admitting: Oncology

## 2016-09-25 NOTE — Telephone Encounter (Signed)
sw pt to confirm [facility]CP appt 10/22 at 11 am per sch msg

## 2016-09-29 DIAGNOSIS — C50412 Malignant neoplasm of upper-outer quadrant of left female breast: Secondary | ICD-10-CM | POA: Diagnosis not present

## 2016-10-01 ENCOUNTER — Other Ambulatory Visit: Payer: Self-pay | Admitting: Oncology

## 2016-10-01 DIAGNOSIS — N6489 Other specified disorders of breast: Secondary | ICD-10-CM

## 2016-10-15 ENCOUNTER — Other Ambulatory Visit: Payer: Self-pay | Admitting: Oncology

## 2016-10-15 DIAGNOSIS — N6489 Other specified disorders of breast: Secondary | ICD-10-CM

## 2016-10-20 ENCOUNTER — Other Ambulatory Visit: Payer: Self-pay | Admitting: Oncology

## 2016-10-20 ENCOUNTER — Ambulatory Visit
Admission: RE | Admit: 2016-10-20 | Discharge: 2016-10-20 | Disposition: A | Discharge status: Home / Self Care | Payer: BLUE CROSS/BLUE SHIELD | Source: Ambulatory Visit | Attending: Oncology | Admitting: Oncology

## 2016-10-20 DIAGNOSIS — N6489 Other specified disorders of breast: Secondary | ICD-10-CM

## 2016-10-20 DIAGNOSIS — R922 Inconclusive mammogram: Secondary | ICD-10-CM | POA: Diagnosis not present

## 2016-10-20 HISTORY — DX: Personal history of antineoplastic chemotherapy: Z92.21

## 2016-10-20 HISTORY — DX: Personal history of irradiation: Z92.3

## 2016-10-23 DIAGNOSIS — E11610 Type 2 diabetes mellitus with diabetic neuropathic arthropathy: Secondary | ICD-10-CM | POA: Diagnosis not present

## 2016-10-23 DIAGNOSIS — M2041 Other hammer toe(s) (acquired), right foot: Secondary | ICD-10-CM | POA: Diagnosis not present

## 2016-10-23 DIAGNOSIS — E1142 Type 2 diabetes mellitus with diabetic polyneuropathy: Secondary | ICD-10-CM | POA: Diagnosis not present

## 2016-10-23 DIAGNOSIS — M7671 Peroneal tendinitis, right leg: Secondary | ICD-10-CM | POA: Diagnosis not present

## 2016-10-23 DIAGNOSIS — M14671 Charcot's joint, right ankle and foot: Secondary | ICD-10-CM | POA: Diagnosis not present

## 2016-11-02 DIAGNOSIS — Z853 Personal history of malignant neoplasm of breast: Secondary | ICD-10-CM | POA: Diagnosis not present

## 2016-11-02 DIAGNOSIS — Z452 Encounter for adjustment and management of vascular access device: Secondary | ICD-10-CM | POA: Diagnosis not present

## 2016-12-03 DIAGNOSIS — E7800 Pure hypercholesterolemia, unspecified: Secondary | ICD-10-CM | POA: Diagnosis not present

## 2016-12-03 DIAGNOSIS — E1149 Type 2 diabetes mellitus with other diabetic neurological complication: Secondary | ICD-10-CM | POA: Diagnosis not present

## 2016-12-03 DIAGNOSIS — E11610 Type 2 diabetes mellitus with diabetic neuropathic arthropathy: Secondary | ICD-10-CM | POA: Diagnosis not present

## 2016-12-03 DIAGNOSIS — G629 Polyneuropathy, unspecified: Secondary | ICD-10-CM | POA: Diagnosis not present

## 2016-12-03 DIAGNOSIS — I10 Essential (primary) hypertension: Secondary | ICD-10-CM | POA: Diagnosis not present

## 2016-12-04 DIAGNOSIS — K640 First degree hemorrhoids: Secondary | ICD-10-CM | POA: Diagnosis not present

## 2016-12-04 DIAGNOSIS — D126 Benign neoplasm of colon, unspecified: Secondary | ICD-10-CM | POA: Diagnosis not present

## 2016-12-04 DIAGNOSIS — Z86010 Personal history of colonic polyps: Secondary | ICD-10-CM | POA: Diagnosis not present

## 2016-12-22 ENCOUNTER — Encounter: Payer: Self-pay | Admitting: Pharmacist

## 2016-12-22 DIAGNOSIS — Z170 Estrogen receptor positive status [ER+]: Secondary | ICD-10-CM

## 2016-12-22 DIAGNOSIS — C50412 Malignant neoplasm of upper-outer quadrant of left female breast: Secondary | ICD-10-CM

## 2016-12-22 NOTE — Progress Notes (Signed)
Telephone documentation  Study code: [id number]  Spoke with patient over the phone today and informed her that her buccal swab for "Pharmacogenetic analysis of toxicities related to administration of taxanes in breast cancer patients" study did NOT contain enough DNA to yield results. Provide my office number to the patient if she had additional questions.  Darl Pikes, PharmD, BCPS Hematology/Oncology Clinical Pharmacist [facility] [phone]

## 2016-12-23 ENCOUNTER — Telehealth: Payer: Self-pay

## 2016-12-23 NOTE — Telephone Encounter (Signed)
Spoke with Patient to remind of SCP visit appt on 10/22 at 11 am.  Patient had no questions at this time and stated she would come for appt.

## 2016-12-24 ENCOUNTER — Inpatient Hospital Stay: Admission: RE | Admit: 2016-12-24 | Payer: BLUE CROSS/BLUE SHIELD | Source: Ambulatory Visit

## 2016-12-24 ENCOUNTER — Encounter: Payer: BLUE CROSS/BLUE SHIELD | Admitting: Adult Health

## 2016-12-28 ENCOUNTER — Ambulatory Visit (HOSPITAL_BASED_OUTPATIENT_CLINIC_OR_DEPARTMENT_OTHER): Payer: BLUE CROSS/BLUE SHIELD | Admitting: Adult Health

## 2016-12-28 ENCOUNTER — Encounter: Payer: Self-pay | Admitting: Adult Health

## 2016-12-28 ENCOUNTER — Other Ambulatory Visit (HOSPITAL_BASED_OUTPATIENT_CLINIC_OR_DEPARTMENT_OTHER): Payer: BLUE CROSS/BLUE SHIELD

## 2016-12-28 ENCOUNTER — Telehealth: Payer: Self-pay | Admitting: Oncology

## 2016-12-28 VITALS — BP 153/76 | HR 89 | Temp 98.4°F | Resp 18 | Ht 64.5 in | Wt 172.5 lb

## 2016-12-28 DIAGNOSIS — E1140 Type 2 diabetes mellitus with diabetic neuropathy, unspecified: Secondary | ICD-10-CM

## 2016-12-28 DIAGNOSIS — M79672 Pain in left foot: Secondary | ICD-10-CM

## 2016-12-28 DIAGNOSIS — Z170 Estrogen receptor positive status [ER+]: Secondary | ICD-10-CM

## 2016-12-28 DIAGNOSIS — Z79811 Long term (current) use of aromatase inhibitors: Secondary | ICD-10-CM | POA: Diagnosis not present

## 2016-12-28 DIAGNOSIS — C50412 Malignant neoplasm of upper-outer quadrant of left female breast: Secondary | ICD-10-CM

## 2016-12-28 LAB — COMPREHENSIVE METABOLIC PANEL
ALT: 17 U/L (ref 0–55)
AST: 17 U/L (ref 5–34)
Albumin: 3.6 g/dL (ref 3.5–5.0)
Alkaline Phosphatase: 158 U/L — ABNORMAL HIGH (ref 40–150)
Anion Gap: 10 mEq/L (ref 3–11)
BUN: 16.9 mg/dL (ref 7.0–26.0)
CO2: 26 mEq/L (ref 22–29)
Calcium: 9.3 mg/dL (ref 8.4–10.4)
Chloride: 104 mEq/L (ref 98–109)
Creatinine: 0.9 mg/dL (ref 0.6–1.1)
EGFR: >60 mL/min/{1.73_m2} (ref >60)
Glucose: 173 mg/dl — ABNORMAL HIGH (ref 70–140)
Potassium: 4.1 mEq/L (ref 3.5–5.1)
Sodium: 140 mEq/L (ref 136–145)
Total Bilirubin: 0.36 mg/dL (ref 0.20–1.20)
Total Protein: 7 g/dL (ref 6.4–8.3)

## 2016-12-28 LAB — CBC WITH DIFFERENTIAL/PLATELET
BASO%: 0.2 % (ref 0.0–2.0)
Basophils Absolute: 0 10*3/uL (ref 0.0–0.1)
EOS%: 3.6 % (ref 0.0–7.0)
Eosinophils Absolute: 0.2 10*3/uL (ref 0.0–0.5)
HCT: 34.9 % (ref 34.8–46.6)
HGB: 10.9 g/dL — ABNORMAL LOW (ref 11.6–15.9)
LYMPH%: 17.2 % (ref 14.0–49.7)
MCH: 27 pg (ref 25.1–34.0)
MCHC: 31.2 g/dL — ABNORMAL LOW (ref 31.5–36.0)
MCV: 86.6 fL (ref 79.5–101.0)
MONO#: 0.4 10*3/uL (ref 0.1–0.9)
MONO%: 6.4 % (ref 0.0–14.0)
NEUT#: 4.2 10*3/uL (ref 1.5–6.5)
NEUT%: 72.6 % (ref 38.4–76.8)
Platelets: 186 10*3/uL (ref 145–400)
RBC: 4.03 10*6/uL (ref 3.70–5.45)
RDW: 15.3 % — ABNORMAL HIGH (ref 11.2–14.5)
WBC: 5.8 10*3/uL (ref 3.9–10.3)
lymph#: 1 10*3/uL (ref 0.9–3.3)

## 2016-12-28 NOTE — Telephone Encounter (Signed)
Gave patient avs and calendar with appts per 10/22 los.

## 2016-12-28 NOTE — Progress Notes (Signed)
CLINIC:  Survivorship   REASON FOR VISIT:  Routine follow-up post-treatment for a recent history of breast cancer.  BRIEF ONCOLOGIC HISTORY:    Malignant neoplasm of upper-outer quadrant of left breast in female, estrogen receptor positive ([place])   08/13/2015 Initial Biopsy    Left breast upper outer quadrant and left axillary lymph node biopsy: both positive for IDC, g2-3, ER+(100%), PR+(100%), KI-67 50%, HER-2 positive (ratio 2.36).       08/15/2015 Initial Diagnosis    Malignant neoplasm of upper-outer quadrant of left breast in female, estrogen receptor positive ([place])     09/11/2015 - 09/11/2015 Neo-Adjuvant Chemotherapy    Carboplatin, docetaxel, trastuzumab, and pertuzumab x 1, stopped due to difficulty tolerating      09/11/2015 - 09/24/2016 Adjuvant Chemotherapy    Trastuzumab for a year      10/03/2015 - 10/17/2015 Neo-Adjuvant Chemotherapy    Abraxane, abraxane stopped after three cycles due to peripheral neuropathy      11/07/2015 - 01/09/2016 Neo-Adjuvant Chemotherapy    CMF x 4 cycles      02/14/2016 Surgery    Left lumpectomy: IDC, grade 2, 1.3 cm, repeat HER-2 negative, margins negative, 1/5 SLN +, ypT1c, N1a      05/19/2016 - 06/29/2016 Radiation Therapy    Adjuvant radiation Isidore Moos): 1) Left breast - 4 field Left breast: 50 Gy in 25 fractions SCLV/PAB: 45 Gy in 25 fractions 2) Left breast boost: 10 Gy in 5 fractions      07/2016 -  Anti-estrogen oral therapy    Anastrozole daily.  Bone density 06/24/2016 demonstrates T score of -0.7 (normal)       INTERVAL HISTORY:  Kristine Parrish presents to the [facility] today for our initial meeting to review her survivorship care plan detailing her treatment course for breast cancer, as well as monitoring long-term side effects of that treatment, education regarding health maintenance, screening, and overall wellness and health promotion.     Overall, Kristine Parrish reports feeling quite well.  She is taking Anastrozole daily  and tolerates it for the most part well.  She takes effexor for hot flashes she may experience and that seems to help.  She does have left foot pain where her bones have crumbled from diabetic neuropathy.  She cannot stand for long periods of time, or walk or exercise.  She doesn't know how to swim and is fearful of the water, she is interested in riding a stationary bike.      REVIEW OF SYSTEMS:  Review of Systems  Constitutional: Negative for appetite change, chills, fatigue, fever and unexpected weight change.  HENT:   Negative for hearing loss and lump/mass.   Eyes: Negative for eye problems and icterus.  Respiratory: Negative for chest tightness, cough and shortness of breath.   Cardiovascular: Negative for chest pain, leg swelling and palpitations.  Gastrointestinal: Negative for abdominal distention, abdominal pain, constipation, diarrhea, nausea and vomiting.  Endocrine: Positive for hot flashes.  Genitourinary: Negative for difficulty urinating.   Skin: Negative for itching and rash.  Neurological: Negative for dizziness, extremity weakness, headaches and numbness.  Psychiatric/Behavioral: Negative for depression. The patient is not nervous/anxious.   Breast: Denies any new nodularity, masses, tenderness, nipple changes, or nipple discharge.      ONCOLOGY TREATMENT TEAM:  1. Surgeon:  Dr. Marlou Starks at [facility] 2. Medical Oncologist: Dr. Jana Hakim  3. Radiation Oncologist: Dr. Isidore Moos    PAST MEDICAL/SURGICAL HISTORY:  Past Medical History:  Diagnosis Date  . Arthritis    "  hands "  . Breast cancer of upper-outer quadrant of left female breast ([place]) 08/15/2015  . Colon polyps   . Dyspnea    with exertion- "from chemo"  . GERD (gastroesophageal reflux disease)   . Hiatal hernia   . History of bronchitis   . History of radiation therapy 05/19/16- 06/29/16   Left Breast 50 Gy in 25 fractions, SCLV/PAB 45 Gy in 25 fractions, Left Breast boost 10 Gy in 5 fractions.     . Hypertension   . Neuropathy    from chemopathy  . Peptic ulcer disease   . Peripheral neuropathy   . Personal history of chemotherapy   . Personal history of radiation therapy   . Type 2 diabetes mellitus ([place])    Type II   Past Surgical History:  Procedure Laterality Date  . APPENDECTOMY  1974  . BREAST LUMPECTOMY Left 02/14/2016  . cataract surgery Bilateral 04/2012  . CHOLECYSTECTOMY  1996  . COLONOSCOPY    . FOOT SURGERY     left bunion removed  . nerve removed from left foot  2000  . PORTACATH PLACEMENT N/A 09/05/2015   Procedure: INSERTION PORT-A-CATH;  Surgeon: Autumn Messing III, MD;  Location: [facility];  Service: General;  Laterality: N/A;  . RADIOACTIVE SEED GUIDED PARTIAL MASTECTOMY/AXILLARY SENTINEL NODE BIOPSY/AXILLARY NODE DISSECTION Left 02/14/2016   Procedure: LEFT BREAST RADIOACTIVE SEED GUIDED LUMPECTOMY WITH LEFT  SENTINEL LYMPH NODE BIOPSY AND LEFT  SEED TARGETED DISSECTION;  Surgeon: Autumn Messing III, MD;  Location: [facility];  Service: General;  Laterality: Left;  . TUBAL LIGATION    . VAGINAL HYSTERECTOMY  1995     ALLERGIES:  Allergies  Allergen Reactions  . No Known Allergies      CURRENT MEDICATIONS:  Outpatient Encounter Prescriptions as of 12/28/2016  Medication Sig  . anastrozole (ARIMIDEX) 1 MG tablet Take 1 tablet (1 mg total) by mouth daily.  Marland Kitchen aspirin 81 MG tablet Take 81 mg by mouth daily.  Marland Kitchen gabapentin (NEURONTIN) 800 MG tablet Take 800 mg by mouth 3 (three) times daily.  Marland Kitchen lovastatin (MEVACOR) 20 MG tablet Take 20 mg by mouth at bedtime.  . metFORMIN (GLUCOPHAGE) 500 MG tablet Take 500 mg by mouth 2 (two) times daily with a meal.   . Multiple Vitamin (MULTIVITAMIN) tablet Take 1 tablet by mouth daily.  Marland Kitchen omeprazole (PRILOSEC) 20 MG capsule Take 20 mg by mouth daily.  . Probiotic Product (PROBIOTIC DAILY PO) Take 1 tablet by mouth daily. Reported on 08/21/2015  . spironolactone (ALDACTONE) 25 MG tablet Take 0.5 tablets (12.5 mg total) by mouth  daily.  Marland Kitchen venlafaxine XR (EFFEXOR-XR) 37.5 MG 24 hr capsule Take 1 capsule (37.5 mg total) by mouth daily with breakfast.  . losartan (COZAAR) 25 MG tablet Take 1 tablet (25 mg total) by mouth daily.  . [DISCONTINUED] traMADol (ULTRAM) 50 MG tablet Take 1 tablet (50 mg total) by mouth every 6 (six) hours as needed. (Patient not taking: Reported on 12/28/2016)   No facility-administered encounter medications on file as of 12/28/2016.      ONCOLOGIC FAMILY HISTORY:  Family History  Problem Relation Age of Onset  . Diabetes Mother   . Hypertension Mother   . Alcoholism Maternal Grandfather   . Congestive Heart Failure Maternal Grandmother      GENETIC COUNSELING/TESTING: Not at this time  SOCIAL HISTORY:  SERGIO HOBART is single and lives alone in [place], [place].  She has 2 kids who live in [place] and  [place], [place].  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  She denies any current or history of tobacco, alcohol, or illicit drug use.     PHYSICAL EXAMINATION:  Vital Signs:   Vitals:   12/28/16 1058  BP: (!) 153/76  Pulse: 89  Resp: 18  Temp: 98.4 F (36.9 C)  SpO2: 100%   Filed Weights   12/28/16 1058  Weight: 172 lb 8 oz (78.2 kg)   General: Well-nourished, well-appearing female in no acute distress.  She is unaccompanied today.   HEENT: Head is normocephalic.  Pupils equal and reactive to light. Conjunctivae clear without exudate.  Sclerae anicteric. Oral mucosa is pink, moist.  Oropharynx is pink without lesions or erythema.  Lymph: No cervical, supraclavicular, or infraclavicular lymphadenopathy noted on palpation.  Cardiovascular: Regular rate and rhythm.. Respiratory: Clear to auscultation bilaterally. Chest expansion symmetric; breathing non-labored.  GI: Abdomen soft and round; non-tender, non-distended. Bowel sounds normoactive.  GU: Deferred.  Neuro: No focal deficits. Steady gait.  Psych: Mood and affect normal and  appropriate for situation.  Extremities: No edema. MSK: No focal spinal tenderness to palpation.  Full range of motion in bilateral upper extremities Skin: Warm and dry.  LABORATORY DATA:  None for this visit.  DIAGNOSTIC IMAGING:  None for this visit.      ASSESSMENT AND PLAN:  Kristine Parrish is a pleasant 71 y.o. 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  She presents to the Survivorship Clinic for our initial meeting and routine follow-up post-completion of treatment for breast cancer.    1. Stage II left breast cancer:  Kristine Parrish is continuing to recover from definitive treatment for breast cancer. She will follow-up with her medical oncologist, Dr. Magrinat in 01/2017 with history and physical exam per surveillance protocol.  She will continue her anti-estrogen therapy with Anastrozole. Thus far, she is tolerating the Anastrozole well, with minimal side effects. Today, a comprehensive survivorship care plan and treatment summary was reviewed with the patient today detailing her breast cancer diagnosis, treatment course, potential late/long-term effects of treatment, appropriate follow-up care with recommendations for the future, and patient education resources.  A copy of this summary, along with a letter will be sent to the patient's primary care provider via mail/fax/In Basket message after today's visit.    2. Foot pain: Managed by podiatry, recommended activity at their discretion.    3. Bone health:  Given Kristine Parrish's age/history of breast cancer and her current treatment regimen including anti-estrogen therapy with Anastrozole, she is at risk for bone demineralization.  Her last DEXA scan was 06/24/2016, which showed a t score of -0.7  that was normal.  She will be due for repeat in 10/2018.  In the meantime, she was encouraged to increase her consumption of foods rich in calcium, as well as increase her weight-bearing activities.  She was given education on specific activities to promote bone health.  4. Cancer screening:  Due to Kristine Parrish history and her age, she should receive screening for skin cancers, colon cancer, and gynecologic cancers.  The information and recommendations are listed on the patient's comprehensive care plan/treatment summary and were reviewed in detail with the patient.    5. Health maintenance and wellness promotion: Kristine Parrish was encouraged to consume 5-7 servings of fruits and vegetables per   day. We reviewed the "Nutrition Rainbow" handout, as well as the handout "Take Control of Your Health and Reduce Your Cancer Risk" from the [employer].  She was also encouraged to engage in moderate to vigorous exercise for 30 minutes per day most days of the week. We discussed the [employer] fitness program, which is designed for cancer survivors to help them become more physically fit after cancer treatments.  She was instructed to limit her alcohol consumption and continue to abstain from tobacco use.     6. Support services/counseling: It is not uncommon for this period of the patient's cancer care trajectory to be one of many emotions and stressors.  We discussed an opportunity for her to participate in the next session of [facility] ("Finding Your New Normal") support group series designed for patients after they have completed treatment.   Kristine Parrish was encouraged to take advantage of our many other support services programs, support groups, and/or counseling in coping with her new life as a cancer survivor after completing anti-cancer treatment.  She was offered support today through active listening and expressive supportive counseling.  She was given information regarding our available services  and encouraged to contact me with any questions or for help enrolling in any of our support group/programs.    Dispo:   -Return to cancer center in 01/2017 for follow up with Dr. Magrinat  -Mammogram due in 10/2017 -Follow up with Dr. Toth in 03/2017 -Bone Density in 10/2018 -She is welcome to return back to the Survivorship Clinic at any time; no additional follow-up needed at this time.  -Consider referral back to survivorship as a long-term survivor for continued surveillance  A total of (30) minutes of face-to-face time was spent with this patient with greater than 50% of that time in counseling and care-coordination.    [place] , NP Survivorship Program [facility] [phone]   Note: PRIMARY CARE PROVIDER Meyers, Stephen, MD [phone]   

## 2017-01-04 ENCOUNTER — Ambulatory Visit: Payer: BLUE CROSS/BLUE SHIELD | Admitting: Oncology

## 2017-01-21 DIAGNOSIS — H5211 Myopia, right eye: Secondary | ICD-10-CM | POA: Diagnosis not present

## 2017-01-21 DIAGNOSIS — H1859 Other hereditary corneal dystrophies: Secondary | ICD-10-CM | POA: Diagnosis not present

## 2017-01-21 DIAGNOSIS — H524 Presbyopia: Secondary | ICD-10-CM | POA: Diagnosis not present

## 2017-01-21 DIAGNOSIS — H5202 Hypermetropia, left eye: Secondary | ICD-10-CM | POA: Diagnosis not present

## 2017-01-21 DIAGNOSIS — Z961 Presence of intraocular lens: Secondary | ICD-10-CM | POA: Diagnosis not present

## 2017-01-21 DIAGNOSIS — E119 Type 2 diabetes mellitus without complications: Secondary | ICD-10-CM | POA: Diagnosis not present

## 2017-01-22 ENCOUNTER — Other Ambulatory Visit: Payer: Self-pay | Admitting: Oncology

## 2017-01-27 DIAGNOSIS — M79671 Pain in right foot: Secondary | ICD-10-CM | POA: Diagnosis not present

## 2017-01-27 DIAGNOSIS — G8929 Other chronic pain: Secondary | ICD-10-CM | POA: Diagnosis not present

## 2017-01-27 DIAGNOSIS — M7671 Peroneal tendinitis, right leg: Secondary | ICD-10-CM | POA: Diagnosis not present

## 2017-01-27 DIAGNOSIS — E1142 Type 2 diabetes mellitus with diabetic polyneuropathy: Secondary | ICD-10-CM | POA: Diagnosis not present

## 2017-02-03 NOTE — Progress Notes (Signed)
[facility]  Telephone:(336) [phone] Fax:(336) [phone]     ID: Kristine Parrish DOB: [birth year]  MR#: [id number]  DUK#:[id number]  Patient Care Team: Orpah Melter, MD as PCP - General (Family Medicine) Jovita Kussmaul, MD as Consulting Physician (General Surgery) Magrinat, Virgie Dad, MD as Consulting Physician (Oncology) Eppie Gibson, MD as Attending Physician (Radiation Oncology) Murvin Donning, MD (Dermatology) Laurence Spates, MD as Consulting Physician (Gastroenterology) Rosemary Holms, DPM as Consulting Physician (Podiatry) Maisie Fus, MD as Consulting Physician (Obstetrics and Gynecology) Larey Dresser, MD as Consulting Physician (Cardiology) Wylene Simmer, MD as Consulting Physician (Orthopedic Surgery) PCP: Orpah Melter, MD OTHER MD:  CHIEF COMPLAINT: Estrogen receptor positive breast cancer  CURRENT TREATMENT: anastrozole   BREAST CANCER HISTORY: From the original intake note  Kristine Parrish herself noted a change in her left breast and brought it to the attention of Dr. Nori Riis, who set her up for bilateral diagnostic mammography with tomography and left breast ultrasonography at the [facility] 08/09/2015. The breast density was category D. In the area of palpable concern there was a mass with irregular margins and architectural distortion, measuring approximately 3 cm. This was palpable in the upper outer quadrant near the nipple. The ultrasound confirmed an irregular hypoechoic mass approximately 3 cm from the nipple at the 10:00 position measuring 2.7 cm. Ultrasound of the left axilla found a single level I left axillary node with focal cortical thickening.  On 08/13/2015 the patient underwent biopsy of the left breast mass and the suspicious left axillary lymph node, both showing invasive ductal carcinoma, grade 2 or 3, estrogen receptor 100% positive, progesterone receptor 100% positive, both with strong staining intensity, with an MIB-1 of 50%, and  HER-2 amplification, the signals ratio being 2.2 to and the number per cell 3.55  The patient's subsequent history is as detailed below  INTERVAL HISTORY: Kristine Parrish returns today for follow-up and treatment of her estrogen receptor positive breast cancer.  She completed her trastuzumab treatments in July.  She is currently on anastrozole alone, which she is tolerating well. She endorses mild, intermittent, arthralgias and myalgias. She is taking effexor to help with her hot flashes, which she rarely experiences now. She denies vaginal dryness.   REVIEW OF SYSTEMS: Kristine Parrish and her daughter have a tradition of shopping on thanksgiving evening and black Friday. She states she is doing better and is moving more, however, she is having pain to her right foot that radiates to her ankle. She is going for an MRI. The provider who is following her does not want to operate on her ankle because of her diabetes and fears infection causing her to lose her foot. For exercise she walks, but not as much as she would like because of the pain to her foot. Kristine Parrish also lifts weights at home. She denies unusual headaches, visual changes, nausea, vomiting, or dizziness. There has been no unusual cough, phlegm production, or pleurisy. This been no change in bowel or bladder habits. She denies unexplained fatigue or unexplained weight loss, bleeding, rash, or fever. A detailed review of systems was otherwise entirely stable.    PAST MEDICAL HISTORY: Past Medical History:  Diagnosis Date   Arthritis    "hands "   Breast cancer of upper-outer quadrant of left female breast ([facility]) 08/15/2015   Colon polyps    Dyspnea    with exertion- "from chemo"   GERD (gastroesophageal reflux disease)    Hiatal hernia    History of bronchitis  History of radiation therapy 05/19/16- 06/29/16   Left Breast 50 Gy in 25 fractions, SCLV/PAB 45 Gy in 25 fractions, Left Breast boost 10 Gy in 5 fractions.    Hypertension    Neuropathy     from chemopathy   Peptic ulcer disease    Peripheral neuropathy    Personal history of chemotherapy    Personal history of radiation therapy    Type 2 diabetes mellitus ([facility])    Type II    PAST SURGICAL HISTORY: Past Surgical History:  Procedure Laterality Date   APPENDECTOMY  1974   BREAST LUMPECTOMY Left 02/14/2016   cataract surgery Bilateral 04/2012   CHOLECYSTECTOMY  1996   COLONOSCOPY     FOOT SURGERY     left bunion removed   nerve removed from left foot  2000   PORTACATH PLACEMENT N/A 09/05/2015   Procedure: INSERTION PORT-A-CATH;  Surgeon: Autumn Messing III, MD;  Location: [facility];  Service: General;  Laterality: N/A;   RADIOACTIVE SEED GUIDED PARTIAL MASTECTOMY/AXILLARY SENTINEL NODE BIOPSY/AXILLARY NODE DISSECTION Left 02/14/2016   Procedure: LEFT BREAST RADIOACTIVE SEED GUIDED LUMPECTOMY WITH LEFT  SENTINEL LYMPH NODE BIOPSY AND LEFT  SEED TARGETED DISSECTION;  Surgeon: Autumn Messing III, MD;  Location: [facility] OR;  Service: General;  Laterality: Left;   TUBAL LIGATION     VAGINAL HYSTERECTOMY  1995    FAMILY HISTORY Family History  Problem Relation Age of Onset   Diabetes Mother    Hypertension Mother    Alcoholism Maternal Grandfather    Congestive Heart Failure Maternal Grandmother   The patient has little information about her father and is not sure of his cause of death or age at death. The patient's mother died at age 65. The patient had no siblings. She was raised by her grandmother. She is not aware of any breast or ovarian cancer history in the family   GYNECOLOGIC HISTORY:  No LMP recorded. Patient has had a hysterectomy.  menarche age 25, first live birth age 86. The patient is GX P2. She underwent hysterectomy with bilateral salpingo-oophorectomy in 1994, and has been on estrogen replacement since that time, discontinued June 2017.   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 She is divorced and lives alone,  with 2 cats. Daughter, Kristine Parrish lives in [place] and works as a Teacher, music for the [employer]. Son, Kristine Parrish lives in [place] where he is Secondary school teacher. The patient had one grandchild. She attends [employer]. The patient's daughter [name] is her healthcare power of attorney.     HEALTH MAINTENANCE: Social History   Tobacco Use   Smoking status: Never Smoker   Smokeless tobacco: Never Used  Substance Use Topics   Alcohol use: No   Drug use: No     Colonoscopy: 2012/[facility]  PAP:  Bone density: 2015/"normal" at Dr. Verlon Au  Lipid panel:  Allergies  Allergen Reactions   No Known Allergies     Current Outpatient Medications  Medication Sig Dispense Refill   anastrozole (ARIMIDEX) 1 MG tablet Take 1 tablet (1 mg total) by mouth daily. 90 tablet 4   aspirin 81 MG tablet Take 81 mg by mouth daily.     gabapentin (NEURONTIN) 800 MG tablet Take 800 mg by mouth 3 (three) times daily.     lovastatin (MEVACOR) 20 MG tablet Take 20 mg by mouth at bedtime.  metFORMIN (GLUCOPHAGE) 500 MG tablet Take 500 mg by mouth 2 (two) times daily with a meal.      Multiple Vitamin (MULTIVITAMIN) tablet Take 1 tablet by mouth daily.     omeprazole (PRILOSEC) 20 MG capsule Take 20 mg by mouth daily.     Probiotic Product (PROBIOTIC DAILY PO) Take 1 tablet by mouth daily. Reported on 08/21/2015     spironolactone (ALDACTONE) 25 MG tablet Take 0.5 tablets (12.5 mg total) by mouth daily. 45 tablet 3   venlafaxine XR (EFFEXOR-XR) 37.5 MG 24 hr capsule TAKE 1 CAPSULE (37.5 MG TOTAL) BY MOUTH DAILY WITH BREAKFAST. 30 capsule 4   losartan (COZAAR) 25 MG tablet Take 1 tablet (25 mg total) by mouth daily. 30 tablet 6   No current facility-administered medications for this visit.     OBJECTIVE: Middle-aged white woman who appears well  Vitals:   02/04/17 0937  BP: (!) 150/80  Pulse: (!) 113    Resp: 18  Temp: 98.1 F (36.7 C)  SpO2: 98%     Body mass index is 29.14 kg/m.    ECOG FS:1 - Symptomatic but completely ambulatory  Sclerae unicteric, EOMs intact Oropharynx clear and moist No cervical or supraclavicular adenopathy Lungs no rales or rhonchi Heart regular rate and rhythm Abd soft, nontender, positive bowel sounds MSK no focal spinal tenderness, no upper extremity lymphedema Neuro: nonfocal, well oriented, appropriate affect Breasts: The right breast is unremarkable. The left breast is s/p lumpectomy and radiaiton, with no evidence of recurrence. There is an apparent seroma in the left axila, unchanged  LAB RESULTS:  CMP     Component Value Date/Time   NA 140 12/28/2016 1043   K 4.1 12/28/2016 1043   CL 103 08/14/2016 1134   CO2 26 12/28/2016 1043   GLUCOSE 173 (H) 12/28/2016 1043   BUN 16.9 12/28/2016 1043   CREATININE 0.9 12/28/2016 1043   CALCIUM 9.3 12/28/2016 1043   PROT 7.0 12/28/2016 1043   ALBUMIN 3.6 12/28/2016 1043   AST 17 12/28/2016 1043   ALT 17 12/28/2016 1043   ALKPHOS 158 (H) 12/28/2016 1043   BILITOT 0.36 12/28/2016 1043   GFRNONAA >60 08/14/2016 1134   GFRAA >60 08/14/2016 1134    INo results found for: SPEP, UPEP  Lab Results  Component Value Date   WBC 5.8 12/28/2016   NEUTROABS 4.2 12/28/2016   HGB 10.9 (L) 12/28/2016   HCT 34.9 12/28/2016   MCV 86.6 12/28/2016   PLT 186 12/28/2016      Chemistry      Component Value Date/Time   NA 140 12/28/2016 1043   K 4.1 12/28/2016 1043   CL 103 08/14/2016 1134   CO2 26 12/28/2016 1043   BUN 16.9 12/28/2016 1043   CREATININE 0.9 12/28/2016 1043      Component Value Date/Time   CALCIUM 9.3 12/28/2016 1043   ALKPHOS 158 (H) 12/28/2016 1043   AST 17 12/28/2016 1043   ALT 17 12/28/2016 1043   BILITOT 0.36 12/28/2016 1043       No results found for: LABCA2  No components found for: LABCA125  No results for input(s): INR in the last 168 hours.  Urinalysis No results  found for: COLORURINE, APPEARANCEUR, LABSPEC, PHURINE, GLUCOSEU, HGBUR, BILIRUBINUR, KETONESUR, PROTEINUR, UROBILINOGEN, NITRITE, LEUKOCYTESUR  STUDIES: U/S Bilateral Breast TOMO, 10/20/2016, breast density category C, shows no evidence of malignancy.  ELIGIBLE FOR AVAILABLE RESEARCH PROTOCOL: Not eligible for PALLAS because HER-2 positive  ASSESSMENT: 71 y.o.  woman status  post left breast upper outer quadrant and left axillary lymph node biopsy 08/13/2015 both positive for an invasive ductal carcinoma, grade 2 or 3, triple positive, with an MIB-1 of 50%  (1) neoadjuvant chemotherapy consisting of carboplatin, docetaxel, trastuzumab and pertuzumab given every 21 days Started 09/11/2015  (a) treatment changed to Abraxane and trastuzumab with cycle 2 because of side effects after cycle 1  (b) Abraxane discontinued after 3d dose (10/17/2015) because of peripheral neuropathy  (c) cyclophosphamide, methotrexate and fluorouracil (CMF) started 11/07/2015, 4 cycles, last dose 01/09/2016   (2) trastuzumab continued to complete a year (last dose September 24, 2016)  (a) final echocardiogram 07/23/2016 showed a well-preserved ejection fraction at 55-60%  (3) status post left lumpectomy with TAD 02/14/2016 showing a residual pT1c pN1 invasive ductal carcinoma, grade 2, estrogen and progesterone receptor positive, now HER-2 not amplified; margins were negative  (a) left axillary seroma/abscess drained surgically 03/25/2016  (4) adjuvant radiation 05/19/16 - 06/29/16 Site/dose:    Left breast - 4 field             Left breast: 50 Gy in 25 fractions             SCLV/PAB: 45 Gy in 25 fractions Left breast boost: 10 GY in 5 fractions  (5) anastrozole started 07/07/2016  (a) bone density 06/24/2016 shows a T score of -0.7 (normal).  PLAN: Angelly is now just about a year out from definitive surgery for her breast cancer with no evidence o f disease recurrence. This is favorable.  She is tolerating  anastrozole without any unusual side effects. The plan is t continue that for a total of 5 years.  I am not sure what the exact cause of her right foot problems is. She is doing better with her current shoes and might do better yet with ankle support. She will continue to work with ortho regarding this.  Otherwise she will likely see Dr Marlou Starks later this year. She will see me again in March. She knows to call for any problems that may develop before that visit   Magrinat, Virgie Dad, MD  02/04/17 10:01 AM Medical Oncology and Hematology [facility] [place], [place] [address] Tel. [phone]    Fax. [phone]  This document serves as a record of services personally performed by Chauncey Cruel, MD. It was created on his behalf by Margit Banda, a trained medical scribe. The creation of this record is based on the scribe's personal observations and the provider's statements to them.   I have reviewed the above documentation for accuracy and completeness, and I agree with the above.

## 2017-02-04 ENCOUNTER — Ambulatory Visit (HOSPITAL_BASED_OUTPATIENT_CLINIC_OR_DEPARTMENT_OTHER): Payer: BLUE CROSS/BLUE SHIELD | Admitting: Oncology

## 2017-02-04 ENCOUNTER — Telehealth: Payer: Self-pay | Admitting: Oncology

## 2017-02-04 VITALS — BP 150/80 | HR 113 | Temp 98.1°F | Resp 18 | Ht 64.5 in | Wt 172.4 lb

## 2017-02-04 DIAGNOSIS — C50412 Malignant neoplasm of upper-outer quadrant of left female breast: Secondary | ICD-10-CM | POA: Diagnosis not present

## 2017-02-04 DIAGNOSIS — Z79811 Long term (current) use of aromatase inhibitors: Secondary | ICD-10-CM

## 2017-02-04 DIAGNOSIS — M79671 Pain in right foot: Secondary | ICD-10-CM | POA: Diagnosis not present

## 2017-02-04 DIAGNOSIS — E119 Type 2 diabetes mellitus without complications: Secondary | ICD-10-CM

## 2017-02-04 DIAGNOSIS — Z170 Estrogen receptor positive status [ER+]: Secondary | ICD-10-CM

## 2017-02-04 NOTE — Telephone Encounter (Signed)
Gave patient avs with calendar with appts per 11/29 los.

## 2017-02-05 DIAGNOSIS — M79671 Pain in right foot: Secondary | ICD-10-CM | POA: Diagnosis not present

## 2017-02-05 DIAGNOSIS — G8929 Other chronic pain: Secondary | ICD-10-CM | POA: Diagnosis not present

## 2017-02-19 DIAGNOSIS — M722 Plantar fascial fibromatosis: Secondary | ICD-10-CM | POA: Diagnosis not present

## 2017-02-19 DIAGNOSIS — M79671 Pain in right foot: Secondary | ICD-10-CM | POA: Diagnosis not present

## 2017-03-05 DIAGNOSIS — E1149 Type 2 diabetes mellitus with other diabetic neurological complication: Secondary | ICD-10-CM | POA: Diagnosis not present

## 2017-03-08 DIAGNOSIS — G8929 Other chronic pain: Secondary | ICD-10-CM | POA: Diagnosis not present

## 2017-03-08 DIAGNOSIS — M79671 Pain in right foot: Secondary | ICD-10-CM | POA: Diagnosis not present

## 2017-03-16 DIAGNOSIS — M79671 Pain in right foot: Secondary | ICD-10-CM | POA: Insufficient documentation

## 2017-03-18 DIAGNOSIS — M79671 Pain in right foot: Secondary | ICD-10-CM | POA: Diagnosis not present

## 2017-03-22 DIAGNOSIS — M79671 Pain in right foot: Secondary | ICD-10-CM | POA: Diagnosis not present

## 2017-03-25 DIAGNOSIS — M79671 Pain in right foot: Secondary | ICD-10-CM | POA: Diagnosis not present

## 2017-03-29 DIAGNOSIS — M79671 Pain in right foot: Secondary | ICD-10-CM | POA: Diagnosis not present

## 2017-04-02 DIAGNOSIS — M722 Plantar fascial fibromatosis: Secondary | ICD-10-CM | POA: Diagnosis not present

## 2017-04-02 DIAGNOSIS — M14671 Charcot's joint, right ankle and foot: Secondary | ICD-10-CM | POA: Diagnosis not present

## 2017-04-08 DIAGNOSIS — M79671 Pain in right foot: Secondary | ICD-10-CM | POA: Diagnosis not present

## 2017-04-09 DIAGNOSIS — C50412 Malignant neoplasm of upper-outer quadrant of left female breast: Secondary | ICD-10-CM | POA: Diagnosis not present

## 2017-04-12 DIAGNOSIS — M79671 Pain in right foot: Secondary | ICD-10-CM | POA: Diagnosis not present

## 2017-04-15 DIAGNOSIS — M79671 Pain in right foot: Secondary | ICD-10-CM | POA: Diagnosis not present

## 2017-04-30 DIAGNOSIS — M205X1 Other deformities of toe(s) (acquired), right foot: Secondary | ICD-10-CM | POA: Diagnosis not present

## 2017-04-30 DIAGNOSIS — M14671 Charcot's joint, right ankle and foot: Secondary | ICD-10-CM | POA: Diagnosis not present

## 2017-05-27 NOTE — Progress Notes (Signed)
[facility]  [phone] [phone]     ID: Kristine Parrish DOB: 1945/08/26  MR#: [id number]  UKG#:[id number]  Patient Care Team: Orpah Melter, MD as PCP - General (Family Medicine) Jovita Kussmaul, MD as Consulting Physician (General Surgery) Keyosha Tiedt, Virgie Dad, MD as Consulting Physician (Oncology) Eppie Gibson, MD as Attending Physician (Radiation Oncology) Murvin Donning, MD (Dermatology) Laurence Spates, MD as Consulting Physician (Gastroenterology) Rosemary Holms, DPM as Consulting Physician (Podiatry) Maisie Fus, MD as Consulting Physician (Obstetrics and Gynecology) Larey Dresser, MD as Consulting Physician (Cardiology) Wylene Simmer, MD as Consulting Physician (Orthopedic Surgery) PCP: Orpah Melter, MD OTHER MD:  CHIEF COMPLAINT: Estrogen receptor positive breast cancer  CURRENT TREATMENT: anastrozole   BREAST CANCER HISTORY: From the original intake note  Kristine Parrish herself noted a change in her left breast and brought it to the attention of Dr. Nori Riis, who set her up for bilateral diagnostic mammography with tomography and left breast ultrasonography at the [facility] 08/09/2015. The breast density was category D. In the area of palpable concern there was a mass with irregular margins and architectural distortion, measuring approximately 3 cm. This was palpable in the upper outer quadrant near the nipple. The ultrasound confirmed an irregular hypoechoic mass approximately 3 cm from the nipple at the 10:00 position measuring 2.7 cm. Ultrasound of the left axilla found a single level I left axillary node with focal cortical thickening.  On 08/13/2015 the patient underwent biopsy of the left breast mass and the suspicious left axillary lymph node, both showing invasive ductal carcinoma, grade 2 or 3, estrogen receptor 100% positive, progesterone receptor 100% positive, both with strong staining intensity, with an MIB-1 of 50%, and  HER-2 amplification, the signals ratio being 2.2 to and the number per cell 3.55  The patient's subsequent history is as detailed below  INTERVAL HISTORY: Kristine Parrish returns today for follow-up and treatment of her estrogen receptor positive breast cancer. She continues on anastrozole, with good tolerance. She denies having issues with hot flashes or vaginal dryness. She experiences pain in the third digit of her right hand, both shoulders, and lower back.  All this concerns are of course not so much as being possibly due to the medication, which is unlikely, but because she had a friend with "bone cancer" from breast cancer who died within 6 months of that diagnosis.   REVIEW OF SYSTEMS: Kristine Parrish reports that she has been gaining weight. She has been taking a new diabetes pill since January. 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 She is not able to walk for exercise anymore, but she is planning on using her new stationary bike at home. She is planning to go to [place], [place] in April with form friends, and she plans on using a boot. She went to [place] in 2018, and she walked very often there. She is now limited, and she does not like driving. She thinks that her Charcot Foot could be from a combination of bunion removal surgery along with her neuropathy. She is still working. She will be moving to a new house in [place] that her daughter and son-in-law are fixing for her. She had to euthanize one of her cats due to renal failure. She takes care of her other cat. She also lost a friend due to cancer recently in February 2019. She denies unusual headaches, visual changes, nausea, vomiting, or dizziness. There has been no unusual cough, phlegm production, or pleurisy. This  been no change in bowel or bladder habits. She denies unexplained fatigue or unexplained weight loss, bleeding, rash, or fever. A detailed review of systems was otherwise stable.    PAST MEDICAL  HISTORY: Past Medical History:  Diagnosis Date  . Arthritis    "hands "  . Breast cancer of upper-outer quadrant of left female breast ([facility]) 08/15/2015  . Colon polyps   . Dyspnea    with exertion- "from chemo"  . GERD (gastroesophageal reflux disease)   . Hiatal hernia   . History of bronchitis   . History of radiation therapy 05/19/16- 06/29/16   Left Breast 50 Gy in 25 fractions, SCLV/PAB 45 Gy in 25 fractions, Left Breast boost 10 Gy in 5 fractions.   . Hypertension   . Neuropathy    from chemopathy  . Peptic ulcer disease   . Peripheral neuropathy   . Personal history of chemotherapy   . Personal history of radiation therapy   . Type 2 diabetes mellitus ([facility])    Type II    PAST SURGICAL HISTORY: Past Surgical History:  Procedure Laterality Date  . APPENDECTOMY  1974  . BREAST LUMPECTOMY Left 02/14/2016  . cataract surgery Bilateral 04/2012  . CHOLECYSTECTOMY  1996  . COLONOSCOPY    . FOOT SURGERY     left bunion removed  . nerve removed from left foot  2000  . PORTACATH PLACEMENT N/A 09/05/2015   Procedure: INSERTION PORT-A-CATH;  Surgeon: Autumn Messing III, MD;  Location: [facility];  Service: General;  Laterality: N/A;  . RADIOACTIVE SEED GUIDED PARTIAL MASTECTOMY/AXILLARY SENTINEL NODE BIOPSY/AXILLARY NODE DISSECTION Left 02/14/2016   Procedure: LEFT BREAST RADIOACTIVE SEED GUIDED LUMPECTOMY WITH LEFT  SENTINEL LYMPH NODE BIOPSY AND LEFT  SEED TARGETED DISSECTION;  Surgeon: Autumn Messing III, MD;  Location: [facility];  Service: General;  Laterality: Left;  . TUBAL LIGATION    . VAGINAL HYSTERECTOMY  1995    FAMILY HISTORY Family History  Problem Relation Age of Onset  . Diabetes Mother   . Hypertension Mother   . Alcoholism Maternal Grandfather   . Congestive Heart Failure Maternal Grandmother   The patient has little information about her father and is not sure of his cause of death or age at death. The patient's mother died at age 43. The patient had no siblings. She was raised  by her grandmother. She is not aware of any breast or ovarian cancer history in the family   GYNECOLOGIC HISTORY:  No LMP recorded. Patient has had a hysterectomy.  menarche age 31, first live birth age 1. The patient is GX P2. She underwent hysterectomy with bilateral salpingo-oophorectomy in 1994, and has been on estrogen replacement since that time, discontinued June 2017.   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 Daughter, Kristine Parrish lives in [place] and works as a Teacher, music for the [employer]. Son, Kristine Parrish lives in [place] where he is Secondary school teacher. The patient had one grandchild. She attends [employer]. The patient's daughter [name] is her healthcare power of attorney.     HEALTH MAINTENANCE: Social History   Tobacco Use  . Smoking status: Never Smoker  . Smokeless tobacco: Never Used  Substance Use Topics  . Alcohol use: No  . Drug use: No     Colonoscopy: 2012/[facility]  PAP:  Bone density:  06/24/2016 shows a T score of -0.7 (normal)  Lipid panel:  Allergies  Allergen Reactions  . No Known Allergies     Current Outpatient Medications  Medication Sig Dispense Refill  . anastrozole (ARIMIDEX) 1 MG tablet Take 1 tablet (1 mg total) by mouth daily. 90 tablet 4  . aspirin 81 MG tablet Take 81 mg by mouth daily.    Marland Kitchen gabapentin (NEURONTIN) 800 MG tablet Take 800 mg by mouth 3 (three) times daily.    Marland Kitchen losartan (COZAAR) 25 MG tablet Take 1 tablet (25 mg total) by mouth daily. 30 tablet 6  . lovastatin (MEVACOR) 20 MG tablet Take 20 mg by mouth at bedtime.    . metFORMIN (GLUCOPHAGE) 500 MG tablet Take 500 mg by mouth 2 (two) times daily with a meal.     . Multiple Vitamin (MULTIVITAMIN) tablet Take 1 tablet by mouth daily.    Marland Kitchen omeprazole (PRILOSEC) 20 MG capsule Take 20 mg by mouth daily.      . Probiotic Product (PROBIOTIC DAILY PO) Take 1 tablet by mouth daily. Reported on 08/21/2015    . spironolactone (ALDACTONE) 25 MG tablet Take 0.5 tablets (12.5 mg total) by mouth daily. 45 tablet 3  . venlafaxine XR (EFFEXOR-XR) 37.5 MG 24 hr capsule TAKE 1 CAPSULE (37.5 MG TOTAL) BY MOUTH DAILY WITH BREAKFAST. 30 capsule 4   No current facility-administered medications for this visit.     OBJECTIVE: Middle-aged white woman who appears stated age  27:   06/01/17 1457  BP: 134/64  Pulse: (!) 106  Resp: 18  Temp: 97.7 F (36.5 C)  SpO2: 98%     Body mass index is 30.34 kg/m.    ECOG FS:1 - Symptomatic but completely ambulatory  Sclerae unicteric, pupils round and equal No cervical or supraclavicular adenopathy Lungs no rales or rhonchi Heart regular rate and rhythm Abd soft, nontender, positive bowel sounds MSK no focal spinal tenderness including lower back, no upper extremity lymphedema Neuro: nonfocal, well oriented, appropriate affect Breasts: The right breast is benign.  The left breast is status post lumpectomy followed by radiation.  There is no evidence of local recurrence.  The right axilla is benign.  In the left axilla there is a seroma measuring approximately 3 x 2 cm, previously noted and unchanged  LAB RESULTS:  CMP     Component Value Date/Time   NA 140 12/28/2016 1043   K 4.1 12/28/2016 1043   CL 103 08/14/2016 1134   CO2 26 12/28/2016 1043   GLUCOSE 173 (H) 12/28/2016 1043   BUN 16.9 12/28/2016 1043   CREATININE 0.9 12/28/2016 1043   CALCIUM 9.3 12/28/2016 1043   PROT 7.0 12/28/2016 1043   ALBUMIN 3.6 12/28/2016 1043   AST 17 12/28/2016 1043   ALT 17 12/28/2016 1043   ALKPHOS 158 (H) 12/28/2016 1043   BILITOT 0.36 12/28/2016 1043   GFRNONAA >60 08/14/2016 1134   GFRAA >60 08/14/2016 1134    INo results found for: SPEP, UPEP  Lab Results  Component Value Date   WBC 5.8 12/28/2016   NEUTROABS 4.2 12/28/2016   HGB 10.9 (L) 12/28/2016   HCT  34.9 12/28/2016   MCV 86.6 12/28/2016   PLT 186 12/28/2016      Chemistry      Component Value Date/Time   NA 140 12/28/2016 1043   K 4.1 12/28/2016 1043   CL 103 08/14/2016 1134   CO2 26 12/28/2016 1043   BUN 16.9 12/28/2016 1043   CREATININE  0.9 12/28/2016 1043      Component Value Date/Time   CALCIUM 9.3 12/28/2016 1043   ALKPHOS 158 (H) 12/28/2016 1043   AST 17 12/28/2016 1043   ALT 17 12/28/2016 1043   BILITOT 0.36 12/28/2016 1043       No results found for: LABCA2  No components found for: LABCA125  No results for input(s): INR in the last 168 hours.  Urinalysis No results found for: COLORURINE, APPEARANCEUR, LABSPEC, PHURINE, GLUCOSEU, HGBUR, BILIRUBINUR, KETONESUR, PROTEINUR, UROBILINOGEN, NITRITE, LEUKOCYTESUR  STUDIES: Next mammogram due August 2019  ELIGIBLE FOR AVAILABLE RESEARCH PROTOCOL: Not eligible for PALLAS because HER-2 positive  ASSESSMENT: 72 y.o. Kristine Parrish woman status post left breast upper outer quadrant and left axillary lymph node biopsy 08/13/2015 both positive for an invasive ductal carcinoma, grade 2 or 3, triple positive, with an MIB-1 of 50%  (1) neoadjuvant chemotherapy consisting of carboplatin, docetaxel, trastuzumab and pertuzumab given every 21 days Started 09/11/2015  (a) treatment changed to Abraxane and trastuzumab with cycle 2 because of side effects after cycle 1  (b) Abraxane discontinued after 3d dose (10/17/2015) because of peripheral neuropathy  (c) cyclophosphamide, methotrexate and fluorouracil (CMF) started 11/07/2015, 4 cycles, last dose 01/09/2016   (2) trastuzumab continued to complete a year (last dose September 24, 2016)  (a) final echocardiogram 07/23/2016 showed a well-preserved ejection fraction at 55-60%  (3) status post left lumpectomy with TAD 02/14/2016 showing a residual pT1c pN1 invasive ductal carcinoma, grade 2, estrogen and progesterone receptor positive, now HER-2 not amplified; margins were negative  (a)  left axillary seroma/abscess drained surgically 03/25/2016  (4) adjuvant radiation 05/19/16 - 06/29/16 Site/dose:    Left breast - 4 field             Left breast: 50 Gy in 25 fractions             SCLV/PAB: 45 Gy in 25 fractions Left breast boost: 10 GY in 5 fractions  (5) anastrozole started 07/07/2016  (a) bone density 06/24/2016 shows a T score of -0.7 (normal).  PLAN: Keymoni is now a year and a third out from definitive surgery for her breast cancer with no evidence of disease recurrence.  This is favorable.  I think many of her problems are really due to arthritis.  If she could exercise more I think she would feel better.  She is limited by her "Charcot foot" on the right.  Once she has a stationary bike she may be able to do more.  Until then I suggest that she use a cane to take some of the weight off the right foot.  Note that she is not a swimmer so swimming is not an option  I suggest that she participate in the live strong program and gave her the information on how to enroll.  We discussed diet issues.  She is going to try to avoid carbohydrates as much as possible  She will have her next mammogram in mid August.  She will see me late August.  She knows to call for any other issues that may develop before then.   Kanasia Gayman, Virgie Dad, MD  06/01/17 3:24 PM Medical Oncology and Hematology [facility] [address] [place], [place] [address] Tel. [phone]    Fax. [phone]  This document serves as a record of services personally performed by Lurline Del, MD. It was created on his behalf by Sheron Nightingale, a trained medical scribe. The creation of this record is based on the  scribe's personal observations and the provider's statements to them.   I have reviewed the above documentation for accuracy and completeness, and I agree with the above.

## 2017-06-01 ENCOUNTER — Telehealth: Payer: Self-pay | Admitting: Oncology

## 2017-06-01 ENCOUNTER — Inpatient Hospital Stay: Payer: BLUE CROSS/BLUE SHIELD | Attending: Oncology | Admitting: Oncology

## 2017-06-01 VITALS — BP 134/64 | HR 106 | Temp 97.7°F | Resp 18 | Ht 64.5 in | Wt 179.5 lb

## 2017-06-01 DIAGNOSIS — Z170 Estrogen receptor positive status [ER+]: Secondary | ICD-10-CM | POA: Insufficient documentation

## 2017-06-01 DIAGNOSIS — M14671 Charcot's joint, right ankle and foot: Secondary | ICD-10-CM | POA: Insufficient documentation

## 2017-06-01 DIAGNOSIS — M1990 Unspecified osteoarthritis, unspecified site: Secondary | ICD-10-CM | POA: Insufficient documentation

## 2017-06-01 DIAGNOSIS — C50412 Malignant neoplasm of upper-outer quadrant of left female breast: Secondary | ICD-10-CM | POA: Insufficient documentation

## 2017-06-01 DIAGNOSIS — Z79811 Long term (current) use of aromatase inhibitors: Secondary | ICD-10-CM | POA: Diagnosis not present

## 2017-06-01 NOTE — Telephone Encounter (Signed)
Gave avs and calendar ° °

## 2017-06-03 ENCOUNTER — Ambulatory Visit: Payer: BLUE CROSS/BLUE SHIELD | Admitting: Oncology

## 2017-06-03 DIAGNOSIS — E1149 Type 2 diabetes mellitus with other diabetic neurological complication: Secondary | ICD-10-CM | POA: Diagnosis not present

## 2017-06-03 DIAGNOSIS — E11610 Type 2 diabetes mellitus with diabetic neuropathic arthropathy: Secondary | ICD-10-CM | POA: Diagnosis not present

## 2017-06-03 DIAGNOSIS — I10 Essential (primary) hypertension: Secondary | ICD-10-CM | POA: Diagnosis not present

## 2017-06-03 DIAGNOSIS — G629 Polyneuropathy, unspecified: Secondary | ICD-10-CM | POA: Diagnosis not present

## 2017-06-07 ENCOUNTER — Other Ambulatory Visit: Payer: Self-pay | Admitting: Oncology

## 2017-07-09 ENCOUNTER — Other Ambulatory Visit: Payer: Self-pay | Admitting: Oncology

## 2017-09-07 DIAGNOSIS — E1149 Type 2 diabetes mellitus with other diabetic neurological complication: Secondary | ICD-10-CM | POA: Diagnosis not present

## 2017-09-15 DIAGNOSIS — M79671 Pain in right foot: Secondary | ICD-10-CM | POA: Diagnosis not present

## 2017-09-15 DIAGNOSIS — M14671 Charcot's joint, right ankle and foot: Secondary | ICD-10-CM | POA: Diagnosis not present

## 2017-10-12 ENCOUNTER — Other Ambulatory Visit: Payer: Self-pay | Admitting: Oncology

## 2017-10-12 DIAGNOSIS — Z9889 Other specified postprocedural states: Secondary | ICD-10-CM

## 2017-10-22 ENCOUNTER — Ambulatory Visit
Admission: RE | Admit: 2017-10-22 | Discharge: 2017-10-22 | Disposition: A | Discharge status: Home / Self Care | Payer: BLUE CROSS/BLUE SHIELD | Source: Ambulatory Visit | Attending: Oncology | Admitting: Oncology

## 2017-10-22 DIAGNOSIS — R922 Inconclusive mammogram: Secondary | ICD-10-CM | POA: Diagnosis not present

## 2017-10-22 DIAGNOSIS — Z9889 Other specified postprocedural states: Secondary | ICD-10-CM

## 2017-11-03 NOTE — Progress Notes (Signed)
[place]  Telephone:([phone]) [phone] Fax:(336) [phone]     ID: HENRETTA QUIST DOB: [birth year]  MR#: [id number]  EXN#:[id number]  Patient Care Team: Orpah Melter, MD as PCP - General (Family Medicine) Jovita Kussmaul, MD as Consulting Physician (General Surgery) Inanna Telford, Virgie Dad, MD as Consulting Physician (Oncology) Eppie Gibson, MD as Attending Physician (Radiation Oncology) Murvin Donning, MD (Dermatology) Laurence Spates, MD as Consulting Physician (Gastroenterology) Rosemary Holms, DPM as Consulting Physician (Podiatry) Maisie Fus, MD as Consulting Physician (Obstetrics and Gynecology) Larey Dresser, MD as Consulting Physician (Cardiology) Wylene Simmer, MD as Consulting Physician (Orthopedic Surgery) PCP: Orpah Melter, MD OTHER MD:  CHIEF COMPLAINT: Estrogen receptor positive breast cancer  CURRENT TREATMENT: anastrozole   BREAST CANCER HISTORY: From the original intake note  Vylet herself noted a change in her left breast and brought it to the attention of Dr. Nori Riis, who set her up for bilateral diagnostic mammography with tomography and left breast ultrasonography at the [facility] 08/09/2015. The breast density was category D. In the area of palpable concern there was a mass with irregular margins and architectural distortion, measuring approximately 3 cm. This was palpable in the upper outer quadrant near the nipple. The ultrasound confirmed an irregular hypoechoic mass approximately 3 cm from the nipple at the 10:00 position measuring 2.7 cm. Ultrasound of the left axilla found a single level I left axillary node with focal cortical thickening.  On 08/13/2015 the patient underwent biopsy of the left breast mass and the suspicious left axillary lymph node, both showing invasive ductal carcinoma, grade 2 or 3, estrogen receptor 100% positive, progesterone receptor 100% positive, both with strong staining intensity, with an MIB-1 of 50%, and  HER-2 amplification, the signals ratio being 2.2 to and the number per cell 3.55  The patient's subsequent history is as detailed below  INTERVAL HISTORY: Tymara returns today for follow-up and treatment of her estrogen receptor positive breast cancer. She continues on anastrozole, with good tolerance. She has occasional hot flashes. She takes venlafaxine, which helps with the intensity. She has increased arthralgias and myalgias. She takes gabapentin and tylenol for pain. She denies issues with vaginal dryness.   Since her last visit, she underwent diagnostic bilateral mammography with CAD and tomography on 10/22/2017 at [facility] showing: breast density category C. There was no evidence of malignancy.    REVIEW OF SYSTEMS: Halsey is planning on seeing a nutritionist to control her sugars and weight. She is able to get on a stationary bike for exercise. but she is trying to be careful for her foot issues. Her PCP suggested that she visit a pain management clinic. She is concerned about becoming dependant on pain medication.  She denies unusual headaches, visual changes, nausea, vomiting, or dizziness. There has been no unusual cough, phlegm production, or pleurisy. There has been no change in bowel or bladder habits. She denies unexplained fatigue or unexplained weight loss, bleeding, rash, or fever. A detailed review of systems was otherwise stable.     PAST MEDICAL HISTORY: Past Medical History:  Diagnosis Date  . Arthritis    "hands "  . Breast cancer of upper-outer quadrant of left female breast ([place]) 08/15/2015  . Colon polyps   . Dyspnea    with exertion- "from chemo"  . GERD (gastroesophageal reflux disease)   . Hiatal hernia   . History of bronchitis   . History of radiation therapy 05/19/16- 06/29/16   Left Breast 50 Gy in 25  fractions, SCLV/PAB 45 Gy in 25 fractions, Left Breast boost 10 Gy in 5 fractions.   . Hypertension   . Neuropathy    from chemopathy  . Peptic ulcer  disease   . Peripheral neuropathy   . Personal history of chemotherapy   . Personal history of radiation therapy   . Type 2 diabetes mellitus ([place])    Type II    PAST SURGICAL HISTORY: Past Surgical History:  Procedure Laterality Date  . APPENDECTOMY  1974  . BREAST LUMPECTOMY Left 02/14/2016  . cataract surgery Bilateral 04/2012  . CHOLECYSTECTOMY  1996  . COLONOSCOPY    . FOOT SURGERY     left bunion removed  . nerve removed from left foot  2000  . PORTACATH PLACEMENT N/A 09/05/2015   Procedure: INSERTION PORT-A-CATH;  Surgeon: Autumn Messing III, MD;  Location: [facility];  Service: General;  Laterality: N/A;  . RADIOACTIVE SEED GUIDED PARTIAL MASTECTOMY/AXILLARY SENTINEL NODE BIOPSY/AXILLARY NODE DISSECTION Left 02/14/2016   Procedure: LEFT BREAST RADIOACTIVE SEED GUIDED LUMPECTOMY WITH LEFT  SENTINEL LYMPH NODE BIOPSY AND LEFT  SEED TARGETED DISSECTION;  Surgeon: Autumn Messing III, MD;  Location: [facility];  Service: General;  Laterality: Left;  . TUBAL LIGATION    . VAGINAL HYSTERECTOMY  1995    FAMILY HISTORY Family History  Problem Relation Age of Onset  . Diabetes Mother   . Hypertension Mother   . Alcoholism Maternal Grandfather   . Congestive Heart Failure Maternal Grandmother   The patient has little information about her father and is not sure of his cause of death or age at death. The patient's mother died at age 73. The patient had no siblings. She was raised by her grandmother. She is not aware of any breast or ovarian cancer history in the family   GYNECOLOGIC HISTORY:  No LMP recorded. Patient has had a hysterectomy.  menarche age 72, first live birth age 72. The patient is GX P2. She underwent hysterectomy with bilateral salpingo-oophorectomy in 1994, and has been on estrogen replacement since that time, discontinued June 2017.   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  She is considering retirement in late 2019.  She is divorced and  lives alone, with 1 cat. Her other cat died of kidney issues. Daughter, [name] lives in [place] and works as a Teacher, music for the [employer]. Son, Teresia Myint lives in [place] where he is Secondary school teacher. The patient has one granddaughter, 35 years old as of August 2019.  She attends [employer]. The patient's daughter [name] is her healthcare power of attorney.     HEALTH MAINTENANCE: Social History   Tobacco Use  . Smoking status: Never Smoker  . Smokeless tobacco: Never Used  Substance Use Topics  . Alcohol use: No  . Drug use: No     Colonoscopy: 2012/[facility]  PAP:  Bone density:  06/24/2016 shows a T score of -0.7 (normal)  Lipid panel:  Allergies  Allergen Reactions  . No Known Allergies     Current Outpatient Medications  Medication Sig Dispense Refill  . anastrozole (ARIMIDEX) 1 MG tablet TAKE 1 TABLET (1 MG TOTAL) BY MOUTH DAILY. 90 tablet 1  . aspirin 81 MG tablet Take 81 mg by mouth daily.    Marland Kitchen gabapentin (NEURONTIN) 800 MG tablet Take 800 mg by mouth 3 (three) times daily.    Marland Kitchen  losartan (COZAAR) 25 MG tablet Take 1 tablet (25 mg total) by mouth daily. 30 tablet 6  . lovastatin (MEVACOR) 20 MG tablet Take 20 mg by mouth at bedtime.    . metFORMIN (GLUCOPHAGE) 500 MG tablet Take 500 mg by mouth 2 (two) times daily with a meal.     . Multiple Vitamin (MULTIVITAMIN) tablet Take 1 tablet by mouth daily.    Marland Kitchen omeprazole (PRILOSEC) 20 MG capsule Take 20 mg by mouth daily.    . Probiotic Product (PROBIOTIC DAILY PO) Take 1 tablet by mouth daily. Reported on 08/21/2015    . spironolactone (ALDACTONE) 25 MG tablet Take 0.5 tablets (12.5 mg total) by mouth daily. 45 tablet 3  . venlafaxine XR (EFFEXOR-XR) 37.5 MG 24 hr capsule TAKE 1 CAPSULE (37.5 MG TOTAL) BY MOUTH DAILY WITH BREAKFAST. 90 capsule 1   No current facility-administered medications for this visit.     OBJECTIVE:  Middle-aged white woman   Vitals:   11/04/17 1453  BP: 135/68  Pulse: 84  Resp: 16  Temp: 97.9 F (36.6 C)  SpO2: 98%     Body mass index is 30.93 kg/m.    ECOG FS:1 - Symptomatic but completely ambulatory  Sclerae unicteric, EOMs intact No cervical or supraclavicular adenopathy Lungs no rales or rhonchi Heart regular rate and rhythm Abd soft, nontender, positive bowel sounds MSK no focal spinal tenderness, right foot and ankle edema, without erythema Neuro: nonfocal, well oriented, depressed affect Breasts: The right breast is unremarkable.  The left breast is status post lumpectomy and radiation.  There is no evidence of local recurrence.  In the left axilla there continues to be a seroma measuring approximately 2 cm.  This is unchanged.  The right axilla is benign.  LAB RESULTS:  CMP     Component Value Date/Time   NA 140 12/28/2016 1043   K 4.1 12/28/2016 1043   CL 103 08/14/2016 1134   CO2 26 12/28/2016 1043   GLUCOSE 173 (H) 12/28/2016 1043   BUN 16.9 12/28/2016 1043   CREATININE 0.9 12/28/2016 1043   CALCIUM 9.3 12/28/2016 1043   PROT 7.0 12/28/2016 1043   ALBUMIN 3.6 12/28/2016 1043   AST 17 12/28/2016 1043   ALT 17 12/28/2016 1043   ALKPHOS 158 (H) 12/28/2016 1043   BILITOT 0.36 12/28/2016 1043   GFRNONAA >60 08/14/2016 1134   GFRAA >60 08/14/2016 1134    INo results found for: SPEP, UPEP  Lab Results  Component Value Date   WBC 7.0 11/04/2017   NEUTROABS 5.0 11/04/2017   HGB 9.6 (L) 11/04/2017   HCT 32.4 (L) 11/04/2017   MCV 82.2 11/04/2017   PLT 216 11/04/2017      Chemistry      Component Value Date/Time   NA 140 12/28/2016 1043   K 4.1 12/28/2016 1043   CL 103 08/14/2016 1134   CO2 26 12/28/2016 1043   BUN 16.9 12/28/2016 1043   CREATININE 0.9 12/28/2016 1043      Component Value Date/Time   CALCIUM 9.3 12/28/2016 1043   ALKPHOS 158 (H) 12/28/2016 1043   AST 17 12/28/2016 1043   ALT 17 12/28/2016 1043   BILITOT 0.36 12/28/2016 1043         No results found for: LABCA2  No components found for: LABCA125  No results for input(s): INR in the last 168 hours.  Urinalysis No results found for: COLORURINE, APPEARANCEUR, LABSPEC, PHURINE, GLUCOSEU, HGBUR, BILIRUBINUR, KETONESUR, PROTEINUR, UROBILINOGEN, NITRITE, LEUKOCYTESUR  STUDIES: Mm Diag  Breast Tomo Bilateral  Result Date: 10/22/2017 CLINICAL DATA:  72 year old female status post malignant left lumpectomy with chemotherapy and radiation. Patient has a left axillary seroma which has been drained multiple times. No current symptoms. EXAM: DIGITAL DIAGNOSTIC BILATERAL MAMMOGRAM WITH CAD AND TOMO COMPARISON:  Previous exam(s). ACR Breast Density Category c: The breast tissue is heterogeneously dense, which may obscure small masses. FINDINGS: Stable postsurgical and post radiation changes are noted on the left. A left axillary seroma is again noted. No new or suspicious mammographic findings are identified in either breast. Mammographic images were processed with CAD. IMPRESSION: 1. No mammographic evidence of malignancy. 2. Stable posttreatment changes in the left breast. 3. Stable left axillary postoperative seroma. RECOMMENDATION: Diagnostic mammogram is suggested in 1 year. (Code:[id number]-01Y) I have discussed the findings and recommendations with the patient. Results were also provided in writing at the conclusion of the visit. If applicable, a reminder letter will be sent to the patient regarding the next appointment. BI-RADS CATEGORY  2: Benign. Electronically Signed   By: Kristopher Oppenheim M.D.   On: 10/22/2017 16:22     ELIGIBLE FOR AVAILABLE RESEARCH PROTOCOL: Not eligible for PALLAS because HER-2 positive  ASSESSMENT: 72 y.o. [place] woman status post left breast upper outer quadrant and left axillary lymph node biopsy 08/13/2015 both positive for an invasive ductal carcinoma, grade 2 or 3, triple positive, with an MIB-1 of 50%  (1) neoadjuvant chemotherapy consisting of  carboplatin, docetaxel, trastuzumab and pertuzumab given every 21 days Started 09/11/2015  (a) treatment changed to Abraxane and trastuzumab with cycle 2 because of side effects after cycle 1  (b) Abraxane discontinued after 3d dose (10/17/2015) because of peripheral neuropathy  (c) cyclophosphamide, methotrexate and fluorouracil (CMF) started 11/07/2015, 4 cycles, last dose 01/09/2016   (2) trastuzumab continued to complete a year (last dose September 24, 2016)  (a) final echocardiogram 07/23/2016 showed a well-preserved ejection fraction at 55-60%  (3) status post left lumpectomy with targeted axillary dissection 02/14/2016 showing a residual pT1c pN1 invasive ductal carcinoma, grade 2, estrogen and progesterone receptor positive, now HER-2 not amplified; margins were negative  (a) left axillary seroma/abscess drained surgically 03/25/2016  (4) adjuvant radiation 05/19/16 - 06/29/16 Site/dose:    Left breast - 4 field             Left breast: 50 Gy in 25 fractions             SCLV/PAB: 45 Gy in 25 fractions Left breast boost: 10 GY in 5 fractions  (5) anastrozole started 07/07/2016  (a) bone density 06/24/2016 shows a T score of -0.7 (normal).  PLAN: Cynda will soon be 2 years out from definitive surgery for her breast cancer with no evidence of disease recurrence.  This is favorable.  She is tolerating anastrozole well and the line is to continue that a minimum of 5 years.  She has a normal bone density which will need to be repeated sometime in 2020.  Biggest problem is chronic pain from her right foot problems.  I gave her information on Dr. Morey [facility]'s clinic in [place] if she wishes to follow-up on that.  Otherwise she will return to see me in 1 year.  She knows to call for any other problems that may develop before that visit.  Michella Detjen, Virgie Dad, MD  11/04/17 3:16 PM Medical Oncology and Hematology [facility] [address] [place], [place]  [address] Tel. [phone]    Fax. [phone]  Alice Rieger,  am acting as scribe for Chauncey Cruel MD.  I, Lurline Del MD, have reviewed the above documentation for accuracy and completeness, and I agree with the above.

## 2017-11-04 ENCOUNTER — Inpatient Hospital Stay: Payer: BLUE CROSS/BLUE SHIELD

## 2017-11-04 ENCOUNTER — Inpatient Hospital Stay: Payer: BLUE CROSS/BLUE SHIELD | Attending: Oncology | Admitting: Oncology

## 2017-11-04 ENCOUNTER — Telehealth: Payer: Self-pay | Admitting: Oncology

## 2017-11-04 VITALS — BP 135/68 | HR 84 | Temp 97.9°F | Resp 16 | Ht 64.5 in | Wt 183.0 lb

## 2017-11-04 DIAGNOSIS — Z170 Estrogen receptor positive status [ER+]: Secondary | ICD-10-CM

## 2017-11-04 DIAGNOSIS — E7800 Pure hypercholesterolemia, unspecified: Secondary | ICD-10-CM | POA: Diagnosis not present

## 2017-11-04 DIAGNOSIS — E119 Type 2 diabetes mellitus without complications: Secondary | ICD-10-CM | POA: Insufficient documentation

## 2017-11-04 DIAGNOSIS — M79671 Pain in right foot: Secondary | ICD-10-CM | POA: Insufficient documentation

## 2017-11-04 DIAGNOSIS — Z79811 Long term (current) use of aromatase inhibitors: Secondary | ICD-10-CM | POA: Diagnosis not present

## 2017-11-04 DIAGNOSIS — C773 Secondary and unspecified malignant neoplasm of axilla and upper limb lymph nodes: Secondary | ICD-10-CM | POA: Insufficient documentation

## 2017-11-04 DIAGNOSIS — I10 Essential (primary) hypertension: Secondary | ICD-10-CM | POA: Diagnosis not present

## 2017-11-04 DIAGNOSIS — C50412 Malignant neoplasm of upper-outer quadrant of left female breast: Secondary | ICD-10-CM | POA: Insufficient documentation

## 2017-11-04 DIAGNOSIS — G629 Polyneuropathy, unspecified: Secondary | ICD-10-CM | POA: Diagnosis not present

## 2017-11-04 DIAGNOSIS — E1149 Type 2 diabetes mellitus with other diabetic neurological complication: Secondary | ICD-10-CM | POA: Diagnosis not present

## 2017-11-04 DIAGNOSIS — G8929 Other chronic pain: Secondary | ICD-10-CM | POA: Diagnosis not present

## 2017-11-04 DIAGNOSIS — E11610 Type 2 diabetes mellitus with diabetic neuropathic arthropathy: Secondary | ICD-10-CM | POA: Diagnosis not present

## 2017-11-04 LAB — CBC WITH DIFFERENTIAL/PLATELET
Basophils Absolute: 0 10*3/uL (ref 0.0–0.1)
Basophils Relative: 0 %
Eosinophils Absolute: 0.2 10*3/uL (ref 0.0–0.5)
Eosinophils Relative: 3 %
HCT: 32.4 % — ABNORMAL LOW (ref 34.8–46.6)
Hemoglobin: 9.6 g/dL — ABNORMAL LOW (ref 11.6–15.9)
Lymphocytes Relative: 18 %
Lymphs Abs: 1.2 10*3/uL (ref 0.9–3.3)
MCH: 24.4 pg — ABNORMAL LOW (ref 25.1–34.0)
MCHC: 29.6 g/dL — ABNORMAL LOW (ref 31.5–36.0)
MCV: 82.2 fL (ref 79.5–101.0)
Monocytes Absolute: 0.5 10*3/uL (ref 0.1–0.9)
Monocytes Relative: 7 %
Neutro Abs: 5 10*3/uL (ref 1.5–6.5)
Neutrophils Relative %: 72 %
Platelets: 216 10*3/uL (ref 145–400)
RBC: 3.94 MIL/uL (ref 3.70–5.45)
RDW: 15.8 % — ABNORMAL HIGH (ref 11.2–14.5)
WBC: 7 10*3/uL (ref 3.9–10.3)

## 2017-11-04 LAB — COMPREHENSIVE METABOLIC PANEL
ALT: 17 U/L (ref 0–44)
AST: 14 U/L — ABNORMAL LOW (ref 15–41)
Albumin: 3.6 g/dL (ref 3.5–5.0)
Alkaline Phosphatase: 162 U/L — ABNORMAL HIGH (ref 38–126)
Anion gap: 10 (ref 5–15)
BUN: 11 mg/dL (ref 8–23)
CO2: 25 mmol/L (ref 22–32)
Calcium: 9.5 mg/dL (ref 8.9–10.3)
Chloride: 105 mmol/L (ref 98–111)
Creatinine, Ser: 0.82 mg/dL (ref 0.44–1.00)
GFR calc Af Amer: >60 mL/min (ref >60)
GFR calc non Af Amer: >60 mL/min (ref >60)
Glucose, Bld: 203 mg/dL — ABNORMAL HIGH (ref 70–99)
Potassium: 3.6 mmol/L (ref 3.5–5.1)
Sodium: 140 mmol/L (ref 135–145)
Total Bilirubin: 0.2 mg/dL — ABNORMAL LOW (ref 0.3–1.2)
Total Protein: 7 g/dL (ref 6.5–8.1)

## 2017-11-04 MED ORDER — ANASTROZOLE 1 MG PO TABS: 1.0000 mg | ORAL_TABLET | Freq: Every day | ORAL | 1 refills | Status: DC

## 2017-11-04 MED ORDER — VENLAFAXINE HCL ER 37.5 MG PO CP24: 37.5000 mg | ORAL_CAPSULE | Freq: Every day | ORAL | 1 refills | Status: DC

## 2017-11-04 NOTE — Telephone Encounter (Signed)
Gave avs and calendar ° °

## 2017-11-25 DIAGNOSIS — C50412 Malignant neoplasm of upper-outer quadrant of left female breast: Secondary | ICD-10-CM | POA: Diagnosis not present

## 2017-11-30 DIAGNOSIS — M14671 Charcot's joint, right ankle and foot: Secondary | ICD-10-CM | POA: Diagnosis not present

## 2017-12-01 ENCOUNTER — Encounter: Payer: BLUE CROSS/BLUE SHIELD | Attending: Family Medicine | Admitting: *Deleted

## 2017-12-01 DIAGNOSIS — Z713 Dietary counseling and surveillance: Secondary | ICD-10-CM | POA: Diagnosis not present

## 2017-12-01 DIAGNOSIS — E1149 Type 2 diabetes mellitus with other diabetic neurological complication: Secondary | ICD-10-CM | POA: Insufficient documentation

## 2017-12-01 DIAGNOSIS — E119 Type 2 diabetes mellitus without complications: Secondary | ICD-10-CM

## 2017-12-01 NOTE — Patient Instructions (Signed)
Plan:  Aim for at least 2 Carb Choices per meal (30 grams)   Aim for 0-1 Carbs per snack if hungry  Include protein in moderation with your meals and snacks Continue reading food labels for Total Carbohydrate of foods Consider  increasing your activity level with Arm Chair Exercises or Recumbent Bike  as tolerated Consider checking BG at alternate times per day   Continue taking medication as directed by MD

## 2017-12-01 NOTE — Progress Notes (Signed)
Diabetes Self-Management Education  Visit Type: First/Initial  Appt. Start Time: 0930 Appt. End Time: 1100  12/01/2017  Ms. Kristine Parrish, identified by name and date of birth, is a 72 y.o. female with a diagnosis of Diabetes: Type 2. She states she had good success managing her diabetes until she had foot surgery and then cancer. 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 She is knowledgeable of good nutrition for diabetes but would like suggestions of what to prepare for one person that won't bet boring. She would also like some exercise ideas.  ASSESSMENT  There were no vitals taken for this visit. There is no height or weight on file to calculate BMI.  Diabetes Self-Management Education - 12/01/17 0935      Visit Information   Visit Type  First/Initial      Initial Visit   Diabetes Type  Type 2    Are you currently following a meal plan?  Yes    What type of meal plan do you follow?  tries to eat healthy    Are you taking your medications as prescribed?  Yes    Date Diagnosed  9 years ago      Health Coping   How would you rate your overall health?  Fair      Psychosocial Assessment   Patient Belief/Attitude about Diabetes  Motivated to manage diabetes    Self-care barriers  Debilitated state due to current medical condition   history of foot surgery, cancer and now Charkot's Foot   Self-management support  Friends    Other persons present  Patient    Patient Concerns  Glycemic Control    Special Needs  None    Learning Readiness  Change in progress    How often do you need to have someone help you when you read instructions, pamphlets, or other written materials from your doctor or pharmacy?  1 - Never    What is the last grade level you completed in school?  12      Pre-Education Assessment   Patient understands the diabetes  disease and treatment process.  Demonstrates understanding / competency    Patient understands incorporating nutritional management into lifestyle.  Demonstrates understanding / competency    Patient undertands incorporating physical activity into lifestyle.  Demonstrates understanding / competency    Patient understands using medications safely.  Needs Review    Patient understands monitoring blood glucose, interpreting and using results  Needs Review    Patient understands prevention, detection, and treatment of acute complications.  Demonstrates understanding / competency    Patient understands prevention, detection, and treatment of chronic complications.  Demonstrates understanding / competency    Patient understands how to develop strategies to address psychosocial issues.  Demonstrates understanding / competency    Patient understands how to develop strategies to promote health/change behavior.  Demonstrates understanding / competency      Complications   Last HgB A1C per patient/outside source  7.9 %    How often do you check your blood sugar?  3-4 times / week    Number of hypoglycemic episodes per month  0.5    Can you tell when your blood sugar is low?  Yes    Have you had a dilated eye exam in the past 12 months?  Yes    Have  you had a dental exam in the past 12 months?  Yes    Are you checking your feet?  Yes    How many days per week are you checking your feet?  7      Dietary Intake   Breakfast  100 calorie muffing OR granola bar OR english muffin with butter    Lunch  salad OR Lean Cuisine OR soup    Dinner  salad OR same as lunch    Snack (evening)  occasionally a cookie    Beverage(s)  water, diet soda at night,       Exercise   Exercise Type  Light (walking / raking leaves)    How many days per week to you exercise?  4    How many minutes per day do you exercise?  20    Total minutes per week of exercise  80      Patient Education   Previous Diabetes Education  No     Disease state   Factors that contribute to the development of diabetes    Nutrition management   Carbohydrate counting;Food label reading, portion sizes and measuring food.;Reviewed blood glucose goals for pre and post meals and how to evaluate the patients' food intake on their blood glucose level.    Physical activity and exercise   Helped patient identify appropriate exercises in relation to his/her diabetes, diabetes complications and other health issue.    Medications  Reviewed patients medication for diabetes, action, purpose, timing of dose and side effects.    Monitoring  Identified appropriate SMBG and/or A1C goals.    Chronic complications  Relationship between chronic complications and blood glucose control      Individualized Goals (developed by patient)   Nutrition  General guidelines for healthy choices and portions discussed    Physical Activity  Exercise 3-5 times per week    Medications  take my medication as prescribed    Monitoring   test blood glucose pre and post meals as discussed      Post-Education Assessment   Patient understands the diabetes disease and treatment process.  Demonstrates understanding / competency    Patient understands incorporating nutritional management into lifestyle.  Demonstrates understanding / competency    Patient undertands incorporating physical activity into lifestyle.  Demonstrates understanding / competency    Patient understands using medications safely.  Demonstrates understanding / competency    Patient understands monitoring blood glucose, interpreting and using results  Demonstrates understanding / competency    Patient understands prevention, detection, and treatment of acute complications.  Demonstrates understanding / competency    Patient understands prevention, detection, and treatment of chronic complications.  Demonstrates understanding / competency    Patient understands how to develop strategies to address psychosocial  issues.  Demonstrates understanding / competency    Patient understands how to develop strategies to promote health/change behavior.  Demonstrates understanding / competency      Outcomes   Expected Outcomes  Demonstrated interest in learning. Expect positive outcomes    Future DMSE  PRN    Program Status  Completed       Individualized Plan for Diabetes Self-Management Training:   Learning Objective:  Patient will have a greater understanding of diabetes self-management. Patient education plan is to attend individual and/or group sessions per assessed needs and concerns.   Plan:   Patient Instructions  Plan:  Aim for at least 2 Carb Choices per meal (30 grams)   Aim for 0-1 Carbs per  snack if hungry  Include protein in moderation with your meals and snacks Continue reading food labels for Total Carbohydrate of foods Consider  increasing your activity level with Arm Chair Exercises or Recumbent Bike  as tolerated Consider checking BG at alternate times per day   Continue taking medication as directed by MD  Expected Outcomes:  Demonstrated interest in learning. Expect positive outcomes  Education material provided: Food label handouts, A1C conversion sheet, Meal plan card, Snack sheet and Carbohydrate counting sheet Arm Chair Exercises, DM Medication handout  If problems or questions, patient to contact team via:  Phone  Future DSME appointment: PRN

## 2018-03-17 DIAGNOSIS — Z961 Presence of intraocular lens: Secondary | ICD-10-CM | POA: Diagnosis not present

## 2018-03-17 DIAGNOSIS — E119 Type 2 diabetes mellitus without complications: Secondary | ICD-10-CM | POA: Diagnosis not present

## 2018-03-17 DIAGNOSIS — H524 Presbyopia: Secondary | ICD-10-CM | POA: Diagnosis not present

## 2018-03-17 DIAGNOSIS — H353131 Nonexudative age-related macular degeneration, bilateral, early dry stage: Secondary | ICD-10-CM | POA: Diagnosis not present

## 2018-04-25 DIAGNOSIS — M25571 Pain in right ankle and joints of right foot: Secondary | ICD-10-CM | POA: Diagnosis not present

## 2018-04-25 DIAGNOSIS — M79671 Pain in right foot: Secondary | ICD-10-CM | POA: Diagnosis not present

## 2018-04-25 DIAGNOSIS — M14671 Charcot's joint, right ankle and foot: Secondary | ICD-10-CM | POA: Diagnosis not present

## 2018-04-28 DIAGNOSIS — H353131 Nonexudative age-related macular degeneration, bilateral, early dry stage: Secondary | ICD-10-CM | POA: Diagnosis not present

## 2018-05-05 DIAGNOSIS — S76011A Strain of muscle, fascia and tendon of right hip, initial encounter: Secondary | ICD-10-CM | POA: Diagnosis not present

## 2018-05-05 DIAGNOSIS — M25551 Pain in right hip: Secondary | ICD-10-CM | POA: Diagnosis not present

## 2018-06-15 DIAGNOSIS — N184 Chronic kidney disease, stage 4 (severe): Secondary | ICD-10-CM | POA: Diagnosis not present

## 2018-06-15 DIAGNOSIS — Z0000 Encounter for general adult medical examination without abnormal findings: Secondary | ICD-10-CM | POA: Diagnosis not present

## 2018-06-15 DIAGNOSIS — E11610 Type 2 diabetes mellitus with diabetic neuropathic arthropathy: Secondary | ICD-10-CM | POA: Diagnosis not present

## 2018-06-15 DIAGNOSIS — G629 Polyneuropathy, unspecified: Secondary | ICD-10-CM | POA: Diagnosis not present

## 2018-06-15 DIAGNOSIS — E291 Testicular hypofunction: Secondary | ICD-10-CM | POA: Diagnosis not present

## 2018-06-15 DIAGNOSIS — M545 Low back pain: Secondary | ICD-10-CM | POA: Diagnosis not present

## 2018-06-15 DIAGNOSIS — E7800 Pure hypercholesterolemia, unspecified: Secondary | ICD-10-CM | POA: Diagnosis not present

## 2018-06-15 DIAGNOSIS — K219 Gastro-esophageal reflux disease without esophagitis: Secondary | ICD-10-CM | POA: Diagnosis not present

## 2018-06-15 DIAGNOSIS — S39012A Strain of muscle, fascia and tendon of lower back, initial encounter: Secondary | ICD-10-CM | POA: Diagnosis not present

## 2018-06-15 DIAGNOSIS — Z125 Encounter for screening for malignant neoplasm of prostate: Secondary | ICD-10-CM | POA: Diagnosis not present

## 2018-06-15 DIAGNOSIS — E669 Obesity, unspecified: Secondary | ICD-10-CM | POA: Diagnosis not present

## 2018-06-15 DIAGNOSIS — M25551 Pain in right hip: Secondary | ICD-10-CM | POA: Diagnosis not present

## 2018-06-15 DIAGNOSIS — I739 Peripheral vascular disease, unspecified: Secondary | ICD-10-CM | POA: Diagnosis not present

## 2018-06-15 DIAGNOSIS — M4180 Other forms of scoliosis, site unspecified: Secondary | ICD-10-CM | POA: Diagnosis not present

## 2018-06-15 DIAGNOSIS — Z79899 Other long term (current) drug therapy: Secondary | ICD-10-CM | POA: Diagnosis not present

## 2018-06-15 DIAGNOSIS — E1149 Type 2 diabetes mellitus with other diabetic neurological complication: Secondary | ICD-10-CM | POA: Diagnosis not present

## 2018-06-15 DIAGNOSIS — I10 Essential (primary) hypertension: Secondary | ICD-10-CM | POA: Diagnosis not present

## 2018-06-15 DIAGNOSIS — K279 Peptic ulcer, site unspecified, unspecified as acute or chronic, without hemorrhage or perforation: Secondary | ICD-10-CM | POA: Diagnosis not present

## 2018-06-15 DIAGNOSIS — I679 Cerebrovascular disease, unspecified: Secondary | ICD-10-CM | POA: Diagnosis not present

## 2018-06-15 DIAGNOSIS — C50919 Malignant neoplasm of unspecified site of unspecified female breast: Secondary | ICD-10-CM | POA: Diagnosis not present

## 2018-06-15 LAB — HEMOGLOBIN A1C: Hemoglobin A1C: 7.3

## 2018-06-22 DIAGNOSIS — M545 Low back pain: Secondary | ICD-10-CM | POA: Diagnosis not present

## 2018-07-20 DIAGNOSIS — M79671 Pain in right foot: Secondary | ICD-10-CM | POA: Diagnosis not present

## 2018-07-20 DIAGNOSIS — M25571 Pain in right ankle and joints of right foot: Secondary | ICD-10-CM | POA: Diagnosis not present

## 2018-07-20 DIAGNOSIS — M14671 Charcot's joint, right ankle and foot: Secondary | ICD-10-CM | POA: Diagnosis not present

## 2018-07-26 ENCOUNTER — Other Ambulatory Visit: Payer: Self-pay | Admitting: Oncology

## 2018-07-27 DIAGNOSIS — M25551 Pain in right hip: Secondary | ICD-10-CM | POA: Diagnosis not present

## 2018-07-27 DIAGNOSIS — M545 Low back pain: Secondary | ICD-10-CM | POA: Diagnosis not present

## 2018-08-04 ENCOUNTER — Other Ambulatory Visit: Payer: Self-pay

## 2018-08-04 NOTE — Patient Outreach (Signed)
  [facility]) Care Management Chronic Special Needs Program  08/04/2018  Name: Kristine Parrish DOB: 12-12-1945  MRN: [id number]  [name] is enrolled in a chronic special needs plan for Diabetes. Client called with no answer No answer and HIPAA compliant message left. Plan for 2nd outreach call in one week Chronic care management coordinator will attempt outreach in one week.   Peter Garter RN, Jackquline Denmark, CDE Chronic Care Management Coordinator [facility] Care Management [phone]

## 2018-08-11 ENCOUNTER — Other Ambulatory Visit: Payer: Self-pay

## 2018-08-11 NOTE — Patient Outreach (Signed)
[facility]) Care Management Chronic Special Needs Program  08/11/2018  Name: Kristine Parrish DOB: March 11, 1945  MRN: [id number]  Ms. Kristine Parrish is enrolled in a chronic special needs plan for Diabetes. Chronic Care Management Coordinator telephoned client to review health risk assessment and to develop individualized care plan.  Introduced the chronic care management program, importance of client participation, and taking their care plan to all provider appointments and inpatient facilities.  Reviewed the transition of care process and possible referral to community care management.  Subjective:Client states that she would like to exercise but her foot issue keeps her from walking much.  States she has not looked into [employer] States she tries to eat health most of the time but does like an occasional sweet.  States her blood sugars range in the low 100's.  States her last low reading was about a month ago when she had not eaten as much as usual.  States her B/P is good with her medication.  Goals Addressed            This Visit's Progress   .  Acknowledge receipt of Programme researcher, broadcasting/film/video      .  Diabetes Patient stated goal to enroll in Silver sneakers in the next 6 months (pt-stated)       Call to enroll in [place] at [phone]    . Advanced Care Planning complete by next 9 months       [employer] worker will call you    . Client understands the importance of follow-up with providers by attending scheduled visits      . Client will use Assistive Devices as needed and verbalize understanding of device use      . Client will verbalize knowledge of self management of Hypertension as evidences by BP reading of 140/90 or less; or as defined by provider      . HEMOGLOBIN A1C < 7.0       Diabetes self management actions:  Glucose monitoring per provider recommendations  Eat Healthy  Check feet daily  Visit provider every  3-6 months as directed  Hbg A1C level every 3-6 months.  Eye Exam yearly    . Maintain timely refills of diabetic medication as prescribed within the year .      Marland Kitchen Obtain annual  Lipid Profile, LDL-C      . Obtain Annual Eye (retinal)  Exam       . Obtain Annual Foot Exam      . Obtain annual screen for micro albuminuria (urine) , nephropathy (kidney problems)      . Obtain Hemoglobin A1C at least 2 times per year      . Visit Primary Care Provider or Endocrinologist at least 2 times per year        Client is not meeting diabetes self management goal of hemoglobin A1C of <7% with last reading of 7.3%.  Client declines pharmacy referral at this time and has no difficulty with medications.  Client agreeable to referral to Social Work to assist with Advanced Directives  Instructed on s/s of hypoglycemia and the rule of 15 actions to take.   Encouraged to enroll in [employer] and that they have on-line  Chair classes she can take and encouraged to exercise in a pool when they are opened back up  Plan:  Send successful outreach letter with a copy of their individualized care plan, Send individual care plan to provider and Send  educational material-HTN, Hypoglycemia, Advanced Directives, Silver Sneakers flyer  Chronic care management coordination will outreach in:  4-5 Months  Will refer client to:  Social Work   Peter Garter RN, [employer], [place] Management [phone]

## 2018-08-12 ENCOUNTER — Other Ambulatory Visit: Payer: Self-pay

## 2018-08-12 NOTE — Patient Outreach (Signed)
[place] [facility]) Care Management  08/12/2018  Kristine Parrish 08-08-1945 [id number]  Unsuccessful outreach to patient regarding social work referral for assistance with Advance Directives.  BSW left voicemail messages on home and mobile numbers.  Will attempt to reach again within four business days.  Ronn Melena, BSW Social Worker [phone]

## 2018-08-15 ENCOUNTER — Ambulatory Visit: Payer: Self-pay

## 2018-08-15 ENCOUNTER — Other Ambulatory Visit: Payer: Self-pay

## 2018-08-15 NOTE — Patient Outreach (Addendum)
[facility]) Care Management  08/15/2018  Kristine Parrish 1946-01-05 [id number]   Second unsuccessful outreach to patient regarding social work referral for assistance with Advance Directives.  BSW left voicemail messages on home and mobile numbers. Unsuccessful outreach letter mailed.  Will attempt to reach again within four business days   Addendum: BSW received return call from patient. BSW and patient discussed HC POA and Living Will.  BSW will mail Advance Directive EMMI and packet.  Will follow up with patient within the next two weeks to ensure receipt and review further  Ronn Melena, [place] Worker [phone]

## 2018-08-17 ENCOUNTER — Ambulatory Visit: Payer: Self-pay

## 2018-08-29 ENCOUNTER — Other Ambulatory Visit: Payer: Self-pay

## 2018-08-29 NOTE — Patient Outreach (Signed)
[facility]) Care Management  08/29/2018  Kristine Parrish 09-Nov-1945 [id number]   Successful follow up call to patient today.  She confirmed receipt of Advance Directive EMMI and packet, however, she has not yet been able to review the information.  BSW provided her with contact information and encouraged her to call if she has any questions when able to review.  BSW closing case.  Ronn Melena, BSW Social Worker [phone]

## 2018-09-22 ENCOUNTER — Other Ambulatory Visit: Payer: Self-pay | Admitting: Oncology

## 2018-09-22 DIAGNOSIS — Z853 Personal history of malignant neoplasm of breast: Secondary | ICD-10-CM

## 2018-10-04 DIAGNOSIS — R0600 Dyspnea, unspecified: Secondary | ICD-10-CM | POA: Diagnosis not present

## 2018-10-06 DIAGNOSIS — D649 Anemia, unspecified: Secondary | ICD-10-CM | POA: Diagnosis not present

## 2018-10-13 ENCOUNTER — Other Ambulatory Visit: Payer: Self-pay | Admitting: Family Medicine

## 2018-10-13 DIAGNOSIS — R0600 Dyspnea, unspecified: Secondary | ICD-10-CM

## 2018-10-20 ENCOUNTER — Other Ambulatory Visit: Payer: Self-pay

## 2018-10-20 ENCOUNTER — Ambulatory Visit
Admission: RE | Admit: 2018-10-20 | Discharge: 2018-10-20 | Disposition: A | Discharge status: Home / Self Care | Payer: HMO | Source: Ambulatory Visit | Attending: Family Medicine | Admitting: Family Medicine

## 2018-10-20 DIAGNOSIS — R0600 Dyspnea, unspecified: Secondary | ICD-10-CM

## 2018-10-20 DIAGNOSIS — D649 Anemia, unspecified: Secondary | ICD-10-CM | POA: Diagnosis not present

## 2018-10-20 MED ORDER — IOPAMIDOL (ISOVUE-300) INJECTION 61%
75.0000 mL | Freq: Once | INTRAVENOUS | Status: AC | PRN
  Administered 2018-10-20: 75 mL via INTRAVENOUS

## 2018-10-24 ENCOUNTER — Ambulatory Visit
Admission: RE | Admit: 2018-10-24 | Discharge: 2018-10-24 | Disposition: A | Discharge status: Home / Self Care | Payer: HMO | Source: Ambulatory Visit | Attending: Oncology | Admitting: Oncology

## 2018-10-24 ENCOUNTER — Other Ambulatory Visit: Payer: Self-pay

## 2018-10-24 ENCOUNTER — Other Ambulatory Visit: Payer: Self-pay | Admitting: Oncology

## 2018-10-24 DIAGNOSIS — N6320 Unspecified lump in the left breast, unspecified quadrant: Secondary | ICD-10-CM

## 2018-10-24 DIAGNOSIS — R922 Inconclusive mammogram: Secondary | ICD-10-CM | POA: Diagnosis not present

## 2018-10-24 DIAGNOSIS — N6489 Other specified disorders of breast: Secondary | ICD-10-CM | POA: Diagnosis not present

## 2018-10-24 DIAGNOSIS — Z853 Personal history of malignant neoplasm of breast: Secondary | ICD-10-CM

## 2018-10-26 ENCOUNTER — Other Ambulatory Visit: Payer: Self-pay | Admitting: Oncology

## 2018-11-05 NOTE — Progress Notes (Signed)
[facility]  [phone] [phone]     ID: Kristine Parrish DOB: [birth year]  MR#: [id number]  PIR#:[id number]  Patient Care Team: Orpah Melter, MD as PCP - General (Family Medicine) Jovita Kussmaul, MD as Consulting Physician (General Surgery) , Virgie Dad, MD as Consulting Physician (Oncology) Eppie Gibson, MD as Attending Physician (Radiation Oncology) Murvin Donning, MD (Dermatology) Laurence Spates, MD as Consulting Physician (Gastroenterology) Rosemary Holms, DPM as Consulting Physician (Podiatry) Maisie Fus, MD as Consulting Physician (Obstetrics and Gynecology) Larey Dresser, MD as Consulting Physician (Cardiology) Wylene Simmer, MD as Consulting Physician (Orthopedic Surgery) Dimitri Ped, RN as [place] Management PCP: Orpah Melter, MD OTHER MD:  CHIEF COMPLAINT: Estrogen receptor positive breast cancer  CURRENT TREATMENT: anastrozole   BREAST CANCER HISTORY: From the original intake note  Kristine Parrish herself noted a change in her left breast and brought it to the attention of Dr. Nori Riis, who set her up for bilateral diagnostic mammography with tomography and left breast ultrasonography at the [facility] 08/09/2015. The breast density was category D. In the area of palpable concern there was a mass with irregular margins and architectural distortion, measuring approximately 3 cm. This was palpable in the upper outer quadrant near the nipple. The ultrasound confirmed an irregular hypoechoic mass approximately 3 cm from the nipple at the 10:00 position measuring 2.7 cm. Ultrasound of the left axilla found a single level I left axillary node with focal cortical thickening.  On 08/13/2015 the patient underwent biopsy of the left breast mass and the suspicious left axillary lymph node, both showing invasive ductal carcinoma, grade 2 or 3, estrogen receptor 100% positive, progesterone receptor 100% positive,  both with strong staining intensity, with an MIB-1 of 50%, and HER-2 amplification, the signals ratio being 2.2 to and the number per cell 3.55  The patient's subsequent history is as detailed below  INTERVAL HISTORY: Kristine Parrish returns today for follow-up and treatment of her estrogen receptor positive breast cancer. She was last seen here on 11/04/2017.  She continues on anastrozole.  She tolerates this generally well, with no unusual side effects  Kalisa's last bone density screening on 06/24/2016, showed a T-score of -0.7, which is considered normal.    She says that about 4 months ago or whenever she went to the beach with a friend she noticed how very short of breath she was.  She can barely get up a few steps to get up to her home.  She does not think this was the case 6 months ago.  She is being worked up for this by her primary care physician Dr. Doyle Askew.  Unfortunately I do not have a copy of his notes or lab work.  She did have a CT scan of the chest, on 10/20/2018 for dyspnea showing: No acute CT finding. Surgical changes of the left breast, as well as left axillary nodal dissection. There is a cystic structure associated with the nodal dissection of the axilla, measuring as large as 5.9 cm, most likely a seroma or lymphocele. The left breast skin is significantly thickened compared to the right. Correlation with physical exam and the patient's radiation treatment schedule is recommended, as this most likely represents treatment changes. Progression of disease, including lymphatic involvement of the superficial tissues not excluded. Chronic lung changes include bronchiectasis, evidence of small airway disease, and mild architectural distortion.  Because of the concern expressed above she underwent a digital diagnostic bilateral mammogram with tomography  and a left breast ultrasound on 10/24/2018 showing: Breast Density Category C. A BB has been placed at the palpable site on the upper-outer left  breast. The patient's lumpectomy site is deep to the palpable marker, which appears stable. No suspicious findings are seen deep to this skin marker. Again noted is a circumscribed seroma in the left axilla. Skin thickening noted in the anterior left breast, which has been stable since 2018 (the first mammogram following lumpectomy and radiation treatment). No suspicious calcifications, masses or areas of distortion are seen in the bilateral breasts. Physical exam of the palpable site of concern demonstrates a smooth firm broad ridge of tissue. No discrete palpable masses identified. Ultrasound targeted to the palpable area at 2-3 o'clock demonstrates normal tissue as well as the oil cyst and scarring at the lumpectomy site. No suspicious masses or areas of shadowing are identified.  Kristine Parrish tells me she took a Hemoccult to Dr. Doyle Askew office today  REVIEW OF SYSTEMS: Makaia says she cannot exercise.  She says that if she had parked in our parking lot she would not have mated to the office.  She says she could have done that a few months ago.  She denies cough, phlegm production, pleurisy, or hemoptysis.  She denies chest pain or pressure.  She is not aware of palpitations.  She has had no bright red blood per rectum or melena.  She says her blood sugars are "up and down".  A detailed review of systems today was otherwise stable  PAST MEDICAL HISTORY: Past Medical History:  Diagnosis Date   Arthritis    "hands "   Breast cancer of upper-outer quadrant of left female breast ([facility]) 08/15/2015   Colon polyps    Dyspnea    with exertion- "from chemo"   GERD (gastroesophageal reflux disease)    Hiatal hernia    History of bronchitis    History of radiation therapy 05/19/16- 06/29/16   Left Breast 50 Gy in 25 fractions, SCLV/PAB 45 Gy in 25 fractions, Left Breast boost 10 Gy in 5 fractions.    Hypertension    Neuropathy    from chemopathy   Peptic ulcer disease    Peripheral neuropathy     Personal history of chemotherapy    Personal history of radiation therapy    Type 2 diabetes mellitus ([place])    Type II    PAST SURGICAL HISTORY: Past Surgical History:  Procedure Laterality Date   APPENDECTOMY  1974   BREAST LUMPECTOMY Left 02/14/2016   cataract surgery Bilateral 04/2012   CHOLECYSTECTOMY  1996   COLONOSCOPY     FOOT SURGERY     left bunion removed   nerve removed from left foot  2000   PORTACATH PLACEMENT N/A 09/05/2015   Procedure: INSERTION PORT-A-CATH;  Surgeon: Autumn Messing III, MD;  Location: [facility];  Service: General;  Laterality: N/A;   RADIOACTIVE SEED GUIDED PARTIAL MASTECTOMY/AXILLARY SENTINEL NODE BIOPSY/AXILLARY NODE DISSECTION Left 02/14/2016   Procedure: LEFT BREAST RADIOACTIVE SEED GUIDED LUMPECTOMY WITH LEFT  SENTINEL LYMPH NODE BIOPSY AND LEFT  SEED TARGETED DISSECTION;  Surgeon: Autumn Messing III, MD;  Location: [facility];  Service: General;  Laterality: Left;   TUBAL LIGATION     VAGINAL HYSTERECTOMY  1995    FAMILY HISTORY Family History  Problem Relation Age of Onset   Diabetes Mother    Hypertension Mother    Alcoholism Maternal Grandfather    Congestive Heart Failure Maternal Grandmother   The patient has little information  about her father and is not sure of his cause of death or age at death. The patient's mother died at age 74. The patient had no siblings. She was raised by her grandmother. She is not aware of any breast or ovarian cancer history in the family   GYNECOLOGIC HISTORY:  No LMP recorded. Patient has had a hysterectomy.  menarche age 75, first live birth age 46. The patient is GX P2. She underwent hysterectomy with bilateral salpingo-oophorectomy in 1994, and has been on estrogen replacement since that time, discontinued June 2017.   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  She is considering retirement in late 2019.  She is divorced and lives alone, with 1 cat. Her other cat  died of kidney issues. Daughter, [name] lives in [place] and works as a Teacher, music for the [employer]. Son, Germaine Ripp lives in [place] where he is Secondary school teacher. The patient has one granddaughter, 73 years old as of August 2019.  She attends [employer] [place] [employer]. The patient's daughter [name] is her healthcare power of attorney.     HEALTH MAINTENANCE: Social History   Tobacco Use   Smoking status: Never Smoker   Smokeless tobacco: Never Used  Substance Use Topics   Alcohol use: No   Drug use: No     Colonoscopy: 2012/[facility]  PAP:  Bone density:  06/24/2016 shows a T score of -0.7 (normal)  Lipid panel:  Allergies  Allergen Reactions   No Known Allergies     Current Outpatient Medications  Medication Sig Dispense Refill   anastrozole (ARIMIDEX) 1 MG tablet TAKE 1 TABLET BY MOUTH EVERY DAY 30 tablet 5   aspirin 81 MG tablet Take 81 mg by mouth daily.     gabapentin (NEURONTIN) 800 MG tablet Take 800 mg by mouth 3 (three) times daily.     glipiZIDE (GLUCOTROL) 10 MG tablet Take 10 mg by mouth daily before breakfast.     losartan (COZAAR) 25 MG tablet Take 1 tablet (25 mg total) by mouth daily. 30 tablet 6   lovastatin (MEVACOR) 20 MG tablet Take 20 mg by mouth at bedtime.     metFORMIN (GLUCOPHAGE) 500 MG tablet Take 500 mg by mouth 2 (two) times daily with a meal.      Multiple Vitamin (MULTIVITAMIN) tablet Take 1 tablet by mouth daily.     omeprazole (PRILOSEC) 20 MG capsule Take 20 mg by mouth daily.     pioglitazone (ACTOS) 15 MG tablet Take 15 mg by mouth daily.     Probiotic Product (PROBIOTIC DAILY PO) Take 1 tablet by mouth daily. Reported on 08/21/2015     spironolactone (ALDACTONE) 25 MG tablet Take 0.5 tablets (12.5 mg total) by mouth daily. 45 tablet 3   venlafaxine XR (EFFEXOR-XR) 37.5 MG 24 hr capsule Take 1 capsule (37.5 mg total) by mouth daily with breakfast.  90 capsule 1   No current facility-administered medications for this visit.     OBJECTIVE: Middle-aged white woman who appears stated age 63:   11/07/18 1339  BP: 132/67  Pulse: 87  Resp: 18  Temp: 98.3 F (36.8 C)  SpO2: 99%   Wt Readings from Last 3 Encounters:  11/07/18 190 lb 11.2 oz (86.5 kg)  11/04/17 183 lb (83 kg)  06/01/17 179 lb 8 oz (81.4 kg)   Body mass index is 32.23 kg/m.  ECOG FS:2 - Symptomatic, <50% confined to bed  Ocular: Sclerae unicteric, pupils round and equal Ear-nose-throat: Wearing a mask Lymphatic: No cervical or supraclavicular adenopathy Lungs no rales or rhonchi Heart regular rate and rhythm Abd soft, obese, nontender, positive bowel sounds MSK no focal spinal tenderness, no joint edema Neuro: non-focal, well-oriented, appropriate affect Breasts: The right breast is benign.  The left breast has undergone lumpectomy and radiation.  There is the expected thickening of the skin from the radiation treatments.  There are no findings suggestive of disease recurrence.  She is known to have a seroma in the left axilla.  LAB RESULTS:  CMP     Component Value Date/Time   NA 141 11/07/2018 1320   NA 140 12/28/2016 1043   K 3.7 11/07/2018 1320   K 4.1 12/28/2016 1043   CL 105 11/07/2018 1320   CO2 24 11/07/2018 1320   CO2 26 12/28/2016 1043   GLUCOSE 196 (H) 11/07/2018 1320   GLUCOSE 173 (H) 12/28/2016 1043   BUN 12 11/07/2018 1320   BUN 16.9 12/28/2016 1043   CREATININE 0.85 11/07/2018 1320   CREATININE 0.9 12/28/2016 1043   CALCIUM 8.9 11/07/2018 1320   CALCIUM 9.3 12/28/2016 1043   PROT 7.0 11/07/2018 1320   PROT 7.0 12/28/2016 1043   ALBUMIN 3.8 11/07/2018 1320   ALBUMIN 3.6 12/28/2016 1043   AST 15 11/07/2018 1320   AST 17 12/28/2016 1043   ALT 16 11/07/2018 1320   ALT 17 12/28/2016 1043   ALKPHOS 132 (H) 11/07/2018 1320   ALKPHOS 158 (H) 12/28/2016 1043   BILITOT 0.3 11/07/2018 1320   BILITOT 0.36 12/28/2016 1043    GFRNONAA >60 11/07/2018 1320   GFRAA >60 11/07/2018 1320    INo results found for: SPEP, UPEP  Lab Results  Component Value Date   WBC 6.2 11/07/2018   NEUTROABS 4.3 11/07/2018   HGB 10.0 (L) 11/07/2018   HCT 34.2 (L) 11/07/2018   MCV 83.2 11/07/2018   PLT 247 11/07/2018      Chemistry      Component Value Date/Time   NA 141 11/07/2018 1320   NA 140 12/28/2016 1043   K 3.7 11/07/2018 1320   K 4.1 12/28/2016 1043   CL 105 11/07/2018 1320   CO2 24 11/07/2018 1320   CO2 26 12/28/2016 1043   BUN 12 11/07/2018 1320   BUN 16.9 12/28/2016 1043   CREATININE 0.85 11/07/2018 1320   CREATININE 0.9 12/28/2016 1043      Component Value Date/Time   CALCIUM 8.9 11/07/2018 1320   CALCIUM 9.3 12/28/2016 1043   ALKPHOS 132 (H) 11/07/2018 1320   ALKPHOS 158 (H) 12/28/2016 1043   AST 15 11/07/2018 1320   AST 17 12/28/2016 1043   ALT 16 11/07/2018 1320   ALT 17 12/28/2016 1043   BILITOT 0.3 11/07/2018 1320   BILITOT 0.36 12/28/2016 1043       No results found for: LABCA2  No components found for: LABCA125  No results for input(s): INR in the last 168 hours.  Urinalysis No results found for: COLORURINE, APPEARANCEUR, LABSPEC, PHURINE, GLUCOSEU, HGBUR, BILIRUBINUR, KETONESUR, PROTEINUR, UROBILINOGEN, NITRITE, LEUKOCYTESUR  STUDIES: Ct Chest W Contrast  Result Date: 10/20/2018 CLINICAL DATA:  73 year old female with a history of dyspnea and anemia EXAM: CT CHEST WITH CONTRAST TECHNIQUE: Multidetector CT imaging of the chest was performed during intravenous contrast administration. CONTRAST:  56m ISOVUE-300 IOPAMIDOL (ISOVUE-300) INJECTION 61% COMPARISON:  No comparison CTs. FINDINGS: Cardiovascular: Heart size within normal limits.  Trace pericardial fluid/thickening. No significant calcified coronary artery disease. Normal course caliber contour of the thoracic aorta with minimal atherosclerosis. Timing of the contrast bolus not optimized for pulmonary arteries, however, main  pulmonary artery within normal limits in diameter with no central filling defects. Mediastinum/Nodes: Small lymph nodes of the mediastinum, none of which are enlarged. The index node in the lowest right paratracheal station measures 6 mm-7 mm. Unremarkable thoracic inlet Unremarkable esophagus. Lungs/Pleura: No pneumothorax or pleural effusion. Early bronchiectasis bilateral lungs. Mild architectural distortion of the lingula and the right middle lobe. No confluent airspace disease. Upper Abdomen: No acute finding of the upper abdomen. Musculoskeletal: No acute displaced fracture. Multilevel degenerative changes of the spine. No bony canal narrowing. Chest wall: Surgical changes of the left axilla. Adjacent to the surgical clips there is a rounded low-density structure measuring 5.9 cm x 2 point 9 cm, with fluid density Hounsfield units. No lymphadenopathy. Small lymph nodes in the right axillary region. Asymmetry of the left breast compared to the right with significant skin thickening on the CT. Surgical clips within the breast. IMPRESSION: No acute CT finding. Surgical changes of the left breast, as well as left axillary nodal dissection. There is a cystic structure associated with the nodal dissection of the axilla, measuring as large as 5.9 cm, most likely a seroma or lymphocele. The left breast skin is significantly thickened compared to the right. Correlation with physical exam and the patient's radiation treatment schedule is recommended, as this most likely represents treatment changes. Progression of disease, including lymphatic involvement of the superficial tissues not excluded. Chronic lung changes include bronchiectasis, evidence of small airway disease, and mild architectural distortion. Electronically Signed   By: Corrie Mckusick D.O.   On: 10/20/2018 10:19   US Breast Ltd Uni Left Inc Axilla  Result Date: 10/24/2018 CLINICAL DATA:  73 year old female with history of left breast lumpectomy with  radiation in 2017 presenting for annual surveillance. Additionally, the patient feels a new palpable lump in the upper-outer quadrant of the left breast. She had a recent CT scan which noted asymmetric left skin thickening. EXAM: DIGITAL DIAGNOSTIC BILATERAL MAMMOGRAM WITH CAD AND TOMO ULTRASOUND LEFT BREAST COMPARISON:  Previous exam(s). ACR Breast Density Category c: The breast tissue is heterogeneously dense, which may obscure small masses. FINDINGS: A BB has been placed at the palpable site on the upper-outer left breast. The patient's lumpectomy site is deep to the palpable marker, which appears stable. No suspicious findings are seen deep to this skin marker. Again noted is a circumscribed seroma in the left axilla. Skin thickening noted in the anterior left breast, which has been stable since 2018 (the first mammogram following lumpectomy and radiation treatment). No suspicious calcifications, masses or areas of distortion are seen in the bilateral breasts. Mammographic images were processed with CAD. Physical exam of the palpable site of concern demonstrates a smooth firm broad ridge of tissue. No discrete palpable masses identified. Ultrasound targeted to the palpable area at 2-3 o'clock demonstrates normal tissue as well as the oil cyst and scarring at the lumpectomy site. No suspicious masses or areas of shadowing are identified. IMPRESSION: 1. No suspicious findings are seen deep to the palpable marker in the upper-outer left breast. The palpable area likely correlates with the lumpectomy site, which is stable. 2. The skin thickening of the left breast has been stable since her first mammogram post treatment, and is compatible with radiation changes. 3.  No mammographic evidence of malignancy in the bilateral breasts.  RECOMMENDATION: 1. Clinical follow-up recommended for the palpable area of concern in the upper-outer left breast. Any further workup should be based on clinical grounds. 2.  Diagnostic  mammogram is suggested in 1 year. (Code:DM-B-01Y) I have discussed the findings and recommendations with the patient. Results were also provided in writing at the conclusion of the visit. If applicable, a reminder letter will be sent to the patient regarding the next appointment. BI-RADS CATEGORY  2: Benign. Electronically Signed   By: Ammie Ferrier M.D.   On: 10/24/2018 14:09   Mm Diag Breast Tomo Bilateral  Result Date: 10/24/2018 CLINICAL DATA:  73 year old female with history of left breast lumpectomy with radiation in 2017 presenting for annual surveillance. Additionally, the patient feels a new palpable lump in the upper-outer quadrant of the left breast. She had a recent CT scan which noted asymmetric left skin thickening. EXAM: DIGITAL DIAGNOSTIC BILATERAL MAMMOGRAM WITH CAD AND TOMO ULTRASOUND LEFT BREAST COMPARISON:  Previous exam(s). ACR Breast Density Category c: The breast tissue is heterogeneously dense, which may obscure small masses. FINDINGS: A BB has been placed at the palpable site on the upper-outer left breast. The patient's lumpectomy site is deep to the palpable marker, which appears stable. No suspicious findings are seen deep to this skin marker. Again noted is a circumscribed seroma in the left axilla. Skin thickening noted in the anterior left breast, which has been stable since 2018 (the first mammogram following lumpectomy and radiation treatment). No suspicious calcifications, masses or areas of distortion are seen in the bilateral breasts. Mammographic images were processed with CAD. Physical exam of the palpable site of concern demonstrates a smooth firm broad ridge of tissue. No discrete palpable masses identified. Ultrasound targeted to the palpable area at 2-3 o'clock demonstrates normal tissue as well as the oil cyst and scarring at the lumpectomy site. No suspicious masses or areas of shadowing are identified. IMPRESSION: 1. No suspicious findings are seen deep to the  palpable marker in the upper-outer left breast. The palpable area likely correlates with the lumpectomy site, which is stable. 2. The skin thickening of the left breast has been stable since her first mammogram post treatment, and is compatible with radiation changes. 3.  No mammographic evidence of malignancy in the bilateral breasts. RECOMMENDATION: 1. Clinical follow-up recommended for the palpable area of concern in the upper-outer left breast. Any further workup should be based on clinical grounds. 2.  Diagnostic mammogram is suggested in 1 year. (Code:DM-B-01Y) I have discussed the findings and recommendations with the patient. Results were also provided in writing at the conclusion of the visit. If applicable, a reminder letter will be sent to the patient regarding the next appointment. BI-RADS CATEGORY  2: Benign. Electronically Signed   By: Ammie Ferrier M.D.   On: 10/24/2018 14:09     ELIGIBLE FOR AVAILABLE RESEARCH PROTOCOL: Not eligible for PALLAS because HER-2 positive  ASSESSMENT: 73 y.o. [place] woman status post left breast upper outer quadrant and left axillary lymph node biopsy 08/13/2015 both positive for an invasive ductal carcinoma, grade 2 or 3, triple positive, with an MIB-1 of 50%  (1) neoadjuvant chemotherapy consisting of carboplatin, docetaxel, trastuzumab and pertuzumab given every 21 days Started 09/11/2015  (a) treatment changed to Abraxane and trastuzumab with cycle 2 because of side effects after cycle 1  (b) Abraxane discontinued after 3d dose (10/17/2015) because of peripheral neuropathy  (c) cyclophosphamide, methotrexate and fluorouracil (CMF) started 11/07/2015, 4 cycles, last dose 01/09/2016   (2) trastuzumab  continued to complete a year (last dose September 24, 2016)  (a) final echocardiogram 07/23/2016 showed a well-preserved ejection fraction at 55-60%  (3) status post left lumpectomy with targeted axillary dissection 02/14/2016 showing a residual pT1c pN1  invasive ductal carcinoma, grade 2, estrogen and progesterone receptor positive, now HER-2 not amplified; margins were negative  (a) left axillary seroma/abscess drained surgically 03/25/2016, but persists  (4) adjuvant radiation 05/19/16 - 06/29/16 Site/dose:    Left breast - 4 field             Left breast: 50 Gy in 25 fractions             SCLV/PAB: 45 Gy in 25 fractions Left breast boost: 10 GY in 5 fractions  (5) anastrozole started 07/07/2016  (a) bone density 06/24/2016 shows a T score of -0.7 (normal).  PLAN: Jamacia is now more than 2-1/2 years out from definitive surgery for her breast cancer with no evidence of disease recurrence.  This is very favorable.  She is tolerating anastrozole well and the plan is to continue that a total of 5 years.  She is appropriately concerned about her shortness of breath.  She is moderately anemic.  I gave her a copy of her hemoglobin is going back 3 years and she has been at or 10 which is where she has today previously without the same symptoms she is having now.  I also gave her a copy of her echocardiogram from 2 years ago which showed a good ejection fraction, with some diastolic dysfunction.  Her anemia is being worked up by Dr. Doyle Askew.  Unfortunately we do not share the same electronic medical record and I do not have a copy of his work-up so I could not discuss it with her.  For the same reason I am not working her anemia up since I would not want to duplicate his work.  Very likely she will need pulmonary evaluation at some point if a clear reversible reason for her shortness of breath is not found  She will return to see me in 6 months.  She knows to call for any other issues that may develop before that visit.  Jeneane Pieczynski, Virgie Dad, MD  11/07/18 2:16 PM Medical Oncology and Hematology [facility] [address] [place], [place] [address] Tel. [phone]    Fax. [phone]  I, Jacqualyn Posey am acting as a Education administrator for  Chauncey Cruel, MD.   I, Lurline Del MD, have reviewed the above documentation for accuracy and completeness, and I agree with the above.

## 2018-11-07 ENCOUNTER — Other Ambulatory Visit: Payer: Self-pay

## 2018-11-07 ENCOUNTER — Inpatient Hospital Stay (HOSPITAL_BASED_OUTPATIENT_CLINIC_OR_DEPARTMENT_OTHER): Payer: HMO | Admitting: Oncology

## 2018-11-07 ENCOUNTER — Inpatient Hospital Stay: Payer: HMO | Attending: Oncology

## 2018-11-07 VITALS — BP 132/67 | HR 87 | Temp 98.3°F | Resp 18 | Wt 190.7 lb

## 2018-11-07 DIAGNOSIS — Z170 Estrogen receptor positive status [ER+]: Secondary | ICD-10-CM | POA: Diagnosis not present

## 2018-11-07 DIAGNOSIS — Z79899 Other long term (current) drug therapy: Secondary | ICD-10-CM | POA: Diagnosis not present

## 2018-11-07 DIAGNOSIS — I10 Essential (primary) hypertension: Secondary | ICD-10-CM | POA: Insufficient documentation

## 2018-11-07 DIAGNOSIS — E119 Type 2 diabetes mellitus without complications: Secondary | ICD-10-CM | POA: Diagnosis not present

## 2018-11-07 DIAGNOSIS — Z9221 Personal history of antineoplastic chemotherapy: Secondary | ICD-10-CM | POA: Diagnosis not present

## 2018-11-07 DIAGNOSIS — Z7982 Long term (current) use of aspirin: Secondary | ICD-10-CM | POA: Insufficient documentation

## 2018-11-07 DIAGNOSIS — Z79811 Long term (current) use of aromatase inhibitors: Secondary | ICD-10-CM | POA: Diagnosis not present

## 2018-11-07 DIAGNOSIS — Z923 Personal history of irradiation: Secondary | ICD-10-CM | POA: Diagnosis not present

## 2018-11-07 DIAGNOSIS — Z7984 Long term (current) use of oral hypoglycemic drugs: Secondary | ICD-10-CM | POA: Insufficient documentation

## 2018-11-07 DIAGNOSIS — C50412 Malignant neoplasm of upper-outer quadrant of left female breast: Secondary | ICD-10-CM | POA: Diagnosis not present

## 2018-11-07 LAB — CBC WITH DIFFERENTIAL/PLATELET
Abs Immature Granulocytes: 0.03 10*3/uL (ref 0.00–0.07)
Basophils Absolute: 0 10*3/uL (ref 0.0–0.1)
Basophils Relative: 1 %
Eosinophils Absolute: 0.3 10*3/uL (ref 0.0–0.5)
Eosinophils Relative: 4 %
HCT: 34.2 % — ABNORMAL LOW (ref 36.0–46.0)
Hemoglobin: 10 g/dL — ABNORMAL LOW (ref 12.0–15.0)
Immature Granulocytes: 1 %
Lymphocytes Relative: 19 %
Lymphs Abs: 1.2 10*3/uL (ref 0.7–4.0)
MCH: 24.3 pg — ABNORMAL LOW (ref 26.0–34.0)
MCHC: 29.2 g/dL — ABNORMAL LOW (ref 30.0–36.0)
MCV: 83.2 fL (ref 80.0–100.0)
Monocytes Absolute: 0.4 10*3/uL (ref 0.1–1.0)
Monocytes Relative: 7 %
Neutro Abs: 4.3 10*3/uL (ref 1.7–7.7)
Neutrophils Relative %: 68 %
Platelets: 247 10*3/uL (ref 150–400)
RBC: 4.11 MIL/uL (ref 3.87–5.11)
RDW: 18.1 % — ABNORMAL HIGH (ref 11.5–15.5)
WBC: 6.2 10*3/uL (ref 4.0–10.5)
nRBC: 0 % (ref 0.0–0.2)

## 2018-11-07 LAB — COMPREHENSIVE METABOLIC PANEL
ALT: 16 U/L (ref 0–44)
AST: 15 U/L (ref 15–41)
Albumin: 3.8 g/dL (ref 3.5–5.0)
Alkaline Phosphatase: 132 U/L — ABNORMAL HIGH (ref 38–126)
Anion gap: 12 (ref 5–15)
BUN: 12 mg/dL (ref 8–23)
CO2: 24 mmol/L (ref 22–32)
Calcium: 8.9 mg/dL (ref 8.9–10.3)
Chloride: 105 mmol/L (ref 98–111)
Creatinine, Ser: 0.85 mg/dL (ref 0.44–1.00)
GFR calc Af Amer: >60 mL/min (ref >60)
GFR calc non Af Amer: >60 mL/min (ref >60)
Glucose, Bld: 196 mg/dL — ABNORMAL HIGH (ref 70–99)
Potassium: 3.7 mmol/L (ref 3.5–5.1)
Sodium: 141 mmol/L (ref 135–145)
Total Bilirubin: 0.3 mg/dL (ref 0.3–1.2)
Total Protein: 7 g/dL (ref 6.5–8.1)

## 2018-11-08 ENCOUNTER — Telehealth: Payer: Self-pay | Admitting: Oncology

## 2018-11-08 NOTE — Telephone Encounter (Signed)
I left a message regarding schedule  

## 2018-11-10 DIAGNOSIS — D509 Iron deficiency anemia, unspecified: Secondary | ICD-10-CM | POA: Diagnosis not present

## 2018-11-11 DIAGNOSIS — M79671 Pain in right foot: Secondary | ICD-10-CM | POA: Diagnosis not present

## 2018-11-11 DIAGNOSIS — M6701 Short Achilles tendon (acquired), right ankle: Secondary | ICD-10-CM | POA: Diagnosis not present

## 2018-11-11 DIAGNOSIS — M14671 Charcot's joint, right ankle and foot: Secondary | ICD-10-CM | POA: Diagnosis not present

## 2018-12-07 ENCOUNTER — Institutional Professional Consult (permissible substitution): Payer: HMO | Admitting: Pulmonary Disease

## 2018-12-09 ENCOUNTER — Ambulatory Visit: Payer: Self-pay | Admitting: Cardiology

## 2018-12-12 ENCOUNTER — Ambulatory Visit: Payer: HMO | Admitting: Pulmonary Disease

## 2018-12-12 ENCOUNTER — Encounter: Payer: Self-pay | Admitting: Pulmonary Disease

## 2018-12-12 ENCOUNTER — Other Ambulatory Visit: Payer: Self-pay

## 2018-12-12 VITALS — BP 134/76 | HR 58 | Ht 64.5 in | Wt 193.0 lb

## 2018-12-12 DIAGNOSIS — R0602 Shortness of breath: Secondary | ICD-10-CM | POA: Diagnosis not present

## 2018-12-12 MED ORDER — ALBUTEROL SULFATE HFA 108 (90 BASE) MCG/ACT IN AERS: 2.0000 | INHALATION_SPRAY | Freq: Four times a day (QID) | RESPIRATORY_TRACT | 6 refills | Status: DC | PRN

## 2018-12-12 NOTE — Progress Notes (Signed)
Subjective:     Patient ID: Kristine Parrish, female   DOB: 06/29/1945, 73 y.o.   MRN: [id number]  Patient is being seen for shortness of breath on exertion  History of breast cancer for which she had chemotherapy about 3 years ago Did receive radiation treatments  She has been having shortness of breath for about 6 months  History of Charcot foot Has been trying to stay off the foot May require surgery  Shortness of breath going up steps Shortness of breath walking on level ground  Has not been on any inhalers  No occupational predisposition to lung disease No family history of lung disease  No history of pneumonias  Denies any chronic cough, denies any wheezing  She does not smoke smoke  Past Medical History:  Diagnosis Date  . Arthritis    "hands "  . Breast cancer of upper-outer quadrant of left female breast ([place]) 08/15/2015  . Colon polyps   . Dyspnea    with exertion- "from chemo"  . GERD (gastroesophageal reflux disease)   . Hiatal hernia   . History of bronchitis   . History of radiation therapy 05/19/16- 06/29/16   Left Breast 50 Gy in 25 fractions, SCLV/PAB 45 Gy in 25 fractions, Left Breast boost 10 Gy in 5 fractions.   . Hypertension   . Neuropathy    from chemopathy  . Peptic ulcer disease   . Peripheral neuropathy   . Personal history of chemotherapy   . Personal history of radiation therapy   . Type 2 diabetes mellitus (HCC)    Type II   Social History   Socioeconomic History  . Marital status: Widowed    Spouse name: Not on file  . Number of children: 2  . Years of education: 4  . Highest education level: Not on file  Occupational History  . Not on file  Social Needs  . Financial resource strain: Not on file  . Food insecurity    Worry: Never true    Inability: Never true  . Transportation needs    Medical: No    Non-medical: No  Tobacco Use  . Smoking status: Never Smoker  . Smokeless tobacco: Never Used  Substance and Sexual  Activity  . Alcohol use: No  . Drug use: No  . Sexual activity: Not Currently  Lifestyle  . Physical activity    Days per week: Not on file    Minutes per session: Not on file  . Stress: Not on file  Relationships  . Social Herbalist on phone: Not on file    Gets together: Not on file    Attends religious service: Not on file    Active member of club or organization: Not on file    Attends meetings of clubs or organizations: Not on file    Relationship status: Not on file  . Intimate partner violence    Fear of current or ex partner: Not on file    Emotionally abused: Not on file    Physically abused: Not on file    Forced sexual activity: Not on file  Other Topics Concern  . Not on file  Social History Narrative   Patient is divorced.   Patient has two children   Patient works in a clerical position.   Patient has a high school education.   Patient drinks caffeine rarely.   Family History  Problem Relation Age of Onset  . Diabetes Mother   .  Hypertension Mother   . Alcoholism Maternal Grandfather   . Congestive Heart Failure Maternal Grandmother     Review of Systems  Constitutional: Negative for fever and unexpected weight change.  HENT: Negative for congestion, dental problem, ear pain, nosebleeds, postnasal drip, rhinorrhea, sinus pressure, sneezing, sore throat and trouble swallowing.   Eyes: Negative for redness and itching.  Respiratory: Positive for shortness of breath. Negative for cough, chest tightness and wheezing.   Cardiovascular: Negative for palpitations and leg swelling.  Gastrointestinal: Negative for nausea and vomiting.  Genitourinary: Negative for dysuria.  Musculoskeletal: Negative for joint swelling.  Skin: Negative for rash.  Allergic/Immunologic: Negative.  Negative for environmental allergies, food allergies and immunocompromised state.  Neurological: Negative for headaches.  Hematological: Does not bruise/bleed easily.   Psychiatric/Behavioral: Negative for dysphoric mood. The patient is not nervous/anxious.       Objective:   Physical Exam Constitutional:      General: She is not in acute distress.    Appearance: Normal appearance. She is not ill-appearing.  HENT:     Head: Normocephalic and atraumatic.     Mouth/Throat:     Mouth: Mucous membranes are moist.     Pharynx: No oropharyngeal exudate.  Eyes:     General:        Right eye: No discharge.     Extraocular Movements: Extraocular movements intact.     Pupils: Pupils are equal, round, and reactive to light.  Neck:     Musculoskeletal: Normal range of motion and neck supple. No neck rigidity or muscular tenderness.  Cardiovascular:     Rate and Rhythm: Normal rate and regular rhythm.     Heart sounds: No murmur. No friction rub.  Pulmonary:     Effort: Pulmonary effort is normal. No respiratory distress.     Breath sounds: Normal breath sounds. No stridor. No wheezing, rhonchi or rales.  Abdominal:     General: There is no distension.  Musculoskeletal: Normal range of motion.        General: No swelling or tenderness.  Skin:    General: Skin is warm and dry.     Coloration: Skin is not jaundiced or pale.     Findings: No bruising.  Neurological:     General: No focal deficit present.     Mental Status: She is alert.     Cranial Nerves: No cranial nerve deficit.     Sensory: No sensory deficit.    Vitals:   12/12/18 1408  BP: 134/76  Pulse: (!) 58  SpO2: 99%      CT scan of the chest reviewed by myself showing a mosaic pattern, some air trapping  Assessment:     Shortness of breath -Progressive over the last 6 months -no preceding acute illness -No recent hospitalizations -No underlying lung disease, no significant occupational predisposition to lung disease -No significant secondhand smoke exposure -Possibility of mild obstructive airway disease contributing to symptoms  History of diastolic dysfunction -  possibility of progression of disease  Deconditioning  Multifactorial reasons for shortness of breath  History of breast cancer     Plan:     We will obtain a pulmonary function study  Repeat echocardiogram to assess right-sided cardiac function and also diastolic function  Physical activity as tolerated  Empiric trial with albuterol  Following reviewing PFT, may be able to escalate inhaler use

## 2018-12-12 NOTE — Patient Instructions (Signed)
Unexplained shortness of breath  Obtain echocardiogram -ultrasound of the heart to compare with previous-assess if there is any changes compared to previous  Obtain breathing study  Albuterol inhaler for shortness of breath-to be used as needed  I will see you back in about 6 weeks  Call with significant concerns

## 2018-12-13 ENCOUNTER — Other Ambulatory Visit: Payer: Self-pay

## 2018-12-13 NOTE — Patient Outreach (Signed)
[facility]) Care Management Chronic Special Needs Program  12/13/2018  Name: Kristine Parrish DOB: 05-21-1945  MRN: [id number]  Kristine Parrish is enrolled in a chronic special needs plan for Diabetes. Reviewed and updated care plan.  Subjective: Client states that she has had SOB with exertion for about the last 6 months.  States she has been seeing different doctors to try to find out the cause.  States she is to have an echocardiogram tomorrow.  States that her blood sugars have been good and they have been ranging 96-130.  Denies any recent low readings.  Denies any chest pains or swelling in her legs.  States she is to see her primary care doctor next week for her 6 month check up and labs.  Goals Addressed            This Visit's Progress   . COMPLETED:  Acknowledge receipt of Advanced Directive package       Received packet    . Advanced Care Planning complete by next 9 months(continue 12/13/18   On track   . Client understands the importance of follow-up with providers by attending scheduled visits   On track   . Client will use Assistive Devices as needed and verbalize understanding of device use   On track   . Client will verbalize knowledge of self management of Hypertension as evidences by BP reading of 140/90 or less; or as defined by provider   On track   . Diabetes Patient stated goal to enroll in Silver sneakers in the next 6 months( continue 12/13/18) (pt-stated)   Not on track    Call to enroll in [facility] [place] at [phone]    . HEMOGLOBIN A1C < 7.0       Continue Diabetes self management actions:  Glucose monitoring per provider recommendations  Eat Healthy  Check feet daily  Visit provider every 3-6 months as directed  Hbg A1C level every 3-6 months.  Eye Exam yearly    . Maintain timely refills of diabetic medication as prescribed within the year .   On track   . COMPLETED: Obtain Annual Eye (retinal)  Exam        Completed  03/17/18    . Obtain Annual Foot Exam   On track   . Obtain annual screen for micro albuminuria (urine) , nephropathy (kidney problems)   On track   . Obtain Hemoglobin A1C at least 2 times per year   On track    Checked 06/15/18    . COMPLETED: Visit Primary Care Provider or Endocrinologist at least 2 times per year        Visit 06/14/18, 10/06/18     Client is not meeting diabetes self management goal of hemoglobin A1C of <7% with last reading of 7.3% Client completed referral with Education officer, museum.  She has Advanced Directives forms but has not completed yet Instructed to keep follow up appts with providers  Reinforced to follow a low CHO diet and to watch portion sizes Encouraged to look into on line low impact exercises offered by Silver Sneakers as tolerated pending her testing for SOB Reviewed foot care and importance of daily foot exam Reviewed number for 24-hour nurse Line Reviewed COVID 19 precautions  Plan:  Send successful outreach letter with a copy of their individualized care plan and Send individual care plan to provider  Chronic care management coordinator will outreach in:  6 Months     [address] RN, [place],  [facility] [phone]

## 2018-12-14 ENCOUNTER — Other Ambulatory Visit: Payer: Self-pay

## 2018-12-14 ENCOUNTER — Ambulatory Visit (HOSPITAL_COMMUNITY): Payer: HMO | Attending: Cardiovascular Disease

## 2018-12-14 DIAGNOSIS — R0602 Shortness of breath: Secondary | ICD-10-CM | POA: Diagnosis not present

## 2018-12-15 DIAGNOSIS — E1149 Type 2 diabetes mellitus with other diabetic neurological complication: Secondary | ICD-10-CM | POA: Diagnosis not present

## 2018-12-15 DIAGNOSIS — C50919 Malignant neoplasm of unspecified site of unspecified female breast: Secondary | ICD-10-CM | POA: Diagnosis not present

## 2018-12-15 DIAGNOSIS — K279 Peptic ulcer, site unspecified, unspecified as acute or chronic, without hemorrhage or perforation: Secondary | ICD-10-CM | POA: Diagnosis not present

## 2018-12-15 DIAGNOSIS — E669 Obesity, unspecified: Secondary | ICD-10-CM | POA: Diagnosis not present

## 2018-12-15 DIAGNOSIS — Z23 Encounter for immunization: Secondary | ICD-10-CM | POA: Diagnosis not present

## 2018-12-15 DIAGNOSIS — I10 Essential (primary) hypertension: Secondary | ICD-10-CM | POA: Diagnosis not present

## 2018-12-15 DIAGNOSIS — Z0000 Encounter for general adult medical examination without abnormal findings: Secondary | ICD-10-CM | POA: Diagnosis not present

## 2018-12-15 DIAGNOSIS — Z1159 Encounter for screening for other viral diseases: Secondary | ICD-10-CM | POA: Diagnosis not present

## 2018-12-15 DIAGNOSIS — D509 Iron deficiency anemia, unspecified: Secondary | ICD-10-CM | POA: Diagnosis not present

## 2018-12-15 DIAGNOSIS — E11610 Type 2 diabetes mellitus with diabetic neuropathic arthropathy: Secondary | ICD-10-CM | POA: Diagnosis not present

## 2018-12-15 DIAGNOSIS — G629 Polyneuropathy, unspecified: Secondary | ICD-10-CM | POA: Diagnosis not present

## 2018-12-15 DIAGNOSIS — E7800 Pure hypercholesterolemia, unspecified: Secondary | ICD-10-CM | POA: Diagnosis not present

## 2018-12-19 NOTE — Progress Notes (Signed)
Cardiology Office Note:    Date:  12/20/2018   ID:  Kristine Parrish, DOB January 08, 1946, MRN [id number]  PCP:  Orpah Melter, MD  Cardiologist:  No primary care provider on file.  Electrophysiologist:  None   Referring MD: Orpah Melter, MD   Chief Complaint  Patient presents with  . Shortness of Breath   History of Present Illness:    Kristine Parrish is a 73 y.o. female with a hx of breast cancer, hypertension peptic ulcer disease, type 2 diabetes who is referred by Dr. Doyle Askew for evaluation for shortness of breath and edema.  Reports that she has had progressively worsening dyspnea on exertion.  Recently went to the beach and noted that she would have to stop frequently while walking to cath her breath.  Has to stop when walking up stairs.  No chest pain.  Has also noted intermittent LE edema.  TTE on 12/14/2018 showed EF 55 to 60%, no LVH, grade 1 diastolic dysfunction, normal RV function.  BNP was checked and was normal.  Never smoked.  Mother had MI in 59s.  .      Past Medical History:  Diagnosis Date  . Arthritis    "hands "  . Breast cancer of upper-outer quadrant of left female breast ([place]) 08/15/2015  . Colon polyps   . Dyspnea    with exertion- "from chemo"  . GERD (gastroesophageal reflux disease)   . Hiatal hernia   . History of bronchitis   . History of radiation therapy 05/19/16- 06/29/16   Left Breast 50 Gy in 25 fractions, SCLV/PAB 45 Gy in 25 fractions, Left Breast boost 10 Gy in 5 fractions.   . Hypertension   . Neuropathy    from chemopathy  . Peptic ulcer disease   . Peripheral neuropathy   . Personal history of chemotherapy   . Personal history of radiation therapy   . Type 2 diabetes mellitus ([place])    Type II    Past Surgical History:  Procedure Laterality Date  . APPENDECTOMY  1974  . BREAST LUMPECTOMY Left 02/14/2016  . cataract surgery Bilateral 04/2012  . CHOLECYSTECTOMY  1996  . COLONOSCOPY    . FOOT SURGERY     left bunion removed  . nerve  removed from left foot  2000  . PORTACATH PLACEMENT N/A 09/05/2015   Procedure: INSERTION PORT-A-CATH;  Surgeon: Autumn Messing III, MD;  Location: [facility];  Service: General;  Laterality: N/A;  . RADIOACTIVE SEED GUIDED PARTIAL MASTECTOMY/AXILLARY SENTINEL NODE BIOPSY/AXILLARY NODE DISSECTION Left 02/14/2016   Procedure: LEFT BREAST RADIOACTIVE SEED GUIDED LUMPECTOMY WITH LEFT  SENTINEL LYMPH NODE BIOPSY AND LEFT  SEED TARGETED DISSECTION;  Surgeon: Autumn Messing III, MD;  Location: [facility];  Service: General;  Laterality: Left;  . TUBAL LIGATION    . VAGINAL HYSTERECTOMY  1995    Current Medications: Current Meds  Medication Sig  . albuterol (VENTOLIN HFA) 108 (90 Base) MCG/ACT inhaler Inhale 2 puffs into the lungs every 6 (six) hours as needed for wheezing or shortness of breath.  . anastrozole (ARIMIDEX) 1 MG tablet TAKE 1 TABLET BY MOUTH EVERY DAY  . aspirin 81 MG tablet Take 81 mg by mouth daily.  . ferrous sulfate 325 (65 FE) MG tablet Take 325 mg by mouth 3 (three) times daily with meals.  . gabapentin (NEURONTIN) 800 MG tablet Take 800 mg by mouth 3 (three) times daily.  Marland Kitchen glipiZIDE (GLUCOTROL) 10 MG tablet Take 10 mg by mouth daily before  breakfast.  . losartan (COZAAR) 25 MG tablet Take 1 tablet (25 mg total) by mouth daily.  Marland Kitchen lovastatin (MEVACOR) 20 MG tablet Take 20 mg by mouth at bedtime.  . metFORMIN (GLUCOPHAGE) 500 MG tablet Take 500 mg by mouth 2 (two) times daily with a meal.   . Multiple Vitamin (MULTIVITAMIN) tablet Take 1 tablet by mouth daily.  . Omega-3 Fatty Acids (FISH OIL PO) Take 2 tablets by mouth. 2400mg  a day  . omeprazole (PRILOSEC) 20 MG capsule Take 20 mg by mouth daily.  . pioglitazone (ACTOS) 15 MG tablet Take 15 mg by mouth daily.  . Probiotic Product (PROBIOTIC DAILY PO) Take 1 tablet by mouth daily. Reported on 08/21/2015     Allergies:   No known allergies   Social History   Socioeconomic History  . Marital status: Widowed    Spouse name: Not on file  .  Number of children: 2  . Years of education: 58  . Highest education level: Not on file  Occupational History  . Not on file  Social Needs  . Financial resource strain: Not on file  . Food insecurity    Worry: Never true    Inability: Never true  . Transportation needs    Medical: No    Non-medical: No  Tobacco Use  . Smoking status: Never Smoker  . Smokeless tobacco: Never Used  Substance and Sexual Activity  . Alcohol use: No  . Drug use: No  . Sexual activity: Not Currently  Lifestyle  . Physical activity    Days per week: Not on file    Minutes per session: Not on file  . Stress: Not on file  Relationships  . Social Herbalist on phone: Not on file    Gets together: Not on file    Attends religious service: Not on file    Active member of club or organization: Not on file    Attends meetings of clubs or organizations: Not on file    Relationship status: Not on file  Other Topics Concern  . Not on file  Social History Narrative   Patient is divorced.   Patient has two children   Patient works in a clerical position.   Patient has a high school education.   Patient drinks caffeine rarely.     Family History: The patient's family history includes Alcoholism in her maternal grandfather; Congestive Heart Failure in her maternal grandmother; Diabetes in her mother; Hypertension in her mother.  ROS:   Please see the history of present illness.     All other systems reviewed and are negative.  EKGs/Labs/Other Studies Reviewed:    The following studies were reviewed today:   EKG:  EKG is ordered today.  The ekg ordered today demonstrates NSR, rate 95, low voltage  Recent Labs: 11/07/2018: ALT 16; BUN 12; Creatinine, Ser 0.85; Hemoglobin 10.0; Platelets 247; Potassium 3.7; Sodium 141  Recent Lipid Panel No results found for: CHOL, TRIG, HDL, CHOLHDL, VLDL, LDLCALC, LDLDIRECT  Physical Exam:    VS:  BP 113/73   Pulse 96   Ht 5\' 4"  (1.626 m)   Wt  189 lb (85.7 kg)   SpO2 96%   BMI 32.44 kg/m     Wt Readings from Last 3 Encounters:  12/20/18 189 lb (85.7 kg)  12/12/18 193 lb (87.5 kg)  11/07/18 190 lb 11.2 oz (86.5 kg)     GEN:  Well nourished, well developed in no acute distress HEENT:  Normal NECK: No JVD; No carotid bruits LYMPHATICS: No lymphadenopathy CARDIAC: RRR, no murmurs, rubs, gallops RESPIRATORY:  Clear to auscultation without rales, wheezing or rhonchi  ABDOMEN: Soft, non-tender, non-distended MUSCULOSKELETAL:  No edema; No deformity  SKIN: Warm and dry NEUROLOGIC:  Alert and oriented x 3 PSYCHIATRIC:  Normal affect   ASSESSMENT:    1. DOE (dyspnea on exertion)   2. Hyperlipidemia, unspecified hyperlipidemia type   3. Essential hypertension    PLAN:    In order of problems listed above:  Dyspnea on exertion: no evidence of heart failure or valvular disorder as etiology with unremarkable TTE, and BNP was normal .  Considering her age and risk factors (T2DM, HTN), could represent anginal equivalent - Stress MPI  HTN: appears controlled, on losartan 25 mg daily  HLD: on lovastatin 20 mg.  LDL 77 11/04/17  T2DM: on glipizide, actos, metformin.  A1c 7.3 12/15/18  Medication Adjustments/Labs and Tests Ordered: Current medicines are reviewed at length with the patient today.  Concerns regarding medicines are outlined above.  Orders Placed This Encounter  Procedures  . MYOCARDIAL PERFUSION IMAGING  . EKG 12-Lead   No orders of the defined types were placed in this encounter.   Patient Instructions  Medication Instructions:  Your Physician recommend you continue on your current medication as directed.    If you need a refill on your cardiac medications before your next appointment, please call your pharmacy.   Lab work: None  Testing/Procedures: Your physician has requested that you have a lexiscan myoview. For further information please visit [email or web address]. Please follow instruction sheet,  as given.    Follow-Up: Your physician recommends that you schedule a follow-up appointment as needed with Dr. Gardiner Rhyme.    You are scheduled for a Myocardial Perfusion Imaging Study.  Please arrive 15 minutes prior to your appointment time for registration and insurance purposes.  The test will take approximately 3 to 4 hours to complete; you may bring reading material.  If someone comes with you to your appointment, they will need to remain in the main lobby due to limited space in the testing area. **If you are pregnant or breastfeeding, please notify the nuclear lab prior to your appointment**  How to prepare for your Myocardial Perfusion Test: . Do not eat or drink 3 hours prior to your test, except you may have water. . Do not consume products containing caffeine (regular or decaffeinated) 12 hours prior to your test. (ex: coffee, chocolate, sodas, tea). . Do bring a list of your current medications with you.  If not listed below, you may take your medications as normal. . Do wear comfortable clothes (no dresses or overalls) and walking shoes, tennis shoes preferred (No heels or open toe shoes are allowed). . Do NOT wear cologne, perfume, aftershave, or lotions (deodorant is allowed). . If these instructions are not followed, your test will have to be rescheduled.  Please report to [address] for your test.  If you have questions or concerns about your appointment, you can call the Nuclear Lab at [phone].  If you cannot keep your appointment, please provide 24 hours notification to the Nuclear Lab, to avoid a possible $50 charge to your account.       Signed, Donato Heinz, MD  12/20/2018 10:10 PM    [facility]

## 2018-12-20 ENCOUNTER — Encounter: Payer: Self-pay | Admitting: Cardiology

## 2018-12-20 ENCOUNTER — Ambulatory Visit (INDEPENDENT_AMBULATORY_CARE_PROVIDER_SITE_OTHER): Payer: HMO | Admitting: Cardiology

## 2018-12-20 ENCOUNTER — Other Ambulatory Visit: Payer: Self-pay

## 2018-12-20 VITALS — BP 113/73 | HR 96 | Ht 64.0 in | Wt 189.0 lb

## 2018-12-20 DIAGNOSIS — R0600 Dyspnea, unspecified: Secondary | ICD-10-CM | POA: Diagnosis not present

## 2018-12-20 DIAGNOSIS — R0609 Other forms of dyspnea: Secondary | ICD-10-CM

## 2018-12-20 DIAGNOSIS — I10 Essential (primary) hypertension: Secondary | ICD-10-CM | POA: Diagnosis not present

## 2018-12-20 DIAGNOSIS — E785 Hyperlipidemia, unspecified: Secondary | ICD-10-CM

## 2018-12-20 NOTE — Patient Instructions (Signed)
Medication Instructions:  Your Physician recommend you continue on your current medication as directed.    If you need a refill on your cardiac medications before your next appointment, please call your pharmacy.   Lab work: None  Testing/Procedures: Your physician has requested that you have a lexiscan myoview. For further information please visit [email or web address]. Please follow instruction sheet, as given.    Follow-Up: Your physician recommends that you schedule a follow-up appointment as needed with Dr. Gardiner Rhyme.    You are scheduled for a Myocardial Perfusion Imaging Study.  Please arrive 15 minutes prior to your appointment time for registration and insurance purposes.  The test will take approximately 3 to 4 hours to complete; you may bring reading material.  If someone comes with you to your appointment, they will need to remain in the main lobby due to limited space in the testing area. **If you are pregnant or breastfeeding, please notify the nuclear lab prior to your appointment**  How to prepare for your Myocardial Perfusion Test: . Do not eat or drink 3 hours prior to your test, except you may have water. . Do not consume products containing caffeine (regular or decaffeinated) 12 hours prior to your test. (ex: coffee, chocolate, sodas, tea). . Do bring a list of your current medications with you.  If not listed below, you may take your medications as normal. . Do wear comfortable clothes (no dresses or overalls) and walking shoes, tennis shoes preferred (No heels or open toe shoes are allowed). . Do NOT wear cologne, perfume, aftershave, or lotions (deodorant is allowed). . If these instructions are not followed, your test will have to be rescheduled.  Please report to [address] for your test.  If you have questions or concerns about your appointment, you can call the Nuclear Lab at [phone].  If you cannot keep your appointment, please provide  24 hours notification to the Nuclear Lab, to avoid a possible $50 charge to your account.

## 2018-12-21 ENCOUNTER — Telehealth: Payer: Self-pay | Admitting: Pulmonary Disease

## 2018-12-22 ENCOUNTER — Telehealth (HOSPITAL_COMMUNITY): Payer: Self-pay | Admitting: *Deleted

## 2018-12-22 NOTE — Telephone Encounter (Signed)
Sched for 10/16 @ 2:45.  Per pt, nothing further needed at this time.

## 2018-12-22 NOTE — Telephone Encounter (Signed)
Pt returning call to schedule a covid test.  Please call pt ASAP due to other appointments and pt being out of town on Monday. [phone] or [phone].

## 2018-12-22 NOTE — Telephone Encounter (Signed)
Patient given detailed instructions per Myocardial Perfusion Study Information Sheet for the test on 12/23/18 at 7:45. Patient notified to arrive 15 minutes early and that it is imperative to arrive on time for appointment to keep from having the test rescheduled.  If you need to cancel or reschedule your appointment, please call the office within 24 hours of your appointment. . Patient verbalized understanding.Kristine Parrish

## 2018-12-23 ENCOUNTER — Ambulatory Visit: Payer: HMO | Admitting: Cardiovascular Disease

## 2018-12-23 ENCOUNTER — Other Ambulatory Visit (HOSPITAL_COMMUNITY)
Admission: RE | Admit: 2018-12-23 | Discharge: 2018-12-23 | Disposition: A | Discharge status: Home / Self Care | Payer: HMO | Source: Ambulatory Visit | Attending: Pulmonary Disease | Admitting: Pulmonary Disease

## 2018-12-23 ENCOUNTER — Encounter (HOSPITAL_COMMUNITY): Payer: HMO

## 2018-12-23 ENCOUNTER — Other Ambulatory Visit: Payer: Self-pay

## 2018-12-23 ENCOUNTER — Ambulatory Visit (HOSPITAL_BASED_OUTPATIENT_CLINIC_OR_DEPARTMENT_OTHER): Payer: HMO

## 2018-12-23 VITALS — Ht 64.0 in | Wt 189.0 lb

## 2018-12-23 DIAGNOSIS — Z20828 Contact with and (suspected) exposure to other viral communicable diseases: Secondary | ICD-10-CM | POA: Diagnosis not present

## 2018-12-23 DIAGNOSIS — R0600 Dyspnea, unspecified: Secondary | ICD-10-CM | POA: Insufficient documentation

## 2018-12-23 DIAGNOSIS — R0609 Other forms of dyspnea: Secondary | ICD-10-CM

## 2018-12-23 LAB — MYOCARDIAL PERFUSION IMAGING
LV dias vol: 68 mL (ref 46–106)
LV sys vol: 18 mL
Peak HR: 104 {beats}/min
Rest HR: 78 {beats}/min
SDS: 8
SRS: 0
SSS: 8
TID: 1.03

## 2018-12-23 MED ORDER — REGADENOSON 0.4 MG/5ML IV SOLN
0.4000 mg | Freq: Once | INTRAVENOUS | Status: AC
  Administered 2018-12-23: 0.4 mg via INTRAVENOUS

## 2018-12-23 MED ORDER — TECHNETIUM TC 99M TETROFOSMIN IV KIT
31.4000 | PACK | Freq: Once | INTRAVENOUS | Status: AC | PRN
  Administered 2018-12-23: 31.4 via INTRAVENOUS
  Filled 2018-12-23: qty 32

## 2018-12-23 MED ORDER — TECHNETIUM TC 99M TETROFOSMIN IV KIT
10.5000 | PACK | Freq: Once | INTRAVENOUS | Status: AC | PRN
  Administered 2018-12-23: 10.5 via INTRAVENOUS
  Filled 2018-12-23: qty 11

## 2018-12-24 LAB — NOVEL CORONAVIRUS, NAA (HOSP ORDER, SEND-OUT TO REF LAB; TAT 18-24 HRS): SARS-CoV-2, NAA: NOT DETECTED

## 2018-12-27 ENCOUNTER — Other Ambulatory Visit: Payer: Self-pay

## 2018-12-27 ENCOUNTER — Ambulatory Visit (INDEPENDENT_AMBULATORY_CARE_PROVIDER_SITE_OTHER): Payer: HMO | Admitting: Pulmonary Disease

## 2018-12-27 ENCOUNTER — Telehealth: Payer: Self-pay | Admitting: Cardiology

## 2018-12-27 DIAGNOSIS — R0602 Shortness of breath: Secondary | ICD-10-CM | POA: Diagnosis not present

## 2018-12-27 LAB — PULMONARY FUNCTION TEST
DL/VA % pred: 104 %
DL/VA: 4.29 ml/min/mmHg/L
DLCO unc % pred: 71 %
DLCO unc: 13.68 ml/min/mmHg
FEF 25-75 Post: 2.3 L/sec
FEF 25-75 Pre: 1.7 L/sec
FEF2575-%Change-Post: 35 %
FEF2575-%Pred-Post: 134 %
FEF2575-%Pred-Pre: 99 %
FEV1-%Change-Post: 6 %
FEV1-%Pred-Post: 77 %
FEV1-%Pred-Pre: 72 %
FEV1-Post: 1.66 L
FEV1-Pre: 1.55 L
FEV1FVC-%Change-Post: 5 %
FEV1FVC-%Pred-Pre: 108 %
FEV6-%Change-Post: 1 %
FEV6-%Pred-Post: 70 %
FEV6-%Pred-Pre: 70 %
FEV6-Post: 1.93 L
FEV6-Pre: 1.9 L
FEV6FVC-%Change-Post: 0 %
FEV6FVC-%Pred-Post: 104 %
FEV6FVC-%Pred-Pre: 105 %
FVC-%Change-Post: 1 %
FVC-%Pred-Post: 67 %
FVC-%Pred-Pre: 66 %
FVC-Post: 1.93 L
FVC-Pre: 1.9 L
Post FEV1/FVC ratio: 86 %
Post FEV6/FVC ratio: 100 %
Pre FEV1/FVC ratio: 82 %
Pre FEV6/FVC Ratio: 100 %
RV % pred: 72 %
RV: 1.66 L
TLC % pred: 68 %
TLC: 3.49 L

## 2018-12-27 NOTE — Telephone Encounter (Signed)
New message   Patient is  Returning call to get myocardial results. Please call.

## 2018-12-27 NOTE — Telephone Encounter (Signed)
Pt updated with test results and voiced understanding.

## 2018-12-27 NOTE — Progress Notes (Signed)
PFT done today. 

## 2018-12-27 NOTE — Telephone Encounter (Signed)
Patient calling back for results

## 2019-01-04 ENCOUNTER — Ambulatory Visit: Payer: HMO | Admitting: Cardiology

## 2019-01-15 ENCOUNTER — Other Ambulatory Visit: Payer: Self-pay | Admitting: Oncology

## 2019-01-16 ENCOUNTER — Ambulatory Visit: Payer: HMO | Admitting: Pulmonary Disease

## 2019-01-16 ENCOUNTER — Other Ambulatory Visit: Payer: Self-pay

## 2019-01-16 ENCOUNTER — Encounter: Payer: Self-pay | Admitting: Pulmonary Disease

## 2019-01-16 VITALS — BP 126/62 | HR 78 | Ht 64.0 in | Wt 188.6 lb

## 2019-01-16 DIAGNOSIS — R0602 Shortness of breath: Secondary | ICD-10-CM

## 2019-01-16 DIAGNOSIS — R942 Abnormal results of pulmonary function studies: Secondary | ICD-10-CM

## 2019-01-16 MED ORDER — BREO ELLIPTA 100-25 MCG/INH IN AEPB: 1.0000 | INHALATION_SPRAY | Freq: Every day | RESPIRATORY_TRACT | 3 refills | Status: DC

## 2019-01-16 MED ORDER — BREO ELLIPTA 100-25 MCG/INH IN AEPB: 1.0000 | INHALATION_SPRAY | Freq: Every day | RESPIRATORY_TRACT | 0 refills | Status: DC

## 2019-01-16 NOTE — Patient Instructions (Signed)
Shortness of breath  We will try Breo 100 Use daily  Continue graded exercises  I will see you back in the office in 3 months  Call with significant concerns

## 2019-01-16 NOTE — Progress Notes (Signed)
Subjective:     Patient ID: Kristine Parrish, female   DOB: Dec 06, 1945, 73 y.o.   MRN: [id number]  Patient is being seen for shortness of breath on exertion  Breathing is not any different Still short of breath with mild to moderate activity  History of breast cancer for which she had chemotherapy about 3 years ago Did receive radiation treatments  She has been having shortness of breath for about 6 months  History of Charcot foot Has been trying to stay off the foot May require surgery  Shortness of breath going up steps Shortness of breath walking on level ground  She has no occupational predisposition to lung disease  No history of pneumonia No wheezing Does not smoke  Past Medical History:  Diagnosis Date  . Arthritis    "hands "  . Breast cancer of upper-outer quadrant of left female breast ([facility]) 08/15/2015  . Colon polyps   . Dyspnea    with exertion- "from chemo"  . GERD (gastroesophageal reflux disease)   . Hiatal hernia   . History of bronchitis   . History of radiation therapy 05/19/16- 06/29/16   Left Breast 50 Gy in 25 fractions, SCLV/PAB 45 Gy in 25 fractions, Left Breast boost 10 Gy in 5 fractions.   . Hypertension   . Neuropathy    from chemopathy  . Peptic ulcer disease   . Peripheral neuropathy   . Personal history of chemotherapy   . Personal history of radiation therapy   . Type 2 diabetes mellitus (HCC)    Type II   Social History   Socioeconomic History  . Marital status: Widowed    Spouse name: Not on file  . Number of children: 2  . Years of education: 69  . Highest education level: Not on file  Occupational History  . Not on file  Social Needs  . Financial resource strain: Not on file  . Food insecurity    Worry: Never true    Inability: Never true  . Transportation needs    Medical: No    Non-medical: No  Tobacco Use  . Smoking status: Never Smoker  . Smokeless tobacco: Never Used  Substance and Sexual Activity  . Alcohol use:  No  . Drug use: No  . Sexual activity: Not Currently  Lifestyle  . Physical activity    Days per week: Not on file    Minutes per session: Not on file  . Stress: Not on file  Relationships  . Social Herbalist on phone: Not on file    Gets together: Not on file    Attends religious service: Not on file    Active member of club or organization: Not on file    Attends meetings of clubs or organizations: Not on file    Relationship status: Not on file  . Intimate partner violence    Fear of current or ex partner: Not on file    Emotionally abused: Not on file    Physically abused: Not on file    Forced sexual activity: Not on file  Other Topics Concern  . Not on file  Social History Narrative   Patient is divorced.   Patient has two children   Patient works in a clerical position.   Patient has a high school education.   Patient drinks caffeine rarely.   Family History  Problem Relation Age of Onset  . Diabetes Mother   . Hypertension Mother   .  Alcoholism Maternal Grandfather   . Congestive Heart Failure Maternal Grandmother     Review of Systems  Constitutional: Negative for fever and unexpected weight change.  HENT: Negative for congestion, dental problem, ear pain, nosebleeds, postnasal drip, rhinorrhea, sinus pressure, sneezing, sore throat and trouble swallowing.   Eyes: Negative for redness and itching.  Respiratory: Positive for shortness of breath. Negative for cough, chest tightness and wheezing.   Cardiovascular: Negative for palpitations and leg swelling.  Gastrointestinal: Negative for nausea and vomiting.  Genitourinary: Negative for dysuria.  Musculoskeletal: Negative for joint swelling.  Skin: Negative for rash.  Allergic/Immunologic: Negative.  Negative for environmental allergies, food allergies and immunocompromised state.  Neurological: Negative for headaches.  Hematological: Does not bruise/bleed easily.  Psychiatric/Behavioral: Negative  for dysphoric mood. The patient is not nervous/anxious.       Objective:   Physical Exam Constitutional:      General: She is not in acute distress.    Appearance: Normal appearance. She is not ill-appearing.  HENT:     Head: Normocephalic and atraumatic.     Mouth/Throat:     Mouth: Mucous membranes are moist.     Pharynx: No oropharyngeal exudate.  Eyes:     General:        Right eye: No discharge.     Extraocular Movements: Extraocular movements intact.     Pupils: Pupils are equal, round, and reactive to light.  Neck:     Musculoskeletal: Normal range of motion and neck supple. No neck rigidity or muscular tenderness.  Cardiovascular:     Rate and Rhythm: Normal rate and regular rhythm.     Heart sounds: No murmur. No friction rub.  Pulmonary:     Effort: Pulmonary effort is normal. No respiratory distress.     Breath sounds: Normal breath sounds. No stridor. No wheezing, rhonchi or rales.  Abdominal:     General: There is no distension.  Neurological:     Mental Status: She is alert.    Vitals:   01/16/19 1146  BP: 126/62  Pulse: 78  SpO2: 97%      CT scan of the chest reviewed by myself showing a mosaic pattern, some air trapping  PFT shows significant bronchodilator response in the small airways  Echocardiogram reveals diastolic dysfunction  Assessment:     Shortness of breath -Multifactorial -Possible small airway disease -Diastolic dysfunction -Deconditioning -No occupational predisposition, no significant exposure to secondhand smoke  History of diastolic dysfunction -Echocardiogram was reviewed showing diastolic dysfunction  Deconditioning -Has been trying to be active -She does have a Charcot foot that prevents significant activity  Multifactorial reasons for shortness of breath  History of breast cancer     Plan:     Physical activity as tolerated  Continue albuterol as needed  We will give her a sample of Breo to be  used  Prescription for [facility] will be sent   Shortness of breath is multifactorial and physical activity generally will help  Encouraged to call with any significant concerns  I will see her back in the office in about 3 months

## 2019-01-23 ENCOUNTER — Ambulatory Visit: Payer: HMO | Admitting: Pulmonary Disease

## 2019-04-05 DIAGNOSIS — Z961 Presence of intraocular lens: Secondary | ICD-10-CM | POA: Diagnosis not present

## 2019-04-05 DIAGNOSIS — E119 Type 2 diabetes mellitus without complications: Secondary | ICD-10-CM | POA: Diagnosis not present

## 2019-04-05 DIAGNOSIS — H353131 Nonexudative age-related macular degeneration, bilateral, early dry stage: Secondary | ICD-10-CM | POA: Diagnosis not present

## 2019-04-05 DIAGNOSIS — H18513 Endothelial corneal dystrophy, bilateral: Secondary | ICD-10-CM | POA: Diagnosis not present

## 2019-05-06 NOTE — Progress Notes (Signed)
[facility]  Telephone:([phone]) [phone] Fax:([phone]) [phone]     ID: Kristine Parrish DOB: [birth year]  MR#: [id number]  UYE#:[id number]  Patient Care Team: Orpah Melter, MD as PCP - General (Family Medicine) Jovita Kussmaul, MD as Consulting Physician (General Surgery) Kristine Parrish, Virgie Dad, MD as Consulting Physician (Oncology) Eppie Gibson, MD as Attending Physician (Radiation Oncology) Murvin Donning, MD (Dermatology) Laurence Spates, MD (Inactive) as Consulting Physician (Gastroenterology) Rosemary Holms, DPM as Consulting Physician (Podiatry) Maisie Fus, MD as Consulting Physician (Obstetrics and Gynecology) Larey Dresser, MD as Consulting Physician (Cardiology) Wylene Simmer, MD as Consulting Physician (Orthopedic Surgery) Dimitri Ped, RN as [facility] Laurin Coder, MD as Consulting Physician (Pulmonary Disease) OTHER MD:  CHIEF COMPLAINT: Estrogen receptor positive breast cancer  CURRENT TREATMENT: anastrozole   INTERVAL HISTORY: Kristine Parrish returns today for follow-up of her estrogen receptor positive breast cancer.   She continues on anastrozole.  She has no hot flashes or other side effects from this medication that she is aware of.  Kristine Parrish's last bone density screening on 06/24/2016, showed a T-score of -0.7, which is considered normal.    Since her last visit, she underwent workup for shortness of breath. She was referred to Dr. Ander Slade in pulmonology on 12/12/2018. Echocardiogram performed on 68/05/7288 showed diastolic dysfunction and an ejection fraction on 55-60%. A pulmonary function test was performed on 12/27/2018 and showed significant bronchodilator response in small airways.  She was started on Breo and tells me her breathing is significantly improved  Her most recent mammography was on 10/24/2018 at [facility].   REVIEW OF SYSTEMS: Kristine Parrish cannot really do much in terms of walking because of her  Charcot foot.  She does have a reclining bike at home that she uses sometimes.  She uses a cane again at times.  She has had no unusual headaches, sudden visual changes, or falls.  She had the first COVID-19 dose a week ago and she tolerated it fine.  She tells me her blood sugars are well controlled.  A detailed review of systems today was otherwise stable   BREAST CANCER HISTORY: From the original intake note  Kristine Parrish herself noted a change in her left breast and brought it to the attention of Dr. Nori Riis, who set her up for bilateral diagnostic mammography with tomography and left breast ultrasonography at the [facility] 08/09/2015. The breast density was category D. In the area of palpable concern there was a mass with irregular margins and architectural distortion, measuring approximately 3 cm. This was palpable in the upper outer quadrant near the nipple. The ultrasound confirmed an irregular hypoechoic mass approximately 3 cm from the nipple at the 10:00 position measuring 2.7 cm. Ultrasound of the left axilla found a single level I left axillary node with focal cortical thickening.  On 08/13/2015 the patient underwent biopsy of the left breast mass and the suspicious left axillary lymph node, both showing invasive ductal carcinoma, grade 2 or 3, estrogen receptor 100% positive, progesterone receptor 100% positive, both with strong staining intensity, with an MIB-1 of 50%, and HER-2 amplification, the signals ratio being 2.2 to and the number per cell 3.55  The patient's subsequent history is as detailed below   PAST MEDICAL HISTORY: Past Medical History:  Diagnosis Date  . Arthritis    "hands "  . Breast cancer of upper-outer quadrant of left female breast ([facility]) 08/15/2015  . Colon polyps   . Dyspnea  with exertion- "from chemo"  . GERD (gastroesophageal reflux disease)   . Hiatal hernia   . History of bronchitis   . History of radiation therapy 05/19/16- 06/29/16   Left Breast 50 Gy in  25 fractions, SCLV/PAB 45 Gy in 25 fractions, Left Breast boost 10 Gy in 5 fractions.   . Hypertension   . Neuropathy    from chemopathy  . Peptic ulcer disease   . Peripheral neuropathy   . Personal history of chemotherapy   . Personal history of radiation therapy   . Type 2 diabetes mellitus ([facility] [facility])    Type II    PAST SURGICAL HISTORY: Past Surgical History:  Procedure Laterality Date  . APPENDECTOMY  1974  . BREAST LUMPECTOMY Left 02/14/2016  . cataract surgery Bilateral 04/2012  . CHOLECYSTECTOMY  1996  . COLONOSCOPY    . FOOT SURGERY     left bunion removed  . nerve removed from left foot  2000  . PORTACATH PLACEMENT N/A 09/05/2015   Procedure: INSERTION PORT-A-CATH;  Surgeon: Autumn Messing III, MD;  Location: [facility];  Service: General;  Laterality: N/A;  . RADIOACTIVE SEED GUIDED PARTIAL MASTECTOMY/AXILLARY SENTINEL NODE BIOPSY/AXILLARY NODE DISSECTION Left 02/14/2016   Procedure: LEFT BREAST RADIOACTIVE SEED GUIDED LUMPECTOMY WITH LEFT  SENTINEL LYMPH NODE BIOPSY AND LEFT  SEED TARGETED DISSECTION;  Surgeon: Autumn Messing III, MD;  Location: [facility];  Service: General;  Laterality: Left;  . TUBAL LIGATION    . VAGINAL HYSTERECTOMY  1995    FAMILY HISTORY Family History  Problem Relation Age of Onset  . Diabetes Mother   . Hypertension Mother   . Alcoholism Maternal Grandfather   . Congestive Heart Failure Maternal Grandmother   The patient has little information about her father and is not sure of his cause of death or age at death. The patient's mother died at age 28. The patient had no siblings. She was raised by her grandmother. She is not aware of any breast or ovarian cancer history in the family    GYNECOLOGIC HISTORY:  No LMP recorded. Patient has had a hysterectomy.  menarche age 76, first live birth age 54. The patient is GX P2. She underwent hysterectomy with bilateral salpingo-oophorectomy in 1994, and has been on estrogen replacement since that time, discontinued June  2017.    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  She is divorced and currently lives next door to her daughter, [name], who lives in [place] and works as a Teacher, music for the [employer]. Son, Cerissa Zeiger lives in [place] where he is Secondary school teacher. The patient has one granddaughter, 36 years old as of August 2019.  The patient attends [employer]. The patient's daughter [name] is her healthcare power of attorney.    HEALTH MAINTENANCE: Social History   Tobacco Use  . Smoking status: Never Smoker  . Smokeless tobacco: Never Used  Substance Use Topics  . Alcohol use: No  . Drug use: No     Colonoscopy: 2012/[facility]  PAP:  Bone density:  06/24/2016 shows a T score of -0.7 (normal)  Lipid panel:  Allergies  Allergen Reactions  . No Known Allergies     Current Outpatient Medications  Medication Sig Dispense Refill  . albuterol (VENTOLIN HFA) 108 (90 Base) MCG/ACT inhaler Inhale 2 puffs into the lungs every 6 (six) hours as  needed for wheezing or shortness of breath. 18 g 6  . anastrozole (ARIMIDEX) 1 MG tablet TAKE 1 TABLET BY MOUTH EVERY DAY 30 tablet 5  . aspirin 81 MG tablet Take 81 mg by mouth daily.    . ferrous sulfate 325 (65 FE) MG tablet Take 325 mg by mouth 3 (three) times daily with meals.    . fluticasone furoate-vilanterol (BREO ELLIPTA) 100-25 MCG/INH AEPB Inhale 1 puff into the lungs daily. 60 each 3  . fluticasone furoate-vilanterol (BREO ELLIPTA) 100-25 MCG/INH AEPB Inhale 1 puff into the lungs daily. 14 each 0  . gabapentin (NEURONTIN) 800 MG tablet Take 800 mg by mouth 3 (three) times daily.    Marland Kitchen glipiZIDE (GLUCOTROL) 10 MG tablet Take 10 mg by mouth daily before breakfast.    . losartan (COZAAR) 25 MG tablet Take 1 tablet (25 mg total) by mouth daily. 30 tablet 6  . lovastatin (MEVACOR) 20 MG tablet Take 20  mg by mouth at bedtime.    . metFORMIN (GLUCOPHAGE) 500 MG tablet Take 500 mg by mouth 2 (two) times daily with a meal.     . Multiple Vitamin (MULTIVITAMIN) tablet Take 1 tablet by mouth daily.    . Omega-3 Fatty Acids (FISH OIL PO) Take 2 tablets by mouth. 2443m a day    . omeprazole (PRILOSEC) 20 MG capsule Take 20 mg by mouth daily.    . pioglitazone (ACTOS) 15 MG tablet Take 15 mg by mouth daily.    . Probiotic Product (PROBIOTIC DAILY PO) Take 1 tablet by mouth daily. Reported on 08/21/2015     No current facility-administered medications for this visit.    OBJECTIVE: Middle-aged white Parrish in no acute distress Vitals:   05/08/19 1004  BP: 131/80  Pulse: 76  Resp: 18  Temp: 97.8 F (36.6 C)  SpO2: 100%   Wt Readings from Last 3 Encounters:  05/08/19 181 lb 3.2 oz (82.2 kg)  01/16/19 188 lb 9.6 oz (85.5 kg)  12/23/18 189 lb (85.7 kg)   Body mass index is 31.1 kg/m.    ECOG FS:2 - Symptomatic, <50% confined to bed  Sclerae unicteric, EOMs intact Wearing a mask No cervical or supraclavicular adenopathy Lungs no rales or rhonchi, no wheezes appreciated Heart regular rate and rhythm Abd soft, obese, nontender, positive bowel sounds MSK no focal spinal tenderness, no upper extremity lymphedema Neuro: nonfocal, well oriented, appropriate affect Breasts: The right breast is unremarkable.  The left breast is status post lumpectomy and radiation.  There is no evidence of local recurrence.  Both axillae are benign.  Recall she does have a history of seroma in the left axilla   LAB RESULTS:  CMP     Component Value Date/Time   NA 141 11/07/2018 1320   NA 140 12/28/2016 1043   K 3.7 11/07/2018 1320   K 4.1 12/28/2016 1043   CL 105 11/07/2018 1320   CO2 24 11/07/2018 1320   CO2 26 12/28/2016 1043   GLUCOSE 196 (H) 11/07/2018 1320   GLUCOSE 173 (H) 12/28/2016 1043   BUN 12 11/07/2018 1320   BUN 16.9 12/28/2016 1043   CREATININE 0.85 11/07/2018 1320   CREATININE 0.9  12/28/2016 1043   CALCIUM 8.9 11/07/2018 1320   CALCIUM 9.3 12/28/2016 1043   PROT 7.0 11/07/2018 1320   PROT 7.0 12/28/2016 1043   ALBUMIN 3.8 11/07/2018 1320   ALBUMIN 3.6 12/28/2016 1043   AST 15 11/07/2018 1320   AST 17 12/28/2016 1043  ALT 16 11/07/2018 1320   ALT 17 12/28/2016 1043   ALKPHOS 132 (H) 11/07/2018 1320   ALKPHOS 158 (H) 12/28/2016 1043   BILITOT 0.3 11/07/2018 1320   BILITOT 0.36 12/28/2016 1043   GFRNONAA >60 11/07/2018 1320   GFRAA >60 11/07/2018 1320    INo results found for: SPEP, UPEP  Lab Results  Component Value Date   WBC 4.8 05/08/2019   NEUTROABS 3.3 05/08/2019   HGB 11.7 (L) 05/08/2019   HCT 38.2 05/08/2019   MCV 91.2 05/08/2019   PLT 213 05/08/2019      Chemistry      Component Value Date/Time   NA 141 11/07/2018 1320   NA 140 12/28/2016 1043   K 3.7 11/07/2018 1320   K 4.1 12/28/2016 1043   CL 105 11/07/2018 1320   CO2 24 11/07/2018 1320   CO2 26 12/28/2016 1043   BUN 12 11/07/2018 1320   BUN 16.9 12/28/2016 1043   CREATININE 0.85 11/07/2018 1320   CREATININE 0.9 12/28/2016 1043      Component Value Date/Time   CALCIUM 8.9 11/07/2018 1320   CALCIUM 9.3 12/28/2016 1043   ALKPHOS 132 (H) 11/07/2018 1320   ALKPHOS 158 (H) 12/28/2016 1043   AST 15 11/07/2018 1320   AST 17 12/28/2016 1043   ALT 16 11/07/2018 1320   ALT 17 12/28/2016 1043   BILITOT 0.3 11/07/2018 1320   BILITOT 0.36 12/28/2016 1043     No results found for: LABCA2  No components found for: LABCA125  No results for input(s): INR in the last 168 hours.  Urinalysis No results found for: COLORURINE, APPEARANCEUR, LABSPEC, PHURINE, GLUCOSEU, HGBUR, BILIRUBINUR, KETONESUR, PROTEINUR, UROBILINOGEN, NITRITE, LEUKOCYTESUR   STUDIES: No results found.   ELIGIBLE FOR AVAILABLE RESEARCH PROTOCOL: Not eligible for PALLAS because HER-2 positive  ASSESSMENT: 74 y.o. Kristine Parrish status post left breast upper outer quadrant and left axillary lymph node biopsy  08/13/2015 both positive for an invasive ductal carcinoma, grade 2 or 3, triple positive, with an MIB-1 of 50%  (1) neoadjuvant chemotherapy consisting of carboplatin, docetaxel, trastuzumab and pertuzumab given every 21 days Started 09/11/2015  (a) treatment changed to Abraxane and trastuzumab with cycle 2 because of side effects after cycle 1  (b) Abraxane discontinued after 3d dose (10/17/2015) because of peripheral neuropathy  (c) cyclophosphamide, methotrexate and fluorouracil (CMF) started 11/07/2015, 4 cycles, last dose 01/09/2016   (2) trastuzumab continued to complete a year (last dose September 24, 2016)  (a) final echocardiogram 07/23/2016 showed a well-preserved ejection fraction at 55-60%  (3) status post left lumpectomy with targeted axillary dissection 02/14/2016 showing a residual pT1c pN1 invasive ductal carcinoma, grade 2, estrogen and progesterone receptor positive, now HER-2 not amplified; margins were negative  (a) left axillary seroma/abscess drained surgically 03/25/2016, but persists  (4) adjuvant radiation 05/19/16 - 06/29/16 Site/dose:    Left breast - 4 field             Left breast: 50 Gy in 25 fractions             SCLV/PAB: 45 Gy in 25 fractions Left breast boost: 10 GY in 5 fractions  (5) anastrozole started 07/07/2016  (a) bone density 06/24/2016 shows a T score of -0.7 (normal)  (b) density August 2021   PLAN: Kristine Parrish is now just over 3 years out from definitive surgery for her breast cancer with no evidence of disease recurrence.  This is very favorable.  She is tolerating anastrozole well and the plan will  be to continue that a total of 5 years.  Her breathing is improved on Breo and she is trying to do a little bit of exercise on the bike despite her Charcot foot.  She is trying to get her blood sugars sufficiently controlled that she might be able eventually to have surgery to that foot.  She is enjoying retirement and living next to her daughter, and her  own place however.  She is undergoing Covid vaccination so far without event  She will have mammography and a bone density in August.  She will return to see me in 1 year.  She knows to call for any other issue that may develop before that visit.  Total encounter time 30 minutes.*  Darshana Curnutt, Virgie Dad, MD  05/08/19 10:23 AM Medical Oncology and Hematology [facility] [place], [place] [address] Tel. [phone]    Fax. [phone]   I, Wilburn Mylar, am acting as scribe for Dr. Virgie Dad. Eddrick Dilone.  I, Lurline Del MD, have reviewed the above documentation for accuracy and completeness, and I agree with the above.    *Total Encounter Time as defined by the Centers for Medicare and Medicaid Services includes, in addition to the face-to-face time of a patient visit (documented in the note above) non-face-to-face time: obtaining and reviewing outside history, ordering and reviewing medications, tests or procedures, care coordination (communications with other health care professionals or caregivers) and documentation in the medical record.

## 2019-05-08 ENCOUNTER — Other Ambulatory Visit: Payer: Self-pay

## 2019-05-08 ENCOUNTER — Inpatient Hospital Stay: Payer: HMO | Attending: Oncology | Admitting: Oncology

## 2019-05-08 ENCOUNTER — Inpatient Hospital Stay: Payer: HMO

## 2019-05-08 VITALS — BP 131/80 | HR 76 | Temp 97.8°F | Resp 18 | Ht 64.0 in | Wt 181.2 lb

## 2019-05-08 DIAGNOSIS — Z170 Estrogen receptor positive status [ER+]: Secondary | ICD-10-CM

## 2019-05-08 DIAGNOSIS — R739 Hyperglycemia, unspecified: Secondary | ICD-10-CM

## 2019-05-08 DIAGNOSIS — Z9079 Acquired absence of other genital organ(s): Secondary | ICD-10-CM | POA: Insufficient documentation

## 2019-05-08 DIAGNOSIS — M14671 Charcot's joint, right ankle and foot: Secondary | ICD-10-CM | POA: Insufficient documentation

## 2019-05-08 DIAGNOSIS — Z9221 Personal history of antineoplastic chemotherapy: Secondary | ICD-10-CM | POA: Insufficient documentation

## 2019-05-08 DIAGNOSIS — Z90722 Acquired absence of ovaries, bilateral: Secondary | ICD-10-CM | POA: Diagnosis not present

## 2019-05-08 DIAGNOSIS — R5381 Other malaise: Secondary | ICD-10-CM | POA: Diagnosis not present

## 2019-05-08 DIAGNOSIS — Z90710 Acquired absence of both cervix and uterus: Secondary | ICD-10-CM | POA: Diagnosis not present

## 2019-05-08 DIAGNOSIS — Z923 Personal history of irradiation: Secondary | ICD-10-CM | POA: Diagnosis not present

## 2019-05-08 DIAGNOSIS — E119 Type 2 diabetes mellitus without complications: Secondary | ICD-10-CM | POA: Diagnosis not present

## 2019-05-08 DIAGNOSIS — C50412 Malignant neoplasm of upper-outer quadrant of left female breast: Secondary | ICD-10-CM

## 2019-05-08 DIAGNOSIS — E11610 Type 2 diabetes mellitus with diabetic neuropathic arthropathy: Secondary | ICD-10-CM | POA: Insufficient documentation

## 2019-05-08 DIAGNOSIS — Z79811 Long term (current) use of aromatase inhibitors: Secondary | ICD-10-CM | POA: Insufficient documentation

## 2019-05-08 LAB — CBC WITH DIFFERENTIAL/PLATELET
Abs Immature Granulocytes: 0.02 10*3/uL (ref 0.00–0.07)
Basophils Absolute: 0 10*3/uL (ref 0.0–0.1)
Basophils Relative: 0 %
Eosinophils Absolute: 0.2 10*3/uL (ref 0.0–0.5)
Eosinophils Relative: 5 %
HCT: 38.2 % (ref 36.0–46.0)
Hemoglobin: 11.7 g/dL — ABNORMAL LOW (ref 12.0–15.0)
Immature Granulocytes: 0 %
Lymphocytes Relative: 17 %
Lymphs Abs: 0.8 10*3/uL (ref 0.7–4.0)
MCH: 27.9 pg (ref 26.0–34.0)
MCHC: 30.6 g/dL (ref 30.0–36.0)
MCV: 91.2 fL (ref 80.0–100.0)
Monocytes Absolute: 0.4 10*3/uL (ref 0.1–1.0)
Monocytes Relative: 7 %
Neutro Abs: 3.3 10*3/uL (ref 1.7–7.7)
Neutrophils Relative %: 71 %
Platelets: 213 10*3/uL (ref 150–400)
RBC: 4.19 MIL/uL (ref 3.87–5.11)
RDW: 16.1 % — ABNORMAL HIGH (ref 11.5–15.5)
WBC: 4.8 10*3/uL (ref 4.0–10.5)
nRBC: 0 % (ref 0.0–0.2)

## 2019-05-08 LAB — COMPREHENSIVE METABOLIC PANEL
ALT: 13 U/L (ref 0–44)
AST: 13 U/L — ABNORMAL LOW (ref 15–41)
Albumin: 3.5 g/dL (ref 3.5–5.0)
Alkaline Phosphatase: 134 U/L — ABNORMAL HIGH (ref 38–126)
Anion gap: 8 (ref 5–15)
BUN: 12 mg/dL (ref 8–23)
CO2: 28 mmol/L (ref 22–32)
Calcium: 8.9 mg/dL (ref 8.9–10.3)
Chloride: 107 mmol/L (ref 98–111)
Creatinine, Ser: 0.79 mg/dL (ref 0.44–1.00)
GFR calc Af Amer: >60 mL/min (ref >60)
GFR calc non Af Amer: >60 mL/min (ref >60)
Glucose, Bld: 97 mg/dL (ref 70–99)
Potassium: 4.3 mmol/L (ref 3.5–5.1)
Sodium: 143 mmol/L (ref 135–145)
Total Bilirubin: 0.3 mg/dL (ref 0.3–1.2)
Total Protein: 6.6 g/dL (ref 6.5–8.1)

## 2019-05-09 ENCOUNTER — Telehealth: Payer: Self-pay | Admitting: Oncology

## 2019-05-09 ENCOUNTER — Ambulatory Visit: Payer: Self-pay

## 2019-05-09 NOTE — Telephone Encounter (Signed)
I left a message regarding schedule  

## 2019-05-15 ENCOUNTER — Other Ambulatory Visit: Payer: Self-pay | Admitting: Pulmonary Disease

## 2019-05-23 ENCOUNTER — Other Ambulatory Visit: Payer: Self-pay

## 2019-05-23 NOTE — Patient Outreach (Signed)
  [facility]) Care Management Chronic Special Needs Program  05/23/2019  Name: DOMINICK ZERTUCHE DOB: Jul 07, 1945  MRN: [id number]  Ms. Leonora Gores is enrolled in a chronic special needs plan for Diabetes. Client called with no answer No answer and HIPAA compliant message left. 1st attempt Plan for 2nd outreach call in one week Chronic care management coordinator will attempt outreach in one week.   Peter Garter RN, Jackquline Denmark, CDE Chronic Care Management Coordinator [facility] Management [phone]

## 2019-05-24 ENCOUNTER — Ambulatory Visit (INDEPENDENT_AMBULATORY_CARE_PROVIDER_SITE_OTHER): Payer: HMO | Admitting: Pulmonary Disease

## 2019-05-24 ENCOUNTER — Encounter: Payer: Self-pay | Admitting: Pulmonary Disease

## 2019-05-24 ENCOUNTER — Other Ambulatory Visit: Payer: Self-pay

## 2019-05-24 VITALS — BP 134/68 | HR 72 | Temp 97.2°F | Ht 64.5 in | Wt 180.4 lb

## 2019-05-24 DIAGNOSIS — R0602 Shortness of breath: Secondary | ICD-10-CM | POA: Diagnosis not present

## 2019-05-24 NOTE — Patient Instructions (Signed)
Continue with Breo daily  Albuterol as needed  Call with any significant concerns  I will see you back in 6 months

## 2019-05-24 NOTE — Progress Notes (Signed)
Subjective:     Patient ID: Kristine Parrish, female   DOB: 02-04-1946, 74 y.o.   MRN: [id number]  Patient is being seen for shortness of breath on exertion  Breathing has improved with use of Breo Feels are a lot better  History of breast cancer for which she had chemotherapy about 3 years ago Did receive radiation treatments  She has been having shortness of breath for about 6 months  History of Charcot foot Has been trying to stay off the foot May require surgery  She has no occupational predisposition to lung disease  No history of pneumonia No wheezing Does not smoke my goodness  Past Medical History:  Diagnosis Date  . Arthritis    "hands "  . Breast cancer of upper-outer quadrant of left female breast ([place]) 08/15/2015  . Colon polyps   . Dyspnea    with exertion- "from chemo"  . GERD (gastroesophageal reflux disease)   . Hiatal hernia   . History of bronchitis   . History of radiation therapy 05/19/16- 06/29/16   Left Breast 50 Gy in 25 fractions, SCLV/PAB 45 Gy in 25 fractions, Left Breast boost 10 Gy in 5 fractions.   . Hypertension   . Neuropathy    from chemopathy  . Peptic ulcer disease   . Peripheral neuropathy   . Personal history of chemotherapy   . Personal history of radiation therapy   . Type 2 diabetes mellitus (HCC)    Type II   Social History   Socioeconomic History  . Marital status: Divorced    Spouse name: Not on file  . Number of children: 2  . Years of education: 65  . Highest education level: Not on file  Occupational History  . Not on file  Tobacco Use  . Smoking status: Never Smoker  . Smokeless tobacco: Never Used  Substance and Sexual Activity  . Alcohol use: No  . Drug use: No  . Sexual activity: Not Currently  Other Topics Concern  . Not on file  Social History Narrative   Patient is divorced.   Patient has two children   Patient works in a clerical position.   Patient has a high school education.   Patient drinks  caffeine rarely.   Social Determinants of Health   Financial Resource Strain:   . Difficulty of Paying Living Expenses:   Food Insecurity: No Food Insecurity  . Worried About Charity fundraiser in the Last Year: Never true  . Ran Out of Food in the Last Year: Never true  Transportation Needs: No Transportation Needs  . Lack of Transportation (Medical): No  . Lack of Transportation (Non-Medical): No  Physical Activity:   . Days of Exercise per Week:   . Minutes of Exercise per Session:   Stress:   . Feeling of Stress :   Social Connections:   . Frequency of Communication with Friends and Family:   . Frequency of Social Gatherings with Friends and Family:   . Attends Religious Services:   . Active Member of Clubs or Organizations:   . Attends Archivist Meetings:   Marland Kitchen Marital Status:   Intimate Partner Violence:   . Fear of Current or Ex-Partner:   . Emotionally Abused:   Marland Kitchen Physically Abused:   . Sexually Abused:    Family History  Problem Relation Age of Onset  . Diabetes Mother   . Hypertension Mother   . Alcoholism Maternal Grandfather   . Congestive Heart  Failure Maternal Grandmother     Review of Systems  Constitutional: Negative for fever and unexpected weight change.  HENT: Negative for congestion, dental problem, ear pain, nosebleeds, postnasal drip, rhinorrhea, sinus pressure, sneezing, sore throat and trouble swallowing.   Eyes: Negative for redness and itching.  Respiratory: Negative for cough, chest tightness, shortness of breath and wheezing.   Cardiovascular: Negative for palpitations and leg swelling.  Gastrointestinal: Negative for nausea and vomiting.  Genitourinary: Negative for dysuria.  Musculoskeletal: Negative for joint swelling.  Skin: Negative for rash.  Allergic/Immunologic: Negative.  Negative for environmental allergies, food allergies and immunocompromised state.  Neurological: Negative for headaches.  Hematological: Does not  bruise/bleed easily.  Psychiatric/Behavioral: Negative for dysphoric mood. The patient is not nervous/anxious.       Objective:   Physical Exam Constitutional:      General: She is not in acute distress.    Appearance: Normal appearance. She is not ill-appearing.  HENT:     Head: Normocephalic and atraumatic.     Mouth/Throat:     Mouth: Mucous membranes are moist.     Pharynx: No oropharyngeal exudate.  Eyes:     General:        Right eye: No discharge.     Extraocular Movements: Extraocular movements intact.     Pupils: Pupils are equal, round, and reactive to light.  Cardiovascular:     Rate and Rhythm: Normal rate and regular rhythm.     Heart sounds: No murmur. No friction rub.  Pulmonary:     Effort: Pulmonary effort is normal. No respiratory distress.     Breath sounds: Normal breath sounds. No stridor. No wheezing, rhonchi or rales.  Musculoskeletal:     Cervical back: Normal range of motion and neck supple. No rigidity. No muscular tenderness.  Neurological:     Mental Status: She is alert.    Vitals:   05/24/19 1532  BP: 134/68  Pulse: 72  Temp: (!) 97.2 F (36.2 C)  SpO2: 100%      CT scan of the chest reviewed by myself showing a mosaic pattern, some air trapping  PFT shows significant bronchodilator response in the small airways  Echocardiogram reveals diastolic dysfunction  Assessment:     Shortness of breath -Multifactorial -Possible small airway disease -Diastolic dysfunction -Deconditioning -No occupational predisposition, no significant exposure to secondhand smoke  History of diastolic dysfunction -Echocardiogram was reviewed showing diastolic dysfunction  Deconditioning -Has been trying to be active -She does have a Charcot foot that prevents significant activity  Multifactorial reasons for shortness of breath -Significant improvement since starting Breo  History of breast cancer     Plan:     Physical activity as  tolerated  Continue albuterol as needed  Continue Breo   I will see her back in the office in about 6 months

## 2019-05-29 ENCOUNTER — Other Ambulatory Visit: Payer: Self-pay

## 2019-05-29 NOTE — Patient Outreach (Signed)
[facility]) Care Management Chronic Special Needs Program  05/29/2019  Name: LENICE KOPER DOB: [birth year]  MRN: [id number]  Ms. Jull Harral is enrolled in a chronic special needs plan for Diabetes. Chronic Care Management Coordinator telephoned client to complete 2021 health risk assessment and to develop individualized care plan.  Reviewed the chronic care management program, importance of client participation, and taking their care plan to all provider appointments and inpatient facilities.  Reviewed the transition of care process and possible referral to community care management.  Subjective: Client states that she has moved into her new place near her daughter and she is getting settled.  States that she is to see her doctor in April for a check up.  States she checks her blood sugars 2-3 times a week and they range from 78-120.  States she rarely has a low blood sugar.  States her breathing is much better now and she is able to do more activities.  States she has been walking to the mail box some and using hand weights.  States she is thinking about joining Chief of Staff now since she has had both of her COVID shots.  Denies any falls and she uses her cane as needed.  States she checks her feet daily and that her rt foot with Charcot is the same with no sores.  States her foot makes it difficult to walk very much for exercise.  States she is eating healthy and watching her portions.  States her B/P is good when she checks it.  States she still has her Advanced Directives forms and she plans to get completed sometime.  Goals Addressed            This Visit's Progress   . COMPLETED: Advanced Care Planning complete by next 9 months(continue 12/13/18   Not on track    Client has Advanced Directives forms and understands how to complete Sent EMMI: Advanced Directives   Goal not completed 05/29/19    . Client understands the importance of follow-up with providers by  attending scheduled visits   On track    Reinforced to keep scheduled appointments with providers    . COMPLETED: Client will use Assistive Devices as needed and verbalize understanding of device use   On track    Reports no issues with glucometer or B/P monitor Goal completed 05/29/19    . Client will verbalize knowledge of self management of Hypertension as evidences by BP reading of 140/90 or less; or as defined by provider   On track    Reports B/P less than 140/90 when checking at home and at providers office Plan to check B/P regularly Take B/P medications as ordered Plan to follow a low salt diet  Increase activity as tolerated Sent EMMI: High Blood Pressure (Hypertension) What you can do    . Diabetes Patient stated goal to enroll in Silver sneakers in the next 6 months( continue 05/29/19) (pt-stated)   Not on track    Reinforced to call to enroll in [place] at [phone] Reviewed importance of regular exercise to blood sugar control Encouraged to look into water aerobics at the [facility] and discussed other exercises which put less stress on her feet  Sent EMMI: Type 2 Diabetes: Food and Exercise    . HEMOGLOBIN A1C < 7.0       Last Hemoglobin A1C 7.3% on 12/15/18 Reviewed fasting blood sugar goals of 80-130 and less than 180 1 1/2-2 hours after meals Reinforced to  follow a low carbohydrate low salt diet and to watch portion sizes Reviewed use and possible side effects of diabetes medications  Reviewed signs and symptoms of hypoglycemia and actions to take Reviewed diabetes action plan in [place] calendar    . Maintain timely refills of diabetic medication as prescribed within the year .   On track    Maintaining timely refills of medications per dispense report     . Obtain annual  Lipid Profile, LDL-C   On track    Last completed 12/15/18 LDL 77 The goal for LDL is less than 70 mg/dL as you are at high risk for complications Try to avoid saturated fats,  trans-fats and eat more fiber Plan to take statin as ordered    . Obtain Annual Eye (retinal)  Exam    On track    Last completed 04/05/19 Plan to have a dilated eye exam every year    . Obtain Annual Foot Exam   On track    Last completed 12/15/18 Check your skin and feet every day for cuts, bruises, redness, blisters, or sores. Report to provider any problems with your feet Schedule a foot exam with your health care provider once every year    . Obtain annual screen for micro albuminuria (urine) , nephropathy (kidney problems)   On track    It is important for your doctor to check your urine for protein at least every year    . Obtain Hemoglobin A1C at least 2 times per year   On track    Last completed 12/16/18 It is important to have your Hemoglobin A1C checked every 6 months if you are at goal and every 3 months if you are not at goal    . Visit Primary Care Provider or Endocrinologist at least 2 times per year    On track    Primary care provider 12/15/18 Annual Wellness Visit 12/15/18 Please schedule your annual wellness visit     Client is not meeting diabetes self management goal of hemoglobin A1C of <7% with last reading of 7.3% Reviewed number for 24-hour nurse Line Reviewed COVID 19 precautions  Plan:  Send successful outreach letter with a copy of their individualized care plan, Send individual care plan to provider and Send educational material EMMI-High Blood Pressure(Hypertension): What you can do. Advanced Directives Type 2 diabetes: Food and Exercise  Chronic care management coordination will outreach in:  6 Months     [place], [facility], [place] Management Coordinator [place] [facility] [phone]

## 2019-06-09 ENCOUNTER — Other Ambulatory Visit: Payer: Self-pay | Admitting: Pulmonary Disease

## 2019-06-15 DIAGNOSIS — G629 Polyneuropathy, unspecified: Secondary | ICD-10-CM | POA: Diagnosis not present

## 2019-06-15 DIAGNOSIS — E1149 Type 2 diabetes mellitus with other diabetic neurological complication: Secondary | ICD-10-CM | POA: Diagnosis not present

## 2019-06-15 DIAGNOSIS — E7800 Pure hypercholesterolemia, unspecified: Secondary | ICD-10-CM | POA: Diagnosis not present

## 2019-06-15 DIAGNOSIS — Z7984 Long term (current) use of oral hypoglycemic drugs: Secondary | ICD-10-CM | POA: Diagnosis not present

## 2019-06-15 DIAGNOSIS — E11610 Type 2 diabetes mellitus with diabetic neuropathic arthropathy: Secondary | ICD-10-CM | POA: Diagnosis not present

## 2019-06-15 DIAGNOSIS — E669 Obesity, unspecified: Secondary | ICD-10-CM | POA: Diagnosis not present

## 2019-06-15 DIAGNOSIS — C50919 Malignant neoplasm of unspecified site of unspecified female breast: Secondary | ICD-10-CM | POA: Diagnosis not present

## 2019-06-15 DIAGNOSIS — D509 Iron deficiency anemia, unspecified: Secondary | ICD-10-CM | POA: Diagnosis not present

## 2019-06-15 DIAGNOSIS — K279 Peptic ulcer, site unspecified, unspecified as acute or chronic, without hemorrhage or perforation: Secondary | ICD-10-CM | POA: Diagnosis not present

## 2019-06-15 DIAGNOSIS — I10 Essential (primary) hypertension: Secondary | ICD-10-CM | POA: Diagnosis not present

## 2019-06-19 DIAGNOSIS — M14671 Charcot's joint, right ankle and foot: Secondary | ICD-10-CM | POA: Diagnosis not present

## 2019-06-19 DIAGNOSIS — M79672 Pain in left foot: Secondary | ICD-10-CM | POA: Insufficient documentation

## 2019-06-19 DIAGNOSIS — M6701 Short Achilles tendon (acquired), right ankle: Secondary | ICD-10-CM | POA: Diagnosis not present

## 2019-06-19 DIAGNOSIS — M25572 Pain in left ankle and joints of left foot: Secondary | ICD-10-CM | POA: Diagnosis not present

## 2019-06-19 DIAGNOSIS — M79671 Pain in right foot: Secondary | ICD-10-CM | POA: Diagnosis not present

## 2019-07-07 ENCOUNTER — Other Ambulatory Visit: Payer: Self-pay | Admitting: Oncology

## 2019-07-07 ENCOUNTER — Telehealth: Payer: Self-pay | Admitting: Adult Health

## 2019-07-07 DIAGNOSIS — D649 Anemia, unspecified: Secondary | ICD-10-CM | POA: Insufficient documentation

## 2019-07-07 DIAGNOSIS — Z170 Estrogen receptor positive status [ER+]: Secondary | ICD-10-CM

## 2019-07-07 DIAGNOSIS — C50412 Malignant neoplasm of upper-outer quadrant of left female breast: Secondary | ICD-10-CM

## 2019-07-07 NOTE — Telephone Encounter (Signed)
Scheduled appt per 4/30 sch message - unable to reach pt . Left message with appt date and time

## 2019-07-11 ENCOUNTER — Other Ambulatory Visit: Payer: Self-pay | Admitting: Oncology

## 2019-07-11 ENCOUNTER — Other Ambulatory Visit: Payer: Self-pay

## 2019-07-11 ENCOUNTER — Inpatient Hospital Stay: Payer: HMO | Attending: Oncology

## 2019-07-11 DIAGNOSIS — D513 Other dietary vitamin B12 deficiency anemia: Secondary | ICD-10-CM | POA: Diagnosis not present

## 2019-07-11 DIAGNOSIS — Z79811 Long term (current) use of aromatase inhibitors: Secondary | ICD-10-CM | POA: Diagnosis not present

## 2019-07-11 DIAGNOSIS — Z923 Personal history of irradiation: Secondary | ICD-10-CM | POA: Diagnosis not present

## 2019-07-11 DIAGNOSIS — Z170 Estrogen receptor positive status [ER+]: Secondary | ICD-10-CM | POA: Diagnosis not present

## 2019-07-11 DIAGNOSIS — Z9079 Acquired absence of other genital organ(s): Secondary | ICD-10-CM | POA: Diagnosis not present

## 2019-07-11 DIAGNOSIS — C773 Secondary and unspecified malignant neoplasm of axilla and upper limb lymph nodes: Secondary | ICD-10-CM | POA: Insufficient documentation

## 2019-07-11 DIAGNOSIS — D649 Anemia, unspecified: Secondary | ICD-10-CM

## 2019-07-11 DIAGNOSIS — Z90722 Acquired absence of ovaries, bilateral: Secondary | ICD-10-CM | POA: Insufficient documentation

## 2019-07-11 DIAGNOSIS — D509 Iron deficiency anemia, unspecified: Secondary | ICD-10-CM | POA: Diagnosis not present

## 2019-07-11 DIAGNOSIS — Z90710 Acquired absence of both cervix and uterus: Secondary | ICD-10-CM | POA: Diagnosis not present

## 2019-07-11 DIAGNOSIS — C50412 Malignant neoplasm of upper-outer quadrant of left female breast: Secondary | ICD-10-CM | POA: Diagnosis not present

## 2019-07-11 DIAGNOSIS — R5383 Other fatigue: Secondary | ICD-10-CM | POA: Diagnosis not present

## 2019-07-11 DIAGNOSIS — Z9221 Personal history of antineoplastic chemotherapy: Secondary | ICD-10-CM | POA: Diagnosis not present

## 2019-07-11 LAB — SAVE SMEAR(SSMR), FOR PROVIDER SLIDE REVIEW

## 2019-07-11 LAB — CBC WITH DIFFERENTIAL/PLATELET
Abs Immature Granulocytes: 0.03 10*3/uL (ref 0.00–0.07)
Basophils Absolute: 0 10*3/uL (ref 0.0–0.1)
Basophils Relative: 0 %
Eosinophils Absolute: 0.1 10*3/uL (ref 0.0–0.5)
Eosinophils Relative: 2 %
HCT: 35.4 % — ABNORMAL LOW (ref 36.0–46.0)
Hemoglobin: 11.3 g/dL — ABNORMAL LOW (ref 12.0–15.0)
Immature Granulocytes: 0 %
Lymphocytes Relative: 15 %
Lymphs Abs: 1.1 10*3/uL (ref 0.7–4.0)
MCH: 28.9 pg (ref 26.0–34.0)
MCHC: 31.9 g/dL (ref 30.0–36.0)
MCV: 90.5 fL (ref 80.0–100.0)
Monocytes Absolute: 0.5 10*3/uL (ref 0.1–1.0)
Monocytes Relative: 7 %
Neutro Abs: 5.2 10*3/uL (ref 1.7–7.7)
Neutrophils Relative %: 76 %
Platelets: 184 10*3/uL (ref 150–400)
RBC: 3.91 MIL/uL (ref 3.87–5.11)
RDW: 15.3 % (ref 11.5–15.5)
WBC: 7 10*3/uL (ref 4.0–10.5)
nRBC: 0 % (ref 0.0–0.2)

## 2019-07-11 LAB — RETICULOCYTES
Immature Retic Fract: 14.8 % (ref 2.3–15.9)
RBC.: 3.91 MIL/uL (ref 3.87–5.11)
Retic Count, Absolute: 55.9 10*3/uL (ref 19.0–186.0)
Retic Ct Pct: 1.4 % (ref 0.4–3.1)

## 2019-07-11 LAB — COMPREHENSIVE METABOLIC PANEL
ALT: 11 U/L (ref 0–44)
AST: 12 U/L — ABNORMAL LOW (ref 15–41)
Albumin: 3.5 g/dL (ref 3.5–5.0)
Alkaline Phosphatase: 133 U/L — ABNORMAL HIGH (ref 38–126)
Anion gap: 8 (ref 5–15)
BUN: 9 mg/dL (ref 8–23)
CO2: 27 mmol/L (ref 22–32)
Calcium: 9.2 mg/dL (ref 8.9–10.3)
Chloride: 106 mmol/L (ref 98–111)
Creatinine, Ser: 0.83 mg/dL (ref 0.44–1.00)
GFR calc Af Amer: >60 mL/min (ref >60)
GFR calc non Af Amer: >60 mL/min (ref >60)
Glucose, Bld: 127 mg/dL — ABNORMAL HIGH (ref 70–99)
Potassium: 3.6 mmol/L (ref 3.5–5.1)
Sodium: 141 mmol/L (ref 135–145)
Total Bilirubin: 0.5 mg/dL (ref 0.3–1.2)
Total Protein: 6.8 g/dL (ref 6.5–8.1)

## 2019-07-11 LAB — IRON AND TIBC
Iron: 43 ug/dL (ref 41–142)
Saturation Ratios: 14 % — ABNORMAL LOW (ref 21–57)
TIBC: 308 ug/dL (ref 236–444)
UIBC: 265 ug/dL (ref 120–384)

## 2019-07-11 LAB — FERRITIN: Ferritin: 50 ng/mL (ref 11–307)

## 2019-07-11 LAB — SEDIMENTATION RATE: Sed Rate: 21 mm/hr (ref 0–22)

## 2019-07-11 LAB — FOLATE: Folate: 13.3 ng/mL (ref >5.9)

## 2019-07-11 LAB — VITAMIN B12: Vitamin B-12: 58 pg/mL — ABNORMAL LOW (ref 180–914)

## 2019-07-11 LAB — LACTATE DEHYDROGENASE: LDH: 218 U/L — ABNORMAL HIGH (ref 98–192)

## 2019-07-13 ENCOUNTER — Telehealth: Payer: Self-pay | Admitting: Adult Health

## 2019-07-13 ENCOUNTER — Inpatient Hospital Stay: Payer: HMO | Admitting: Adult Health

## 2019-07-13 ENCOUNTER — Other Ambulatory Visit: Payer: Self-pay | Admitting: Oncology

## 2019-07-13 ENCOUNTER — Other Ambulatory Visit: Payer: Self-pay

## 2019-07-13 VITALS — BP 143/73 | HR 81 | Temp 98.3°F | Resp 18 | Ht 64.5 in | Wt 180.8 lb

## 2019-07-13 DIAGNOSIS — C50412 Malignant neoplasm of upper-outer quadrant of left female breast: Secondary | ICD-10-CM | POA: Diagnosis not present

## 2019-07-13 DIAGNOSIS — Z170 Estrogen receptor positive status [ER+]: Secondary | ICD-10-CM | POA: Diagnosis not present

## 2019-07-13 NOTE — Progress Notes (Addendum)
Kristine Parrish  Telephone:([phone]) [phone] Fax:(336) [phone]     ID: Kristine Parrish DOB: 1945-10-24  MR#: [id number]  OVF#:[id number]  Patient Care Team: Orpah Melter, MD as PCP - General (Family Medicine) Jovita Kussmaul, MD as Consulting Physician (General Surgery) Magrinat, Virgie Dad, MD as Consulting Physician (Oncology) Eppie Gibson, MD as Attending Physician (Radiation Oncology) Murvin Donning, MD (Dermatology) Laurence Spates, MD (Inactive) as Consulting Physician (Gastroenterology) Rosemary Holms, DPM as Consulting Physician (Podiatry) Maisie Fus, MD as Consulting Physician (Obstetrics and Gynecology) Larey Dresser, MD as Consulting Physician (Cardiology) Wylene Simmer, MD as Consulting Physician (Orthopedic Surgery) Dimitri Ped, RN as [facility] Laurin Coder, MD as Consulting Physician (Pulmonary Disease) OTHER MD:  CHIEF COMPLAINT: Estrogen receptor positive breast cancer  CURRENT TREATMENT: anastrozole   INTERVAL HISTORY: Kristine Parrish returns today for follow-up of her estrogen receptor positive breast cancer and new h/o anemia.    She continues on anastrozole.  She has no hot flashes or other Parrish effects from this medication that she is aware of.  She underwent anemia work up recently which showed a decreased B12 level, and iron deficiency.  She is here to meet with [place] and discuss this further.  She does not a progressive fatigue, but otherwise has no issues.    REVIEW OF SYSTEMS: Kristine Parrish denies any fever, chills, chest pain, palpitations, cough, shortness of breath, bowel/bladder changes, nausea, vomiting, night sweats, unintentional weight loss, lymphadenopathy.  She has difficulty exercising due to her charcot foot.  A detailed ROS was otherwise non contributory.    BREAST CANCER HISTORY: From the original intake note  Kristine Parrish herself noted a change in her left breast and brought it to the attention of Dr.  Nori Riis, who set her up for bilateral diagnostic mammography with tomography and left breast ultrasonography at the [facility] 08/09/2015. The breast density was category D. In the area of palpable concern there was a mass with irregular margins and architectural distortion, measuring approximately 3 cm. This was palpable in the upper outer quadrant near the nipple. The ultrasound confirmed an irregular hypoechoic mass approximately 3 cm from the nipple at the 10:00 position measuring 2.7 cm. Ultrasound of the left axilla found a single level I left axillary node with focal cortical thickening.  On 08/13/2015 the patient underwent biopsy of the left breast mass and the suspicious left axillary lymph node, both showing invasive ductal carcinoma, grade 2 or 3, estrogen receptor 100% positive, progesterone receptor 100% positive, both with strong staining intensity, with an MIB-1 of 50%, and HER-2 amplification, the signals ratio being 2.2 to and the number per cell 3.55  The patient's subsequent history is as detailed below   PAST MEDICAL HISTORY: Past Medical History:  Diagnosis Date  . Arthritis    "hands "  . Breast cancer of upper-outer quadrant of left female breast ([place]) 08/15/2015  . Colon polyps   . Dyspnea    with exertion- "from chemo"  . GERD (gastroesophageal reflux disease)   . Hiatal hernia   . History of bronchitis   . History of radiation therapy 05/19/16- 06/29/16   Left Breast 50 Gy in 25 fractions, SCLV/PAB 45 Gy in 25 fractions, Left Breast boost 10 Gy in 5 fractions.   . Hypertension   . Neuropathy    from chemopathy  . Peptic ulcer disease   . Peripheral neuropathy   . Personal history of chemotherapy   . Personal history of radiation therapy   .  Type 2 diabetes mellitus ([place])    Type II    PAST SURGICAL HISTORY: Past Surgical History:  Procedure Laterality Date  . APPENDECTOMY  1974  . BREAST LUMPECTOMY Left 02/14/2016  . cataract surgery Bilateral 04/2012  .  CHOLECYSTECTOMY  1996  . COLONOSCOPY    . FOOT SURGERY     left bunion removed  . nerve removed from left foot  2000  . PORTACATH PLACEMENT N/A 09/05/2015   Procedure: INSERTION PORT-A-CATH;  Surgeon: Autumn Messing III, MD;  Location: [facility];  Service: General;  Laterality: N/A;  . RADIOACTIVE SEED GUIDED PARTIAL MASTECTOMY/AXILLARY SENTINEL NODE BIOPSY/AXILLARY NODE DISSECTION Left 02/14/2016   Procedure: LEFT BREAST RADIOACTIVE SEED GUIDED LUMPECTOMY WITH LEFT  SENTINEL LYMPH NODE BIOPSY AND LEFT  SEED TARGETED DISSECTION;  Surgeon: Autumn Messing III, MD;  Location: [facility];  Service: General;  Laterality: Left;  . TUBAL LIGATION    . VAGINAL HYSTERECTOMY  1995    FAMILY HISTORY Family History  Problem Relation Age of Onset  . Diabetes Mother   . Hypertension Mother   . Alcoholism Maternal Grandfather   . Congestive Heart Failure Maternal Grandmother   The patient has little information about her father and is not sure of his cause of death or age at death. The patient's mother died at age 41. The patient had no siblings. She was raised by her grandmother. She is not aware of any breast or ovarian cancer history in the family    GYNECOLOGIC HISTORY:  No LMP recorded. Patient has had a hysterectomy.  menarche age 48, first live birth age 65. The patient is GX P2. She underwent hysterectomy with bilateral salpingo-oophorectomy in 1994, and has been on estrogen replacement since that time, discontinued June 2017.    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  She is divorced and currently lives next door to her daughter, [name], who lives in [place] and works as a Teacher, music for the [employer]. Son, [name] lives in [place] where he is Secondary school teacher. The patient has one granddaughter, 60 years old as of August 2019.  The patient attends [employer]. The patient's daughter [name] is her healthcare power of attorney.    HEALTH MAINTENANCE: Social History   Tobacco Use  . Smoking status: Never Smoker  . Smokeless tobacco: Never Used  Substance Use Topics  . Alcohol use: No  . Drug use: No     Colonoscopy: 2012/[facility]  PAP:  Bone density:  06/24/2016 shows a T score of -0.7 (normal)  Lipid panel:  Allergies  Allergen Reactions  . No Known Allergies     Current Outpatient Medications  Medication Sig Dispense Refill  . amoxicillin (AMOXIL) 875 MG tablet Take 875 mg by mouth 2 (two) times daily.    Marland Kitchen anastrozole (ARIMIDEX) 1 MG tablet TAKE 1 TABLET BY MOUTH EVERY DAY 90 tablet 0  . aspirin 81 MG tablet Take 81 mg by mouth daily.    Marland Kitchen BREO ELLIPTA 100-25 MCG/INH AEPB INHALE 1 PUFF BY MOUTH EVERY DAY 60 each 12  . ferrous sulfate 325 (65 FE) MG tablet Take 325 mg by mouth 3 (three) times daily with meals.    . gabapentin (NEURONTIN) 800 MG tablet Take 800 mg by mouth 3 (three) times daily.    Marland Kitchen glipiZIDE (GLUCOTROL) 10 MG tablet Take 10  mg by mouth daily before breakfast.    . ibuprofen (ADVIL) 800 MG tablet ibuprofen 800 mg tablet    . lovastatin (MEVACOR) 20 MG tablet Take 20 mg by mouth at bedtime.    . metFORMIN (GLUCOPHAGE) 500 MG tablet Take 500 mg by mouth 2 (two) times daily with a meal.     . Multiple Vitamin (MULTIVITAMIN) tablet Take 1 tablet by mouth daily.    . Omega-3 Fatty Acids (FISH OIL PO) Take 2 tablets by mouth. 2487m a day    . omeprazole (PRILOSEC) 20 MG capsule Take 20 mg by mouth daily.    . pioglitazone (ACTOS) 15 MG tablet Take 15 mg by mouth daily.    . Probiotic Product (PROBIOTIC DAILY PO) Take 1 tablet by mouth daily. Reported on 08/21/2015    . spironolactone (ALDACTONE) 25 MG tablet spironolactone 25 mg tablet    . albuterol (VENTOLIN HFA) 108 (90 Base) MCG/ACT inhaler Inhale 2 puffs into the lungs every 6 (six) hours as needed for wheezing or shortness of breath. (Patient not taking:  Reported on 07/13/2019) 18 g 6  . losartan (COZAAR) 25 MG tablet Take 1 tablet (25 mg total) by mouth daily. 30 tablet 6   No current facility-administered medications for this visit.    OBJECTIVE: Middle-aged white woman in no acute distress Vitals:   07/13/19 1500  BP: (!) 143/73  Pulse: 81  Resp: 18  Temp: 98.3 F (36.8 C)  SpO2: 99%   Wt Readings from Last 3 Encounters:  07/13/19 180 lb 12.8 oz (82 kg)  05/24/19 180 lb 6.4 oz (81.8 kg)  05/08/19 181 lb 3.2 oz (82.2 kg)   Body mass index is 30.55 kg/m.    ECOG FS:2 - Symptomatic, <50% confined to bed GENERAL: Patient is a well appearing female in no acute distress HEENT:  Sclerae anicteric.  Mask in place. Neck is supple.  NODES:  No cervical, supraclavicular, or axillary lymphadenopathy palpated.  BREAST EXAM:  Left breast s/p lumpectomy, no sign of local recurrence, right breast benign LUNGS:  Clear to auscultation bilaterally.  No wheezes or rhonchi. HEART:  Regular rate and rhythm. No murmur appreciated. ABDOMEN:  Soft, nontender.  Positive, normoactive bowel sounds. No organomegaly palpated. MSK:  No focal spinal tenderness to palpation. Full range of motion bilaterally in the upper extremities. EXTREMITIES:  No peripheral edema.   SKIN:  Clear with no obvious rashes or skin changes. No nail dyscrasia. NEURO:  Nonfocal. Well oriented.  Appropriate affect.   LAB RESULTS:  CMP     Component Value Date/Time   NA 141 07/11/2019 1119   NA 140 12/28/2016 1043   K 3.6 07/11/2019 1119   K 4.1 12/28/2016 1043   CL 106 07/11/2019 1119   CO2 27 07/11/2019 1119   CO2 26 12/28/2016 1043   GLUCOSE 127 (H) 07/11/2019 1119   GLUCOSE 173 (H) 12/28/2016 1043   BUN 9 07/11/2019 1119   BUN 16.9 12/28/2016 1043   CREATININE 0.83 07/11/2019 1119   CREATININE 0.9 12/28/2016 1043   CALCIUM 9.2 07/11/2019 1119   CALCIUM 9.3 12/28/2016 1043   PROT 6.8 07/11/2019 1119   PROT 7.0 12/28/2016 1043   ALBUMIN 3.5 07/11/2019 1119    ALBUMIN 3.6 12/28/2016 1043   AST 12 (L) 07/11/2019 1119   AST 17 12/28/2016 1043   ALT 11 07/11/2019 1119   ALT 17 12/28/2016 1043   ALKPHOS 133 (H) 07/11/2019 1119   ALKPHOS 158 (H) 12/28/2016 1043  BILITOT 0.5 07/11/2019 1119   BILITOT 0.36 12/28/2016 1043   GFRNONAA >60 07/11/2019 1119   GFRAA >60 07/11/2019 1119    INo results found for: SPEP, UPEP  Lab Results  Component Value Date   WBC 7.0 07/11/2019   NEUTROABS 5.2 07/11/2019   HGB 11.3 (L) 07/11/2019   HCT 35.4 (L) 07/11/2019   MCV 90.5 07/11/2019   PLT 184 07/11/2019      Chemistry      Component Value Date/Time   NA 141 07/11/2019 1119   NA 140 12/28/2016 1043   K 3.6 07/11/2019 1119   K 4.1 12/28/2016 1043   CL 106 07/11/2019 1119   CO2 27 07/11/2019 1119   CO2 26 12/28/2016 1043   BUN 9 07/11/2019 1119   BUN 16.9 12/28/2016 1043   CREATININE 0.83 07/11/2019 1119   CREATININE 0.9 12/28/2016 1043      Component Value Date/Time   CALCIUM 9.2 07/11/2019 1119   CALCIUM 9.3 12/28/2016 1043   ALKPHOS 133 (H) 07/11/2019 1119   ALKPHOS 158 (H) 12/28/2016 1043   AST 12 (L) 07/11/2019 1119   AST 17 12/28/2016 1043   ALT 11 07/11/2019 1119   ALT 17 12/28/2016 1043   BILITOT 0.5 07/11/2019 1119   BILITOT 0.36 12/28/2016 1043     No results found for: LABCA2  No components found for: LABCA125  No results for input(s): INR in the last 168 hours.  Urinalysis No results found for: COLORURINE, APPEARANCEUR, LABSPEC, PHURINE, GLUCOSEU, HGBUR, BILIRUBINUR, KETONESUR, PROTEINUR, UROBILINOGEN, NITRITE, LEUKOCYTESUR   STUDIES: No results found.   ELIGIBLE FOR AVAILABLE RESEARCH PROTOCOL: Not eligible for PALLAS because HER-2 positive  ASSESSMENT: 74 y.o. Kristine Parrish woman status post left breast upper outer quadrant and left axillary lymph node biopsy 08/13/2015 both positive for an invasive ductal carcinoma, grade 2 or 3, triple positive, with an MIB-1 of 50%  (1) neoadjuvant chemotherapy consisting of  carboplatin, docetaxel, trastuzumab and pertuzumab given every 21 days Started 09/11/2015  (a) treatment changed to Abraxane and trastuzumab with cycle 2 because of Parrish effects after cycle 1  (b) Abraxane discontinued after 3d dose (10/17/2015) because of peripheral neuropathy  (c) cyclophosphamide, methotrexate and fluorouracil (CMF) started 11/07/2015, 4 cycles, last dose 01/09/2016   (2) trastuzumab continued to complete a year (last dose September 24, 2016)  (a) final echocardiogram 07/23/2016 showed a well-preserved ejection fraction at 55-60%  (3) status post left lumpectomy with targeted axillary dissection 02/14/2016 showing a residual pT1c pN1 invasive ductal carcinoma, grade 2, estrogen and progesterone receptor positive, now HER-2 not amplified; margins were negative  (a) left axillary seroma/abscess drained surgically 03/25/2016, but persists  (4) adjuvant radiation 05/19/16 - 06/29/16 Site/dose:    Left breast - 4 field             Left breast: 50 Gy in 25 fractions             SCLV/PAB: 45 Gy in 25 fractions Left breast boost: 10 GY in 5 fractions  (5) anastrozole started 07/07/2016  (a) bone density 06/24/2016 shows a T score of -0.7 (normal)  (b) density August 2021  (6) Anemia related to decreased iron saturation and b12 deficiency  (a) Feraheme to be given x 2 starting on 08/04/2019  (b) b12 injections weekly x4 (interrupted slightly due to travel plans), then monthly  PLAN: Arista is doing well as far as her breast cancer is concerned.  She continues on anastrozole with good tolerance and has no signs of  breast  Cancer recurrence.  She will cotninue this.  We reviewed with [place] that her anemia is related to b12 deficiency and iron deficiency.  She met with myself and Dr. Jana Hakim.  She was given information on b12 injections and feraheme in detail.  She will receive feraheme x 2 starting on 08/04/2019 (she is going out of the state for few weeks starting Sunday).  She will  also receive weekly b12 injections to start tomorrow, and then resume when she returns from [place].    We will see her back in about 6 weeks for labs and f/u.  She was recommended to continue with the appropriate pandemic precautions. She knows to call for any questions that may arise between now and her next appointment.  We are happy to see her sooner if needed.   Total encounter time 30 minutes.Wilber Bihari, NP 07/13/19 3:08 PM Medical Oncology and Hematology [facility] [place], [place] [address] Tel. [phone]    Fax. [phone]   ADDENDUM: Kristine Parrish developed a microcytic anemia (drop in MCV to 82.2 from baseline greater than 90, hemoglobin down to 9.6) about a year ago.  She has been intermittently on iron supplementation since that time.  She remained borderline anemic when last checked 2 months ago with a hemoglobin of 11.7, her MCV having normalized at 91.2.  On 07/11/2019 the hemoglobin was still not entirely normalized at 11.3.  The MCV was in the normal range at 90.5, with unremarkable white and platelet counts, but screening studies showed a remarkably low B12 at 58.  It had been 404 6 years prior.  She had a normal ferritin but low saturation indicating a component of anemia of chronic illness/inflammation.  Her renal function was normal and review of peripheral blood film was nondiagnostic.  We are going to obtain antiintrinsic factor and antiparietal cell cell antibodies with the next lab draw.  We will also recheck her thyroid studies since autoimmune hypothyroidism is frequent in these cases.  She will receive 2 doses of Feraheme and we discussed the possible toxicities Parrish effects and complications of this agent.  We are going to start B12 supplementation weekly as soon as she gets back from her a [place] trip.  She knows to call for any other issues that may develop before then.  I personally saw this patient and performed a substantive  portion of this encounter with the listed APP documented above.   Chauncey Cruel, MD Medical Oncology and Hematology [facility] [address] [place], [place] [address] Tel. [phone]    Fax. [phone]     *Total Encounter Time as defined by the Centers for Medicare and Medicaid Services includes, in addition to the face-to-face time of a patient visit (documented in the note above) non-face-to-face time: obtaining and reviewing outside history, ordering and reviewing medications, tests or procedures, care coordination (communications with other health care professionals or caregivers) and documentation in the medical record.

## 2019-07-13 NOTE — Telephone Encounter (Signed)
Scheduled appt per 5/6 sch message - unable to reach pt .left message for appt date and time

## 2019-07-13 NOTE — Patient Instructions (Signed)

## 2019-07-14 ENCOUNTER — Inpatient Hospital Stay: Payer: HMO

## 2019-07-14 ENCOUNTER — Other Ambulatory Visit: Payer: HMO

## 2019-07-14 ENCOUNTER — Other Ambulatory Visit: Payer: Self-pay | Admitting: Adult Health

## 2019-07-14 ENCOUNTER — Telehealth: Payer: Self-pay | Admitting: Oncology

## 2019-07-14 ENCOUNTER — Ambulatory Visit: Payer: HMO

## 2019-07-14 ENCOUNTER — Other Ambulatory Visit: Payer: Self-pay

## 2019-07-14 VITALS — BP 126/67 | HR 72 | Temp 98.0°F | Resp 18

## 2019-07-14 DIAGNOSIS — C50412 Malignant neoplasm of upper-outer quadrant of left female breast: Secondary | ICD-10-CM

## 2019-07-14 DIAGNOSIS — Z170 Estrogen receptor positive status [ER+]: Secondary | ICD-10-CM

## 2019-07-14 MED ORDER — CYANOCOBALAMIN 1000 MCG/ML IJ SOLN
INTRAMUSCULAR | Status: AC
  Filled 2019-07-14: qty 1

## 2019-07-14 MED ORDER — CYANOCOBALAMIN 1000 MCG/ML IJ SOLN
1000.0000 ug | Freq: Once | INTRAMUSCULAR | Status: AC
  Administered 2019-07-14: 1000 ug via INTRAMUSCULAR

## 2019-07-14 NOTE — Telephone Encounter (Signed)
Called pt per 5/7 sch message to schedule iron infusion. Per patient she will be out of town and need to set something up end of month.

## 2019-07-14 NOTE — Patient Instructions (Signed)

## 2019-07-17 ENCOUNTER — Telehealth: Payer: Self-pay | Admitting: Oncology

## 2019-07-17 NOTE — Telephone Encounter (Signed)
Scheduled appts per 5/6 los. Left voicemail with appt date and time.

## 2019-07-18 ENCOUNTER — Ambulatory Visit: Payer: HMO | Admitting: Adult Health

## 2019-07-18 ENCOUNTER — Encounter: Payer: Self-pay | Admitting: Adult Health

## 2019-08-04 ENCOUNTER — Other Ambulatory Visit: Payer: Self-pay

## 2019-08-04 ENCOUNTER — Inpatient Hospital Stay: Payer: HMO

## 2019-08-04 VITALS — BP 131/72 | HR 71 | Temp 97.7°F | Resp 18

## 2019-08-04 DIAGNOSIS — Z170 Estrogen receptor positive status [ER+]: Secondary | ICD-10-CM

## 2019-08-04 DIAGNOSIS — C50412 Malignant neoplasm of upper-outer quadrant of left female breast: Secondary | ICD-10-CM

## 2019-08-04 LAB — COMPREHENSIVE METABOLIC PANEL
ALT: 15 U/L (ref 0–44)
AST: 13 U/L — ABNORMAL LOW (ref 15–41)
Albumin: 3.6 g/dL (ref 3.5–5.0)
Alkaline Phosphatase: 134 U/L — ABNORMAL HIGH (ref 38–126)
Anion gap: 12 (ref 5–15)
BUN: 13 mg/dL (ref 8–23)
CO2: 21 mmol/L — ABNORMAL LOW (ref 22–32)
Calcium: 9.3 mg/dL (ref 8.9–10.3)
Chloride: 107 mmol/L (ref 98–111)
Creatinine, Ser: 0.8 mg/dL (ref 0.44–1.00)
GFR calc Af Amer: >60 mL/min (ref >60)
GFR calc non Af Amer: >60 mL/min (ref >60)
Glucose, Bld: 139 mg/dL — ABNORMAL HIGH (ref 70–99)
Potassium: 3.8 mmol/L (ref 3.5–5.1)
Sodium: 140 mmol/L (ref 135–145)
Total Bilirubin: 0.4 mg/dL (ref 0.3–1.2)
Total Protein: 7 g/dL (ref 6.5–8.1)

## 2019-08-04 LAB — CBC WITH DIFFERENTIAL/PLATELET
Abs Immature Granulocytes: 0.02 10*3/uL (ref 0.00–0.07)
Basophils Absolute: 0.1 10*3/uL (ref 0.0–0.1)
Basophils Relative: 1 %
Eosinophils Absolute: 0.3 10*3/uL (ref 0.0–0.5)
Eosinophils Relative: 5 %
HCT: 36.6 % (ref 36.0–46.0)
Hemoglobin: 11.5 g/dL — ABNORMAL LOW (ref 12.0–15.0)
Immature Granulocytes: 0 %
Lymphocytes Relative: 16 %
Lymphs Abs: 1 10*3/uL (ref 0.7–4.0)
MCH: 29 pg (ref 26.0–34.0)
MCHC: 31.4 g/dL (ref 30.0–36.0)
MCV: 92.2 fL (ref 80.0–100.0)
Monocytes Absolute: 0.5 10*3/uL (ref 0.1–1.0)
Monocytes Relative: 8 %
Neutro Abs: 4.3 10*3/uL (ref 1.7–7.7)
Neutrophils Relative %: 70 %
Platelets: 213 10*3/uL (ref 150–400)
RBC: 3.97 MIL/uL (ref 3.87–5.11)
RDW: 14.6 % (ref 11.5–15.5)
WBC: 6.2 10*3/uL (ref 4.0–10.5)
nRBC: 0 % (ref 0.0–0.2)

## 2019-08-04 MED ORDER — SODIUM CHLORIDE 0.9 % IV SOLN
510.0000 mg | Freq: Once | INTRAVENOUS | Status: AC
  Administered 2019-08-04: 510 mg via INTRAVENOUS
  Filled 2019-08-04: qty 510

## 2019-08-04 MED ORDER — CYANOCOBALAMIN 1000 MCG/ML IJ SOLN
INTRAMUSCULAR | Status: AC
  Filled 2019-08-04: qty 1

## 2019-08-04 MED ORDER — CYANOCOBALAMIN 1000 MCG/ML IJ SOLN
1000.0000 ug | Freq: Once | INTRAMUSCULAR | Status: AC
  Administered 2019-08-04: 1000 ug via INTRAMUSCULAR

## 2019-08-04 NOTE — Patient Instructions (Signed)

## 2019-08-05 LAB — THYROID PANEL WITH TSH
Free Thyroxine Index: 1.6 (ref 1.2–4.9)
T3 Uptake Ratio: 24 % (ref 24–39)
T4, Total: 6.6 ug/dL (ref 4.5–12.0)
TSH: 3.26 u[IU]/mL (ref 0.450–4.500)

## 2019-08-05 LAB — ANTI-PARIETAL ANTIBODY: Parietal Cell Antibody-IgG: 10.4 Units (ref 0.0–20.0)

## 2019-08-08 LAB — INTRINSIC FACTOR ANTIBODIES: Intrinsic Factor: 0.9 AU/mL (ref 0.0–1.1)

## 2019-08-11 ENCOUNTER — Inpatient Hospital Stay: Payer: HMO | Attending: Oncology

## 2019-08-11 ENCOUNTER — Other Ambulatory Visit: Payer: Self-pay

## 2019-08-11 ENCOUNTER — Ambulatory Visit: Payer: HMO

## 2019-08-11 VITALS — BP 142/72 | HR 80 | Temp 98.0°F | Resp 17

## 2019-08-11 DIAGNOSIS — D509 Iron deficiency anemia, unspecified: Secondary | ICD-10-CM | POA: Diagnosis not present

## 2019-08-11 DIAGNOSIS — Z170 Estrogen receptor positive status [ER+]: Secondary | ICD-10-CM

## 2019-08-11 DIAGNOSIS — D519 Vitamin B12 deficiency anemia, unspecified: Secondary | ICD-10-CM | POA: Diagnosis not present

## 2019-08-11 DIAGNOSIS — D649 Anemia, unspecified: Secondary | ICD-10-CM

## 2019-08-11 DIAGNOSIS — C50412 Malignant neoplasm of upper-outer quadrant of left female breast: Secondary | ICD-10-CM

## 2019-08-11 MED ORDER — CYANOCOBALAMIN 1000 MCG/ML IJ SOLN
INTRAMUSCULAR | Status: AC
  Filled 2019-08-11: qty 1

## 2019-08-11 MED ORDER — SODIUM CHLORIDE 0.9 % IV SOLN
510.0000 mg | Freq: Once | INTRAVENOUS | Status: AC
  Administered 2019-08-11: 510 mg via INTRAVENOUS
  Filled 2019-08-11: qty 510

## 2019-08-11 MED ORDER — CYANOCOBALAMIN 1000 MCG/ML IJ SOLN
1000.0000 ug | Freq: Once | INTRAMUSCULAR | Status: AC
  Administered 2019-08-11: 1000 ug via INTRAMUSCULAR

## 2019-08-11 MED ORDER — SODIUM CHLORIDE 0.9 % IV SOLN
INTRAVENOUS | Status: DC
  Filled 2019-08-11: qty 250

## 2019-08-11 NOTE — Patient Instructions (Signed)

## 2019-08-18 ENCOUNTER — Inpatient Hospital Stay: Payer: HMO

## 2019-08-18 ENCOUNTER — Other Ambulatory Visit: Payer: Self-pay

## 2019-08-18 VITALS — BP 128/72 | HR 78 | Temp 98.2°F | Resp 18

## 2019-08-18 DIAGNOSIS — Z170 Estrogen receptor positive status [ER+]: Secondary | ICD-10-CM

## 2019-08-18 DIAGNOSIS — C50412 Malignant neoplasm of upper-outer quadrant of left female breast: Secondary | ICD-10-CM

## 2019-08-18 DIAGNOSIS — D509 Iron deficiency anemia, unspecified: Secondary | ICD-10-CM | POA: Diagnosis not present

## 2019-08-18 MED ORDER — CYANOCOBALAMIN 1000 MCG/ML IJ SOLN
1000.0000 ug | Freq: Once | INTRAMUSCULAR | Status: AC
  Administered 2019-08-18: 1000 ug via INTRAMUSCULAR

## 2019-08-18 NOTE — Patient Instructions (Signed)

## 2019-08-25 ENCOUNTER — Inpatient Hospital Stay: Payer: HMO

## 2019-08-25 ENCOUNTER — Other Ambulatory Visit: Payer: Self-pay | Admitting: Oncology

## 2019-08-25 ENCOUNTER — Other Ambulatory Visit: Payer: Self-pay

## 2019-08-25 VITALS — BP 150/73 | HR 77 | Resp 18

## 2019-08-25 DIAGNOSIS — Z170 Estrogen receptor positive status [ER+]: Secondary | ICD-10-CM

## 2019-08-25 DIAGNOSIS — C50412 Malignant neoplasm of upper-outer quadrant of left female breast: Secondary | ICD-10-CM

## 2019-08-25 DIAGNOSIS — D509 Iron deficiency anemia, unspecified: Secondary | ICD-10-CM | POA: Diagnosis not present

## 2019-08-25 MED ORDER — CYANOCOBALAMIN 1000 MCG/ML IJ SOLN
1000.0000 ug | Freq: Once | INTRAMUSCULAR | Status: AC
  Administered 2019-08-25: 1000 ug via INTRAMUSCULAR

## 2019-08-25 NOTE — Patient Instructions (Signed)
Cyanocobalamin, Pyridoxine, and Folate What is this medicine? A multivitamin containing folic acid, vitamin B6, and vitamin B12. This medicine may be used for other purposes; ask your health care provider or pharmacist if you have questions. COMMON BRAND NAME(S): AllanFol RX, AllanTex, Av-Vite FB, B Complex with Folic Acid, ComBgen, FaBB, Folamin, Folastin, Folbalin, Folbee, Folbic, Folcaps, Folgard, Folgard RX, Folgard RX 2.2, Folplex, Folplex 2.2, Foltabs 800, Foltx, Homocysteine Formula, Niva-Fol, NuFol, TL Gard RX, Virt-Gard, Virt-Vite, Virt-Vite Forte, Vita-Respa What should I tell my health care provider before I take this medicine? They need to know if you have any of these conditions:  bleeding or clotting disorder  history of anemia of any type  other chronic health condition  an unusual or allergic reaction to vitamins, other medicines, foods, dyes, or preservatives  pregnant or trying to get pregnant  breast-feeding How should I use this medicine? Take by mouth with a glass of water. May take with food. Follow the directions on the prescription label. It is usually given once a day. Do not take your medicine more often than directed. Contact your pediatrician regarding the use of this medicine in children. Special care may be needed. Overdosage: If you think you have taken too much of this medicine contact a poison control center or emergency room at once. NOTE: This medicine is only for you. Do not share this medicine with others. What if I miss a dose? If you miss a dose, take it as soon as you can. If it is almost time for your next dose, take only that dose. Do not take double or extra doses. What may interact with this medicine?  levodopa This list may not describe all possible interactions. Give your health care provider a list of all the medicines, herbs, non-prescription drugs, or dietary supplements you use. Also tell them if you smoke, drink alcohol, or use illegal  drugs. Some items may interact with your medicine. What should I watch for while using this medicine? See your health care professional for regular checks on your progress. Remember that vitamin supplements do not replace the need for good nutrition from a balanced diet. What side effects may I notice from receiving this medicine? Side effects that you should report to your doctor or health care professional as soon as possible:  allergic reaction such as skin rash or difficulty breathing  vomiting Side effects that usually do not require medical attention (report to your doctor or health care professional if they continue or are bothersome):  nausea  stomach upset This list may not describe all possible side effects. Call your doctor for medical advice about side effects. You may report side effects to FDA at 1-800-FDA-1088. Where should I keep my medicine? Keep out of the reach of children. Most vitamins should be stored at controlled room temperature. Check your specific product directions. Protect from heat and moisture. Throw away any unused medicine after the expiration date. NOTE: This sheet is a summary. It may not cover all possible information. If you have questions about this medicine, talk to your doctor, pharmacist, or health care provider.  2020 Elsevier/Gold Standard (2007-04-16 00:59:55)  

## 2019-09-21 ENCOUNTER — Inpatient Hospital Stay: Payer: HMO

## 2019-09-21 ENCOUNTER — Inpatient Hospital Stay: Payer: HMO | Attending: Oncology

## 2019-09-21 ENCOUNTER — Encounter: Payer: Self-pay | Admitting: Adult Health

## 2019-09-21 ENCOUNTER — Inpatient Hospital Stay (HOSPITAL_BASED_OUTPATIENT_CLINIC_OR_DEPARTMENT_OTHER): Payer: HMO | Admitting: Adult Health

## 2019-09-21 ENCOUNTER — Other Ambulatory Visit: Payer: Self-pay

## 2019-09-21 VITALS — BP 143/72 | HR 75 | Temp 97.8°F | Resp 16

## 2019-09-21 VITALS — BP 142/66 | HR 80 | Temp 98.3°F | Resp 18 | Ht 64.5 in | Wt 181.9 lb

## 2019-09-21 DIAGNOSIS — C50412 Malignant neoplasm of upper-outer quadrant of left female breast: Secondary | ICD-10-CM

## 2019-09-21 DIAGNOSIS — E538 Deficiency of other specified B group vitamins: Secondary | ICD-10-CM | POA: Diagnosis not present

## 2019-09-21 DIAGNOSIS — Z170 Estrogen receptor positive status [ER+]: Secondary | ICD-10-CM

## 2019-09-21 DIAGNOSIS — D508 Other iron deficiency anemias: Secondary | ICD-10-CM | POA: Diagnosis not present

## 2019-09-21 LAB — CBC WITH DIFFERENTIAL/PLATELET
Abs Immature Granulocytes: 0.02 10*3/uL (ref 0.00–0.07)
Basophils Absolute: 0 10*3/uL (ref 0.0–0.1)
Basophils Relative: 1 %
Eosinophils Absolute: 0.3 10*3/uL (ref 0.0–0.5)
Eosinophils Relative: 5 %
HCT: 39.5 % (ref 36.0–46.0)
Hemoglobin: 12.7 g/dL (ref 12.0–15.0)
Immature Granulocytes: 0 %
Lymphocytes Relative: 20 %
Lymphs Abs: 1.2 10*3/uL (ref 0.7–4.0)
MCH: 31.1 pg (ref 26.0–34.0)
MCHC: 32.2 g/dL (ref 30.0–36.0)
MCV: 96.8 fL (ref 80.0–100.0)
Monocytes Absolute: 0.6 10*3/uL (ref 0.1–1.0)
Monocytes Relative: 9 %
Neutro Abs: 3.9 10*3/uL (ref 1.7–7.7)
Neutrophils Relative %: 65 %
Platelets: 196 10*3/uL (ref 150–400)
RBC: 4.08 MIL/uL (ref 3.87–5.11)
RDW: 14.5 % (ref 11.5–15.5)
WBC: 6 10*3/uL (ref 4.0–10.5)
nRBC: 0 % (ref 0.0–0.2)

## 2019-09-21 LAB — COMPREHENSIVE METABOLIC PANEL
ALT: 15 U/L (ref 0–44)
AST: 12 U/L — ABNORMAL LOW (ref 15–41)
Albumin: 3.5 g/dL (ref 3.5–5.0)
Alkaline Phosphatase: 126 U/L (ref 38–126)
Anion gap: 10 (ref 5–15)
BUN: 11 mg/dL (ref 8–23)
CO2: 26 mmol/L (ref 22–32)
Calcium: 9.4 mg/dL (ref 8.9–10.3)
Chloride: 104 mmol/L (ref 98–111)
Creatinine, Ser: 0.88 mg/dL (ref 0.44–1.00)
GFR calc Af Amer: >60 mL/min (ref >60)
GFR calc non Af Amer: >60 mL/min (ref >60)
Glucose, Bld: 176 mg/dL — ABNORMAL HIGH (ref 70–99)
Potassium: 4.7 mmol/L (ref 3.5–5.1)
Sodium: 140 mmol/L (ref 135–145)
Total Bilirubin: 0.4 mg/dL (ref 0.3–1.2)
Total Protein: 6.8 g/dL (ref 6.5–8.1)

## 2019-09-21 MED ORDER — CYANOCOBALAMIN 1000 MCG/ML IJ SOLN
1000.0000 ug | Freq: Once | INTRAMUSCULAR | Status: AC
  Administered 2019-09-21: 1000 ug via INTRAMUSCULAR

## 2019-09-21 NOTE — Patient Instructions (Signed)

## 2019-09-21 NOTE — Progress Notes (Signed)
[place]  [phone] [phone]     ID: Kristine Parrish DOB: [birth year]  MR#: [id number]  WLN#:[id number]  Patient Care Team: Kristine Melter, MD as PCP - General (Family Medicine) Kristine Kussmaul, MD as Consulting Physician (General Surgery) Kristine Parrish, Kristine Dad, MD as Consulting Physician (Oncology) Kristine Gibson, MD as Attending Physician (Radiation Oncology) Kristine Donning, MD (Dermatology) Kristine Spates, MD (Inactive) as Consulting Physician (Gastroenterology) Kristine Parrish, DPM as Consulting Physician (Podiatry) Kristine Fus, MD as Consulting Physician (Obstetrics and Gynecology) Kristine Dresser, MD as Consulting Physician (Cardiology) Kristine Simmer, MD as Consulting Physician (Orthopedic Surgery) Kristine Ped, RN as [facility] Kristine Coder, MD as Consulting Physician (Pulmonary Disease) OTHER MD:  CHIEF COMPLAINT: Estrogen receptor positive breast cancer/iron and b12 deficiency  CURRENT TREATMENT: anastrozole; feraheme; b12   INTERVAL HISTORY: Kristine Parrish for follow-up of her estrogen receptor positive breast cancer and new h/o anemia.    She received two doses of feraheme on 08/04/2019 and 08/11/2019 and tolerated those well.  She also received b12 injection.  Her intrinsic factor and anti parietal antibodies were negative.  She has not taken any oral b12, and has it to start.    She is scheduled for a mammogram and a bone density in August.    REVIEW OF SYSTEMS: Kristine Parrish is doing well Parrish.  She is still fatigued.  She has had some left wrist issues, and is planning on seeing ortho for this soon.  She is as active as she can be with her right charcot foot.  She walks to and from the mailbox a few times per day, and notes she has a long driveway.  She follows with her pcp every 6 months.  She is up to date with colon cancer screening.  She notes she needs a skin check.    She denies any  cough, shortness of breath, headaches, nausea, vomiting, bowel/bladder changes, chest pain, or palpitations.  A detailed ROS was otherwise non contributory.    BREAST CANCER HISTORY: From the original intake note  Kristine Parrish herself noted a change in her left breast and brought it to the attention of Dr. Nori Riis, who set her up for bilateral diagnostic mammography with tomography and left breast ultrasonography at the [facility] 08/09/2015. The breast density was category D. In the area of palpable concern there was a mass with irregular margins and architectural distortion, measuring approximately 3 cm. This was palpable in the upper outer quadrant near the nipple. The ultrasound confirmed an irregular hypoechoic mass approximately 3 cm from the nipple at the 10:00 position measuring 2.7 cm. Ultrasound of the left axilla found a single level I left axillary node with focal cortical thickening.  On 08/13/2015 the patient underwent biopsy of the left breast mass and the suspicious left axillary lymph node, both showing invasive ductal carcinoma, grade 2 or 3, estrogen receptor 100% positive, progesterone receptor 100% positive, both with strong staining intensity, with an MIB-1 of 50%, and HER-2 amplification, the signals ratio being 2.2 to and the number per cell 3.55  The patient's subsequent history is as detailed below   PAST MEDICAL HISTORY: Past Medical History:  Diagnosis Date  . Arthritis    "hands "  . Breast cancer of upper-outer quadrant of left female breast ([place]) 08/15/2015  . Colon polyps   . Dyspnea    with exertion- "from chemo"  . GERD (gastroesophageal reflux disease)   . Hiatal hernia   .  History of bronchitis   . History of radiation therapy 05/19/16- 06/29/16   Left Breast 50 Gy in 25 fractions, SCLV/PAB 45 Gy in 25 fractions, Left Breast boost 10 Gy in 5 fractions.   . Hypertension   . Neuropathy    from chemopathy  . Peptic ulcer disease   . Peripheral neuropathy   .  Personal history of chemotherapy   . Personal history of radiation therapy   . Type 2 diabetes mellitus ([place])    Type II    PAST SURGICAL HISTORY: Past Surgical History:  Procedure Laterality Date  . APPENDECTOMY  1974  . BREAST LUMPECTOMY Left 02/14/2016  . cataract surgery Bilateral 04/2012  . CHOLECYSTECTOMY  1996  . COLONOSCOPY    . FOOT SURGERY     left bunion removed  . nerve removed from left foot  2000  . PORTACATH PLACEMENT N/A 09/05/2015   Procedure: INSERTION PORT-A-CATH;  Surgeon: Autumn Messing III, MD;  Location: [facility];  Service: General;  Laterality: N/A;  . RADIOACTIVE SEED GUIDED PARTIAL MASTECTOMY/AXILLARY SENTINEL NODE BIOPSY/AXILLARY NODE DISSECTION Left 02/14/2016   Procedure: LEFT BREAST RADIOACTIVE SEED GUIDED LUMPECTOMY WITH LEFT  SENTINEL LYMPH NODE BIOPSY AND LEFT  SEED TARGETED DISSECTION;  Surgeon: Autumn Messing III, MD;  Location: [facility];  Service: General;  Laterality: Left;  . TUBAL LIGATION    . VAGINAL HYSTERECTOMY  1995    FAMILY HISTORY Family History  Problem Relation Age of Onset  . Diabetes Mother   . Hypertension Mother   . Alcoholism Maternal Grandfather   . Congestive Heart Failure Maternal Grandmother   The patient has little information about her father and is not sure of his cause of death or age at death. The patient's mother died at age 50. The patient had no siblings. She was raised by her grandmother. She is not aware of any breast or ovarian cancer history in the family    GYNECOLOGIC HISTORY:  No LMP recorded. Patient has had a hysterectomy.  menarche age 83, first live birth age 52. The patient is GX P2. She underwent hysterectomy with bilateral salpingo-oophorectomy in 1994, and has been on estrogen replacement since that time, discontinued June 2017.    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  She is divorced and currently lives next door to her daughter, Kristine Parrish,  who lives in [place] and works as a Teacher, music for the [employer]. Son, Kristine Parrish lives in [place] where he is Secondary school teacher. The patient has one granddaughter, 39 years old as of August 2019.  The patient attends [employer]. The patient's daughter [name] is her healthcare power of attorney.    HEALTH MAINTENANCE: Social History   Tobacco Use  . Smoking status: Never Smoker  . Smokeless tobacco: Never Used  Substance Use Topics  . Alcohol use: No  . Drug use: No     Colonoscopy: 2012/[facility]  PAP:  Bone density:  06/24/2016 shows a T score of -0.7 (normal)  Lipid panel:  Allergies  Allergen Reactions  . No Known Allergies     Current Outpatient Medications  Medication Sig Dispense Refill  . albuterol (VENTOLIN HFA) 108 (90 Base) MCG/ACT inhaler Inhale 2 puffs into the lungs every 6 (six) hours as needed for wheezing or shortness of breath. 18 g 6  . anastrozole (ARIMIDEX) 1 MG tablet TAKE  1 TABLET BY MOUTH EVERY DAY 90 tablet 0  . aspirin 81 MG tablet Take 81 mg by mouth daily.    Marland Kitchen BREO ELLIPTA 100-25 MCG/INH AEPB INHALE 1 PUFF BY MOUTH EVERY DAY 60 each 12  . gabapentin (NEURONTIN) 800 MG tablet Take 800 mg by mouth 3 (three) times daily.    Marland Kitchen glipiZIDE (GLUCOTROL) 10 MG tablet Take 10 mg by mouth daily before breakfast.    . lovastatin (MEVACOR) 20 MG tablet Take 20 mg by mouth at bedtime.    . metFORMIN (GLUCOPHAGE) 500 MG tablet Take 500 mg by mouth 2 (two) times daily with a meal.     . Multiple Vitamin (MULTIVITAMIN) tablet Take 1 tablet by mouth daily.    . Omega-3 Fatty Acids (FISH OIL PO) Take 2 tablets by mouth. 2448m a day    . omeprazole (PRILOSEC) 20 MG capsule Take 20 mg by mouth daily.    . pioglitazone (ACTOS) 15 MG tablet Take 15 mg by mouth daily.    . Probiotic Product (PROBIOTIC DAILY PO) Take 1 tablet by mouth daily. Reported on 08/21/2015    . losartan (COZAAR) 25 MG  tablet Take 1 tablet (25 mg total) by mouth daily. 30 tablet 6   No current facility-administered medications for this visit.    OBJECTIVE: Middle-aged white woman in no acute distress Vitals:   09/21/19 0948  BP: (!) 142/66  Pulse: 80  Resp: 18  Temp: 98.3 F (36.8 C)  SpO2: 97%   Wt Readings from Last 3 Encounters:  09/21/19 181 lb 14.4 oz (82.5 kg)  07/13/19 180 lb 12.8 oz (82 kg)  05/24/19 180 lb 6.4 oz (81.8 kg)   Body mass index is 30.74 kg/m.    ECOG FS:2 - Symptomatic, <50% confined to bed GENERAL: Patient is a well appearing female in no acute distress HEENT:  Sclerae anicteric.  Mask in place. Neck is supple.  NODES:  No cervical, supraclavicular, or axillary lymphadenopathy palpated.  BREAST EXAM:  Left breast s/p lumpectomy, no sign of local recurrence, right breast benign LUNGS:  Clear to auscultation bilaterally.  No wheezes or rhonchi. HEART:  Regular rate and rhythm. No murmur appreciated. ABDOMEN:  Soft, nontender.  Positive, normoactive bowel sounds. No organomegaly palpated. MSK:  No focal spinal tenderness to palpation. Full range of motion bilaterally in the upper extremities. EXTREMITIES:  No peripheral edema.   SKIN:  Clear with no obvious rashes or skin changes. No nail dyscrasia. NEURO:  Nonfocal. Well oriented.  Appropriate affect.   LAB RESULTS:  CMP     Component Value Date/Time   NA 140 09/21/2019 0931   NA 140 12/28/2016 1043   K 4.7 09/21/2019 0931   K 4.1 12/28/2016 1043   CL 104 09/21/2019 0931   CO2 26 09/21/2019 0931   CO2 26 12/28/2016 1043   GLUCOSE 176 (H) 09/21/2019 0931   GLUCOSE 173 (H) 12/28/2016 1043   BUN 11 09/21/2019 0931   BUN 16.9 12/28/2016 1043   CREATININE 0.88 09/21/2019 0931   CREATININE 0.9 12/28/2016 1043   CALCIUM 9.4 09/21/2019 0931   CALCIUM 9.3 12/28/2016 1043   PROT 6.8 09/21/2019 0931   PROT 7.0 12/28/2016 1043   ALBUMIN 3.5 09/21/2019 0931   ALBUMIN 3.6 12/28/2016 1043   AST 12 (L) 09/21/2019  0931   AST 17 12/28/2016 1043   ALT 15 09/21/2019 0931   ALT 17 12/28/2016 1043   ALKPHOS 126 09/21/2019 0931   ALKPHOS 158 (H) 12/28/2016  1043   BILITOT 0.4 09/21/2019 0931   BILITOT 0.36 12/28/2016 1043   GFRNONAA >60 09/21/2019 0931   GFRAA >60 09/21/2019 0931    INo results found for: SPEP, UPEP  Lab Results  Component Value Date   WBC 6.0 09/21/2019   NEUTROABS 3.9 09/21/2019   HGB 12.7 09/21/2019   HCT 39.5 09/21/2019   MCV 96.8 09/21/2019   PLT 196 09/21/2019      Chemistry      Component Value Date/Time   NA 140 09/21/2019 0931   NA 140 12/28/2016 1043   K 4.7 09/21/2019 0931   K 4.1 12/28/2016 1043   CL 104 09/21/2019 0931   CO2 26 09/21/2019 0931   CO2 26 12/28/2016 1043   BUN 11 09/21/2019 0931   BUN 16.9 12/28/2016 1043   CREATININE 0.88 09/21/2019 0931   CREATININE 0.9 12/28/2016 1043      Component Value Date/Time   CALCIUM 9.4 09/21/2019 0931   CALCIUM 9.3 12/28/2016 1043   ALKPHOS 126 09/21/2019 0931   ALKPHOS 158 (H) 12/28/2016 1043   AST 12 (L) 09/21/2019 0931   AST 17 12/28/2016 1043   ALT 15 09/21/2019 0931   ALT 17 12/28/2016 1043   BILITOT 0.4 09/21/2019 0931   BILITOT 0.36 12/28/2016 1043     No results found for: LABCA2  No components found for: LABCA125  No results for input(s): INR in the last 168 hours.  Urinalysis No results found for: COLORURINE, APPEARANCEUR, LABSPEC, PHURINE, GLUCOSEU, HGBUR, BILIRUBINUR, KETONESUR, PROTEINUR, UROBILINOGEN, NITRITE, LEUKOCYTESUR   STUDIES: No results found.   ELIGIBLE FOR AVAILABLE RESEARCH PROTOCOL: Not eligible for PALLAS because HER-2 positive  ASSESSMENT: 74 y.o. [place] woman status post left breast upper outer quadrant and left axillary lymph node biopsy 08/13/2015 both positive for an invasive ductal carcinoma, grade 2 or 3, triple positive, with an MIB-1 of 50%  (1) neoadjuvant chemotherapy consisting of carboplatin, docetaxel, trastuzumab and pertuzumab given every 21  days Started 09/11/2015  (a) treatment changed to Abraxane and trastuzumab with cycle 2 because of side effects after cycle 1  (b) Abraxane discontinued after 3d dose (10/17/2015) because of peripheral neuropathy  (c) cyclophosphamide, methotrexate and fluorouracil (CMF) started 11/07/2015, 4 cycles, last dose 01/09/2016   (2) trastuzumab continued to complete a year (last dose September 24, 2016)  (a) final echocardiogram 07/23/2016 showed a well-preserved ejection fraction at 55-60%  (3) status post left lumpectomy with targeted axillary dissection 02/14/2016 showing a residual pT1c pN1 invasive ductal carcinoma, grade 2, estrogen and progesterone receptor positive, now HER-2 not amplified; margins were negative  (a) left axillary seroma/abscess drained surgically 03/25/2016, but persists  (4) adjuvant radiation 05/19/16 - 06/29/16 Site/dose:    Left breast - 4 field             Left breast: 50 Gy in 25 fractions             SCLV/PAB: 45 Gy in 25 fractions Left breast boost: 10 GY in 5 fractions  (5) anastrozole started 07/07/2016  (a) bone density 06/24/2016 shows a T score of -0.7 (normal)  (b) density August 2021  (6) Anemia related to decreased iron saturation and b12 deficiency  (a) Feraheme to be given x 2 starting on 08/04/2019  (b) b12 injections weekly x4 (interrupted slightly due to travel plans), then monthly  PLAN: Jordyan is doing well.  Her anemia is improved and her hemoglobin is now normal.  I reviewed with her that her intrinsic factor and  antiparietal antibody labs were normal.  This means that we could not detect any interference with her bodies ability to absorb B12.  In discussing this, she will try taking b12 1000 daily.  We will recheck this in October.  If her b12 is low, or low normal, we will restart the monthly injections.  She tolerated the iron infusions well.  We will check her iron studies in October.    She has no clinical or radiographic sign of breast cancer  recurrence.  She tolerates the Anastrozole and will continue on this.  She is due for mammogram and bone density in August, and we plan on seeing her afterward to discuss further.    She knows to call for any questions that may arise between now and her next appointment.  We are happy to see her sooner if needed.   Total encounter time 30 minutes.Wilber Bihari, NP 09/21/19 10:14 AM Medical Oncology and Hematology [facility] [place], [place] [address] Tel. [phone]    Fax. [phone]  *Total Encounter Time as defined by the Centers for Medicare and Medicaid Services includes, in addition to the face-to-face time of a patient visit (documented in the note above) non-face-to-face time: obtaining and reviewing outside history, ordering and reviewing medications, tests or procedures, care coordination (communications with other health care professionals or caregivers) and documentation in the medical record.

## 2019-09-22 ENCOUNTER — Telehealth: Payer: Self-pay | Admitting: Adult Health

## 2019-09-22 NOTE — Telephone Encounter (Signed)
Scheduled appts per 7/15 los. Pt confirmed appt date and time.

## 2019-10-03 DIAGNOSIS — M654 Radial styloid tenosynovitis [de Quervain]: Secondary | ICD-10-CM | POA: Diagnosis not present

## 2019-10-03 DIAGNOSIS — M25532 Pain in left wrist: Secondary | ICD-10-CM | POA: Diagnosis not present

## 2019-10-05 ENCOUNTER — Other Ambulatory Visit: Payer: Self-pay | Admitting: Oncology

## 2019-10-27 ENCOUNTER — Ambulatory Visit
Admission: RE | Admit: 2019-10-27 | Discharge: 2019-10-27 | Disposition: A | Discharge status: Home / Self Care | Payer: HMO | Source: Ambulatory Visit | Attending: Oncology | Admitting: Oncology

## 2019-10-27 ENCOUNTER — Other Ambulatory Visit: Payer: Self-pay

## 2019-10-27 DIAGNOSIS — C50412 Malignant neoplasm of upper-outer quadrant of left female breast: Secondary | ICD-10-CM

## 2019-10-27 DIAGNOSIS — Z780 Asymptomatic menopausal state: Secondary | ICD-10-CM | POA: Diagnosis not present

## 2019-10-27 DIAGNOSIS — R739 Hyperglycemia, unspecified: Secondary | ICD-10-CM

## 2019-10-27 DIAGNOSIS — R5381 Other malaise: Secondary | ICD-10-CM

## 2019-10-27 DIAGNOSIS — Z170 Estrogen receptor positive status [ER+]: Secondary | ICD-10-CM

## 2019-10-27 DIAGNOSIS — E11610 Type 2 diabetes mellitus with diabetic neuropathic arthropathy: Secondary | ICD-10-CM

## 2019-10-27 DIAGNOSIS — E119 Type 2 diabetes mellitus without complications: Secondary | ICD-10-CM

## 2019-10-27 DIAGNOSIS — Z853 Personal history of malignant neoplasm of breast: Secondary | ICD-10-CM | POA: Diagnosis not present

## 2019-10-27 DIAGNOSIS — R922 Inconclusive mammogram: Secondary | ICD-10-CM | POA: Diagnosis not present

## 2019-10-31 DIAGNOSIS — M654 Radial styloid tenosynovitis [de Quervain]: Secondary | ICD-10-CM | POA: Diagnosis not present

## 2019-11-03 ENCOUNTER — Other Ambulatory Visit: Payer: Self-pay

## 2019-11-03 NOTE — Patient Outreach (Signed)
[facility]) Care Management Chronic Special Needs Program  11/03/2019  Name: LATERRICA LIBMAN DOB: 1946/01/19  MRN: [id number]  Ms. Numa Heatwole is enrolled in a chronic special needs plan for Diabetes. Reviewed and updated care plan.  Subjective: Client states she is feeling much better since she had treatments for anemia.  States she is no longer short of breath and does not need to use inhalers.  States she is riding her exercise bike 3-4 times a week for 30-40 minutes along with doing yard work and walking to [employer].  States her Hemoglobin A1C has gone down to 6.9%.  States her blood sugars range from 94-123 in the morning.  Denies any low blood sugars. States she is trying to eat more vegetables and protein.  States her chronic foot pain from neuropathy and her Charcot foot has been bothering her more.  States her doctor might send her to a pain specialist but she does not want to take any narcotics.  Denies any falls.  States she has not completed her Advanced Directives but she has the forms.  States her B/P is good when she checks it at home.  States she has not joined [employer] yet as she has been so active at home but she is planning to go to the [facility] when it gets cold weather. States she has gotten both of her COVID shots  Goals Addressed              This Visit's Progress   .  COMPLETED: Client understands the importance of follow-up with providers by attending scheduled visits   On track     Keeping scheduled appointments Reinforced to keep scheduled appointments with providers Goal completed 11/03/19    .  Client will report improved coping with chronic foot pain by 6 months        Plan to discuss with your provider options for non-narcotic pain control Sent EMMI: Diabetic neuropathy, Chronic pain    .  Client will verbalize knowledge of self management of Hypertension as evidences by BP reading of 140/90 or less; or as defined by provider   On  track     Reports B/P less than 140/90 when checking at home and at providers office Plan to check B/P regularly Take B/P medications as ordered Plan to follow a low salt diet  Increase activity as tolerated    .  Diabetes Patient stated goal to enroll in Silver sneakers in the next 6 months( continue 11/03/19) (pt-stated)   Not on track     Reinforced to call to enroll in [place] at [phone] Reviewed importance of regular exercise to blood sugar control Reinforced to look into water aerobics at the [facility] and discussed other exercises which put less stress on her feet      .  HEMOGLOBIN A1C < 7        Last Hemoglobin A1C 6.9% on 06/15/19 Reinforced fasting blood sugar goals of 80-130 and less than 180 1 1/2-2 hours after meals Reinforced to follow a low carbohydrate low salt diet and to watch portion sizes Reinforced use and possible side effects of diabetes medications  Reviewed signs and symptoms of hypoglycemia and actions to take     .  COMPLETED: Maintain timely refills of diabetic medication as prescribed within the year .   On track     Maintaining timely refills of medications per dispense report Goal completed 11/03/19     .  Obtain annual  Lipid Profile, LDL-C   On track     Last completed 12/15/18 LDL 77 The goal for LDL is less than 70 mg/dL as you are at high risk for complications Try to avoid saturated fats, trans-fats and eat more fiber Plan to take statin as ordered    .  COMPLETED: Obtain Annual Eye (retinal)  Exam    On track     Last completed 04/05/19 Plan to have a dilated eye exam every year Goal completed 11/03/19    .  Obtain Annual Foot Exam   On track     Last completed 12/15/18 Check your skin and feet every day for cuts, bruises, redness, blisters, or sores. Report to provider any problems with your feet Schedule a foot exam with your health care provider once every year    .  COMPLETED: Obtain annual screen for micro albuminuria (urine) ,  nephropathy (kidney problems)   On track     Completed 06/15/19 It is important for your doctor to check your urine for protein at least every year Goal completed 11/03/19    .  Obtain Hemoglobin A1C at least 2 times per year   On track     Last completed 06/15/19 It is important to have your Hemoglobin A1C checked every 6 months if you are at goal and every 3 months if you are not at goal    .  Visit Primary Care Provider or Endocrinologist at least 2 times per year    On track     Primary care provider 06/15/19 Annual Wellness Visit 12/15/18 Please schedule your annual wellness visit       Plan:  Send successful outreach letter with a copy of their individualized care plan, Send individual care plan to provider and Send educational material EMMI: Diabetic neuropathy, Chronic pain  Client will be outreached by a [employer] ([employer]) RNCM in 6 months per tier level     [place], Jackquline Denmark, [place] Management Coordinator [place] Management [phone]

## 2019-11-29 ENCOUNTER — Ambulatory Visit: Payer: HMO

## 2019-12-15 DIAGNOSIS — L309 Dermatitis, unspecified: Secondary | ICD-10-CM | POA: Diagnosis not present

## 2019-12-15 DIAGNOSIS — E1149 Type 2 diabetes mellitus with other diabetic neurological complication: Secondary | ICD-10-CM | POA: Diagnosis not present

## 2019-12-15 DIAGNOSIS — E11610 Type 2 diabetes mellitus with diabetic neuropathic arthropathy: Secondary | ICD-10-CM | POA: Diagnosis not present

## 2019-12-15 DIAGNOSIS — G629 Polyneuropathy, unspecified: Secondary | ICD-10-CM | POA: Diagnosis not present

## 2019-12-15 DIAGNOSIS — Z794 Long term (current) use of insulin: Secondary | ICD-10-CM | POA: Diagnosis not present

## 2019-12-15 DIAGNOSIS — E7800 Pure hypercholesterolemia, unspecified: Secondary | ICD-10-CM | POA: Diagnosis not present

## 2019-12-15 DIAGNOSIS — I10 Essential (primary) hypertension: Secondary | ICD-10-CM | POA: Diagnosis not present

## 2019-12-15 DIAGNOSIS — E669 Obesity, unspecified: Secondary | ICD-10-CM | POA: Diagnosis not present

## 2019-12-15 DIAGNOSIS — K279 Peptic ulcer, site unspecified, unspecified as acute or chronic, without hemorrhage or perforation: Secondary | ICD-10-CM | POA: Diagnosis not present

## 2019-12-24 NOTE — Progress Notes (Signed)
[place]   [phone] [phone]   Clinic Follow up Note   Patient Care Team: Orpah Melter, MD as PCP - General (Family Medicine) Jovita Kussmaul, MD as Consulting Physician (General Surgery) Magrinat, Virgie Dad, MD as Consulting Physician (Oncology) Eppie Gibson, MD as Attending Physician (Radiation Oncology) Murvin Donning, MD (Dermatology) Laurence Spates, MD (Inactive) as Consulting Physician (Gastroenterology) Rosemary Holms, DPM as Consulting Physician (Podiatry) Maisie Fus, MD as Consulting Physician (Obstetrics and Gynecology) Larey Dresser, MD as Consulting Physician (Cardiology) Wylene Simmer, MD as Consulting Physician (Orthopedic Surgery) Dimitri Ped, RN as [facility] Laurin Coder, MD as Consulting Physician (Pulmonary Disease) 12/25/2019  CHIEF COMPLAINT: F/u ER+ breast cancer, IDA, B12 deficiency   SUMMARY OF ONCOLOGIC HISTORY: Oncology History  Malignant neoplasm of upper-outer quadrant of left breast in female, estrogen receptor positive ([place])  08/13/2015 Initial Biopsy   Left breast upper outer quadrant and left axillary lymph node biopsy: both positive for IDC, g2-3, ER+(100%), PR+(100%), KI-67 50%, HER-2 positive (ratio 2.36).    08/15/2015 Initial Diagnosis   Malignant neoplasm of upper-outer quadrant of left breast in female, estrogen receptor positive ([place])   09/11/2015 - 09/11/2015 Neo-Adjuvant Chemotherapy   Carboplatin, docetaxel, trastuzumab, and pertuzumab x 1, stopped due to difficulty tolerating   09/11/2015 - 09/24/2016 Adjuvant Chemotherapy   Trastuzumab for a year   10/03/2015 - 10/17/2015 Neo-Adjuvant Chemotherapy   Abraxane, abraxane stopped after three cycles due to peripheral neuropathy   11/07/2015 - 01/09/2016 Neo-Adjuvant Chemotherapy   CMF x 4 cycles   02/14/2016 Surgery   Left lumpectomy: IDC, grade 2, 1.3 cm, repeat HER-2 negative, margins negative, 1/5  SLN +, ypT1c, N1a   05/19/2016 - 06/29/2016 Radiation Therapy   Adjuvant radiation Isidore Moos): 1) Left breast - 4 field Left breast: 50 Gy in 25 fractions SCLV/PAB: 45 Gy in 25 fractions 2) Left breast boost: 10 Gy in 5 fractions   07/2016 -  Anti-estrogen oral therapy   Anastrozole daily.  Bone density 06/24/2016 demonstrates T score of -0.7 (normal)     CURRENT THERAPY: Anastrozole, Feraheme, B12   INTERVAL HISTORY: Ms Josten returns for f/u as scheduled. She was last seen 09/21/19 by Wilber Bihari, NP. Mammogram 10/27/19 showed no evidence of malignancy and a chronic left axillary seroma. DEXA showed normal BMD.  She is doing well overall.  She continues anastrozole, tolerating "okay," stable back issues pre-dated AI.  She wishes her energy was better and she could lose some weight.  She is not on oral iron, continues oral B12 once daily.  She has exercise limitations from a right foot condition.  Denies recent fever, chills, cough, chest pain, dyspnea, leg edema, GI/GYN bleeding, hot flashes, mood changes.  She has a rash on her back her PCP told her it is eczema.   MEDICAL HISTORY:  Past Medical History:  Diagnosis Date  . Arthritis    "hands "  . Breast cancer of upper-outer quadrant of left female breast ([place]) 08/15/2015  . Colon polyps   . Dyspnea    with exertion- "from chemo"  . GERD (gastroesophageal reflux disease)   . Hiatal hernia   . History of bronchitis   . History of radiation therapy 05/19/16- 06/29/16   Left Breast 50 Gy in 25 fractions, SCLV/PAB 45 Gy in 25 fractions, Left Breast boost 10 Gy in 5 fractions.   . Hypertension   . Neuropathy    from chemopathy  . Peptic  ulcer disease   . Peripheral neuropathy   . Personal history of chemotherapy   . Personal history of radiation therapy   . Type 2 diabetes mellitus ([place])    Type II    SURGICAL HISTORY: Past Surgical History:  Procedure Laterality Date  . APPENDECTOMY  1974  . BREAST LUMPECTOMY Left 02/14/2016  .  cataract surgery Bilateral 04/2012  . CHOLECYSTECTOMY  1996  . COLONOSCOPY    . FOOT SURGERY     left bunion removed  . nerve removed from left foot  2000  . PORTACATH PLACEMENT N/A 09/05/2015   Procedure: INSERTION PORT-A-CATH;  Surgeon: Autumn Messing III, MD;  Location: [facility];  Service: General;  Laterality: N/A;  . RADIOACTIVE SEED GUIDED PARTIAL MASTECTOMY/AXILLARY SENTINEL NODE BIOPSY/AXILLARY NODE DISSECTION Left 02/14/2016   Procedure: LEFT BREAST RADIOACTIVE SEED GUIDED LUMPECTOMY WITH LEFT  SENTINEL LYMPH NODE BIOPSY AND LEFT  SEED TARGETED DISSECTION;  Surgeon: Autumn Messing III, MD;  Location: [facility];  Service: General;  Laterality: Left;  . TUBAL LIGATION    . VAGINAL HYSTERECTOMY  1995    I have reviewed the social history and family history with the patient and they are unchanged from previous note.  ALLERGIES:  has no active allergies.  MEDICATIONS:  Current Outpatient Medications  Medication Sig Dispense Refill  . anastrozole (ARIMIDEX) 1 MG tablet Take 1 tablet (1 mg total) by mouth daily. 90 tablet 0  . aspirin 81 MG tablet Take 81 mg by mouth daily.    Marland Kitchen gabapentin (NEURONTIN) 800 MG tablet Take 800 mg by mouth 3 (three) times daily.    Marland Kitchen glipiZIDE (GLUCOTROL) 10 MG tablet Take 10 mg by mouth daily before breakfast.    . lovastatin (MEVACOR) 20 MG tablet Take 20 mg by mouth at bedtime.    . metFORMIN (GLUCOPHAGE) 500 MG tablet Take 500 mg by mouth 2 (two) times daily with a meal.     . Multiple Vitamin (MULTIVITAMIN) tablet Take 1 tablet by mouth daily.    . Omega-3 Fatty Acids (FISH OIL PO) Take 2 tablets by mouth. 2453m a day    . omeprazole (PRILOSEC) 20 MG capsule Take 20 mg by mouth daily.    . pioglitazone (ACTOS) 15 MG tablet Take 15 mg by mouth daily.    . Probiotic Product (PROBIOTIC DAILY PO) Take 1 tablet by mouth daily. Reported on 08/21/2015    . albuterol (VENTOLIN HFA) 108 (90 Base) MCG/ACT inhaler Inhale 2 puffs into the lungs every 6 (six) hours as needed for  wheezing or shortness of breath. 18 g 6  . BREO ELLIPTA 100-25 MCG/INH AEPB INHALE 1 PUFF BY MOUTH EVERY DAY 60 each 12  . losartan (COZAAR) 25 MG tablet Take 1 tablet (25 mg total) by mouth daily. 30 tablet 6   No current facility-administered medications for this visit.    PHYSICAL EXAMINATION: ECOG PERFORMANCE STATUS: 0 - Asymptomatic  Vitals:   12/25/19 1115  BP: 135/77  Pulse: 83  Resp: 18  Temp: (!) 97 F (36.1 C)  SpO2: 96%   Filed Weights   12/25/19 1115  Weight: 182 lb 12.8 oz (82.9 kg)    GENERAL:alert, no distress and comfortable SKIN: Localized maculopapular rash to the right mid back extending down to the buttock, no blisters or vesicles EYES:  sclera clear NECK: Without mass LYMPH:  no palpable cervical or supraclavicular lymphadenopathy  LUNGS:  normal breathing effort HEART:  no lower extremity edema NEURO: alert & oriented x 3  with fluent speech Breast exam: S/p left lumpectomy, incisions completely healed.  There is a palpable left axillary seroma, no other palpable mass in either breast or axilla that I could appreciate  LABORATORY DATA:  I have reviewed the data as listed CBC Latest Ref Rng & Units 12/25/2019 09/21/2019 08/04/2019  WBC 4.0 - 10.5 K/uL 6.1 6.0 6.2  Hemoglobin 12.0 - 15.0 g/dL 12.4 12.7 11.5(L)  Hematocrit 36 - 46 % 37.9 39.5 36.6  Platelets 150 - 400 K/uL 171 196 213     CMP Latest Ref Rng & Units 12/25/2019 09/21/2019 08/04/2019  Glucose 70 - 99 mg/dL 170(H) 176(H) 139(H)  BUN 8 - 23 mg/dL 10 11 13   Creatinine 0.44 - 1.00 mg/dL 0.80 0.88 0.80  Sodium 135 - 145 mmol/L 140 140 140  Potassium 3.5 - 5.1 mmol/L 3.4(L) 4.7 3.8  Chloride 98 - 111 mmol/L 106 104 107  CO2 22 - 32 mmol/L 28 26 21(L)  Calcium 8.9 - 10.3 mg/dL 9.3 9.4 9.3  Total Protein 6.5 - 8.1 g/dL 6.8 6.8 7.0  Total Bilirubin 0.3 - 1.2 mg/dL 0.3 0.4 0.4  Alkaline Phos 38 - 126 U/L 139(H) 126 134(H)  AST 15 - 41 U/L 13(L) 12(L) 13(L)  ALT 0 - 44 U/L 17 15 15        RADIOGRAPHIC STUDIES: I have personally reviewed the radiological images as listed and agreed with the findings in the report. No results found.   ASSESSMENT & PLAN: 74 y.o. postmenopausal female   1.  Malignant neoplasm of the upper outer quadrant of left breast  -Diagnosed 08/13/2015 on left breast and left axillary lymph node biopsy, both positive for an invasive ductal carcinoma, grade 2 or 3, triple positive, with an MIB-1 of 50% -Treated with neoadjuvant chemotherapy, initially TCHP starting 09/11/2015, changed to Abraxane and trastuzumab with cycle 2 due to tolerance issues, then Abraxane discontinued after third dose (10/17/2015) due to neuropathy.  Eventually completed 4 cycles of CMF from 11/07/2015-01/09/2016 -S/p left lumpectomy with targeted axillary dissection 02/14/2016, path showed a residual pT1c pN1 invasive ductal carcinoma, grade 2, estrogen and progesterone receptor positive, now HER-2 not amplified; margins were negative -S/p adjuvant radiation to left breast 05/19/2016-06/29/2016 -S/p a year of maintenance trastuzumab until 09/24/2017 -Began adjuvant AI with anastrozole 07/07/2016  2.  Bone health: -Baseline BMD normal in 06/2016 -Repeat DEXA 10/2019 also normal, continue vitamin D and multivitamin with calcium, continue weightbearing exercises as tolerated  3.  Anemia of iron deficiency and B12 deficiency -S/p Feraheme x2 in 07/27/2019 and 08/27/2019 -B12 injections weekly x5 07/14/2018 1-6/18/2021, then an additional injection on 09/21/2019, now on oral B12 once daily  Disposition: Ms. Raju is clinically doing well.  She continues anastrozole which she tolerates well overall.  Breast exam shows a left axillary seroma, no other palpable mass.  Annual mammogram in 8/21 shows stable seroma, no evidence of malignancy.  Overall no clinical concern for breast cancer recurrence.  We reviewed the labs from today.  She responded well to IV iron and B12 inj, anemia resolved, CMP is  stable.  B12 level is pending, if normal she will continue oral B12 once daily.  We reviewed her DEXA in 10/2019 is normal, she continues vitamin D and multivitamin with calcium.  We discussed weightbearing exercises.  Repeat in 2 years.  She will continue breast cancer surveillance and AI, with annual mammograms in August.  She will see her PCP again in 6 months, I recommend to obtain CBC, iron/TIBC, ferritin,  and B12 and fax results to [place].  We will see her back for annual breast cancer surveillance visit in 1 year, or sooner if needed for new breast concern, med side effects, or recurrent IDA/B12 anemia.  All questions were answered. The patient knows to call the clinic with any problems, questions or concerns. No barriers to learning were detected.  Total encounter time was 30 minutes.     Alla Feeling, NP 12/25/19

## 2019-12-25 ENCOUNTER — Inpatient Hospital Stay: Payer: HMO

## 2019-12-25 ENCOUNTER — Inpatient Hospital Stay: Payer: HMO | Attending: Oncology | Admitting: Nurse Practitioner

## 2019-12-25 ENCOUNTER — Other Ambulatory Visit: Payer: Self-pay

## 2019-12-25 ENCOUNTER — Telehealth: Payer: Self-pay

## 2019-12-25 ENCOUNTER — Encounter: Payer: Self-pay | Admitting: Nurse Practitioner

## 2019-12-25 VITALS — BP 135/77 | HR 83 | Temp 97.0°F | Resp 18 | Ht 64.5 in | Wt 182.8 lb

## 2019-12-25 DIAGNOSIS — Z9221 Personal history of antineoplastic chemotherapy: Secondary | ICD-10-CM | POA: Insufficient documentation

## 2019-12-25 DIAGNOSIS — Z170 Estrogen receptor positive status [ER+]: Secondary | ICD-10-CM | POA: Insufficient documentation

## 2019-12-25 DIAGNOSIS — D509 Iron deficiency anemia, unspecified: Secondary | ICD-10-CM | POA: Insufficient documentation

## 2019-12-25 DIAGNOSIS — Z86010 Personal history of colonic polyps: Secondary | ICD-10-CM | POA: Diagnosis not present

## 2019-12-25 DIAGNOSIS — N6489 Other specified disorders of breast: Secondary | ICD-10-CM | POA: Insufficient documentation

## 2019-12-25 DIAGNOSIS — C50412 Malignant neoplasm of upper-outer quadrant of left female breast: Secondary | ICD-10-CM | POA: Diagnosis not present

## 2019-12-25 DIAGNOSIS — E538 Deficiency of other specified B group vitamins: Secondary | ICD-10-CM | POA: Diagnosis not present

## 2019-12-25 DIAGNOSIS — Z79811 Long term (current) use of aromatase inhibitors: Secondary | ICD-10-CM | POA: Insufficient documentation

## 2019-12-25 DIAGNOSIS — Z923 Personal history of irradiation: Secondary | ICD-10-CM | POA: Diagnosis not present

## 2019-12-25 DIAGNOSIS — D508 Other iron deficiency anemias: Secondary | ICD-10-CM

## 2019-12-25 LAB — CMP (CANCER CENTER ONLY)
ALT: 17 U/L (ref 0–44)
AST: 13 U/L — ABNORMAL LOW (ref 15–41)
Albumin: 3.6 g/dL (ref 3.5–5.0)
Alkaline Phosphatase: 139 U/L — ABNORMAL HIGH (ref 38–126)
Anion gap: 6 (ref 5–15)
BUN: 10 mg/dL (ref 8–23)
CO2: 28 mmol/L (ref 22–32)
Calcium: 9.3 mg/dL (ref 8.9–10.3)
Chloride: 106 mmol/L (ref 98–111)
Creatinine: 0.8 mg/dL (ref 0.44–1.00)
GFR, Estimated: >60 mL/min (ref >60)
Glucose, Bld: 170 mg/dL — ABNORMAL HIGH (ref 70–99)
Potassium: 3.4 mmol/L — ABNORMAL LOW (ref 3.5–5.1)
Sodium: 140 mmol/L (ref 135–145)
Total Bilirubin: 0.3 mg/dL (ref 0.3–1.2)
Total Protein: 6.8 g/dL (ref 6.5–8.1)

## 2019-12-25 LAB — CBC WITH DIFFERENTIAL (CANCER CENTER ONLY)
Abs Immature Granulocytes: 0.04 10*3/uL (ref 0.00–0.07)
Basophils Absolute: 0 10*3/uL (ref 0.0–0.1)
Basophils Relative: 1 %
Eosinophils Absolute: 0.3 10*3/uL (ref 0.0–0.5)
Eosinophils Relative: 5 %
HCT: 37.9 % (ref 36.0–46.0)
Hemoglobin: 12.4 g/dL (ref 12.0–15.0)
Immature Granulocytes: 1 %
Lymphocytes Relative: 19 %
Lymphs Abs: 1.2 10*3/uL (ref 0.7–4.0)
MCH: 31.2 pg (ref 26.0–34.0)
MCHC: 32.7 g/dL (ref 30.0–36.0)
MCV: 95.5 fL (ref 80.0–100.0)
Monocytes Absolute: 0.4 10*3/uL (ref 0.1–1.0)
Monocytes Relative: 7 %
Neutro Abs: 4.1 10*3/uL (ref 1.7–7.7)
Neutrophils Relative %: 67 %
Platelet Count: 171 10*3/uL (ref 150–400)
RBC: 3.97 MIL/uL (ref 3.87–5.11)
RDW: 12.7 % (ref 11.5–15.5)
WBC Count: 6.1 10*3/uL (ref 4.0–10.5)
nRBC: 0 % (ref 0.0–0.2)

## 2019-12-25 LAB — IRON AND TIBC
Iron: 70 ug/dL (ref 41–142)
Saturation Ratios: 28 % (ref 21–57)
TIBC: 248 ug/dL (ref 236–444)
UIBC: 177 ug/dL (ref 120–384)

## 2019-12-25 LAB — VITAMIN B12: Vitamin B-12: 2060 pg/mL — ABNORMAL HIGH (ref 180–914)

## 2019-12-25 LAB — FERRITIN: Ferritin: 230 ng/mL (ref 11–307)

## 2019-12-25 MED ORDER — ANASTROZOLE 1 MG PO TABS
1.0000 mg | ORAL_TABLET | Freq: Every day | ORAL | 0 refills | Status: DC
Start: 2019-12-25 — End: 2020-03-22

## 2019-12-25 NOTE — Telephone Encounter (Signed)
-----   Message from Alla Feeling, NP sent at 12/25/2019 12:52 PM EDT ----- [name], please fax my note to her PCP Dr. Christella Noa. Please request to draw CBC, iron/TIBC, ferritin, B12 at next visit in 6 months and fax to [place]. We can see her back if she has recurrent IDA/B12 anemia.  Otherwise plan to see her back in 1 year for breast cancer surveillance visit.   (Magrinat patient) Thanks, Regan Rakers

## 2019-12-25 NOTE — Telephone Encounter (Signed)
Faxed over [place] NP office note per [facility] NP.

## 2019-12-26 ENCOUNTER — Telehealth: Payer: Self-pay | Admitting: Nurse Practitioner

## 2019-12-26 NOTE — Telephone Encounter (Signed)
Scheduled per 10/18 los. Mailing pt appt calendar. 

## 2020-02-07 ENCOUNTER — Other Ambulatory Visit: Payer: Self-pay

## 2020-02-07 NOTE — Patient Outreach (Signed)
  [facility]) Care Management Chronic Special Needs Program    02/07/2020  Name: BROOKLEE MICHELIN, DOB: Jun 09, 1945  MRN: [id number]   Ms. Hiya Point is enrolled in a chronic special needs plan for Diabetes.  [facility] Management will continue to provide services for this client through 03/08/2020. [facility] care management team will assume care 03/09/2020.    Peter Garter RN, Jackquline Denmark, CDE Chronic Care Management Coordinator [facility] Care Management [phone]

## 2020-03-13 ENCOUNTER — Other Ambulatory Visit: Payer: Self-pay

## 2020-03-22 ENCOUNTER — Other Ambulatory Visit: Payer: Self-pay | Admitting: Nurse Practitioner

## 2020-04-15 DIAGNOSIS — C50919 Malignant neoplasm of unspecified site of unspecified female breast: Secondary | ICD-10-CM | POA: Diagnosis not present

## 2020-04-15 DIAGNOSIS — I10 Essential (primary) hypertension: Secondary | ICD-10-CM | POA: Diagnosis not present

## 2020-04-15 DIAGNOSIS — E11610 Type 2 diabetes mellitus with diabetic neuropathic arthropathy: Secondary | ICD-10-CM | POA: Diagnosis not present

## 2020-04-15 DIAGNOSIS — D509 Iron deficiency anemia, unspecified: Secondary | ICD-10-CM | POA: Diagnosis not present

## 2020-04-15 DIAGNOSIS — E1149 Type 2 diabetes mellitus with other diabetic neurological complication: Secondary | ICD-10-CM | POA: Diagnosis not present

## 2020-04-15 DIAGNOSIS — E7800 Pure hypercholesterolemia, unspecified: Secondary | ICD-10-CM | POA: Diagnosis not present

## 2020-04-16 DIAGNOSIS — R300 Dysuria: Secondary | ICD-10-CM | POA: Diagnosis not present

## 2020-04-22 DIAGNOSIS — R300 Dysuria: Secondary | ICD-10-CM | POA: Diagnosis not present

## 2020-05-06 NOTE — Progress Notes (Signed)
[facility]  [phone] [phone]     ID: ALYZABETH PONTILLO DOB: 23-Feb-1946  MR#: [id number]  OIB#:[id number]  Patient Care Team: Orpah Melter, MD as PCP - General (Family Medicine) Jovita Kussmaul, MD as Consulting Physician (General Surgery) , Virgie Dad, MD as Consulting Physician (Oncology) Eppie Gibson, MD as Attending Physician (Radiation Oncology) Murvin Donning, MD (Dermatology) Laurence Spates, MD (Inactive) as Consulting Physician (Gastroenterology) Rosemary Holms, DPM as Consulting Physician (Podiatry) Maisie Fus, MD as Consulting Physician (Obstetrics and Gynecology) Larey Dresser, MD as Consulting Physician (Cardiology) Wylene Simmer, MD as Consulting Physician (Orthopedic Surgery) Laurin Coder, MD as Consulting Physician (Pulmonary Disease) OTHER MD:  CHIEF COMPLAINT: Estrogen receptor positive breast cancer/iron and b12 deficiency  CURRENT TREATMENT: anastrozole;    INTERVAL HISTORY: Iylah returns today for follow-up of her estrogen receptor positive breast cancer and new h/o anemia.    She continues on anastrozole.  She is currently tolerating this with no side effects that she is aware of  Her most recent bone density screening from 10/27/2019 showed a T-score of -0.9, which is considered normal.   REVIEW OF SYSTEMS: [place] is worried about her weight.  She is doing some exercise, on a stationary bike and doing chair stretches.  She does confess to some dietary indiscretions although her diet overall does not sound terribly bad.  A detailed review of systems was otherwise benign   COVID 19 VACCINATION STATUS: Status post dozing x2  booster   BREAST CANCER HISTORY: From the original intake note  Nohemy herself noted a change in her left breast and brought it to the attention of Dr. Nori Riis, who set her up for bilateral diagnostic mammography with tomography and left breast ultrasonography at the [facility]  08/09/2015. The breast density was category D. In the area of palpable concern there was a mass with irregular margins and architectural distortion, measuring approximately 3 cm. This was palpable in the upper outer quadrant near the nipple. The ultrasound confirmed an irregular hypoechoic mass approximately 3 cm from the nipple at the 10:00 position measuring 2.7 cm. Ultrasound of the left axilla found a single level I left axillary node with focal cortical thickening.  On 08/13/2015 the patient underwent biopsy of the left breast mass and the suspicious left axillary lymph node, both showing invasive ductal carcinoma, grade 2 or 3, estrogen receptor 100% positive, progesterone receptor 100% positive, both with strong staining intensity, with an MIB-1 of 50%, and HER-2 amplification, the signals ratio being 2.2 to and the number per cell 3.55  The patient's subsequent history is as detailed below   PAST MEDICAL HISTORY: Past Medical History:  Diagnosis Date  . Arthritis    "hands "  . Breast cancer of upper-outer quadrant of left female breast ([facility]) 08/15/2015  . Colon polyps   . Dyspnea    with exertion- "from chemo"  . GERD (gastroesophageal reflux disease)   . Hiatal hernia   . History of bronchitis   . History of radiation therapy 05/19/16- 06/29/16   Left Breast 50 Gy in 25 fractions, SCLV/PAB 45 Gy in 25 fractions, Left Breast boost 10 Gy in 5 fractions.   . Hypertension   . Neuropathy    from chemopathy  . Peptic ulcer disease   . Peripheral neuropathy   . Personal history of chemotherapy   . Personal history of radiation therapy   . Type 2 diabetes mellitus (HCC)    Type II  PAST SURGICAL HISTORY: Past Surgical History:  Procedure Laterality Date  . APPENDECTOMY  1974  . BREAST LUMPECTOMY Left 02/14/2016  . cataract surgery Bilateral 04/2012  . CHOLECYSTECTOMY  1996  . COLONOSCOPY    . FOOT SURGERY     left bunion removed  . nerve removed from left foot  2000  .  PORTACATH PLACEMENT N/A 09/05/2015   Procedure: INSERTION PORT-A-CATH;  Surgeon: Autumn Messing III, MD;  Location: [facility];  Service: General;  Laterality: N/A;  . RADIOACTIVE SEED GUIDED PARTIAL MASTECTOMY/AXILLARY SENTINEL NODE BIOPSY/AXILLARY NODE DISSECTION Left 02/14/2016   Procedure: LEFT BREAST RADIOACTIVE SEED GUIDED LUMPECTOMY WITH LEFT  SENTINEL LYMPH NODE BIOPSY AND LEFT  SEED TARGETED DISSECTION;  Surgeon: Autumn Messing III, MD;  Location: [facility];  Service: General;  Laterality: Left;  . TUBAL LIGATION    . VAGINAL HYSTERECTOMY  1995    FAMILY HISTORY Family History  Problem Relation Age of Onset  . Diabetes Mother   . Hypertension Mother   . Alcoholism Maternal Grandfather   . Congestive Heart Failure Maternal Grandmother   The patient has little information about her father and is not sure of his cause of death or age at death. The patient's mother died at age 73. The patient had no siblings. She was raised by her grandmother. She is not aware of any breast or ovarian cancer history in the family    GYNECOLOGIC HISTORY:  No LMP recorded. Patient has had a hysterectomy.  menarche age 33, first live birth age 33. The patient is GX P2. She underwent hysterectomy with bilateral salpingo-oophorectomy in 1994, and has been on estrogen replacement since that time, discontinued June 2017.    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  She is divorced and currently lives next door to her daughter, [name], who lives in [place] and works as a Teacher, music for the [employer]. Son, Sekai Nayak lives in [place] where he is Secondary school teacher. The patient has one granddaughter, 30 years old as of August 2019.  The patient attends [employer]. The patient's daughter [name] is her healthcare power of attorney.    HEALTH MAINTENANCE: Social History   Tobacco  Use  . Smoking status: Never Smoker  . Smokeless tobacco: Never Used  Substance Use Topics  . Alcohol use: No  . Drug use: No     Colonoscopy: 2012/[facility]  PAP:  Bone density:  06/24/2016 shows a T score of -0.7 (normal)  Lipid panel:  No Active Allergies  Current Outpatient Medications  Medication Sig Dispense Refill  . albuterol (VENTOLIN HFA) 108 (90 Base) MCG/ACT inhaler Inhale 2 puffs into the lungs every 6 (six) hours as needed for wheezing or shortness of breath. 18 g 6  . anastrozole (ARIMIDEX) 1 MG tablet TAKE 1 TABLET BY MOUTH EVERY DAY 90 tablet 0  . aspirin 81 MG tablet Take 81 mg by mouth daily.    Marland Kitchen BREO ELLIPTA 100-25 MCG/INH AEPB INHALE 1 PUFF BY MOUTH EVERY DAY 60 each 12  . gabapentin (NEURONTIN) 800 MG tablet Take 800 mg by mouth 3 (three) times daily.    Marland Kitchen glipiZIDE (GLUCOTROL) 10 MG tablet Take 10 mg by mouth daily before breakfast.    . losartan (COZAAR) 25 MG tablet Take 1 tablet (25 mg total) by mouth daily. 30 tablet 6  . lovastatin (MEVACOR)  20 MG tablet Take 20 mg by mouth at bedtime.    . metFORMIN (GLUCOPHAGE) 500 MG tablet Take 500 mg by mouth 2 (two) times daily with a meal.     . Multiple Vitamin (MULTIVITAMIN) tablet Take 1 tablet by mouth daily.    . Omega-3 Fatty Acids (FISH OIL PO) Take 2 tablets by mouth. 2438m a day    . omeprazole (PRILOSEC) 20 MG capsule Take 20 mg by mouth daily.    . pioglitazone (ACTOS) 15 MG tablet Take 15 mg by mouth daily.    . Probiotic Product (PROBIOTIC DAILY PO) Take 1 tablet by mouth daily. Reported on 08/21/2015     No current facility-administered medications for this visit.    OBJECTIVE: White woman who appears stated age V63   05/07/20 1023  BP: (!) 151/67  Pulse: 77  Resp: 18  Temp: (!) 97.3 F (36.3 C)  SpO2: 99%   Wt Readings from Last 3 Encounters:  05/07/20 185 lb 3.2 oz (84 kg)  12/25/19 182 lb 12.8 oz (82.9 kg)  09/21/19 181 lb 14.4 oz (82.5 kg)   Body mass index is 31.3 kg/m.     ECOG FS:2 - Symptomatic, <50% confined to bed  Sclerae unicteric, EOMs intact Wearing a mask No cervical or supraclavicular adenopathy Lungs no rales or rhonchi Heart regular rate and rhythm Abd soft, nontender, positive bowel sounds MSK no focal spinal tenderness, no upper extremity lymphedema Neuro: nonfocal, well oriented, appropriate affect Breasts: The right breast is unremarkable.  The left breast is status post lumpectomy.  There is no evidence of residual or recurrent disease.  Both axillae are benign.   LAB RESULTS:  CMP     Component Value Date/Time   NA 144 05/07/2020 0952   NA 140 12/28/2016 1043   K 4.1 05/07/2020 0952   K 4.1 12/28/2016 1043   CL 107 05/07/2020 0952   CO2 27 05/07/2020 0952   CO2 26 12/28/2016 1043   GLUCOSE 142 (H) 05/07/2020 0952   GLUCOSE 173 (H) 12/28/2016 1043   BUN 10 05/07/2020 0952   BUN 16.9 12/28/2016 1043   CREATININE 0.79 05/07/2020 0952   CREATININE 0.9 12/28/2016 1043   CALCIUM 9.1 05/07/2020 0952   CALCIUM 9.3 12/28/2016 1043   PROT 6.8 05/07/2020 0952   PROT 7.0 12/28/2016 1043   ALBUMIN 3.7 05/07/2020 0952   ALBUMIN 3.6 12/28/2016 1043   AST 16 05/07/2020 0952   AST 17 12/28/2016 1043   ALT 21 05/07/2020 0952   ALT 17 12/28/2016 1043   ALKPHOS 147 (H) 05/07/2020 0952   ALKPHOS 158 (H) 12/28/2016 1043   BILITOT 0.3 05/07/2020 0952   BILITOT 0.36 12/28/2016 1043   GFRNONAA >60 05/07/2020 0952   GFRAA >60 09/21/2019 0931    INo results found for: SPEP, UPEP  Lab Results  Component Value Date   WBC 5.4 05/07/2020   NEUTROABS 3.8 05/07/2020   HGB 12.6 05/07/2020   HCT 39.2 05/07/2020   MCV 96.3 05/07/2020   PLT 189 05/07/2020      Chemistry      Component Value Date/Time   NA 144 05/07/2020 0952   NA 140 12/28/2016 1043   K 4.1 05/07/2020 0952   K 4.1 12/28/2016 1043   CL 107 05/07/2020 0952   CO2 27 05/07/2020 0952   CO2 26 12/28/2016 1043   BUN 10 05/07/2020 0952   BUN 16.9 12/28/2016 1043    CREATININE 0.79 05/07/2020 0952   CREATININE 0.9 12/28/2016  1043      Component Value Date/Time   CALCIUM 9.1 05/07/2020 0952   CALCIUM 9.3 12/28/2016 1043   ALKPHOS 147 (H) 05/07/2020 0952   ALKPHOS 158 (H) 12/28/2016 1043   AST 16 05/07/2020 0952   AST 17 12/28/2016 1043   ALT 21 05/07/2020 0952   ALT 17 12/28/2016 1043   BILITOT 0.3 05/07/2020 0952   BILITOT 0.36 12/28/2016 1043     No results found for: LABCA2  No components found for: LABCA125  No results for input(s): INR in the last 168 hours.  Urinalysis No results found for: COLORURINE, APPEARANCEUR, LABSPEC, PHURINE, GLUCOSEU, HGBUR, BILIRUBINUR, KETONESUR, PROTEINUR, UROBILINOGEN, NITRITE, LEUKOCYTESUR   STUDIES: No results found.   ELIGIBLE FOR AVAILABLE RESEARCH PROTOCOL: Not eligible for PALLAS because HER-2 positive  ASSESSMENT: 75 y.o. [place] woman status post left breast upper outer quadrant and left axillary lymph node biopsy 08/13/2015 both positive for an invasive ductal carcinoma, grade 2 or 3, triple positive, with an MIB-1 of 50%  (1) neoadjuvant chemotherapy consisting of carboplatin, docetaxel, trastuzumab and pertuzumab given every 21 days Started 09/11/2015  (a) treatment changed to Abraxane and trastuzumab with cycle 2 because of side effects after cycle 1  (b) Abraxane discontinued after 3d dose (10/17/2015) because of peripheral neuropathy  (c) cyclophosphamide, methotrexate and fluorouracil (CMF) started 11/07/2015, 4 cycles, last dose 01/09/2016   (2) trastuzumab continued to complete a year (last dose September 24, 2016)  (a) final echocardiogram 07/23/2016 showed a well-preserved ejection fraction at 55-60%  (3) status post left lumpectomy with targeted axillary dissection 02/14/2016 showing a residual pT1c pN1 invasive ductal carcinoma, grade 2, estrogen and progesterone receptor positive, now HER-2 not amplified; margins were negative  (a) left axillary seroma/abscess drained surgically  03/25/2016, but persists  (4) adjuvant radiation 05/19/16 - 06/29/16 Site/dose:    Left breast - 4 field             Left breast: 50 Gy in 25 fractions             SCLV/PAB: 45 Gy in 25 fractions Left breast boost: 10 GY in 5 fractions  (5) anastrozole started 07/07/2016  (a) bone density 06/24/2016 shows a T score of -0.7 (normal)  (b) density August 2021 shows a T score of -0.9 (normal)  (6) Anemia related to decreased iron saturation and b12 deficiency  (a) Feraheme to be given x 2 starting on 08/04/2019  (b) b12 injections weekly x4 (interrupted slightly due to travel plans), then monthly   PLAN: Kynli is now a little over 4 years out from definitive surgery for her breast cancer with no evidence of recurrence.  This is very favorable.  Recommend fosses climbing some.  The ALT and AST are normal.  I think this is going to be fatty liver but we really have to make sure this is not breast cancer so I am setting her up for an ultrasound of the liver and also a bone scan.  I will call her with those results.  She is very concerned about severe insomnia.  Possibly she might benefit from trazodone.  We discussed that at length today I have wrote that prescription for her  She will see me in December of this year.  Before that visit we will again check her iron and B12 levels but right now she has no anemia issues.  That may well be her graduation visit  Total encounter time 25 minutes.Sarajane Jews C. Voula Waln, MD 05/07/20 10:42  AM Medical Oncology and Hematology [facility] [place], [place] [address] Tel. [phone]    Fax. [phone]   I, Wilburn Mylar, am acting as scribe for Dr. Virgie Dad. .  I, Lurline Del MD, have reviewed the above documentation for accuracy and completeness, and I agree with the above.    *Total Encounter Time as defined by the Centers for Medicare and Medicaid Services includes, in addition to the  face-to-face time of a patient visit (documented in the note above) non-face-to-face time: obtaining and reviewing outside history, ordering and reviewing medications, tests or procedures, care coordination (communications with other health care professionals or caregivers) and documentation in the medical record.

## 2020-05-07 ENCOUNTER — Telehealth (HOSPITAL_COMMUNITY): Payer: Self-pay | Admitting: Adult Health

## 2020-05-07 ENCOUNTER — Inpatient Hospital Stay: Payer: HMO

## 2020-05-07 ENCOUNTER — Inpatient Hospital Stay: Payer: HMO | Attending: Oncology | Admitting: Oncology

## 2020-05-07 ENCOUNTER — Other Ambulatory Visit: Payer: Self-pay

## 2020-05-07 VITALS — BP 151/67 | HR 77 | Temp 97.3°F | Resp 18 | Ht 64.5 in | Wt 185.2 lb

## 2020-05-07 DIAGNOSIS — D508 Other iron deficiency anemias: Secondary | ICD-10-CM

## 2020-05-07 DIAGNOSIS — Z8249 Family history of ischemic heart disease and other diseases of the circulatory system: Secondary | ICD-10-CM | POA: Insufficient documentation

## 2020-05-07 DIAGNOSIS — C50212 Malignant neoplasm of upper-inner quadrant of left female breast: Secondary | ICD-10-CM | POA: Insufficient documentation

## 2020-05-07 DIAGNOSIS — E119 Type 2 diabetes mellitus without complications: Secondary | ICD-10-CM | POA: Diagnosis not present

## 2020-05-07 DIAGNOSIS — E538 Deficiency of other specified B group vitamins: Secondary | ICD-10-CM | POA: Diagnosis not present

## 2020-05-07 DIAGNOSIS — Z90722 Acquired absence of ovaries, bilateral: Secondary | ICD-10-CM | POA: Diagnosis not present

## 2020-05-07 DIAGNOSIS — Z6372 Alcoholism and drug addiction in family: Secondary | ICD-10-CM | POA: Diagnosis not present

## 2020-05-07 DIAGNOSIS — Z7982 Long term (current) use of aspirin: Secondary | ICD-10-CM | POA: Insufficient documentation

## 2020-05-07 DIAGNOSIS — Z79899 Other long term (current) drug therapy: Secondary | ICD-10-CM | POA: Insufficient documentation

## 2020-05-07 DIAGNOSIS — Z7984 Long term (current) use of oral hypoglycemic drugs: Secondary | ICD-10-CM | POA: Diagnosis not present

## 2020-05-07 DIAGNOSIS — Z923 Personal history of irradiation: Secondary | ICD-10-CM | POA: Insufficient documentation

## 2020-05-07 DIAGNOSIS — Z9079 Acquired absence of other genital organ(s): Secondary | ICD-10-CM | POA: Insufficient documentation

## 2020-05-07 DIAGNOSIS — Z833 Family history of diabetes mellitus: Secondary | ICD-10-CM | POA: Diagnosis not present

## 2020-05-07 DIAGNOSIS — Z7951 Long term (current) use of inhaled steroids: Secondary | ICD-10-CM | POA: Diagnosis not present

## 2020-05-07 DIAGNOSIS — C50412 Malignant neoplasm of upper-outer quadrant of left female breast: Secondary | ICD-10-CM | POA: Diagnosis not present

## 2020-05-07 DIAGNOSIS — Z170 Estrogen receptor positive status [ER+]: Secondary | ICD-10-CM

## 2020-05-07 DIAGNOSIS — Z79811 Long term (current) use of aromatase inhibitors: Secondary | ICD-10-CM | POA: Insufficient documentation

## 2020-05-07 DIAGNOSIS — I10 Essential (primary) hypertension: Secondary | ICD-10-CM | POA: Insufficient documentation

## 2020-05-07 DIAGNOSIS — Z90710 Acquired absence of both cervix and uterus: Secondary | ICD-10-CM | POA: Insufficient documentation

## 2020-05-07 LAB — CMP (CANCER CENTER ONLY)
ALT: 21 U/L (ref 0–44)
AST: 16 U/L (ref 15–41)
Albumin: 3.7 g/dL (ref 3.5–5.0)
Alkaline Phosphatase: 147 U/L — ABNORMAL HIGH (ref 38–126)
Anion gap: 10 (ref 5–15)
BUN: 10 mg/dL (ref 8–23)
CO2: 27 mmol/L (ref 22–32)
Calcium: 9.1 mg/dL (ref 8.9–10.3)
Chloride: 107 mmol/L (ref 98–111)
Creatinine: 0.79 mg/dL (ref 0.44–1.00)
GFR, Estimated: >60 mL/min (ref >60)
Glucose, Bld: 142 mg/dL — ABNORMAL HIGH (ref 70–99)
Potassium: 4.1 mmol/L (ref 3.5–5.1)
Sodium: 144 mmol/L (ref 135–145)
Total Bilirubin: 0.3 mg/dL (ref 0.3–1.2)
Total Protein: 6.8 g/dL (ref 6.5–8.1)

## 2020-05-07 LAB — CBC WITH DIFFERENTIAL (CANCER CENTER ONLY)
Abs Immature Granulocytes: 0.02 10*3/uL (ref 0.00–0.07)
Basophils Absolute: 0 10*3/uL (ref 0.0–0.1)
Basophils Relative: 1 %
Eosinophils Absolute: 0.1 10*3/uL (ref 0.0–0.5)
Eosinophils Relative: 3 %
HCT: 39.2 % (ref 36.0–46.0)
Hemoglobin: 12.6 g/dL (ref 12.0–15.0)
Immature Granulocytes: 0 %
Lymphocytes Relative: 19 %
Lymphs Abs: 1 10*3/uL (ref 0.7–4.0)
MCH: 31 pg (ref 26.0–34.0)
MCHC: 32.1 g/dL (ref 30.0–36.0)
MCV: 96.3 fL (ref 80.0–100.0)
Monocytes Absolute: 0.4 10*3/uL (ref 0.1–1.0)
Monocytes Relative: 7 %
Neutro Abs: 3.8 10*3/uL (ref 1.7–7.7)
Neutrophils Relative %: 70 %
Platelet Count: 189 10*3/uL (ref 150–400)
RBC: 4.07 MIL/uL (ref 3.87–5.11)
RDW: 13 % (ref 11.5–15.5)
WBC Count: 5.4 10*3/uL (ref 4.0–10.5)
nRBC: 0 % (ref 0.0–0.2)

## 2020-05-07 LAB — IRON AND TIBC
Iron: 65 ug/dL (ref 41–142)
Saturation Ratios: 24 % (ref 21–57)
TIBC: 265 ug/dL (ref 236–444)
UIBC: 200 ug/dL (ref 120–384)

## 2020-05-07 LAB — FERRITIN: Ferritin: 170 ng/mL (ref 11–307)

## 2020-05-07 LAB — VITAMIN B12: Vitamin B-12: 547 pg/mL (ref 180–914)

## 2020-05-07 MED ORDER — ANASTROZOLE 1 MG PO TABS: 1.0000 mg | ORAL_TABLET | Freq: Every day | ORAL | 4 refills | Status: DC

## 2020-05-07 MED ORDER — TRAZODONE HCL 50 MG PO TABS: 50.0000 mg | ORAL_TABLET | Freq: Every day | ORAL | 4 refills | Status: DC

## 2020-05-07 NOTE — Telephone Encounter (Signed)
error 

## 2020-05-09 DIAGNOSIS — H524 Presbyopia: Secondary | ICD-10-CM | POA: Diagnosis not present

## 2020-05-09 DIAGNOSIS — H18513 Endothelial corneal dystrophy, bilateral: Secondary | ICD-10-CM | POA: Diagnosis not present

## 2020-05-09 DIAGNOSIS — H353131 Nonexudative age-related macular degeneration, bilateral, early dry stage: Secondary | ICD-10-CM | POA: Diagnosis not present

## 2020-05-09 DIAGNOSIS — E119 Type 2 diabetes mellitus without complications: Secondary | ICD-10-CM | POA: Diagnosis not present

## 2020-05-09 DIAGNOSIS — Z961 Presence of intraocular lens: Secondary | ICD-10-CM | POA: Diagnosis not present

## 2020-05-22 ENCOUNTER — Encounter (HOSPITAL_COMMUNITY)
Admission: RE | Admit: 2020-05-22 | Discharge: 2020-05-22 | Disposition: A | Discharge status: Home / Self Care | Payer: HMO | Source: Ambulatory Visit | Attending: Oncology | Admitting: Oncology

## 2020-05-22 ENCOUNTER — Ambulatory Visit (HOSPITAL_COMMUNITY)
Admission: RE | Admit: 2020-05-22 | Discharge: 2020-05-22 | Disposition: A | Discharge status: Home / Self Care | Payer: HMO | Source: Ambulatory Visit | Attending: Oncology | Admitting: Oncology

## 2020-05-22 ENCOUNTER — Other Ambulatory Visit: Payer: Self-pay

## 2020-05-22 DIAGNOSIS — Z170 Estrogen receptor positive status [ER+]: Secondary | ICD-10-CM | POA: Insufficient documentation

## 2020-05-22 DIAGNOSIS — E119 Type 2 diabetes mellitus without complications: Secondary | ICD-10-CM | POA: Diagnosis not present

## 2020-05-22 DIAGNOSIS — M79621 Pain in right upper arm: Secondary | ICD-10-CM | POA: Diagnosis not present

## 2020-05-22 DIAGNOSIS — C50412 Malignant neoplasm of upper-outer quadrant of left female breast: Secondary | ICD-10-CM | POA: Insufficient documentation

## 2020-05-22 DIAGNOSIS — C50919 Malignant neoplasm of unspecified site of unspecified female breast: Secondary | ICD-10-CM | POA: Diagnosis not present

## 2020-05-22 DIAGNOSIS — R7989 Other specified abnormal findings of blood chemistry: Secondary | ICD-10-CM | POA: Diagnosis not present

## 2020-05-22 DIAGNOSIS — M79622 Pain in left upper arm: Secondary | ICD-10-CM | POA: Diagnosis not present

## 2020-05-22 MED ORDER — TECHNETIUM TC 99M MEDRONATE IV KIT
20.3000 | PACK | Freq: Once | INTRAVENOUS | Status: AC | PRN
  Administered 2020-05-22: 20.3 via INTRAVENOUS

## 2020-05-23 ENCOUNTER — Encounter: Payer: Self-pay | Admitting: Oncology

## 2020-05-24 ENCOUNTER — Encounter: Payer: Self-pay | Admitting: Oncology

## 2020-06-06 ENCOUNTER — Other Ambulatory Visit: Payer: Self-pay | Admitting: Oncology

## 2020-06-06 DIAGNOSIS — N3021 Other chronic cystitis with hematuria: Secondary | ICD-10-CM | POA: Diagnosis not present

## 2020-06-06 DIAGNOSIS — N952 Postmenopausal atrophic vaginitis: Secondary | ICD-10-CM | POA: Diagnosis not present

## 2020-06-13 ENCOUNTER — Other Ambulatory Visit: Payer: Self-pay | Admitting: Nurse Practitioner

## 2020-06-13 DIAGNOSIS — D509 Iron deficiency anemia, unspecified: Secondary | ICD-10-CM | POA: Diagnosis not present

## 2020-06-13 DIAGNOSIS — E11610 Type 2 diabetes mellitus with diabetic neuropathic arthropathy: Secondary | ICD-10-CM | POA: Diagnosis not present

## 2020-06-13 DIAGNOSIS — I10 Essential (primary) hypertension: Secondary | ICD-10-CM | POA: Diagnosis not present

## 2020-06-13 DIAGNOSIS — E7800 Pure hypercholesterolemia, unspecified: Secondary | ICD-10-CM | POA: Diagnosis not present

## 2020-06-13 DIAGNOSIS — E1149 Type 2 diabetes mellitus with other diabetic neurological complication: Secondary | ICD-10-CM | POA: Diagnosis not present

## 2020-06-13 DIAGNOSIS — C50919 Malignant neoplasm of unspecified site of unspecified female breast: Secondary | ICD-10-CM | POA: Diagnosis not present

## 2020-06-14 DIAGNOSIS — C50919 Malignant neoplasm of unspecified site of unspecified female breast: Secondary | ICD-10-CM | POA: Diagnosis not present

## 2020-06-14 DIAGNOSIS — Z23 Encounter for immunization: Secondary | ICD-10-CM | POA: Diagnosis not present

## 2020-06-14 DIAGNOSIS — E1149 Type 2 diabetes mellitus with other diabetic neurological complication: Secondary | ICD-10-CM | POA: Diagnosis not present

## 2020-06-14 DIAGNOSIS — I10 Essential (primary) hypertension: Secondary | ICD-10-CM | POA: Diagnosis not present

## 2020-06-14 DIAGNOSIS — Z0000 Encounter for general adult medical examination without abnormal findings: Secondary | ICD-10-CM | POA: Diagnosis not present

## 2020-06-14 DIAGNOSIS — Z7984 Long term (current) use of oral hypoglycemic drugs: Secondary | ICD-10-CM | POA: Diagnosis not present

## 2020-06-14 DIAGNOSIS — G629 Polyneuropathy, unspecified: Secondary | ICD-10-CM | POA: Diagnosis not present

## 2020-06-14 DIAGNOSIS — K279 Peptic ulcer, site unspecified, unspecified as acute or chronic, without hemorrhage or perforation: Secondary | ICD-10-CM | POA: Diagnosis not present

## 2020-06-14 DIAGNOSIS — E11610 Type 2 diabetes mellitus with diabetic neuropathic arthropathy: Secondary | ICD-10-CM | POA: Diagnosis not present

## 2020-06-14 DIAGNOSIS — E7800 Pure hypercholesterolemia, unspecified: Secondary | ICD-10-CM | POA: Diagnosis not present

## 2020-07-31 DIAGNOSIS — E7800 Pure hypercholesterolemia, unspecified: Secondary | ICD-10-CM | POA: Diagnosis not present

## 2020-07-31 DIAGNOSIS — E11610 Type 2 diabetes mellitus with diabetic neuropathic arthropathy: Secondary | ICD-10-CM | POA: Diagnosis not present

## 2020-07-31 DIAGNOSIS — D509 Iron deficiency anemia, unspecified: Secondary | ICD-10-CM | POA: Diagnosis not present

## 2020-07-31 DIAGNOSIS — I10 Essential (primary) hypertension: Secondary | ICD-10-CM | POA: Diagnosis not present

## 2020-07-31 DIAGNOSIS — C50919 Malignant neoplasm of unspecified site of unspecified female breast: Secondary | ICD-10-CM | POA: Diagnosis not present

## 2020-07-31 DIAGNOSIS — E1149 Type 2 diabetes mellitus with other diabetic neurological complication: Secondary | ICD-10-CM | POA: Diagnosis not present

## 2020-08-15 DIAGNOSIS — R300 Dysuria: Secondary | ICD-10-CM | POA: Diagnosis not present

## 2020-08-15 DIAGNOSIS — N3021 Other chronic cystitis with hematuria: Secondary | ICD-10-CM | POA: Diagnosis not present

## 2020-08-15 DIAGNOSIS — N952 Postmenopausal atrophic vaginitis: Secondary | ICD-10-CM | POA: Diagnosis not present

## 2020-08-19 ENCOUNTER — Encounter: Payer: Self-pay | Admitting: Oncology

## 2020-08-20 ENCOUNTER — Other Ambulatory Visit: Payer: Self-pay | Admitting: Oncology

## 2020-08-22 ENCOUNTER — Telehealth: Payer: Self-pay | Admitting: *Deleted

## 2020-08-22 MED ORDER — TAMOXIFEN CITRATE 20 MG PO TABS: 20.0000 mg | ORAL_TABLET | Freq: Every day | ORAL | 3 refills | Status: DC

## 2020-08-22 NOTE — Telephone Encounter (Signed)
Per MD communication with Dr Lovena Neighbours - this RN contacted pt and informed her to stop the arimidex/anastrozole at this time.  Prescription for tamoxifen will be sent to her pharmacy.  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  Pt verbalized understanding of above plan.  She states she believes Dr Lovena Neighbours will be prescribing the estrogen cream.  Pharmacy verified - and prescription sent.

## 2020-10-04 DIAGNOSIS — E1149 Type 2 diabetes mellitus with other diabetic neurological complication: Secondary | ICD-10-CM | POA: Diagnosis not present

## 2020-10-04 DIAGNOSIS — I10 Essential (primary) hypertension: Secondary | ICD-10-CM | POA: Diagnosis not present

## 2020-10-04 DIAGNOSIS — D509 Iron deficiency anemia, unspecified: Secondary | ICD-10-CM | POA: Diagnosis not present

## 2020-10-04 DIAGNOSIS — E7800 Pure hypercholesterolemia, unspecified: Secondary | ICD-10-CM | POA: Diagnosis not present

## 2020-10-04 DIAGNOSIS — C50919 Malignant neoplasm of unspecified site of unspecified female breast: Secondary | ICD-10-CM | POA: Diagnosis not present

## 2020-10-04 DIAGNOSIS — E11610 Type 2 diabetes mellitus with diabetic neuropathic arthropathy: Secondary | ICD-10-CM | POA: Diagnosis not present

## 2020-10-15 ENCOUNTER — Other Ambulatory Visit: Payer: Self-pay | Admitting: Family Medicine

## 2020-10-15 DIAGNOSIS — Z09 Encounter for follow-up examination after completed treatment for conditions other than malignant neoplasm: Secondary | ICD-10-CM

## 2020-10-18 DIAGNOSIS — M67962 Unspecified disorder of synovium and tendon, left lower leg: Secondary | ICD-10-CM | POA: Diagnosis not present

## 2020-10-18 DIAGNOSIS — M14671 Charcot's joint, right ankle and foot: Secondary | ICD-10-CM | POA: Diagnosis not present

## 2020-10-18 DIAGNOSIS — M25572 Pain in left ankle and joints of left foot: Secondary | ICD-10-CM | POA: Diagnosis not present

## 2020-10-18 DIAGNOSIS — M545 Low back pain, unspecified: Secondary | ICD-10-CM | POA: Diagnosis not present

## 2020-10-24 DIAGNOSIS — M25572 Pain in left ankle and joints of left foot: Secondary | ICD-10-CM | POA: Diagnosis not present

## 2020-10-31 ENCOUNTER — Other Ambulatory Visit: Payer: Self-pay | Admitting: Oncology

## 2020-10-31 ENCOUNTER — Other Ambulatory Visit: Payer: Self-pay | Admitting: Family Medicine

## 2020-10-31 DIAGNOSIS — N6489 Other specified disorders of breast: Secondary | ICD-10-CM

## 2020-10-31 DIAGNOSIS — M25572 Pain in left ankle and joints of left foot: Secondary | ICD-10-CM | POA: Diagnosis not present

## 2020-11-04 DIAGNOSIS — Z23 Encounter for immunization: Secondary | ICD-10-CM | POA: Diagnosis not present

## 2020-11-04 DIAGNOSIS — R600 Localized edema: Secondary | ICD-10-CM | POA: Diagnosis not present

## 2020-11-05 ENCOUNTER — Ambulatory Visit
Admission: RE | Admit: 2020-11-05 | Discharge: 2020-11-05 | Disposition: A | Discharge status: Home / Self Care | Payer: HMO | Source: Ambulatory Visit | Attending: Family Medicine | Admitting: Family Medicine

## 2020-11-05 ENCOUNTER — Other Ambulatory Visit: Payer: Self-pay

## 2020-11-05 DIAGNOSIS — N6489 Other specified disorders of breast: Secondary | ICD-10-CM

## 2020-11-05 DIAGNOSIS — R922 Inconclusive mammogram: Secondary | ICD-10-CM | POA: Diagnosis not present

## 2020-11-08 DIAGNOSIS — R7989 Other specified abnormal findings of blood chemistry: Secondary | ICD-10-CM | POA: Diagnosis not present

## 2020-11-08 DIAGNOSIS — M25572 Pain in left ankle and joints of left foot: Secondary | ICD-10-CM | POA: Diagnosis not present

## 2020-11-13 DIAGNOSIS — I8310 Varicose veins of unspecified lower extremity with inflammation: Secondary | ICD-10-CM | POA: Diagnosis not present

## 2020-11-13 DIAGNOSIS — M79672 Pain in left foot: Secondary | ICD-10-CM | POA: Diagnosis not present

## 2020-11-14 ENCOUNTER — Encounter: Payer: Self-pay | Admitting: Oncology

## 2020-11-14 DIAGNOSIS — N952 Postmenopausal atrophic vaginitis: Secondary | ICD-10-CM | POA: Diagnosis not present

## 2020-11-14 DIAGNOSIS — N3021 Other chronic cystitis with hematuria: Secondary | ICD-10-CM | POA: Diagnosis not present

## 2020-11-15 ENCOUNTER — Inpatient Hospital Stay: Payer: HMO | Attending: Adult Health | Admitting: Adult Health

## 2020-11-15 ENCOUNTER — Other Ambulatory Visit: Payer: Self-pay

## 2020-11-15 ENCOUNTER — Encounter: Payer: Self-pay | Admitting: Adult Health

## 2020-11-15 VITALS — BP 133/74 | HR 81 | Temp 97.2°F | Resp 18 | Ht 64.5 in | Wt 175.9 lb

## 2020-11-15 DIAGNOSIS — C773 Secondary and unspecified malignant neoplasm of axilla and upper limb lymph nodes: Secondary | ICD-10-CM | POA: Diagnosis not present

## 2020-11-15 DIAGNOSIS — Z79810 Long term (current) use of selective estrogen receptor modulators (SERMs): Secondary | ICD-10-CM | POA: Insufficient documentation

## 2020-11-15 DIAGNOSIS — D509 Iron deficiency anemia, unspecified: Secondary | ICD-10-CM | POA: Insufficient documentation

## 2020-11-15 DIAGNOSIS — E538 Deficiency of other specified B group vitamins: Secondary | ICD-10-CM | POA: Diagnosis not present

## 2020-11-15 DIAGNOSIS — M7989 Other specified soft tissue disorders: Secondary | ICD-10-CM | POA: Diagnosis not present

## 2020-11-15 DIAGNOSIS — C50412 Malignant neoplasm of upper-outer quadrant of left female breast: Secondary | ICD-10-CM | POA: Diagnosis not present

## 2020-11-15 DIAGNOSIS — Z170 Estrogen receptor positive status [ER+]: Secondary | ICD-10-CM | POA: Diagnosis not present

## 2020-11-15 DIAGNOSIS — C50912 Malignant neoplasm of unspecified site of left female breast: Secondary | ICD-10-CM | POA: Diagnosis not present

## 2020-11-15 NOTE — Progress Notes (Signed)
Right leg [id number]

## 2020-11-15 NOTE — Progress Notes (Signed)
[facility]  Telephone:([phone]) [phone] Fax:(336) [phone]     ID: Kristine Parrish DOB: 05/25/1945  MR#: [id number]  WNU#:[id number]  Patient Care Team: Orpah Melter, MD as PCP - General (Family Medicine) Jovita Kussmaul, MD as Consulting Physician (General Surgery) Magrinat, Virgie Dad, MD as Consulting Physician (Oncology) Eppie Gibson, MD as Attending Physician (Radiation Oncology) Murvin Donning, MD (Dermatology) Laurence Spates, MD (Inactive) as Consulting Physician (Gastroenterology) Rosemary Holms, DPM as Consulting Physician (Podiatry) Maisie Fus, MD as Consulting Physician (Obstetrics and Gynecology) Larey Dresser, MD as Consulting Physician (Cardiology) Wylene Simmer, MD as Consulting Physician (Orthopedic Surgery) Laurin Coder, MD as Consulting Physician (Pulmonary Disease) OTHER MD:  CHIEF COMPLAINT: Estrogen receptor positive breast cancer/iron and b12 deficiency  CURRENT TREATMENT: Tamoxifen   INTERVAL HISTORY: Kristine Parrish returns today for urgent evaluation of her right leg concern.  She started Tamoxifen about three months ago due to recurrent UTI issues she had with anastrozole.  She had been tolerating it well, however over the past week she has developed an increase in swelling in her right calf.  She has some erythema in the calf, however it has remained the same since this swelling began.  She underwent doppler with her primary care at [facility] on 11/08/2020 and it was negative for DVT.   She took a 5 day course of Septra, however the erythema remains, and is not improved.  The erythema has also not worsened.  She denies any open sores on her foot.  She notes her right foot is chronically numb.     REVIEW OF SYSTEMS: Review of Systems  Constitutional:  Negative for appetite change, chills, fatigue, fever and unexpected weight change.  HENT:   Negative for hearing loss, lump/mass and trouble swallowing.   Eyes:  Negative for eye problems and  icterus.  Respiratory:  Negative for chest tightness, cough and shortness of breath.   Cardiovascular:  Positive for leg swelling. Negative for chest pain and palpitations.  Gastrointestinal:  Negative for abdominal distention, abdominal pain, constipation, diarrhea, nausea and vomiting.  Endocrine: Negative for hot flashes.  Genitourinary:  Negative for difficulty urinating.   Musculoskeletal:  Negative for arthralgias.  Skin:  Negative for itching and rash.  Neurological:  Negative for dizziness, extremity weakness, headaches and numbness.  Hematological:  Negative for adenopathy. Does not bruise/bleed easily.  Psychiatric/Behavioral:  Negative for depression. The patient is not nervous/anxious.      COVID 19 VACCINATION STATUS: Status Parrish dozing x2  booster   BREAST CANCER HISTORY: From the original intake note  Kristine Parrish herself noted a change in her left breast and brought it to the attention of Dr. Nori Riis, who set her up for bilateral diagnostic mammography with tomography and left breast ultrasonography at the [facility] 08/09/2015. The breast density was category D. In the area of palpable concern there was a mass with irregular margins and architectural distortion, measuring approximately 3 cm. This was palpable in the upper outer quadrant near the nipple. The ultrasound confirmed an irregular hypoechoic mass approximately 3 cm from the nipple at the 10:00 position measuring 2.7 cm. Ultrasound of the left axilla found a single level I left axillary node with focal cortical thickening.  On 08/13/2015 the patient underwent biopsy of the left breast mass and the suspicious left axillary lymph node, both showing invasive ductal carcinoma, grade 2 or 3, estrogen receptor 100% positive, progesterone receptor 100% positive, both with strong staining intensity, with an MIB-1 of 50%, and HER-2  amplification, the signals ratio being 2.2 to and the number per cell 3.55  The patient's subsequent  history is as detailed below   PAST MEDICAL HISTORY: Past Medical History:  Diagnosis Date   Arthritis    "hands "   Breast cancer of upper-outer quadrant of left female breast ([place]) 08/15/2015   Colon polyps    Dyspnea    with exertion- "from chemo"   GERD (gastroesophageal reflux disease)    Hiatal hernia    History of bronchitis    History of radiation therapy 05/19/16- 06/29/16   Left Breast 50 Gy in 25 fractions, SCLV/PAB 45 Gy in 25 fractions, Left Breast boost 10 Gy in 5 fractions.    Hypertension    Neuropathy    from chemopathy   Peptic ulcer disease    Peripheral neuropathy    Personal history of chemotherapy    Personal history of radiation therapy    Type 2 diabetes mellitus ([place])    Type II    PAST SURGICAL HISTORY: Past Surgical History:  Procedure Laterality Date   APPENDECTOMY  1974   BREAST LUMPECTOMY Left 02/14/2016   cataract surgery Bilateral 04/2012   CHOLECYSTECTOMY  1996   COLONOSCOPY     FOOT SURGERY     left bunion removed   nerve removed from left foot  2000   PORTACATH PLACEMENT N/A 09/05/2015   Procedure: INSERTION PORT-A-CATH;  Surgeon: Autumn Messing III, MD;  Location: [facility];  Service: General;  Laterality: N/A;   RADIOACTIVE SEED GUIDED PARTIAL MASTECTOMY/AXILLARY SENTINEL NODE BIOPSY/AXILLARY NODE DISSECTION Left 02/14/2016   Procedure: LEFT BREAST RADIOACTIVE SEED GUIDED LUMPECTOMY WITH LEFT  SENTINEL LYMPH NODE BIOPSY AND LEFT  SEED TARGETED DISSECTION;  Surgeon: Autumn Messing III, MD;  Location: [facility] OR;  Service: General;  Laterality: Left;   TUBAL LIGATION     VAGINAL HYSTERECTOMY  1995    FAMILY HISTORY Family History  Problem Relation Age of Onset   Diabetes Mother    Hypertension Mother    Alcoholism Maternal Grandfather    Congestive Heart Failure Maternal Grandmother   The patient has little information about her father and is not sure of his cause of death or age at death. The patient's mother died at age 2. The patient had no  siblings. She was raised by her grandmother. She is not aware of any breast or ovarian cancer history in the family    GYNECOLOGIC HISTORY:  No LMP recorded. Patient has had a hysterectomy.  menarche age 50, first live birth age 66. The patient is GX P2. She underwent hysterectomy with bilateral salpingo-oophorectomy in 1994, and has been on estrogen replacement since that time, discontinued June 2017.    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  She is divorced and currently lives next door to her daughter, Kristine Parrish, who lives in [place] and works as a Teacher, music for the [employer]. Son, Kristine Parrish lives in [place] where he is Secondary school teacher. The patient has one granddaughter, 44 years old as of August 2019.  The patient attends [employer]. The patient's daughter Kristine Parrish is her healthcare power of attorney.    HEALTH MAINTENANCE: Social History   Tobacco Use   Smoking status: Never   Smokeless tobacco: Never  Substance Use Topics   Alcohol use: No   Drug use: No  Colonoscopy: 2012/[facility]  PAP:  Bone density:  06/24/2016 shows a T score of -0.7 (normal)  Lipid panel:  No Active Allergies  Current Outpatient Medications  Medication Sig Dispense Refill   albuterol (VENTOLIN HFA) 108 (90 Base) MCG/ACT inhaler Inhale 2 puffs into the lungs every 6 (six) hours as needed for wheezing or shortness of breath. 18 g 6   aspirin 81 MG tablet Take 81 mg by mouth daily.     BREO ELLIPTA 100-25 MCG/INH AEPB INHALE 1 PUFF BY MOUTH EVERY DAY 60 each 12   gabapentin (NEURONTIN) 800 MG tablet Take 800 mg by mouth 3 (three) times daily.     glipiZIDE (GLUCOTROL) 10 MG tablet Take 10 mg by mouth daily before breakfast.     losartan (COZAAR) 25 MG tablet Take 1 tablet (25 mg total) by mouth daily. 30 tablet 6   lovastatin (MEVACOR) 20 MG  tablet Take 20 mg by mouth at bedtime.     metFORMIN (GLUCOPHAGE) 500 MG tablet Take 500 mg by mouth 2 (two) times daily with a meal.      Multiple Vitamin (MULTIVITAMIN) tablet Take 1 tablet by mouth daily.     Omega-3 Fatty Acids (FISH OIL PO) Take 2 tablets by mouth. 2445m a day     omeprazole (PRILOSEC) 20 MG capsule Take 20 mg by mouth daily.     pioglitazone (ACTOS) 15 MG tablet Take 15 mg by mouth daily.     Probiotic Product (PROBIOTIC DAILY PO) Take 1 tablet by mouth daily. Reported on 08/21/2015     tamoxifen (NOLVADEX) 20 MG tablet Take 1 tablet (20 mg total) by mouth daily. 30 tablet 3   traZODone (DESYREL) 50 MG tablet Take 1 tablet (50 mg total) by mouth at bedtime. 90 tablet 4   No current facility-administered medications for this visit.    OBJECTIVE: White woman who appears stated age V37   11/15/20 1155  BP: 133/74  Pulse: 81  Resp: 18  Temp: (!) 97.2 F (36.2 C)  SpO2: 98%   Wt Readings from Last 3 Encounters:  11/15/20 175 lb 14.4 oz (79.8 kg)  05/07/20 185 lb 3.2 oz (84 kg)  12/25/19 182 lb 12.8 oz (82.9 kg)   Body mass index is 29.73 kg/m.    ECOG FS:2 - Symptomatic, <50% confined to bed  GENERAL: Patient is a well appearing female in no acute distress HEENT:  Sclerae anicteric.  Oropharynx clear and moist. No ulcerations or evidence of oropharyngeal candidiasis. Neck is supple.  NODES:  No cervical, supraclavicular, or axillary lymphadenopathy palpated.  BREAST EXAM:  Deferred. LUNGS:  Clear to auscultation bilaterally.  No wheezes or rhonchi. HEART:  Regular rate and rhythm. No murmur appreciated. ABDOMEN:  Soft, nontender.  Positive, normoactive bowel sounds. No organomegaly palpated. MSK:  No focal spinal tenderness to palpation. Full range of motion bilaterally in the upper extremities. EXTREMITIES:  right leg with +calf edema, non pitting, and erythema between ankle and knee, see picture below, this erythema is blanching, her foot was  thoroughly examined, no open areas or sores, she has no erythema on her foot, and 2+ PT pulse was palpated.   SKIN:  Clear with no obvious rashes or skin changes. No nail dyscrasia. NEURO:  Nonfocal. Well oriented.  Appropriate affect. Right leg on 11/15/2020    LAB RESULTS:  CMP     Component Value Date/Time   NA 144 05/07/2020 0952   NA 140 12/28/2016 1043   K 4.1 05/07/2020 0952  K 4.1 12/28/2016 1043   CL 107 05/07/2020 0952   CO2 27 05/07/2020 0952   CO2 26 12/28/2016 1043   GLUCOSE 142 (H) 05/07/2020 0952   GLUCOSE 173 (H) 12/28/2016 1043   BUN 10 05/07/2020 0952   BUN 16.9 12/28/2016 1043   CREATININE 0.79 05/07/2020 0952   CREATININE 0.9 12/28/2016 1043   CALCIUM 9.1 05/07/2020 0952   CALCIUM 9.3 12/28/2016 1043   PROT 6.8 05/07/2020 0952   PROT 7.0 12/28/2016 1043   ALBUMIN 3.7 05/07/2020 0952   ALBUMIN 3.6 12/28/2016 1043   AST 16 05/07/2020 0952   AST 17 12/28/2016 1043   ALT 21 05/07/2020 0952   ALT 17 12/28/2016 1043   ALKPHOS 147 (H) 05/07/2020 0952   ALKPHOS 158 (H) 12/28/2016 1043   BILITOT 0.3 05/07/2020 0952   BILITOT 0.36 12/28/2016 1043   GFRNONAA >60 05/07/2020 0952   GFRAA >60 09/21/2019 0931    INo results found for: SPEP, UPEP  Lab Results  Component Value Date   WBC 5.4 05/07/2020   NEUTROABS 3.8 05/07/2020   HGB 12.6 05/07/2020   HCT 39.2 05/07/2020   MCV 96.3 05/07/2020   PLT 189 05/07/2020      Chemistry      Component Value Date/Time   NA 144 05/07/2020 0952   NA 140 12/28/2016 1043   K 4.1 05/07/2020 0952   K 4.1 12/28/2016 1043   CL 107 05/07/2020 0952   CO2 27 05/07/2020 0952   CO2 26 12/28/2016 1043   BUN 10 05/07/2020 0952   BUN 16.9 12/28/2016 1043   CREATININE 0.79 05/07/2020 0952   CREATININE 0.9 12/28/2016 1043      Component Value Date/Time   CALCIUM 9.1 05/07/2020 0952   CALCIUM 9.3 12/28/2016 1043   ALKPHOS 147 (H) 05/07/2020 0952   ALKPHOS 158 (H) 12/28/2016 1043   AST 16 05/07/2020 0952   AST 17  12/28/2016 1043   ALT 21 05/07/2020 0952   ALT 17 12/28/2016 1043   BILITOT 0.3 05/07/2020 0952   BILITOT 0.36 12/28/2016 1043     No results found for: LABCA2  No components found for: LABCA125  No results for input(s): INR in the last 168 hours.  Urinalysis No results found for: COLORURINE, APPEARANCEUR, LABSPEC, PHURINE, GLUCOSEU, HGBUR, BILIRUBINUR, KETONESUR, PROTEINUR, UROBILINOGEN, NITRITE, LEUKOCYTESUR   STUDIES: MM DIAG BREAST TOMO BILATERAL  Result Date: 11/05/2020 CLINICAL DATA:  75 year old female presenting for routine annual surveillance status Parrish left breast lumpectomy in 2017. EXAM: DIGITAL DIAGNOSTIC BILATERAL MAMMOGRAM WITH TOMOSYNTHESIS AND CAD TECHNIQUE: Bilateral digital diagnostic mammography and breast tomosynthesis was performed. The images were evaluated with computer-aided detection. COMPARISON:  Previous exam(s). ACR Breast Density Category c: The breast tissue is heterogeneously dense, which may obscure small masses. FINDINGS: The surgical changes in the left breast and left axilla are stable. There is a stable seroma in the left axilla. No suspicious calcifications, masses or areas of distortion are seen in the bilateral breasts. IMPRESSION: 1. The left breast lumpectomy site and left axillary seroma are stable. No evidence of malignancy in the bilateral breasts. RECOMMENDATION: Per protocol, as the patient is now 2 or more years status Parrish lumpectomy, she may return to annual screening mammography in 1 year. However, given the history of breast cancer, the patient remains eligible for annual diagnostic mammography if preferred. I have discussed the findings and recommendations with the patient. If applicable, a reminder letter will be sent to the patient regarding the next appointment. BI-RADS CATEGORY  2: Benign. Electronically Signed   By: Ammie Ferrier M.D.   On: 11/05/2020 13:58    ELIGIBLE FOR AVAILABLE RESEARCH PROTOCOL: Not eligible for PALLAS  because HER-2 positive  ASSESSMENT: 75 y.o. [place] woman status Parrish left breast upper outer quadrant and left axillary lymph node biopsy 08/13/2015 both positive for an invasive ductal carcinoma, grade 2 or 3, triple positive, with an MIB-1 of 50%  (1) neoadjuvant chemotherapy consisting of carboplatin, docetaxel, trastuzumab and pertuzumab given every 21 days Started 09/11/2015  (a) treatment changed to Abraxane and trastuzumab with cycle 2 because of side effects after cycle 1  (b) Abraxane discontinued after 3d dose (10/17/2015) because of peripheral neuropathy  (c) cyclophosphamide, methotrexate and fluorouracil (CMF) started 11/07/2015, 4 cycles, last dose 01/09/2016   (2) trastuzumab continued to complete a year (last dose September 24, 2016)  (a) final echocardiogram 07/23/2016 showed a well-preserved ejection fraction at 55-60%  (3) status Parrish left lumpectomy with targeted axillary dissection 02/14/2016 showing a residual pT1c pN1 invasive ductal carcinoma, grade 2, estrogen and progesterone receptor positive, now HER-2 not amplified; margins were negative  (a) left axillary seroma/abscess drained surgically 03/25/2016, but persists  (4) adjuvant radiation 05/19/16 - 06/29/16  Site/dose:    Left breast - 4 field             Left breast: 50 Gy in 25 fractions             SCLV/PAB: 45 Gy in 25 fractions Left breast boost: 10 GY in 5 fractions  (5) anastrozole started 07/07/2016  (a) bone density 06/24/2016 shows a T score of -0.7 (normal)  (b) density August 2021 shows a T score of -0.9 (normal)  (6) Anemia related to decreased iron saturation and b12 deficiency  (a) Feraheme to be given x 2 starting on 08/04/2019  (b) b12 injections weekly x4 (interrupted slightly due to travel plans), then monthly   PLAN: Kristine Parrish is doing well today.  After examining and taking a photo of her leg I sent a message to Dr. Trula Slade to evaluate, as this very well could be a vascular issue.    Kristine Parrish  will see him in clinic next week.  There is no sign of DVT, so she can continue the Tamoxifen daily.  She likely will not benefit from continued antibiotics.  I placed a referral to Dr. Ilsa Iha, and requested she be seen early next week.    Kristine Parrish is in agreement with the plan.  She knows to call for any questions that may arise between now and her next appointment.  We are happy to see her sooner if needed.   Total encounter time 20 minutes.Wilber Bihari, NP 11/15/20 12:05 PM Medical Oncology and Hematology [facility] [place], [place] [address] Tel. [phone]    Fax. [phone]     *Total Encounter Time as defined by the Centers for Medicare and Medicaid Services includes, in addition to the face-to-face time of a patient visit (documented in the note above) non-face-to-face time: obtaining and reviewing outside history, ordering and reviewing medications, tests or procedures, care coordination (communications with other health care professionals or caregivers) and documentation in the medical record.

## 2020-11-20 ENCOUNTER — Ambulatory Visit: Payer: HMO | Admitting: Physician Assistant

## 2020-11-20 ENCOUNTER — Other Ambulatory Visit: Payer: Self-pay

## 2020-11-20 VITALS — BP 147/78 | HR 74 | Temp 97.7°F | Resp 14 | Ht 64.5 in | Wt 177.0 lb

## 2020-11-20 DIAGNOSIS — R600 Localized edema: Secondary | ICD-10-CM | POA: Diagnosis not present

## 2020-11-20 DIAGNOSIS — I872 Venous insufficiency (chronic) (peripheral): Secondary | ICD-10-CM

## 2020-11-20 NOTE — Progress Notes (Signed)
Requested by:  Orpah Melter, [place],  [place] [address]  Reason for consultation: right swollen leg X one month   History of Present Illness   Kristine Parrish is a 75 y.o. ([birth year]) female who presents for evaluation of of right lower extremity edema times one month. Complains of uncomfortable tightness and occasional itching and burning. No claudication or rest pain. Has been treated with antibiotics for redness without improvement.  She has a history of diabetes mellitus with right foot deformity and is status post right first toe/metatarsal bunionectomy.  No reports of poor healing.  She reports lower extremity neuropathy in a stocking distribution.  Venous symptoms include: positive if (X) [  ] aching [  ] heavy [  ] tired  [  ] throbbing [ x ] burning  [ x ] itching [ x ]swelling [  ] bleeding [  ] ulcer  Onset/duration:  at least one month  Occupation:  retired Aggravating factors: none Alleviating factors: none Compression:  no Helps:   Pain medications:  no Previous vein procedures:  no History of DVT:  no  Past Medical History:  Diagnosis Date   Arthritis    "hands "   Breast cancer of upper-outer quadrant of left female breast ([place]) 08/15/2015   Colon polyps    Dyspnea    with exertion- "from chemo"   GERD (gastroesophageal reflux disease)    Hiatal hernia    History of bronchitis    History of radiation therapy 05/19/16- 06/29/16   Left Breast 50 Gy in 25 fractions, SCLV/PAB 45 Gy in 25 fractions, Left Breast boost 10 Gy in 5 fractions.    Hypertension    Neuropathy    from chemopathy   Peptic ulcer disease    Peripheral neuropathy    Personal history of chemotherapy    Personal history of radiation therapy    Type 2 diabetes mellitus ([place])    Type II    Past Surgical History:  Procedure Laterality Date   APPENDECTOMY  1974   BREAST LUMPECTOMY Left 02/14/2016   cataract surgery Bilateral 04/2012   CHOLECYSTECTOMY  1996    COLONOSCOPY     FOOT SURGERY     left bunion removed   nerve removed from left foot  2000   PORTACATH PLACEMENT N/A 09/05/2015   Procedure: INSERTION PORT-A-CATH;  Surgeon: Autumn Messing III, MD;  Location: [facility];  Service: General;  Laterality: N/A;   RADIOACTIVE SEED GUIDED PARTIAL MASTECTOMY/AXILLARY SENTINEL NODE BIOPSY/AXILLARY NODE DISSECTION Left 02/14/2016   Procedure: LEFT BREAST RADIOACTIVE SEED GUIDED LUMPECTOMY WITH LEFT  SENTINEL LYMPH NODE BIOPSY AND LEFT  SEED TARGETED DISSECTION;  Surgeon: Autumn Messing III, MD;  Location: [facility];  Service: General;  Laterality: Left;   TUBAL LIGATION     VAGINAL HYSTERECTOMY  1995    Social History   Socioeconomic History   Marital status: Divorced    Spouse name: Not on file   Number of children: 2   Years of education: 12   Highest education level: Not on file  Occupational History   Not on file  Tobacco Use   Smoking status: Never   Smokeless tobacco: Never  Substance and Sexual Activity   Alcohol use: No   Drug use: No   Sexual activity: Not Currently  Other Topics Concern   Not on file  Social History Narrative   Patient is divorced.   Patient has two children  Patient works in a clerical position.   Patient has a high school education.   Patient drinks caffeine rarely.   Social Determinants of Health   Financial Resource Strain: Not on file  Food Insecurity: Not on file  Transportation Needs: Not on file  Physical Activity: Not on file  Stress: Not on file  Social Connections: Not on file  Intimate Partner Violence: Not on file    Family History  Problem Relation Age of Onset   Diabetes Mother    Hypertension Mother    Alcoholism Maternal Grandfather    Congestive Heart Failure Maternal Grandmother     Current Outpatient Medications  Medication Sig Dispense Refill   gabapentin (NEURONTIN) 800 MG tablet Take 800 mg by mouth 3 (three) times daily.     glipiZIDE (GLUCOTROL) 10 MG tablet Take 10 mg by mouth  daily before breakfast.     lovastatin (MEVACOR) 20 MG tablet Take 20 mg by mouth at bedtime.     metFORMIN (GLUCOPHAGE) 500 MG tablet Take 500 mg by mouth 2 (two) times daily with a meal.      Multiple Vitamin (MULTIVITAMIN) tablet Take 1 tablet by mouth daily.     omeprazole (PRILOSEC) 20 MG capsule Take 20 mg by mouth daily.     pioglitazone (ACTOS) 15 MG tablet Take 15 mg by mouth daily.     Probiotic Product (PROBIOTIC DAILY PO) Take 1 tablet by mouth daily. Reported on 08/21/2015     tamoxifen (NOLVADEX) 20 MG tablet Take 1 tablet (20 mg total) by mouth daily. 30 tablet 3   traZODone (DESYREL) 50 MG tablet Take 1 tablet (50 mg total) by mouth at bedtime. 90 tablet 4   losartan (COZAAR) 25 MG tablet Take 1 tablet (25 mg total) by mouth daily. 30 tablet 6   No current facility-administered medications for this visit.    No Active Allergies  REVIEW OF SYSTEMS (negative unless checked):   Cardiac:  []  Chest pain or chest pressure? []  Shortness of breath upon activity? []  Shortness of breath when lying flat? []  Irregular heart rhythm?  Vascular:  []  Pain in calf, thigh, or hip brought on by walking? []  Pain in feet at night that wakes you up from your sleep? []  Blood clot in your veins? [x]  Leg swelling?  Pulmonary:  []  Oxygen at home? []  Productive cough? []  Wheezing?  Neurologic:  []  Sudden weakness in arms or legs? []  Sudden numbness in arms or legs? []  Sudden onset of difficult speaking or slurred speech? []  Temporary loss of vision in one eye? []  Problems with dizziness?  Gastrointestinal:  []  Blood in stool? []  Vomited blood?  Genitourinary:  []  Burning when urinating? []  Blood in urine?  Psychiatric:  []  Major depression  Hematologic:  []  Bleeding problems? []  Problems with blood clotting?  Dermatologic:  []  Rashes or ulcers?  Constitutional:  []  Fever or chills?  Ear/Nose/Throat:  []  Change in hearing? []  Nose bleeds? []  Sore  throat?  Musculoskeletal:  []  Back pain? []  Joint pain? []  Muscle pain?   Physical Examination     Vitals:   11/20/20 1131  BP: (!) 147/78  Pulse: 74  Resp: 14  Temp: 97.7 F (36.5 C)  TempSrc: Temporal  SpO2: 96%  Weight: 177 lb (80.3 kg)  Height: 5' 4.5" (1.638 m)   Body mass index is 29.91 kg/m.  General:  WDWN in NAD; vital signs documented above Gait: Nunaided, no ataxia HENT: WNL, normocephalic Pulmonary: normal non-labored breathing  Cardiac: regular HR,  Abdomen: soft, NT, no masses Skin: with rashes distal right lower leg Vascular Exam/Pulses: 2+ dorsalis pedis, posterior tibial pulses bilaterally Extremities: without varicose veins, with reticular veins, with edema, with stasis pigmentation, with lipodermatosclerosis, without ulcers. Lower extremity edema and papular rash Musculoskeletal: no muscle wasting or atrophy  Neurologic: A&O X 3;  No focal weakness or paresthesias are detected Psychiatric:  The pt has Normal affect.     Non-invasive Vascular Imaging   RLE Venous Insufficiency Duplex  11/08/2020 Outside study results reviewed. No evidence of DVT. Small Bakers cyst.  Medical Decision Making   TERINA MCELHINNY is a 75 y.o. female who presents with: Chronic right lower extremity edema without evidence of DVT.  Skin changes consistent with venous insufficiency.  No evidence of arterial insufficiency.  Recommend venous reflux study and recommend compression stocking.  I have given her written information regarding elevating her leg above heart level in proper position.  She will need an open toe compression stocking as her neuropathy is such that she cannot wear closed toed shoes.  Follow-up in 2 to 3 weeks. (We stock closed toe knee high, open toe 20-30 mm Hg thigh high. Patient does not feel confident she will be able to don and doff stockings due to limited strength and mobility.)  Barbie Banner, PA-C [facility]  [place]: [phone]  11/20/2020, 11:40 AM  Clinic MD: Dr. Donzetta Matters

## 2020-11-21 ENCOUNTER — Other Ambulatory Visit: Payer: Self-pay

## 2020-11-21 DIAGNOSIS — I872 Venous insufficiency (chronic) (peripheral): Secondary | ICD-10-CM

## 2020-11-22 DIAGNOSIS — M25572 Pain in left ankle and joints of left foot: Secondary | ICD-10-CM | POA: Diagnosis not present

## 2020-12-01 ENCOUNTER — Other Ambulatory Visit: Payer: Self-pay | Admitting: Oncology

## 2020-12-02 DIAGNOSIS — M67962 Unspecified disorder of synovium and tendon, left lower leg: Secondary | ICD-10-CM | POA: Diagnosis not present

## 2020-12-02 DIAGNOSIS — M14671 Charcot's joint, right ankle and foot: Secondary | ICD-10-CM | POA: Diagnosis not present

## 2020-12-05 DIAGNOSIS — E11610 Type 2 diabetes mellitus with diabetic neuropathic arthropathy: Secondary | ICD-10-CM | POA: Diagnosis not present

## 2020-12-05 DIAGNOSIS — M5459 Other low back pain: Secondary | ICD-10-CM | POA: Insufficient documentation

## 2020-12-05 DIAGNOSIS — I10 Essential (primary) hypertension: Secondary | ICD-10-CM | POA: Diagnosis not present

## 2020-12-05 DIAGNOSIS — M5416 Radiculopathy, lumbar region: Secondary | ICD-10-CM | POA: Diagnosis not present

## 2020-12-05 DIAGNOSIS — D509 Iron deficiency anemia, unspecified: Secondary | ICD-10-CM | POA: Diagnosis not present

## 2020-12-05 DIAGNOSIS — E7800 Pure hypercholesterolemia, unspecified: Secondary | ICD-10-CM | POA: Diagnosis not present

## 2020-12-05 DIAGNOSIS — E1149 Type 2 diabetes mellitus with other diabetic neurological complication: Secondary | ICD-10-CM | POA: Diagnosis not present

## 2020-12-07 ENCOUNTER — Other Ambulatory Visit: Payer: Self-pay | Admitting: Oncology

## 2020-12-16 DIAGNOSIS — E1149 Type 2 diabetes mellitus with other diabetic neurological complication: Secondary | ICD-10-CM | POA: Diagnosis not present

## 2020-12-16 DIAGNOSIS — Z7984 Long term (current) use of oral hypoglycemic drugs: Secondary | ICD-10-CM | POA: Diagnosis not present

## 2020-12-16 DIAGNOSIS — E7800 Pure hypercholesterolemia, unspecified: Secondary | ICD-10-CM | POA: Diagnosis not present

## 2020-12-16 DIAGNOSIS — K279 Peptic ulcer, site unspecified, unspecified as acute or chronic, without hemorrhage or perforation: Secondary | ICD-10-CM | POA: Diagnosis not present

## 2020-12-16 DIAGNOSIS — E11610 Type 2 diabetes mellitus with diabetic neuropathic arthropathy: Secondary | ICD-10-CM | POA: Diagnosis not present

## 2020-12-16 DIAGNOSIS — G629 Polyneuropathy, unspecified: Secondary | ICD-10-CM | POA: Diagnosis not present

## 2020-12-16 DIAGNOSIS — I10 Essential (primary) hypertension: Secondary | ICD-10-CM | POA: Diagnosis not present

## 2020-12-16 DIAGNOSIS — C50919 Malignant neoplasm of unspecified site of unspecified female breast: Secondary | ICD-10-CM | POA: Diagnosis not present

## 2020-12-16 DIAGNOSIS — I872 Venous insufficiency (chronic) (peripheral): Secondary | ICD-10-CM | POA: Diagnosis not present

## 2020-12-19 ENCOUNTER — Ambulatory Visit (HOSPITAL_COMMUNITY)
Admission: RE | Admit: 2020-12-19 | Discharge: 2020-12-19 | Disposition: A | Discharge status: Home / Self Care | Payer: HMO | Source: Ambulatory Visit | Attending: Vascular Surgery | Admitting: Vascular Surgery

## 2020-12-19 ENCOUNTER — Other Ambulatory Visit: Payer: Self-pay

## 2020-12-19 ENCOUNTER — Ambulatory Visit: Payer: HMO | Admitting: Physician Assistant

## 2020-12-19 VITALS — BP 147/76 | HR 83 | Temp 97.3°F | Resp 14 | Ht 64.5 in | Wt 176.0 lb

## 2020-12-19 DIAGNOSIS — I872 Venous insufficiency (chronic) (peripheral): Secondary | ICD-10-CM | POA: Insufficient documentation

## 2020-12-19 DIAGNOSIS — M5416 Radiculopathy, lumbar region: Secondary | ICD-10-CM | POA: Diagnosis not present

## 2020-12-19 NOTE — Progress Notes (Signed)
Office Note     CC:  follow up Requesting Provider:  Orpah Melter, MD  HPI: Kristine Parrish is a 75 y.o. (1946-03-04) female who presents for follow up evaluation of venous insufficiency with reflux ultrasound. She was recently seen on 11/20/20 at which time she was evaluated for DVT with right lower extremity edema. No DVT was identified on duplex. She had no evidence of arterial insufficiency. She was advised to elevate and also use compression stockings. Follow up with reflux ultrasound was recommended.  She is here to review results of her ultrasound.  Her lower extremity symptoms include uncomfortable tightness and occasional itching and burning. Redness and swelling. She has had a rounds of antibiotics with concerns of cellulitis with no resolution. Her PCP thought it may be a dermatological problem or related to her Chemotherapy medication so she was referred back to her Oncologist. Ultimately she was sent for vascular evaluation. She has some OTC compression stockings which she cut the foot out of but she has difficulty with compression stockings due to limited strength and mobility not allowing her to get them on and off. She has some arthritis in her hands that is limiting. She does try to elevate but due to back problems she is limited with this as well. She sleeps in recliner and tries to use pillows to properly elevate but can only tolerate elevate in short intervals.  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  She reports lower extremity neuropathy in a stocking distribution.   Onset/duration:  at least one month  Occupation:  retired Aggravating factors: none Alleviating factors: none Compression:  no        Helps:  n/a Pain medications:  no Previous vein procedures:  no History of DVT:  no  Past Medical History:  Diagnosis Date   Arthritis    "hands "   Breast cancer of  upper-outer quadrant of left female breast ([place]) 08/15/2015   Colon polyps    Dyspnea    with exertion- "from chemo"   GERD (gastroesophageal reflux disease)    Hiatal hernia    History of bronchitis    History of radiation therapy 05/19/16- 06/29/16   Left Breast 50 Gy in 25 fractions, SCLV/PAB 45 Gy in 25 fractions, Left Breast boost 10 Gy in 5 fractions.    Hypertension    Neuropathy    from chemopathy   Peptic ulcer disease    Peripheral neuropathy    Personal history of chemotherapy    Personal history of radiation therapy    Type 2 diabetes mellitus ([place])    Type II    Past Surgical History:  Procedure Laterality Date   APPENDECTOMY  1974   BREAST LUMPECTOMY Left 02/14/2016   cataract surgery Bilateral 04/2012   CHOLECYSTECTOMY  1996   COLONOSCOPY     FOOT SURGERY     left bunion removed   nerve removed from left foot  2000   PORTACATH PLACEMENT N/A 09/05/2015   Procedure: INSERTION PORT-A-CATH;  Surgeon: Autumn Messing III, MD;  Location: [facility];  Service: General;  Laterality: N/A;   RADIOACTIVE SEED GUIDED PARTIAL MASTECTOMY/AXILLARY SENTINEL NODE BIOPSY/AXILLARY NODE DISSECTION Left 02/14/2016   Procedure: LEFT BREAST RADIOACTIVE SEED GUIDED LUMPECTOMY WITH LEFT  SENTINEL LYMPH NODE BIOPSY AND LEFT  SEED TARGETED DISSECTION;  Surgeon: Autumn Messing III, MD;  Location: [facility];  Service: General;  Laterality: Left;  TUBAL LIGATION     VAGINAL HYSTERECTOMY  1995    Social History   Socioeconomic History   Marital status: Divorced    Spouse name: Not on file   Number of children: 2   Years of education: 12   Highest education level: Not on file  Occupational History   Not on file  Tobacco Use   Smoking status: Never   Smokeless tobacco: Never  Substance and Sexual Activity   Alcohol use: No   Drug use: Never   Sexual activity: Not Currently  Other Topics Concern   Not on file  Social History Narrative   Patient is divorced.   Patient has two children   Patient works  in a clerical position.   Patient has a high school education.   Patient drinks caffeine rarely.   Social Determinants of Health   Financial Resource Strain: Not on file  Food Insecurity: Not on file  Transportation Needs: Not on file  Physical Activity: Not on file  Stress: Not on file  Social Connections: Not on file  Intimate Partner Violence: Not on file    Family History  Problem Relation Age of Onset   Diabetes Mother    Hypertension Mother    Alcoholism Maternal Grandfather    Congestive Heart Failure Maternal Grandmother     Current Outpatient Medications  Medication Sig Dispense Refill   gabapentin (NEURONTIN) 800 MG tablet Take 800 mg by mouth 3 (three) times daily.     glipiZIDE (GLUCOTROL) 10 MG tablet Take 10 mg by mouth daily before breakfast.     lovastatin (MEVACOR) 20 MG tablet Take 20 mg by mouth at bedtime.     metFORMIN (GLUCOPHAGE) 500 MG tablet Take 500 mg by mouth 2 (two) times daily with a meal.      Multiple Vitamin (MULTIVITAMIN) tablet Take 1 tablet by mouth daily.     omeprazole (PRILOSEC) 20 MG capsule Take 20 mg by mouth daily.     pioglitazone (ACTOS) 15 MG tablet Take 15 mg by mouth daily.     Probiotic Product (PROBIOTIC DAILY PO) Take 1 tablet by mouth daily. Reported on 08/21/2015     tamoxifen (NOLVADEX) 20 MG tablet TAKE ONE TABLET BY MOUTH ONCE DAILY 30 tablet 3   traZODone (DESYREL) 50 MG tablet Take 1 tablet (50 mg total) by mouth at bedtime. 90 tablet 4   losartan (COZAAR) 25 MG tablet Take 1 tablet (25 mg total) by mouth daily. 30 tablet 6   No current facility-administered medications for this visit.    No Active Allergies   REVIEW OF SYSTEMS:  [X]  denotes positive finding, [ ]  denotes negative finding Cardiac  Comments:  Chest pain or chest pressure:    Shortness of breath upon exertion:    Short of breath when lying flat:    Irregular heart rhythm:        Vascular    Pain in calf, thigh, or hip brought on by ambulation:     Pain in feet at night that wakes you up from your sleep:     Blood clot in your veins:    Leg swelling:         Pulmonary    Oxygen at home:    Productive cough:     Wheezing:         Neurologic    Sudden weakness in arms or legs:     Sudden numbness in arms or legs:     Sudden onset  of difficulty speaking or slurred speech:    Temporary loss of vision in one eye:     Problems with dizziness:         Gastrointestinal    Blood in stool:     Vomited blood:         Genitourinary    Burning when urinating:     Blood in urine:        Psychiatric    Major depression:         Hematologic    Bleeding problems:    Problems with blood clotting too easily:        Skin    Rashes or ulcers:        Constitutional    Fever or chills:      PHYSICAL EXAMINATION:  Vitals:   12/19/20 1437  Resp: 14  SpO2: 97%  Weight: 176 lb (79.8 kg)  Height: 5' 4.5" (1.638 m)    General:  WDWN in NAD; vital signs documented above Gait: Normal HENT: WNL, normocephalic Pulmonary: normal non-labored breathing , without wheezing Cardiac: regular HR Vascular Exam/Pulses: 2+ radial, palpable pedal pulses bilaterally Extremities: without varicose veins, with reticular veins, with edema, with stasis pigmentation, with lipodermatosclerosis, without ulcers. Venous stasis dermatitis of mid to distal right leg Musculoskeletal: no muscle wasting or atrophy  Neurologic: A&O X 3;  No focal weakness or paresthesias are detected Psychiatric:  The pt has Normal affect.   Non-Invasive Vascular Imaging:   RLE Venous Insufficiency Duplex (12/19/20):  RLE:  No DVT and SVT GSV reflux SFJ to knee GSV diameter 0.36-0.47 No SSV reflux CFV deep venous reflux AASV without reflux   ASSESSMENT/PLAN:: 75 y.o. female here for follow up for evaluation of venous insufficiency. Her duplex today shows venous insufficiency throughout right GSV from SFJ to knee. She additionally has deep reflux in the CFV. She has  no DVT or SVT and no SSV insufficiency. She does have an AASV but it is competent. Based on her duplex she could be a candidate for lazer ablation. I reviewed proper elevation with her. I also re discussed refraining from prolonged sitting or standing.  I discussed with the patient the use of her 20-30 mm thigh high compression stockings and need for 3 month trial of such. She obtained an open toe pair today and will try to see if she can get them on and off. I will discuss with Norberto Sorenson RN about special order for knee high open toe stockings as well The patient will follow up in 3 months with Dr. Scot Dock.   Karoline Caldwell, PA-C [facility] [phone]  On call MD:   Dr. Virl Cagey

## 2020-12-24 ENCOUNTER — Ambulatory Visit: Payer: HMO | Admitting: Oncology

## 2020-12-24 ENCOUNTER — Other Ambulatory Visit: Payer: HMO

## 2020-12-26 DIAGNOSIS — M5416 Radiculopathy, lumbar region: Secondary | ICD-10-CM | POA: Diagnosis not present

## 2021-01-07 ENCOUNTER — Other Ambulatory Visit: Payer: Self-pay

## 2021-01-07 DIAGNOSIS — C50412 Malignant neoplasm of upper-outer quadrant of left female breast: Secondary | ICD-10-CM

## 2021-01-07 DIAGNOSIS — Z170 Estrogen receptor positive status [ER+]: Secondary | ICD-10-CM

## 2021-01-07 NOTE — Progress Notes (Signed)
[facility]  [phone]) [phone] [phone]) [phone]     ID: AVIRA TILLISON DOB: May 16, 1945  MR#: [id number]  KKX#:[id number]  Patient Care Team: Orpah Melter, MD as PCP - General (Family Medicine) Jovita Kussmaul, MD as Consulting Physician (General Surgery) , Virgie Dad, MD as Consulting Physician (Oncology) Eppie Gibson, MD as Attending Physician (Radiation Oncology) Murvin Donning, MD (Dermatology) Laurence Spates, MD (Inactive) as Consulting Physician (Gastroenterology) Rosemary Holms, DPM as Consulting Physician (Podiatry) Maisie Fus, MD as Consulting Physician (Obstetrics and Gynecology) Larey Dresser, MD as Consulting Physician (Cardiology) Wylene Simmer, MD as Consulting Physician (Orthopedic Surgery) Laurin Coder, MD as Consulting Physician (Pulmonary Disease) OTHER MD:  CHIEF COMPLAINT: Estrogen receptor positive breast cancer/iron and b12 deficiency  CURRENT TREATMENT: Tamoxifen   INTERVAL HISTORY: Samya returns today for follow up of her estrogen receptor positive breast cancer.  She continues on tamoxifen, which she tolerates generally well.  Nevertheless she is looking forward to coming off the medication.  She only has a few more months to go.  Since her last visit, she underwent bilateral diagnostic mammography with tomography at [facility] on 11/05/2020 showing: breast density category C; left breast lumpectomy site and left axillary seroma are stable; no evidence of malignancy in either breast.   Of note, she was seen by vascular surgery on 12/19/2020 for right leg edema. She underwent duplex study that day showing venous insufficiency throughout right GCV from SFJ to knee, as well as deep reflux in CFV. Per PA Corrina's note, Allannah could be a candidate for later ablation.   REVIEW OF SYSTEMS: Sharalee has significant arthritis and degenerative disease and this is severely limiting.  In addition of course she has the  vascular problems.  She is now wearing compression stockings and that is helping.  Her arthritis does not improve with activity and she basically hurts all the time.  A detailed review of systems was otherwise stable.   COVID 19 VACCINATION STATUS: Status post Pfizer x2   BREAST CANCER HISTORY: From the original intake note  Alixis herself noted a change in her left breast and brought it to the attention of Dr. Nori Riis, who set her up for bilateral diagnostic mammography with tomography and left breast ultrasonography at the [facility] 08/09/2015. The breast density was category D. In the area of palpable concern there was a mass with irregular margins and architectural distortion, measuring approximately 3 cm. This was palpable in the upper outer quadrant near the nipple. The ultrasound confirmed an irregular hypoechoic mass approximately 3 cm from the nipple at the 10:00 position measuring 2.7 cm. Ultrasound of the left axilla found a single level I left axillary node with focal cortical thickening.  On 08/13/2015 the patient underwent biopsy of the left breast mass and the suspicious left axillary lymph node, both showing invasive ductal carcinoma, grade 2 or 3, estrogen receptor 100% positive, progesterone receptor 100% positive, both with strong staining intensity, with an MIB-1 of 50%, and HER-2 amplification, the signals ratio being 2.2 to and the number per cell 3.55  The patient's subsequent history is as detailed below   PAST MEDICAL HISTORY: Past Medical History:  Diagnosis Date   Arthritis    "hands "   Breast cancer of upper-outer quadrant of left female breast ([place]) 08/15/2015   Colon polyps    Dyspnea    with exertion- "from chemo"   GERD (gastroesophageal reflux disease)    Hiatal hernia    History  of bronchitis    History of radiation therapy 05/19/16- 06/29/16   Left Breast 50 Gy in 25 fractions, SCLV/PAB 45 Gy in 25 fractions, Left Breast boost 10 Gy in 5 fractions.     Hypertension    Neuropathy    from chemopathy   Peptic ulcer disease    Peripheral neuropathy    Personal history of chemotherapy    Personal history of radiation therapy    Type 2 diabetes mellitus ([place])    Type II    PAST SURGICAL HISTORY: Past Surgical History:  Procedure Laterality Date   APPENDECTOMY  1974   BREAST LUMPECTOMY Left 02/14/2016   cataract surgery Bilateral 04/2012   CHOLECYSTECTOMY  1996   COLONOSCOPY     FOOT SURGERY     left bunion removed   nerve removed from left foot  2000   PORTACATH PLACEMENT N/A 09/05/2015   Procedure: INSERTION PORT-A-CATH;  Surgeon: Autumn Messing III, MD;  Location: [facility];  Service: General;  Laterality: N/A;   RADIOACTIVE SEED GUIDED PARTIAL MASTECTOMY/AXILLARY SENTINEL NODE BIOPSY/AXILLARY NODE DISSECTION Left 02/14/2016   Procedure: LEFT BREAST RADIOACTIVE SEED GUIDED LUMPECTOMY WITH LEFT  SENTINEL LYMPH NODE BIOPSY AND LEFT  SEED TARGETED DISSECTION;  Surgeon: Autumn Messing III, MD;  Location: [facility];  Service: General;  Laterality: Left;   TUBAL LIGATION     VAGINAL HYSTERECTOMY  1995    FAMILY HISTORY Family History  Problem Relation Age of Onset   Diabetes Mother    Hypertension Mother    Alcoholism Maternal Grandfather    Congestive Heart Failure Maternal Grandmother   The patient has little information about her father and is not sure of his cause of death or age at death. The patient's mother died at age 53. The patient had no siblings. She was raised by her grandmother. She is not aware of any breast or ovarian cancer history in the family    GYNECOLOGIC HISTORY:  No LMP recorded. Patient has had a hysterectomy.  menarche age 57, first live birth age 75. The patient is GX P2. She underwent hysterectomy with bilateral salpingo-oophorectomy in 1994, and has been on estrogen replacement since that time, discontinued June 2017.    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  She is divorced and currently lives next door to her daughter, [name], who works as a Teacher, music for the [employer]. Son, Danikah Budzik lives in [place] where he is Secondary school teacher. The patient has one granddaughter, 33 years old as of August 2019.  The patient attends [employer]. The patient's daughter [name] is her healthcare power of attorney.    HEALTH MAINTENANCE: Social History   Tobacco Use   Smoking status: Never   Smokeless tobacco: Never  Substance Use Topics   Alcohol use: No   Drug use: Never     Colonoscopy: 2012/[facility]  PAP:  Bone density:  06/24/2016 shows a T score of -0.7 (normal)  Lipid panel:  No Active Allergies  Current Outpatient Medications  Medication Sig Dispense Refill   gabapentin (NEURONTIN) 800 MG tablet Take 800 mg by mouth 3 (three) times daily.     glipiZIDE (GLUCOTROL) 10 MG tablet Take 10 mg by mouth daily before breakfast.     losartan (COZAAR) 25 MG tablet Take 1 tablet (25 mg total) by mouth daily. 30 tablet 6  lovastatin (MEVACOR) 20 MG tablet Take 20 mg by mouth at bedtime.     metFORMIN (GLUCOPHAGE) 500 MG tablet Take 500 mg by mouth 2 (two) times daily with a meal.      Multiple Vitamin (MULTIVITAMIN) tablet Take 1 tablet by mouth daily.     omeprazole (PRILOSEC) 20 MG capsule Take 20 mg by mouth daily.     pioglitazone (ACTOS) 15 MG tablet Take 15 mg by mouth daily.     Probiotic Product (PROBIOTIC DAILY PO) Take 1 tablet by mouth daily. Reported on 08/21/2015     tamoxifen (NOLVADEX) 20 MG tablet TAKE ONE TABLET BY MOUTH ONCE DAILY 30 tablet 3   traZODone (DESYREL) 50 MG tablet Take 1 tablet (50 mg total) by mouth at bedtime. 90 tablet 4   No current facility-administered medications for this visit.    OBJECTIVE: White woman who appears stated age 12:   01/08/21 1026  BP: (!) 160/80  Pulse: 83  Resp: 16  Temp: 97.9 F (36.6 C)   SpO2: 100%   Wt Readings from Last 3 Encounters:  01/08/21 174 lb 12.8 oz (79.3 kg)  12/19/20 176 lb (79.8 kg)  11/20/20 177 lb (80.3 kg)   Body mass index is 30 kg/m.    ECOG FS:2 - Symptomatic, <50% confined to bed  Sclerae unicteric, EOMs intact Wearing a mask No cervical or supraclavicular adenopathy Lungs no rales or rhonchi Heart regular rate and rhythm Abd soft, nontender, positive bowel sounds MSK no focal spinal tenderness, no upper extremity lymphedema Neuro: nonfocal, well oriented, appropriate affect Breasts: The right breast is benign per the left breast is status postlumpectomy and radiation.  There is no evidence of local recurrence.  Both axillae are benign.   Right leg on 11/15/2020    LAB RESULTS:  CMP     Component Value Date/Time   NA 142 01/08/2021 1001   NA 140 12/28/2016 1043   K 3.7 01/08/2021 1001   K 4.1 12/28/2016 1043   CL 108 01/08/2021 1001   CO2 25 01/08/2021 1001   CO2 26 12/28/2016 1043   GLUCOSE 132 (H) 01/08/2021 1001   GLUCOSE 173 (H) 12/28/2016 1043   BUN 10 01/08/2021 1001   BUN 16.9 12/28/2016 1043   CREATININE 0.76 01/08/2021 1001   CREATININE 0.9 12/28/2016 1043   CALCIUM 8.7 (L) 01/08/2021 1001   CALCIUM 9.3 12/28/2016 1043   PROT 7.0 01/08/2021 1001   PROT 7.0 12/28/2016 1043   ALBUMIN 3.7 01/08/2021 1001   ALBUMIN 3.6 12/28/2016 1043   AST 10 (L) 01/08/2021 1001   AST 17 12/28/2016 1043   ALT 11 01/08/2021 1001   ALT 17 12/28/2016 1043   ALKPHOS 122 01/08/2021 1001   ALKPHOS 158 (H) 12/28/2016 1043   BILITOT 0.3 01/08/2021 1001   BILITOT 0.36 12/28/2016 1043   GFRNONAA >60 01/08/2021 1001   GFRAA >60 09/21/2019 0931    INo results found for: SPEP, UPEP  Lab Results  Component Value Date   WBC 5.8 01/08/2021   NEUTROABS 3.9 01/08/2021   HGB 12.2 01/08/2021   HCT 37.9 01/08/2021   MCV 94.0 01/08/2021   PLT 182 01/08/2021      Chemistry      Component Value Date/Time   NA 142 01/08/2021 1001   NA  140 12/28/2016 1043   K 3.7 01/08/2021 1001   K 4.1 12/28/2016 1043   CL 108 01/08/2021 1001   CO2 25 01/08/2021 1001   CO2 26 12/28/2016 1043  BUN 10 01/08/2021 1001   BUN 16.9 12/28/2016 1043   CREATININE 0.76 01/08/2021 1001   CREATININE 0.9 12/28/2016 1043      Component Value Date/Time   CALCIUM 8.7 (L) 01/08/2021 1001   CALCIUM 9.3 12/28/2016 1043   ALKPHOS 122 01/08/2021 1001   ALKPHOS 158 (H) 12/28/2016 1043   AST 10 (L) 01/08/2021 1001   AST 17 12/28/2016 1043   ALT 11 01/08/2021 1001   ALT 17 12/28/2016 1043   BILITOT 0.3 01/08/2021 1001   BILITOT 0.36 12/28/2016 1043     No results found for: LABCA2  No components found for: LABCA125  No results for input(s): INR in the last 168 hours.  Urinalysis No results found for: COLORURINE, APPEARANCEUR, LABSPEC, PHURINE, GLUCOSEU, HGBUR, BILIRUBINUR, KETONESUR, PROTEINUR, UROBILINOGEN, NITRITE, LEUKOCYTESUR   STUDIES: VAS [place] LOWER EXTREMITY VENOUS REFLUX  Result Date: 12/19/2020  Lower Venous Reflux Study Patient Name:  VAEDA WESTALL  Date of Exam:   12/19/2020 Medical Rec #: [id number]        Accession #:    [id number] Date of Birth: September 25, 1945         Patient Gender: F Patient Age:   86 years Exam Location:  [facility] Procedure:      VAS [place] LOWER EXTREMITY VENOUS REFLUX Referring Phys: Risa Grill --------------------------------------------------------------------------------  Indications: Right leg edema and chronic venous insufficiency.  Performing Technologist: Ronal Fear RVS, RCS  Examination Guidelines: A complete evaluation includes B-mode imaging, spectral Doppler, color Doppler, and power Doppler as needed of all accessible portions of each vessel. Bilateral testing is considered an integral part of a complete examination. Limited examinations for reoccurring indications may be performed as noted. The reflux portion of the exam is performed with the patient in reverse Trendelenburg.  Significant venous reflux is defined as >500 ms in the superficial venous system, and >1 second in the deep venous system.  Venous Reflux Times +------------------+---------+------+-----------+------------+--------+ RIGHT             Reflux NoRefluxReflux TimeDiameter cmsComments                             Yes                                  +------------------+---------+------+-----------+------------+--------+ CFV                         yes   >1 second                      +------------------+---------+------+-----------+------------+--------+ FV mid            no                                             +------------------+---------+------+-----------+------------+--------+ Popliteal         no                                             +------------------+---------+------+-----------+------------+--------+ GSV at SFJ                  yes    >  500 ms      0.88             +------------------+---------+------+-----------+------------+--------+ GSV prox thigh              yes    >500 ms      0.47             +------------------+---------+------+-----------+------------+--------+ GSV mid thigh               yes    >500 ms      0.45             +------------------+---------+------+-----------+------------+--------+ GSV dist thigh              yes    >500 ms      0.39             +------------------+---------+------+-----------+------------+--------+ GSV at knee                 yes    >500 ms      0.36             +------------------+---------+------+-----------+------------+--------+ SSV Pop Fossa     no                            0.30             +------------------+---------+------+-----------+------------+--------+ anterior accessoryno                            0.56             +------------------+---------+------+-----------+------------+--------+   Summary: Right: - No evidence of deep vein thrombosis from the common  femoral through the popliteal veins. - No evidence of superficial venous thrombosis. - The common femoral vein is not competent. - The great saphenous vein is not competent. - The small saphenous vein is competent.  *See table(s) above for measurements and observations. Electronically signed by Orlie Pollen on 12/19/2020 at 3:22:07 PM.    Final      ELIGIBLE FOR AVAILABLE RESEARCH PROTOCOL: Not eligible for PALLAS because HER-2 positive  ASSESSMENT: 75 y.o. [place] woman status post left breast upper outer quadrant and left axillary lymph node biopsy 08/13/2015 both positive for an invasive ductal carcinoma, grade 2 or 3, triple positive, with an MIB-1 of 50%  (1) neoadjuvant chemotherapy consisting of carboplatin, docetaxel, trastuzumab and pertuzumab given every 21 days Started 09/11/2015  (a) treatment changed to Abraxane and trastuzumab with cycle 2 because of side effects after cycle 1  (b) Abraxane discontinued after 3d dose (10/17/2015) because of peripheral neuropathy  (c) cyclophosphamide, methotrexate and fluorouracil (CMF) started 11/07/2015, 4 cycles, last dose 01/09/2016   (2) trastuzumab continued to complete a year (last dose September 24, 2016)  (a) final echocardiogram 07/23/2016 showed a well-preserved ejection fraction at 55-60%  (3) status post left lumpectomy with targeted axillary dissection 02/14/2016 showing a residual pT1c pN1 invasive ductal carcinoma, grade 2, estrogen and progesterone receptor positive, now HER-2 not amplified; margins were negative  (a) left axillary seroma/abscess drained surgically 03/25/2016, but persists  (4) adjuvant radiation 05/19/16 - 06/29/16  Site/dose:    Left breast - 4 field             Left breast: 50 Gy in 25 fractions             SCLV/PAB: 45 Gy in 25 fractions Left breast boost: 10 GY in 5 fractions  (  5) anastrozole started 07/07/2016 changed to tamoxifen due to consideration regarding arthralgias and myalgias  (a) bone density  06/24/2016 shows a T score of -0.7 (normal)  (b) density August 2021 shows a T score of -0.9 (normal)  (c) switched to tamoxifen June 2020  (6) Anemia related to decreased iron saturation and b12 deficiency  (a) Feraheme to be given x 2 starting on 08/04/2019  (b) b12 injections weekly x4 (interrupted slightly due to travel plans), then monthly   PLAN: Dorlisa is coming up on 5 years from definitive surgery for her breast cancer with no evidence of disease recurrence.  This is very favorable.  She will complete her 5 years of antiestrogen in May and will stop tamoxifen at that point.  She will be due for repeat mammography in August and then she will see is 1 last time September 2023 at which time she will be ready to "graduate" from follow-up here.  I encouraged her to continue to work with her orthopedists and vascular surgeons to see if she can improve her functional status some and become more physically active.  Water aerobics and water activity in general may be particularly helpful for her.  Total encounter time 20 minutes.Sarajane Jews C. , MD 01/08/21 1:01 PM Medical Oncology and Hematology [facility] [place], [place] [address] Tel. [phone]    Fax. [phone]   I, Wilburn Mylar, am acting as scribe for Dr. Virgie Dad. .  I, Lurline Del MD, have reviewed the above documentation for accuracy and completeness, and I agree with the above.    *Total Encounter Time as defined by the Centers for Medicare and Medicaid Services includes, in addition to the face-to-face time of a patient visit (documented in the note above) non-face-to-face time: obtaining and reviewing outside history, ordering and reviewing medications, tests or procedures, care coordination (communications with other health care professionals or caregivers) and documentation in the medical record.

## 2021-01-08 ENCOUNTER — Inpatient Hospital Stay: Payer: HMO | Admitting: Oncology

## 2021-01-08 ENCOUNTER — Other Ambulatory Visit: Payer: Self-pay

## 2021-01-08 ENCOUNTER — Inpatient Hospital Stay: Payer: HMO | Attending: Adult Health

## 2021-01-08 VITALS — BP 160/80 | HR 83 | Temp 97.9°F | Resp 16 | Ht 64.0 in | Wt 174.8 lb

## 2021-01-08 DIAGNOSIS — Z9221 Personal history of antineoplastic chemotherapy: Secondary | ICD-10-CM | POA: Insufficient documentation

## 2021-01-08 DIAGNOSIS — M1990 Unspecified osteoarthritis, unspecified site: Secondary | ICD-10-CM | POA: Diagnosis not present

## 2021-01-08 DIAGNOSIS — Z923 Personal history of irradiation: Secondary | ICD-10-CM | POA: Diagnosis not present

## 2021-01-08 DIAGNOSIS — Z170 Estrogen receptor positive status [ER+]: Secondary | ICD-10-CM | POA: Insufficient documentation

## 2021-01-08 DIAGNOSIS — C50412 Malignant neoplasm of upper-outer quadrant of left female breast: Secondary | ICD-10-CM | POA: Insufficient documentation

## 2021-01-08 DIAGNOSIS — M545 Low back pain, unspecified: Secondary | ICD-10-CM | POA: Diagnosis not present

## 2021-01-08 DIAGNOSIS — D649 Anemia, unspecified: Secondary | ICD-10-CM | POA: Insufficient documentation

## 2021-01-08 DIAGNOSIS — E538 Deficiency of other specified B group vitamins: Secondary | ICD-10-CM | POA: Insufficient documentation

## 2021-01-08 DIAGNOSIS — Z79810 Long term (current) use of selective estrogen receptor modulators (SERMs): Secondary | ICD-10-CM | POA: Insufficient documentation

## 2021-01-08 LAB — CBC WITH DIFFERENTIAL (CANCER CENTER ONLY)
Abs Immature Granulocytes: 0.02 10*3/uL (ref 0.00–0.07)
Basophils Absolute: 0 10*3/uL (ref 0.0–0.1)
Basophils Relative: 0 %
Eosinophils Absolute: 0.3 10*3/uL (ref 0.0–0.5)
Eosinophils Relative: 5 %
HCT: 37.9 % (ref 36.0–46.0)
Hemoglobin: 12.2 g/dL (ref 12.0–15.0)
Immature Granulocytes: 0 %
Lymphocytes Relative: 18 %
Lymphs Abs: 1 10*3/uL (ref 0.7–4.0)
MCH: 30.3 pg (ref 26.0–34.0)
MCHC: 32.2 g/dL (ref 30.0–36.0)
MCV: 94 fL (ref 80.0–100.0)
Monocytes Absolute: 0.5 10*3/uL (ref 0.1–1.0)
Monocytes Relative: 9 %
Neutro Abs: 3.9 10*3/uL (ref 1.7–7.7)
Neutrophils Relative %: 68 %
Platelet Count: 182 10*3/uL (ref 150–400)
RBC: 4.03 MIL/uL (ref 3.87–5.11)
RDW: 13.2 % (ref 11.5–15.5)
WBC Count: 5.8 10*3/uL (ref 4.0–10.5)
nRBC: 0 % (ref 0.0–0.2)

## 2021-01-08 LAB — CMP (CANCER CENTER ONLY)
ALT: 11 U/L (ref 0–44)
AST: 10 U/L — ABNORMAL LOW (ref 15–41)
Albumin: 3.7 g/dL (ref 3.5–5.0)
Alkaline Phosphatase: 122 U/L (ref 38–126)
Anion gap: 9 (ref 5–15)
BUN: 10 mg/dL (ref 8–23)
CO2: 25 mmol/L (ref 22–32)
Calcium: 8.7 mg/dL — ABNORMAL LOW (ref 8.9–10.3)
Chloride: 108 mmol/L (ref 98–111)
Creatinine: 0.76 mg/dL (ref 0.44–1.00)
GFR, Estimated: >60 mL/min (ref >60)
Glucose, Bld: 132 mg/dL — ABNORMAL HIGH (ref 70–99)
Potassium: 3.7 mmol/L (ref 3.5–5.1)
Sodium: 142 mmol/L (ref 135–145)
Total Bilirubin: 0.3 mg/dL (ref 0.3–1.2)
Total Protein: 7 g/dL (ref 6.5–8.1)

## 2021-01-08 LAB — FERRITIN: Ferritin: 200 ng/mL (ref 11–307)

## 2021-01-08 LAB — IRON AND TIBC
Iron: 51 ug/dL (ref 41–142)
Saturation Ratios: 20 % — ABNORMAL LOW (ref 21–57)
TIBC: 250 ug/dL (ref 236–444)
UIBC: 199 ug/dL (ref 120–384)

## 2021-01-08 LAB — VITAMIN B12: Vitamin B-12: 1374 pg/mL — ABNORMAL HIGH (ref 180–914)

## 2021-01-08 MED ORDER — TAMOXIFEN CITRATE 20 MG PO TABS: 20.0000 mg | ORAL_TABLET | Freq: Every day | ORAL | 3 refills | Status: DC

## 2021-01-10 DIAGNOSIS — M545 Low back pain, unspecified: Secondary | ICD-10-CM | POA: Diagnosis not present

## 2021-01-16 DIAGNOSIS — M545 Low back pain, unspecified: Secondary | ICD-10-CM | POA: Diagnosis not present

## 2021-01-20 DIAGNOSIS — M545 Low back pain, unspecified: Secondary | ICD-10-CM | POA: Diagnosis not present

## 2021-01-21 DIAGNOSIS — M5416 Radiculopathy, lumbar region: Secondary | ICD-10-CM | POA: Diagnosis not present

## 2021-01-22 IMAGING — CT CT CHEST WITH CONTRAST
1 series · 14 of 34 positions shown, 18 images · IV contrast (APPLIED)
Comparison: No comparison CTs.

CLINICAL DATA: 73-year-old female with a history of dyspnea and
anemia

EXAM:
CT CHEST WITH CONTRAST
TECHNIQUE: Multidetector CT imaging of the chest was performed during
intravenous contrast administration.
CONTRAST:  75mL K0KKQU-GMM IOPAMIDOL (K0KKQU-GMM) INJECTION 61%

[Series 2: chest w/cm · axial · 0.70mm/px · z∈[-269,-9]mm · 14 of 154 slices shown, 18 images]
[im 12/154  mediastinal]
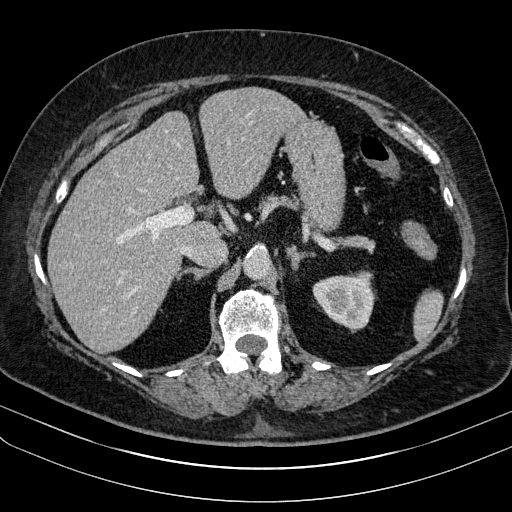
[im 12/154  lung]
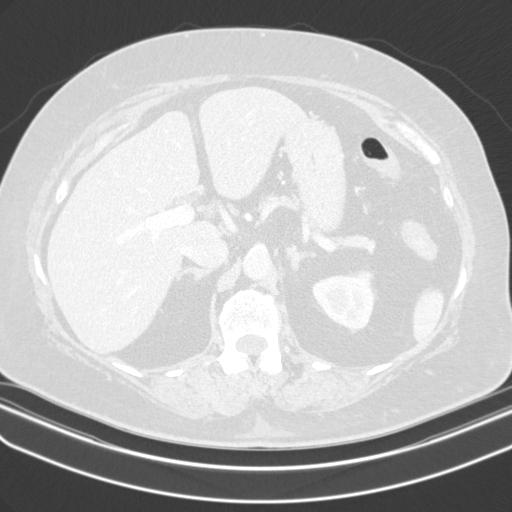
[im 23/154  lung]
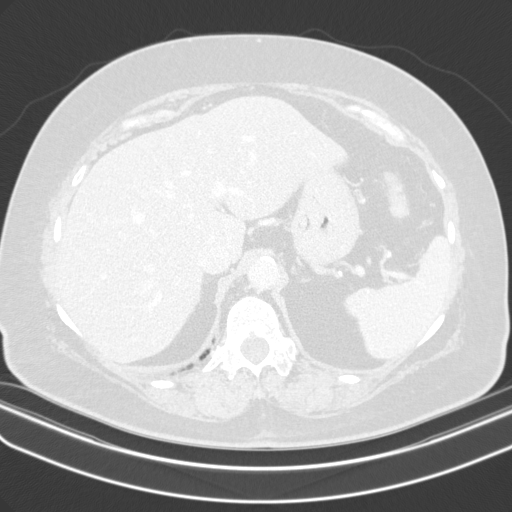
[im 31/154  lung]
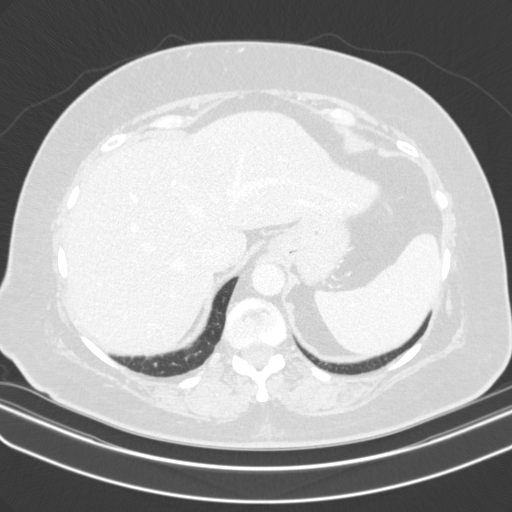
[im 46/154  lung]
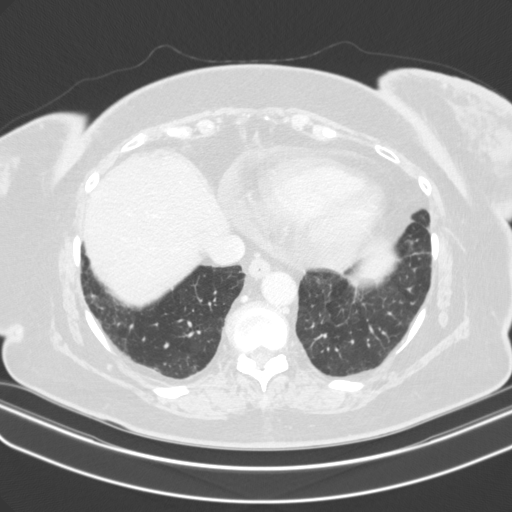
[im 57/154  mediastinal]
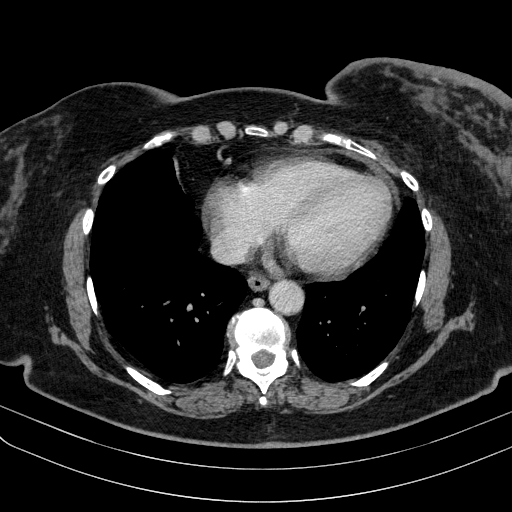
[im 57/154  lung]
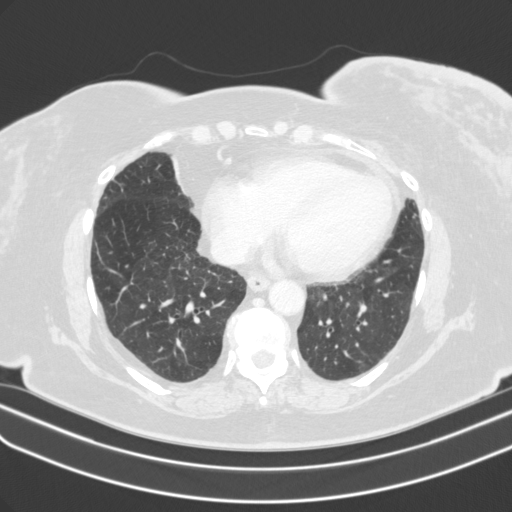
[im 63/154  lung]
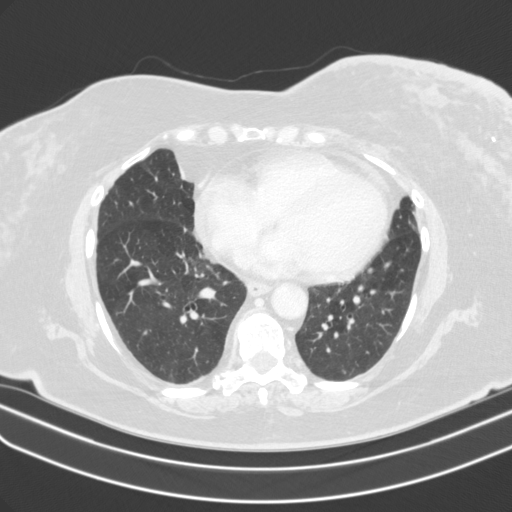
[im 73/154  lung]
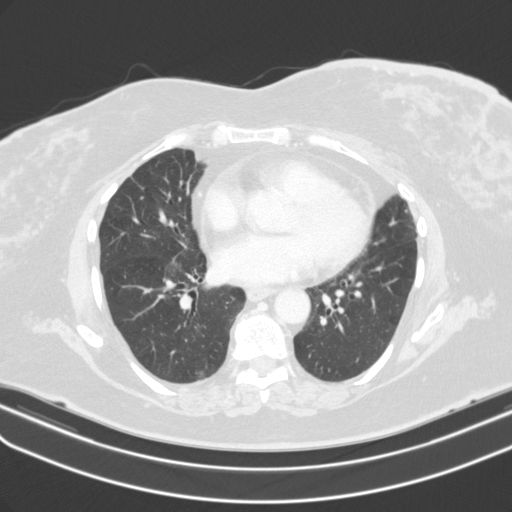
[im 82/154  lung]
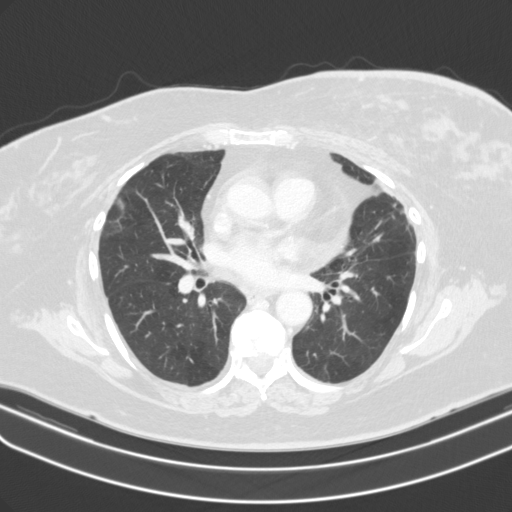
[im 91/154  mediastinal]
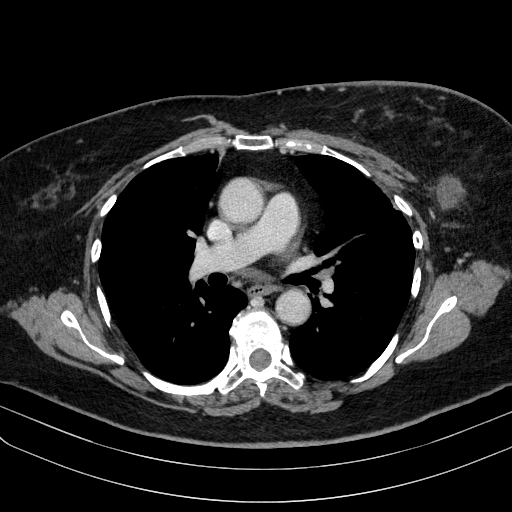
[im 91/154  lung]
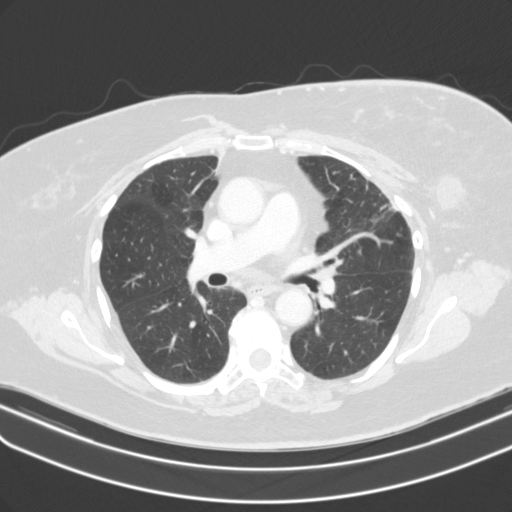
[im 97/154  lung]
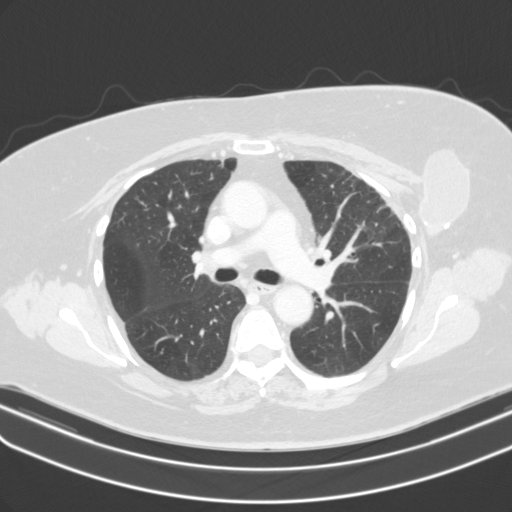
[im 114/154  lung]
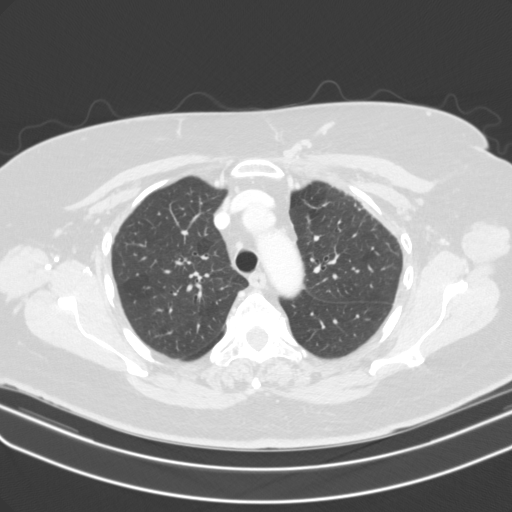
[im 123/154  lung]
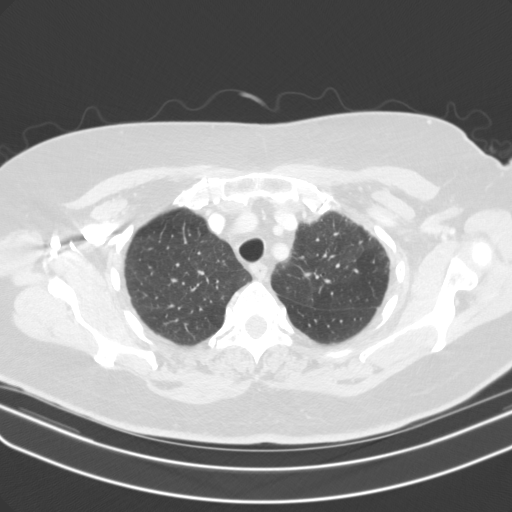
[im 131/154  mediastinal]
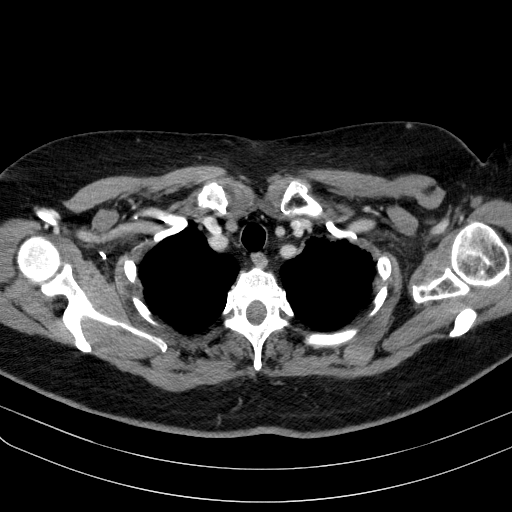
[im 131/154  lung]
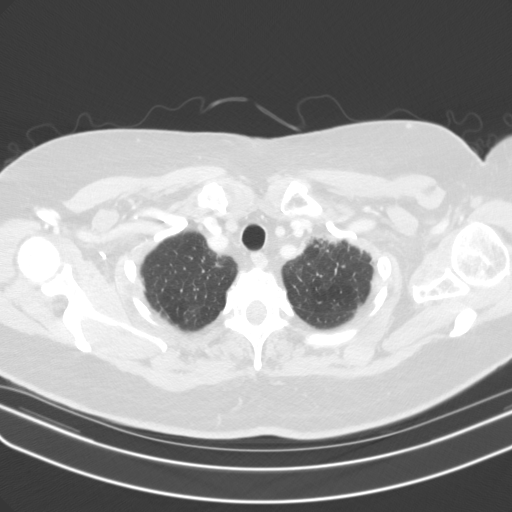
[im 142/154  lung]
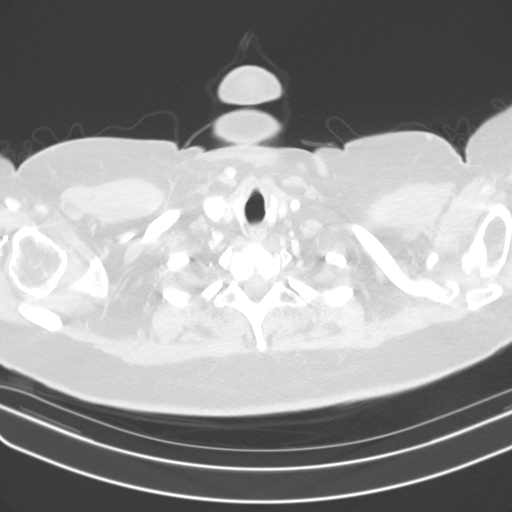

[14 of 34 positions shown; findings below may reference images not displayed]

FINDINGS: Cardiovascular: Heart size within normal limits. Trace pericardial
fluid/thickening. No significant calcified coronary artery disease.

Normal course caliber contour of the thoracic aorta with minimal
atherosclerosis.

Timing of the contrast bolus not optimized for pulmonary arteries,
however, main pulmonary artery within normal limits in diameter with
no central filling defects.

Mediastinum/Nodes: Small lymph nodes of the mediastinum, none of
which are enlarged. The index node in the lowest right paratracheal
station measures 6 mm-7 mm.

Unremarkable thoracic inlet

Unremarkable esophagus.

Lungs/Pleura: No pneumothorax or pleural effusion. Early
bronchiectasis bilateral lungs. Mild architectural distortion of the
lingula and the right middle lobe. No confluent airspace disease.

Upper Abdomen: No acute finding of the upper abdomen.

Musculoskeletal: No acute displaced fracture. Multilevel
degenerative changes of the spine. No bony canal narrowing.

Chest wall:

Surgical changes of the left axilla. Adjacent to the surgical clips
there is a rounded low-density structure measuring 5.9 cm x 2 point
9 cm, with fluid density Hounsfield units. No lymphadenopathy. Small
lymph nodes in the right axillary region.

Asymmetry of the left breast compared to the right with significant
skin thickening on the CT. Surgical clips within the breast.
IMPRESSION: No acute CT finding.

Surgical changes of the left breast, as well as left axillary nodal
dissection. There is a cystic structure associated with the nodal
dissection of the axilla, measuring as large as 5.9 cm, most likely
a seroma or lymphocele.

The left breast skin is significantly thickened compared to the
right. Correlation with physical exam and the patient's radiation
treatment schedule is recommended, as this most likely represents
treatment changes. Progression of disease, including lymphatic
involvement of the superficial tissues not excluded.

Chronic lung changes include bronchiectasis, evidence of small
airway disease, and mild architectural distortion.

## 2021-01-23 DIAGNOSIS — M545 Low back pain, unspecified: Secondary | ICD-10-CM | POA: Diagnosis not present

## 2021-01-27 DIAGNOSIS — M545 Low back pain, unspecified: Secondary | ICD-10-CM | POA: Diagnosis not present

## 2021-02-04 DIAGNOSIS — M545 Low back pain, unspecified: Secondary | ICD-10-CM | POA: Diagnosis not present

## 2021-02-05 DIAGNOSIS — M5416 Radiculopathy, lumbar region: Secondary | ICD-10-CM | POA: Diagnosis not present

## 2021-02-05 DIAGNOSIS — M545 Low back pain, unspecified: Secondary | ICD-10-CM | POA: Diagnosis not present

## 2021-02-06 ENCOUNTER — Other Ambulatory Visit: Payer: HMO

## 2021-02-06 ENCOUNTER — Ambulatory Visit: Payer: HMO | Admitting: Oncology

## 2021-02-06 DIAGNOSIS — M545 Low back pain, unspecified: Secondary | ICD-10-CM | POA: Diagnosis not present

## 2021-03-05 DIAGNOSIS — M545 Low back pain, unspecified: Secondary | ICD-10-CM | POA: Diagnosis not present

## 2021-03-06 DIAGNOSIS — I10 Essential (primary) hypertension: Secondary | ICD-10-CM | POA: Diagnosis not present

## 2021-03-06 DIAGNOSIS — D509 Iron deficiency anemia, unspecified: Secondary | ICD-10-CM | POA: Diagnosis not present

## 2021-03-06 DIAGNOSIS — E7800 Pure hypercholesterolemia, unspecified: Secondary | ICD-10-CM | POA: Diagnosis not present

## 2021-03-06 DIAGNOSIS — C50919 Malignant neoplasm of unspecified site of unspecified female breast: Secondary | ICD-10-CM | POA: Diagnosis not present

## 2021-03-06 DIAGNOSIS — E11610 Type 2 diabetes mellitus with diabetic neuropathic arthropathy: Secondary | ICD-10-CM | POA: Diagnosis not present

## 2021-03-06 DIAGNOSIS — E1149 Type 2 diabetes mellitus with other diabetic neurological complication: Secondary | ICD-10-CM | POA: Diagnosis not present

## 2021-03-12 ENCOUNTER — Other Ambulatory Visit: Payer: Self-pay

## 2021-03-12 ENCOUNTER — Ambulatory Visit: Payer: HMO | Admitting: Vascular Surgery

## 2021-03-12 ENCOUNTER — Encounter: Payer: Self-pay | Admitting: Vascular Surgery

## 2021-03-12 VITALS — BP 115/62 | HR 79 | Temp 98.2°F | Resp 16 | Ht 64.0 in | Wt 169.0 lb

## 2021-03-12 DIAGNOSIS — R2241 Localized swelling, mass and lump, right lower limb: Secondary | ICD-10-CM

## 2021-03-12 DIAGNOSIS — I872 Venous insufficiency (chronic) (peripheral): Secondary | ICD-10-CM | POA: Diagnosis not present

## 2021-03-12 NOTE — Progress Notes (Signed)
ASSESSMENT & PLAN   CHRONIC VENOUS INSUFFICIENCY: This patient has significant right lower extremity swelling from the knee down.  She has had 2 venous duplex scans which showed no evidence of DVT.  She does have some deep venous reflux in the common femoral vein but otherwise no significant reflux in the deep system.  She has some reflux in the right great saphenous vein however the vein is not markedly dilated.  I am not convinced that addressing her superficial venous reflux would significantly improve the swelling in the right leg as the swelling is fairly impressive.  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  Based on her exam she has evidence of CEAP C4b venous disease (hyperpigmentation with hyperkeratosis).  We have discussed the importance of intermittent leg elevation and the proper positioning for this.  She is somewhat limited in the she has back pain which prevents her from lying flat.  I have encouraged her to avoid prolonged sitting and standing.  We have discussed importance of exercise specifically walking and water aerobics.  We also discussed the importance of maintaining a healthy weight.  In addition I encouraged her to keep her skin well lubricated.  I think given the location of the swelling the knee-high stocking may be more practical.  If her swelling does not improve then I think we should consider a CT venogram of the abdomen and pelvis but also to include the right lower extremity to rule out any proximal source of obstruction of the venous or lymphatic system.  We have decided to give the conservative measures sometime before proceeding with a more extensive work-up.  If her swelling does not improve and any further work-up is unremarkable the only other consideration would be a pneumatic compression device to treat any component of underlying lymphedema.  I will plan on seeing her back in 6 months.  She knows to call sooner if she has  problems.  REASON FOR VISIT:    69-month follow-up for chronic venous insufficiency.  HPI:   Kristine Parrish is a 76 y.o. female who was seen by Karoline Caldwell, PA on 12/19/2020 with chronic venous insufficiency.  She had been seen in September of last year to rule out a DVT of the right lower extremity as she was having swelling in the right leg.  This was negative.  She had no evidence of arterial insufficiency.  Because of the limited strength and mobility she was having a hard time wearing compression stockings.  She has been trying to elevate her legs.  She was prescribed thigh-high compression stockings with a gradient of 20 to 30 mmHg.  She comes in for 65-month follow-up visit.  On my history, the patient developed swelling approximately 6 months ago.  This appeared to be limited mostly to the right lower extremity.  She tried knee-high compression stockings without much relief and has now been wearing thigh-high stockings which do help some.  The swelling is improved but the right leg is still significantly swollen.  She describes some aching pain associated with this.  She is had no previous history of DVT.  She is now had 2 venous duplex scans which showed no evidence of DVT in the right lower extremity.  She denies any previous surgery or radiation therapy in her abdomen or groins except for hysterectomy in the past and tubal ligation.  She does have a history of breast cancer and is undergoing lumpectomy, radiation therapy and chemo.  This  is reportedly under good control.   Past Medical History:  Diagnosis Date   Arthritis    "hands "   Breast cancer of upper-outer quadrant of left female breast ([place]) 08/15/2015   Colon polyps    Dyspnea    with exertion- "from chemo"   GERD (gastroesophageal reflux disease)    Hiatal hernia    History of bronchitis    History of radiation therapy 05/19/16- 06/29/16   Left Breast 50 Gy in 25 fractions, SCLV/PAB 45 Gy in 25 fractions, Left Breast  boost 10 Gy in 5 fractions.    Hypertension    Neuropathy    from chemopathy   Peptic ulcer disease    Peripheral neuropathy    Personal history of chemotherapy    Personal history of radiation therapy    Type 2 diabetes mellitus ([place])    Type II    Family History  Problem Relation Age of Onset   Diabetes Mother    Hypertension Mother    Alcoholism Maternal Grandfather    Congestive Heart Failure Maternal Grandmother     SOCIAL HISTORY: Social History   Tobacco Use   Smoking status: Never   Smokeless tobacco: Never  Substance Use Topics   Alcohol use: No    No Active Allergies  Current Outpatient Medications  Medication Sig Dispense Refill   gabapentin (NEURONTIN) 800 MG tablet Take 800 mg by mouth 3 (three) times daily.     glipiZIDE (GLUCOTROL) 10 MG tablet Take 10 mg by mouth daily before breakfast.     lovastatin (MEVACOR) 20 MG tablet Take 20 mg by mouth at bedtime.     metFORMIN (GLUCOPHAGE) 500 MG tablet Take 500 mg by mouth 2 (two) times daily with a meal.      Multiple Vitamin (MULTIVITAMIN) tablet Take 1 tablet by mouth daily.     omeprazole (PRILOSEC) 20 MG capsule Take 20 mg by mouth daily.     pioglitazone (ACTOS) 15 MG tablet Take 15 mg by mouth daily.     Probiotic Product (PROBIOTIC DAILY PO) Take 1 tablet by mouth daily. Reported on 08/21/2015     tamoxifen (NOLVADEX) 20 MG tablet Take 1 tablet (20 mg total) by mouth daily. 90 tablet 3   traZODone (DESYREL) 50 MG tablet Take 1 tablet (50 mg total) by mouth at bedtime. 90 tablet 4   losartan (COZAAR) 25 MG tablet Take 1 tablet (25 mg total) by mouth daily. 30 tablet 6   No current facility-administered medications for this visit.    REVIEW OF SYSTEMS:  [X]  denotes positive finding, [ ]  denotes negative finding Cardiac  Comments:  Chest pain or chest pressure:    Shortness of breath upon exertion:    Short of breath when lying flat:    Irregular heart rhythm:        Vascular    Pain in calf,  thigh, or hip brought on by ambulation:    Pain in feet at night that wakes you up from your sleep:  x   Blood clot in your veins:    Leg swelling:  x       Pulmonary    Oxygen at home:    Productive cough:     Wheezing:         Neurologic    Sudden weakness in arms or legs:     Sudden numbness in arms or legs:     Sudden onset of difficulty speaking or slurred speech:    Temporary  loss of vision in one eye:     Problems with dizziness:         Gastrointestinal    Blood in stool:     Vomited blood:         Genitourinary    Burning when urinating:     Blood in urine:        Psychiatric    Major depression:         Hematologic    Bleeding problems:    Problems with blood clotting too easily:        Skin    Rashes or ulcers:        Constitutional    Fever or chills:    -  PHYSICAL EXAM:   Vitals:   03/12/21 1346  BP: 115/62  Pulse: 79  Resp: 16  Temp: 98.2 F (36.8 C)  TempSrc: Temporal  SpO2: (!) 79%  Weight: 169 lb (76.7 kg)  Height: 5\' 4"  (1.626 m)   Body mass index is 29.01 kg/m. GENERAL: The patient is a well-nourished female, in no acute distress. The vital signs are documented above. CARDIAC: There is a regular rate and rhythm.  VASCULAR: I do not detect carotid bruits. She has palpable pedal pulses. She has significant right lower extremity swelling from the knee down.  There is no significant swelling in the thigh.  She does have some hyperpigmentation and mild hyperkeratosis in the right leg.  There is a nonpitting component to the swelling also possibly consistent with lymphedema. I did look at the right great saphenous vein myself with the SonoSite.  The vein is dilated with reflux down to the knee. PULMONARY: There is good air exchange bilaterally without wheezing or rales. ABDOMEN: Soft and non-tender with normal pitched bowel sounds.  MUSCULOSKELETAL: There are no major deformities. NEUROLOGIC: No focal weakness or paresthesias are  detected. SKIN: There are no ulcers or rashes noted. PSYCHIATRIC: The patient has a normal affect.  DATA:    VENOUS DUPLEX: I have reviewed the venous duplex scan that was done on 12/19/2020.  This was of the right lower extremity only.  There was no evidence of DVT.  There was deep venous reflux involving the common femoral vein.  There was superficial venous reflux from the saphenofemoral junction to the knee.  Diameters of the vein ranged from 4 to 5 mm.    A total of 42 minutes was spent on this visit. 22 minutes was face to face time. More than 50% of the time was spent on counseling and coordinating with the patient.    Deitra Mayo [facility]

## 2021-05-05 DIAGNOSIS — E1149 Type 2 diabetes mellitus with other diabetic neurological complication: Secondary | ICD-10-CM | POA: Diagnosis not present

## 2021-05-05 DIAGNOSIS — E7800 Pure hypercholesterolemia, unspecified: Secondary | ICD-10-CM | POA: Diagnosis not present

## 2021-05-05 DIAGNOSIS — I10 Essential (primary) hypertension: Secondary | ICD-10-CM | POA: Diagnosis not present

## 2021-05-08 DIAGNOSIS — H18593 Other hereditary corneal dystrophies, bilateral: Secondary | ICD-10-CM | POA: Diagnosis not present

## 2021-05-08 DIAGNOSIS — H04129 Dry eye syndrome of unspecified lacrimal gland: Secondary | ICD-10-CM | POA: Diagnosis not present

## 2021-05-08 DIAGNOSIS — H353131 Nonexudative age-related macular degeneration, bilateral, early dry stage: Secondary | ICD-10-CM | POA: Diagnosis not present

## 2021-05-08 DIAGNOSIS — E119 Type 2 diabetes mellitus without complications: Secondary | ICD-10-CM | POA: Diagnosis not present

## 2021-06-02 DIAGNOSIS — M5416 Radiculopathy, lumbar region: Secondary | ICD-10-CM | POA: Diagnosis not present

## 2021-06-03 DIAGNOSIS — E7800 Pure hypercholesterolemia, unspecified: Secondary | ICD-10-CM | POA: Diagnosis not present

## 2021-06-03 DIAGNOSIS — I10 Essential (primary) hypertension: Secondary | ICD-10-CM | POA: Diagnosis not present

## 2021-06-03 DIAGNOSIS — E1149 Type 2 diabetes mellitus with other diabetic neurological complication: Secondary | ICD-10-CM | POA: Diagnosis not present

## 2021-06-18 DIAGNOSIS — G629 Polyneuropathy, unspecified: Secondary | ICD-10-CM | POA: Diagnosis not present

## 2021-06-18 DIAGNOSIS — K279 Peptic ulcer, site unspecified, unspecified as acute or chronic, without hemorrhage or perforation: Secondary | ICD-10-CM | POA: Diagnosis not present

## 2021-06-18 DIAGNOSIS — L989 Disorder of the skin and subcutaneous tissue, unspecified: Secondary | ICD-10-CM | POA: Diagnosis not present

## 2021-06-18 DIAGNOSIS — Z0000 Encounter for general adult medical examination without abnormal findings: Secondary | ICD-10-CM | POA: Diagnosis not present

## 2021-06-18 DIAGNOSIS — E11610 Type 2 diabetes mellitus with diabetic neuropathic arthropathy: Secondary | ICD-10-CM | POA: Diagnosis not present

## 2021-06-18 DIAGNOSIS — Z7984 Long term (current) use of oral hypoglycemic drugs: Secondary | ICD-10-CM | POA: Diagnosis not present

## 2021-06-18 DIAGNOSIS — E1149 Type 2 diabetes mellitus with other diabetic neurological complication: Secondary | ICD-10-CM | POA: Diagnosis not present

## 2021-06-18 DIAGNOSIS — Z23 Encounter for immunization: Secondary | ICD-10-CM | POA: Diagnosis not present

## 2021-06-18 DIAGNOSIS — I10 Essential (primary) hypertension: Secondary | ICD-10-CM | POA: Diagnosis not present

## 2021-06-18 DIAGNOSIS — C50919 Malignant neoplasm of unspecified site of unspecified female breast: Secondary | ICD-10-CM | POA: Diagnosis not present

## 2021-06-18 DIAGNOSIS — E7800 Pure hypercholesterolemia, unspecified: Secondary | ICD-10-CM | POA: Diagnosis not present

## 2021-07-16 DIAGNOSIS — D485 Neoplasm of uncertain behavior of skin: Secondary | ICD-10-CM | POA: Diagnosis not present

## 2021-07-16 DIAGNOSIS — D0361 Melanoma in situ of right upper limb, including shoulder: Secondary | ICD-10-CM | POA: Diagnosis not present

## 2021-07-18 DIAGNOSIS — L089 Local infection of the skin and subcutaneous tissue, unspecified: Secondary | ICD-10-CM | POA: Diagnosis not present

## 2021-07-18 DIAGNOSIS — L97521 Non-pressure chronic ulcer of other part of left foot limited to breakdown of skin: Secondary | ICD-10-CM | POA: Diagnosis not present

## 2021-07-22 DIAGNOSIS — L97521 Non-pressure chronic ulcer of other part of left foot limited to breakdown of skin: Secondary | ICD-10-CM | POA: Diagnosis not present

## 2021-08-28 ENCOUNTER — Other Ambulatory Visit: Payer: Self-pay | Admitting: Hematology and Oncology

## 2021-08-28 DIAGNOSIS — Z853 Personal history of malignant neoplasm of breast: Secondary | ICD-10-CM

## 2021-09-02 DIAGNOSIS — E7800 Pure hypercholesterolemia, unspecified: Secondary | ICD-10-CM | POA: Diagnosis not present

## 2021-09-02 DIAGNOSIS — I10 Essential (primary) hypertension: Secondary | ICD-10-CM | POA: Diagnosis not present

## 2021-09-02 DIAGNOSIS — E1149 Type 2 diabetes mellitus with other diabetic neurological complication: Secondary | ICD-10-CM | POA: Diagnosis not present

## 2021-09-02 DIAGNOSIS — D509 Iron deficiency anemia, unspecified: Secondary | ICD-10-CM | POA: Diagnosis not present

## 2021-09-02 DIAGNOSIS — E11610 Type 2 diabetes mellitus with diabetic neuropathic arthropathy: Secondary | ICD-10-CM | POA: Diagnosis not present

## 2021-09-03 DIAGNOSIS — C4331 Malignant melanoma of nose: Secondary | ICD-10-CM | POA: Diagnosis not present

## 2021-09-03 DIAGNOSIS — L989 Disorder of the skin and subcutaneous tissue, unspecified: Secondary | ICD-10-CM | POA: Diagnosis not present

## 2021-09-03 DIAGNOSIS — C4361 Malignant melanoma of right upper limb, including shoulder: Secondary | ICD-10-CM | POA: Diagnosis not present

## 2021-09-16 DIAGNOSIS — Z85820 Personal history of malignant melanoma of skin: Secondary | ICD-10-CM | POA: Diagnosis not present

## 2021-09-16 DIAGNOSIS — Z08 Encounter for follow-up examination after completed treatment for malignant neoplasm: Secondary | ICD-10-CM | POA: Diagnosis not present

## 2021-09-16 DIAGNOSIS — L821 Other seborrheic keratosis: Secondary | ICD-10-CM | POA: Diagnosis not present

## 2021-09-16 DIAGNOSIS — D1801 Hemangioma of skin and subcutaneous tissue: Secondary | ICD-10-CM | POA: Diagnosis not present

## 2021-09-16 DIAGNOSIS — L814 Other melanin hyperpigmentation: Secondary | ICD-10-CM | POA: Diagnosis not present

## 2021-10-02 DIAGNOSIS — Z79899 Other long term (current) drug therapy: Secondary | ICD-10-CM | POA: Diagnosis not present

## 2021-10-02 DIAGNOSIS — Z5181 Encounter for therapeutic drug level monitoring: Secondary | ICD-10-CM | POA: Diagnosis not present

## 2021-10-02 DIAGNOSIS — M5416 Radiculopathy, lumbar region: Secondary | ICD-10-CM | POA: Diagnosis not present

## 2021-11-06 ENCOUNTER — Ambulatory Visit
Admission: RE | Admit: 2021-11-06 | Discharge: 2021-11-06 | Disposition: A | Discharge status: Home / Self Care | Payer: HMO | Source: Ambulatory Visit | Attending: Hematology and Oncology | Admitting: Hematology and Oncology

## 2021-11-06 DIAGNOSIS — Z853 Personal history of malignant neoplasm of breast: Secondary | ICD-10-CM

## 2021-11-06 DIAGNOSIS — Z1231 Encounter for screening mammogram for malignant neoplasm of breast: Secondary | ICD-10-CM | POA: Diagnosis not present

## 2021-11-11 ENCOUNTER — Other Ambulatory Visit: Payer: Self-pay | Admitting: *Deleted

## 2021-11-11 DIAGNOSIS — D649 Anemia, unspecified: Secondary | ICD-10-CM

## 2021-11-11 DIAGNOSIS — Z170 Estrogen receptor positive status [ER+]: Secondary | ICD-10-CM

## 2021-11-12 ENCOUNTER — Inpatient Hospital Stay: Payer: HMO | Admitting: Hematology and Oncology

## 2021-11-12 ENCOUNTER — Other Ambulatory Visit: Payer: Self-pay

## 2021-11-12 ENCOUNTER — Inpatient Hospital Stay: Payer: HMO | Attending: Hematology and Oncology

## 2021-11-12 ENCOUNTER — Other Ambulatory Visit: Payer: Self-pay | Admitting: *Deleted

## 2021-11-12 VITALS — BP 141/77 | HR 88 | Temp 97.7°F | Resp 16 | Ht 64.0 in | Wt 168.0 lb

## 2021-11-12 DIAGNOSIS — Z85828 Personal history of other malignant neoplasm of skin: Secondary | ICD-10-CM | POA: Insufficient documentation

## 2021-11-12 DIAGNOSIS — Z170 Estrogen receptor positive status [ER+]: Secondary | ICD-10-CM | POA: Diagnosis not present

## 2021-11-12 DIAGNOSIS — Z9221 Personal history of antineoplastic chemotherapy: Secondary | ICD-10-CM | POA: Diagnosis not present

## 2021-11-12 DIAGNOSIS — D509 Iron deficiency anemia, unspecified: Secondary | ICD-10-CM | POA: Insufficient documentation

## 2021-11-12 DIAGNOSIS — Z90710 Acquired absence of both cervix and uterus: Secondary | ICD-10-CM | POA: Insufficient documentation

## 2021-11-12 DIAGNOSIS — M1990 Unspecified osteoarthritis, unspecified site: Secondary | ICD-10-CM | POA: Insufficient documentation

## 2021-11-12 DIAGNOSIS — Z923 Personal history of irradiation: Secondary | ICD-10-CM | POA: Insufficient documentation

## 2021-11-12 DIAGNOSIS — Z853 Personal history of malignant neoplasm of breast: Secondary | ICD-10-CM | POA: Diagnosis not present

## 2021-11-12 DIAGNOSIS — C50412 Malignant neoplasm of upper-outer quadrant of left female breast: Secondary | ICD-10-CM | POA: Diagnosis not present

## 2021-11-12 DIAGNOSIS — D649 Anemia, unspecified: Secondary | ICD-10-CM

## 2021-11-12 DIAGNOSIS — E538 Deficiency of other specified B group vitamins: Secondary | ICD-10-CM | POA: Insufficient documentation

## 2021-11-12 LAB — CMP (CANCER CENTER ONLY)
ALT: 11 U/L (ref 0–44)
AST: 13 U/L — ABNORMAL LOW (ref 15–41)
Albumin: 4.1 g/dL (ref 3.5–5.0)
Alkaline Phosphatase: 122 U/L (ref 38–126)
Anion gap: 5 (ref 5–15)
BUN: 13 mg/dL (ref 8–23)
CO2: 31 mmol/L (ref 22–32)
Calcium: 9.5 mg/dL (ref 8.9–10.3)
Chloride: 103 mmol/L (ref 98–111)
Creatinine: 0.74 mg/dL (ref 0.44–1.00)
GFR, Estimated: >60 mL/min (ref >60)
Glucose, Bld: 157 mg/dL — ABNORMAL HIGH (ref 70–99)
Potassium: 4.4 mmol/L (ref 3.5–5.1)
Sodium: 139 mmol/L (ref 135–145)
Total Bilirubin: 0.4 mg/dL (ref 0.3–1.2)
Total Protein: 7 g/dL (ref 6.5–8.1)

## 2021-11-12 LAB — CBC WITH DIFFERENTIAL (CANCER CENTER ONLY)
Abs Immature Granulocytes: 0.01 10*3/uL (ref 0.00–0.07)
Basophils Absolute: 0 10*3/uL (ref 0.0–0.1)
Basophils Relative: 1 %
Eosinophils Absolute: 0.4 10*3/uL (ref 0.0–0.5)
Eosinophils Relative: 6 %
HCT: 36.2 % (ref 36.0–46.0)
Hemoglobin: 12.1 g/dL (ref 12.0–15.0)
Immature Granulocytes: 0 %
Lymphocytes Relative: 21 %
Lymphs Abs: 1.3 10*3/uL (ref 0.7–4.0)
MCH: 30.9 pg (ref 26.0–34.0)
MCHC: 33.4 g/dL (ref 30.0–36.0)
MCV: 92.3 fL (ref 80.0–100.0)
Monocytes Absolute: 0.6 10*3/uL (ref 0.1–1.0)
Monocytes Relative: 10 %
Neutro Abs: 3.8 10*3/uL (ref 1.7–7.7)
Neutrophils Relative %: 62 %
Platelet Count: 199 10*3/uL (ref 150–400)
RBC: 3.92 MIL/uL (ref 3.87–5.11)
RDW: 13.5 % (ref 11.5–15.5)
WBC Count: 6.1 10*3/uL (ref 4.0–10.5)
nRBC: 0 % (ref 0.0–0.2)

## 2021-11-12 LAB — IRON AND IRON BINDING CAPACITY (CC-WL,HP ONLY)
Iron: 63 ug/dL (ref 28–170)
Saturation Ratios: 22 % (ref 10.4–31.8)
TIBC: 281 ug/dL (ref 250–450)
UIBC: 218 ug/dL (ref 148–442)

## 2021-11-12 LAB — FERRITIN: Ferritin: 98 ng/mL (ref 11–307)

## 2021-11-12 LAB — VITAMIN B12: Vitamin B-12: 406 pg/mL (ref 180–914)

## 2021-11-12 NOTE — Progress Notes (Signed)
Kristine Parrish  [phone] [phone]     ID: Kristine Parrish DOB: December 25, 1945  MR#: [id number]  DJM#:[id number]  Patient Care Team: Orpah Melter, MD as PCP - General (Family Medicine) Jovita Kussmaul, MD as Consulting Physician (General Surgery) Magrinat, Virgie Dad, MD (Inactive) as Consulting Physician (Oncology) Eppie Gibson, MD as Attending Physician (Radiation Oncology) Murvin Donning, MD (Dermatology) Laurence Spates, MD (Inactive) as Consulting Physician (Gastroenterology) Kristine Parrish, DPM as Consulting Physician (Podiatry) Maisie Fus, MD (Inactive) as Consulting Physician (Obstetrics and Gynecology) Larey Dresser, MD as Consulting Physician (Cardiology) Wylene Simmer, MD as Consulting Physician (Orthopedic Surgery) Laurin Coder, MD as Consulting Physician (Pulmonary Disease) Serafina Mitchell, MD as Consulting Physician (Vascular Surgery) OTHER MD:  CHIEF COMPLAINT: Estrogen receptor positive breast cancer/iron and b12 deficiency  CURRENT TREATMENT: Tamoxifen   INTERVAL HISTORY: Kristine Parrish returns today for follow up of her estrogen receptor positive breast cancer. 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 She says this is completely removed.  Mammogram Aug 2023 unremarkable.  REVIEW OF SYSTEMS: Kristine Parrish has significant arthritis and degenerative disease and this is severely limiting.  In addition of course she has the vascular problems.  She is now wearing compression stockings and that is helping.  Her arthritis does not improve with activity and she basically hurts all the time.  A detailed review of systems was otherwise stable.   COVID 19 VACCINATION STATUS: Status Parrish Pfizer x2   BREAST CANCER HISTORY: From the original intake note  Kristine Parrish herself noted a change in her left breast and brought it to the attention of Dr. Nori Parrish, who set her up for bilateral diagnostic mammography with tomography and  left breast ultrasonography at the [facility] 08/09/2015. The breast density was category D. In the area of palpable concern there was a mass with irregular margins and architectural distortion, measuring approximately 3 cm. This was palpable in the upper outer quadrant near the nipple. The ultrasound confirmed an irregular hypoechoic mass approximately 3 cm from the nipple at the 10:00 position measuring 2.7 cm. Ultrasound of the left axilla found a single level I left axillary node with focal cortical thickening.  On 08/13/2015 the patient underwent biopsy of the left breast mass and the suspicious left axillary lymph node, both showing invasive ductal carcinoma, grade 2 or 3, estrogen receptor 100% positive, progesterone receptor 100% positive, both with strong staining intensity, with an MIB-1 of 50%, and HER-2 amplification, the signals ratio being 2.2 to and the number per cell 3.55  The patient's subsequent history is as detailed below   PAST MEDICAL HISTORY: Past Medical History:  Diagnosis Date   Arthritis    "hands "   Breast cancer of upper-outer quadrant of left female breast ([place]) 08/15/2015   Colon polyps    Dyspnea    with exertion- "from chemo"   GERD (gastroesophageal reflux disease)    Hiatal hernia    History of bronchitis    History of radiation therapy 05/19/16- 06/29/16   Left Breast 50 Gy in 25 fractions, SCLV/PAB 45 Gy in 25 fractions, Left Breast boost 10 Gy in 5 fractions.    Hypertension    Neuropathy    from chemopathy   Peptic ulcer disease    Peripheral neuropathy    Personal history of chemotherapy    Personal history of radiation therapy    Type 2 diabetes mellitus ([place])    Type II    PAST  SURGICAL HISTORY: Past Surgical History:  Procedure Laterality Date   APPENDECTOMY  1974   BREAST LUMPECTOMY Left 02/14/2016   cataract surgery Bilateral 04/2012   CHOLECYSTECTOMY  1996   COLONOSCOPY     FOOT SURGERY     left bunion removed   nerve removed  from left foot  2000   PORTACATH PLACEMENT N/A 09/05/2015   Procedure: INSERTION PORT-A-CATH;  Surgeon: Autumn Messing III, MD;  Location: [facility];  Service: General;  Laterality: N/A;   RADIOACTIVE SEED GUIDED PARTIAL MASTECTOMY/AXILLARY SENTINEL NODE BIOPSY/AXILLARY NODE DISSECTION Left 02/14/2016   Procedure: LEFT BREAST RADIOACTIVE SEED GUIDED LUMPECTOMY WITH LEFT  SENTINEL LYMPH NODE BIOPSY AND LEFT  SEED TARGETED DISSECTION;  Surgeon: Autumn Messing III, MD;  Location: [facility];  Service: General;  Laterality: Left;   TUBAL LIGATION     VAGINAL HYSTERECTOMY  1995    FAMILY HISTORY Family History  Problem Relation Age of Onset   Diabetes Mother    Hypertension Mother    Congestive Heart Failure Maternal Grandmother    Alcoholism Maternal Grandfather    Breast cancer Neg Hx   The patient has little information about her father and is not sure of his cause of death or age at death. The patient's mother died at age 51. The patient had no siblings. She was raised by her grandmother. She is not aware of any breast or ovarian cancer history in the family    GYNECOLOGIC HISTORY:  No LMP recorded. Patient has had a hysterectomy.  menarche age 73, first live birth age 88. The patient is GX P2. She underwent hysterectomy with bilateral salpingo-oophorectomy in 1994, and has been on estrogen replacement since that time, discontinued June 2017.    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  She is divorced and currently lives next door to her daughter, Kristine Parrish, who works as a Teacher, music for the [employer]. Son, Kristine Parrish lives in [place] where he is Secondary school teacher. The patient has one granddaughter, 23 years old as of August 2019.  The patient attends [employer]. The patient's daughter Kristine Parrish is her healthcare power of attorney.    HEALTH MAINTENANCE: Social  History   Tobacco Use   Smoking status: Never   Smokeless tobacco: Never  Substance Use Topics   Alcohol use: No   Drug use: Never     Colonoscopy: 2012/[facility]  PAP:  Bone density:  06/24/2016 shows a T score of -0.7 (normal)  Lipid panel:  No Active Allergies  Current Outpatient Medications  Medication Sig Dispense Refill   gabapentin (NEURONTIN) 800 MG tablet Take 800 mg by mouth 3 (three) times daily.     glipiZIDE (GLUCOTROL) 10 MG tablet Take 10 mg by mouth daily before breakfast.     losartan (COZAAR) 25 MG tablet Take 1 tablet (25 mg total) by mouth daily. 30 tablet 6   lovastatin (MEVACOR) 20 MG tablet Take 20 mg by mouth at bedtime.     metFORMIN (GLUCOPHAGE) 500 MG tablet Take 500 mg by mouth 2 (two) times daily with a meal.      Multiple Vitamin (MULTIVITAMIN) tablet Take 1 tablet by mouth daily.     omeprazole (PRILOSEC) 20 MG capsule Take 20 mg by mouth daily.     pioglitazone (ACTOS) 15 MG tablet Take 15 mg by mouth daily.  Probiotic Product (PROBIOTIC DAILY PO) Take 1 tablet by mouth daily. Reported on 08/21/2015     tamoxifen (NOLVADEX) 20 MG tablet Take 1 tablet (20 mg total) by mouth daily. 90 tablet 3   traZODone (DESYREL) 50 MG tablet Take 1 tablet (50 mg total) by mouth at bedtime. 90 tablet 4   No current facility-administered medications for this visit.    OBJECTIVE: White woman who appears stated age 20:   11/12/21 1145  BP: (!) 141/77  Pulse: 88  Resp: 16  Temp: 97.7 F (36.5 C)  SpO2: 96%   Wt Readings from Last 3 Encounters:  11/12/21 168 lb (76.2 kg)  03/12/21 169 lb (76.7 kg)  01/08/21 174 lb 12.8 oz (79.3 kg)   Body mass index is 28.84 kg/m.    ECOG FS:2 - Symptomatic, <50% confined to bed No concerns for recurrence on breast exam. No palpable LN   Right leg on 11/15/2020    LAB RESULTS:  CMP     Component Value Date/Time   NA 142 01/08/2021 1001   NA 140 12/28/2016 1043   K 3.7 01/08/2021 1001   K 4.1 12/28/2016  1043   CL 108 01/08/2021 1001   CO2 25 01/08/2021 1001   CO2 26 12/28/2016 1043   GLUCOSE 132 (H) 01/08/2021 1001   GLUCOSE 173 (H) 12/28/2016 1043   BUN 10 01/08/2021 1001   BUN 16.9 12/28/2016 1043   CREATININE 0.76 01/08/2021 1001   CREATININE 0.9 12/28/2016 1043   CALCIUM 8.7 (L) 01/08/2021 1001   CALCIUM 9.3 12/28/2016 1043   PROT 7.0 01/08/2021 1001   PROT 7.0 12/28/2016 1043   ALBUMIN 3.7 01/08/2021 1001   ALBUMIN 3.6 12/28/2016 1043   AST 10 (L) 01/08/2021 1001   AST 17 12/28/2016 1043   ALT 11 01/08/2021 1001   ALT 17 12/28/2016 1043   ALKPHOS 122 01/08/2021 1001   ALKPHOS 158 (H) 12/28/2016 1043   BILITOT 0.3 01/08/2021 1001   BILITOT 0.36 12/28/2016 1043   GFRNONAA >60 01/08/2021 1001   GFRAA >60 09/21/2019 0931    INo results found for: "SPEP", "UPEP"  Lab Results  Component Value Date   WBC 6.1 11/12/2021   NEUTROABS 3.8 11/12/2021   HGB 12.1 11/12/2021   HCT 36.2 11/12/2021   MCV 92.3 11/12/2021   PLT 199 11/12/2021      Chemistry      Component Value Date/Time   NA 142 01/08/2021 1001   NA 140 12/28/2016 1043   K 3.7 01/08/2021 1001   K 4.1 12/28/2016 1043   CL 108 01/08/2021 1001   CO2 25 01/08/2021 1001   CO2 26 12/28/2016 1043   BUN 10 01/08/2021 1001   BUN 16.9 12/28/2016 1043   CREATININE 0.76 01/08/2021 1001   CREATININE 0.9 12/28/2016 1043      Component Value Date/Time   CALCIUM 8.7 (L) 01/08/2021 1001   CALCIUM 9.3 12/28/2016 1043   ALKPHOS 122 01/08/2021 1001   ALKPHOS 158 (H) 12/28/2016 1043   AST 10 (L) 01/08/2021 1001   AST 17 12/28/2016 1043   ALT 11 01/08/2021 1001   ALT 17 12/28/2016 1043   BILITOT 0.3 01/08/2021 1001   BILITOT 0.36 12/28/2016 1043     No results found for: "LABCA2"  No components found for: "LABCA125"  No results for input(s): "INR" in the last 168 hours.  Urinalysis No results found for: "COLORURINE", "APPEARANCEUR", "LABSPEC", "PHURINE", "GLUCOSEU", "HGBUR", "BILIRUBINUR", "KETONESUR",  "PROTEINUR", "UROBILINOGEN", "NITRITE", "LEUKOCYTESUR"   STUDIES:  MM 3D SCREEN BREAST BILATERAL  Result Date: 11/06/2021 CLINICAL DATA:  Screening. EXAM: DIGITAL SCREENING BILATERAL MAMMOGRAM WITH TOMOSYNTHESIS AND CAD TECHNIQUE: Bilateral screening digital craniocaudal and mediolateral oblique mammograms were obtained. Bilateral screening digital breast tomosynthesis was performed. The images were evaluated with computer-aided detection. COMPARISON:  Previous exam(s). ACR Breast Density Category c: The breast tissue is heterogeneously dense, which may obscure small masses. FINDINGS: There are no findings suspicious for malignancy. IMPRESSION: No mammographic evidence of malignancy. A result letter of this screening mammogram will be mailed directly to the patient. RECOMMENDATION: Screening mammogram in one year. (Code:SM-B-01Y) BI-RADS CATEGORY  1: Negative. Electronically Signed   By: Dorise Bullion III M.D.   On: 11/06/2021 19:14     ELIGIBLE FOR AVAILABLE RESEARCH PROTOCOL: Not eligible for PALLAS because HER-2 positive  ASSESSMENT: 76 y.o.  woman status Parrish left breast upper outer quadrant and left axillary lymph node biopsy 08/13/2015 both positive for an invasive ductal carcinoma, grade 2 or 3, triple positive, with an MIB-1 of 50%  (1) neoadjuvant chemotherapy consisting of carboplatin, docetaxel, trastuzumab and pertuzumab given every 21 days Started 09/11/2015  (a) treatment changed to Abraxane and trastuzumab with cycle 2 because of side effects after cycle 1  (b) Abraxane discontinued after 3d dose (10/17/2015) because of peripheral neuropathy  (c) cyclophosphamide, methotrexate and fluorouracil (CMF) started 11/07/2015, 4 cycles, last dose 01/09/2016   (2) trastuzumab continued to complete a year (last dose September 24, 2016)  (a) final echocardiogram 07/23/2016 showed a well-preserved ejection fraction at 55-60%  (3) status Parrish left lumpectomy with targeted axillary  dissection 02/14/2016 showing a residual pT1c pN1 invasive ductal carcinoma, grade 2, estrogen and progesterone receptor positive, now HER-2 not amplified; margins were negative  (a) left axillary seroma/abscess drained surgically 03/25/2016, but persists  (4) adjuvant radiation 05/19/16 - 06/29/16  Site/dose:    Left breast - 4 field             Left breast: 50 Gy in 25 fractions             SCLV/PAB: 45 Gy in 25 fractions Left breast boost: 10 GY in 5 fractions  (5) anastrozole started 07/07/2016 changed to tamoxifen due to consideration regarding arthralgias and myalgias  (a) bone density 06/24/2016 shows a T score of -0.7 (normal)  (b) density August 2021 shows a T score of -0.9 (normal)  (c) switched to tamoxifen June 2020  (6) Anemia related to decreased iron saturation and b12 deficiency  (a) Feraheme to be given x 2 starting on 08/04/2019  (b) b12 injections weekly x4 (interrupted slightly due to travel plans), then monthly   PLAN:  Patient is here for follow up She has now completed 5 yrs of anti estrogen therapy. No concerns for breast cancer recurrence. We will try to obtain melanoma records, she also has a follow up with dermatology. No evidence of anemia on our labs from this visit. B12 and ferritin levels are normal She wants to return yearly for surveillance. Most recent mammogram no suspicion for malignancy.  Total time spent: 30 minutes. *Total Encounter Time as defined by the Centers for Medicare and Medicaid Services includes, in addition to the face-to-face time of a patient visit (documented in the note above) non-face-to-face time: obtaining and reviewing outside history, ordering and reviewing medications, tests or procedures, care coordination (communications with other health care professionals or caregivers) and documentation in the medical record.

## 2021-11-14 ENCOUNTER — Encounter: Payer: Self-pay | Admitting: Hematology and Oncology

## 2021-11-14 ENCOUNTER — Encounter: Payer: Self-pay | Admitting: Adult Health

## 2021-12-02 ENCOUNTER — Encounter: Payer: Self-pay | Admitting: Hematology and Oncology

## 2021-12-18 ENCOUNTER — Encounter: Payer: Self-pay | Admitting: Vascular Surgery

## 2021-12-18 ENCOUNTER — Ambulatory Visit: Payer: HMO | Admitting: Vascular Surgery

## 2021-12-18 VITALS — BP 122/81 | HR 96 | Temp 97.9°F | Resp 20 | Ht 64.0 in | Wt 163.0 lb

## 2021-12-18 DIAGNOSIS — I872 Venous insufficiency (chronic) (peripheral): Secondary | ICD-10-CM

## 2021-12-18 DIAGNOSIS — G629 Polyneuropathy, unspecified: Secondary | ICD-10-CM | POA: Diagnosis not present

## 2021-12-18 DIAGNOSIS — I890 Lymphedema, not elsewhere classified: Secondary | ICD-10-CM | POA: Diagnosis not present

## 2021-12-18 DIAGNOSIS — E7800 Pure hypercholesterolemia, unspecified: Secondary | ICD-10-CM | POA: Diagnosis not present

## 2021-12-18 DIAGNOSIS — E1149 Type 2 diabetes mellitus with other diabetic neurological complication: Secondary | ICD-10-CM | POA: Diagnosis not present

## 2021-12-18 DIAGNOSIS — Z7984 Long term (current) use of oral hypoglycemic drugs: Secondary | ICD-10-CM | POA: Diagnosis not present

## 2021-12-18 DIAGNOSIS — I10 Essential (primary) hypertension: Secondary | ICD-10-CM | POA: Diagnosis not present

## 2021-12-18 NOTE — Progress Notes (Signed)
REASON FOR VISIT:   Follow-up of chronic venous insufficiency  MEDICAL ISSUES:   CHRONIC VENOUS INSUFFICIENCY: I think that the swelling in her right leg has improved significantly since I saw her 6 months ago.  Therefore I do not think a CT is necessary at this time.  We have again discussed the importance of leg elevation.  She will continue to wear her compression stockings.  She had not been wearing them much during the summer.  I encouraged her to exercise.  I would like to repeat her venous reflux testing on the right in 6 months and I will see her back at that time.  She knows to call sooner if she has problems.   HPI:   Kristine Parrish is a pleasant 76 y.o. female who I saw last on 03/12/2021 with chronic venous insufficiency.  She has significant right lower extremity swelling from the knees down bilaterally.  She had 2 venous duplex scans which showed no evidence of DVT.  Her reflux study showed some deep venous reflux in the common femoral vein and some reflux in the right great saphenous vein although the vein was not dilated.  I do not think addressing her superficial venous disease would significantly improve her swelling.  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  She had CEAP C4b venous disease.  We discussed importance of intermittent leg elevation and compression therapy.  I felt that the swelling did not improve we would consider a CT venogram of the abdomen and pelvis to rule out anything that might be obstructing the venous or lymphatic system on the right.  The only other consideration will be a pneumatic compression device.  Since I saw her last she tells me that the swelling is about the same.  She has not been wearing her compression stockings much as it was hot during the summer.  She does have some back issues and she sleeps sitting up.  She denies any chest pain or shortness of breath.   Past Medical History:   Diagnosis Date   Arthritis    "hands "   Breast cancer of upper-outer quadrant of left female breast ([place]) 08/15/2015   Colon polyps    Dyspnea    with exertion- "from chemo"   GERD (gastroesophageal reflux disease)    Hiatal hernia    History of bronchitis    History of radiation therapy 05/19/16- 06/29/16   Left Breast 50 Gy in 25 fractions, SCLV/PAB 45 Gy in 25 fractions, Left Breast boost 10 Gy in 5 fractions.    Hypertension    Neuropathy    from chemopathy   Peptic ulcer disease    Peripheral neuropathy    Personal history of chemotherapy    Personal history of radiation therapy    Type 2 diabetes mellitus ([place])    Type II    Family History  Problem Relation Age of Onset   Diabetes Mother    Hypertension Mother    Congestive Heart Failure Maternal Grandmother    Alcoholism Maternal Grandfather    Breast cancer Neg Hx     SOCIAL HISTORY: Social History   Tobacco Use   Smoking status: Never   Smokeless tobacco: Never  Substance Use Topics   Alcohol use: No    No Known Allergies  Current Outpatient Medications  Medication Sig Dispense Refill   gabapentin (NEURONTIN) 800 MG tablet Take 800 mg by mouth 3 (three) times daily.  glipiZIDE (GLUCOTROL) 10 MG tablet Take 10 mg by mouth daily before breakfast.     lovastatin (MEVACOR) 20 MG tablet Take 20 mg by mouth at bedtime.     metFORMIN (GLUCOPHAGE) 500 MG tablet Take 500 mg by mouth 2 (two) times daily with a meal.      Multiple Vitamin (MULTIVITAMIN) tablet Take 1 tablet by mouth daily.     omeprazole (PRILOSEC) 20 MG capsule Take 20 mg by mouth daily.     pioglitazone (ACTOS) 15 MG tablet Take 15 mg by mouth daily.     Probiotic Product (PROBIOTIC DAILY PO) Take 1 tablet by mouth daily. Reported on 08/21/2015     traMADol (ULTRAM) 50 MG tablet Take 50 mg by mouth 3 (three) times daily as needed.     losartan (COZAAR) 25 MG tablet Take 1 tablet (25 mg total) by mouth daily. 30 tablet 6   No current  facility-administered medications for this visit.    REVIEW OF SYSTEMS:  '[X]'$  denotes positive finding, '[ ]'$  denotes negative finding Cardiac  Comments:  Chest pain or chest pressure:    Shortness of breath upon exertion:    Short of breath when lying flat:    Irregular heart rhythm:        Vascular    Pain in calf, thigh, or hip brought on by ambulation:    Pain in feet at night that wakes you up from your sleep:  x   Blood clot in your veins:    Leg swelling:  x       Pulmonary    Oxygen at home:    Productive cough:     Wheezing:         Neurologic    Sudden weakness in arms or legs:     Sudden numbness in arms or legs:     Sudden onset of difficulty speaking or slurred speech:    Temporary loss of vision in one eye:     Problems with dizziness:         Gastrointestinal    Blood in stool:     Vomited blood:         Genitourinary    Burning when urinating:     Blood in urine:        Psychiatric    Major depression:         Hematologic    Bleeding problems:    Problems with blood clotting too easily:        Skin    Rashes or ulcers:        Constitutional    Fever or chills:     PHYSICAL EXAM:   Vitals:   12/18/21 1338  BP: 122/81  Pulse: 96  Resp: 20  Temp: 97.9 F (36.6 C)  SpO2: 93%  Weight: 163 lb (73.9 kg)  Height: '5\' 4"'$  (1.626 m)   GENERAL: The patient is a well-nourished female, in no acute distress. The vital signs are documented above. CARDIAC: There is a regular rate and rhythm.  VASCULAR: I do not detect carotid bruits. Both feet are warm and well-perfused. She has bilateral lower extremity swelling which is more significant on the right side. PULMONARY: There is good air exchange bilaterally without wheezing or rales. ABDOMEN: Soft and non-tender with normal pitched bowel sounds.  MUSCULOSKELETAL: There are no major deformities or cyanosis. NEUROLOGIC: No focal weakness or paresthesias are detected. SKIN: There are no ulcers or rashes  noted. PSYCHIATRIC: The patient has a  normal affect.  DATA:    No new data.  Deitra [facility] [phone]

## 2021-12-19 ENCOUNTER — Other Ambulatory Visit: Payer: Self-pay

## 2021-12-19 DIAGNOSIS — R600 Localized edema: Secondary | ICD-10-CM

## 2021-12-19 DIAGNOSIS — R2241 Localized swelling, mass and lump, right lower limb: Secondary | ICD-10-CM

## 2021-12-19 DIAGNOSIS — I872 Venous insufficiency (chronic) (peripheral): Secondary | ICD-10-CM

## 2021-12-24 DIAGNOSIS — M2040 Other hammer toe(s) (acquired), unspecified foot: Secondary | ICD-10-CM | POA: Insufficient documentation

## 2022-03-19 DIAGNOSIS — L821 Other seborrheic keratosis: Secondary | ICD-10-CM | POA: Diagnosis not present

## 2022-03-19 DIAGNOSIS — Z85820 Personal history of malignant melanoma of skin: Secondary | ICD-10-CM | POA: Diagnosis not present

## 2022-03-19 DIAGNOSIS — Z08 Encounter for follow-up examination after completed treatment for malignant neoplasm: Secondary | ICD-10-CM | POA: Diagnosis not present

## 2022-03-19 DIAGNOSIS — R197 Diarrhea, unspecified: Secondary | ICD-10-CM | POA: Diagnosis not present

## 2022-03-19 DIAGNOSIS — D1801 Hemangioma of skin and subcutaneous tissue: Secondary | ICD-10-CM | POA: Diagnosis not present

## 2022-03-19 DIAGNOSIS — Z86010 Personal history of colonic polyps: Secondary | ICD-10-CM | POA: Diagnosis not present

## 2022-03-19 DIAGNOSIS — L910 Hypertrophic scar: Secondary | ICD-10-CM | POA: Diagnosis not present

## 2022-03-19 DIAGNOSIS — L814 Other melanin hyperpigmentation: Secondary | ICD-10-CM | POA: Diagnosis not present

## 2022-03-24 DIAGNOSIS — R197 Diarrhea, unspecified: Secondary | ICD-10-CM | POA: Diagnosis not present

## 2022-04-08 DIAGNOSIS — M25562 Pain in left knee: Secondary | ICD-10-CM | POA: Diagnosis not present

## 2022-04-08 DIAGNOSIS — M1712 Unilateral primary osteoarthritis, left knee: Secondary | ICD-10-CM | POA: Diagnosis not present

## 2022-04-15 DIAGNOSIS — D124 Benign neoplasm of descending colon: Secondary | ICD-10-CM | POA: Diagnosis not present

## 2022-04-15 DIAGNOSIS — Z86010 Personal history of colonic polyps: Secondary | ICD-10-CM | POA: Diagnosis not present

## 2022-04-15 DIAGNOSIS — D125 Benign neoplasm of sigmoid colon: Secondary | ICD-10-CM | POA: Diagnosis not present

## 2022-04-15 DIAGNOSIS — R197 Diarrhea, unspecified: Secondary | ICD-10-CM | POA: Diagnosis not present

## 2022-04-15 DIAGNOSIS — K644 Residual hemorrhoidal skin tags: Secondary | ICD-10-CM | POA: Diagnosis not present

## 2022-04-15 DIAGNOSIS — Z09 Encounter for follow-up examination after completed treatment for conditions other than malignant neoplasm: Secondary | ICD-10-CM | POA: Diagnosis not present

## 2022-04-15 DIAGNOSIS — K648 Other hemorrhoids: Secondary | ICD-10-CM | POA: Diagnosis not present

## 2022-04-15 DIAGNOSIS — K5730 Diverticulosis of large intestine without perforation or abscess without bleeding: Secondary | ICD-10-CM | POA: Diagnosis not present

## 2022-04-17 DIAGNOSIS — D125 Benign neoplasm of sigmoid colon: Secondary | ICD-10-CM | POA: Diagnosis not present

## 2022-04-17 DIAGNOSIS — D124 Benign neoplasm of descending colon: Secondary | ICD-10-CM | POA: Diagnosis not present

## 2022-05-13 DIAGNOSIS — H524 Presbyopia: Secondary | ICD-10-CM | POA: Diagnosis not present

## 2022-05-13 DIAGNOSIS — H5213 Myopia, bilateral: Secondary | ICD-10-CM | POA: Diagnosis not present

## 2022-05-13 DIAGNOSIS — H353131 Nonexudative age-related macular degeneration, bilateral, early dry stage: Secondary | ICD-10-CM | POA: Diagnosis not present

## 2022-05-13 DIAGNOSIS — E119 Type 2 diabetes mellitus without complications: Secondary | ICD-10-CM | POA: Diagnosis not present

## 2022-05-13 DIAGNOSIS — H18593 Other hereditary corneal dystrophies, bilateral: Secondary | ICD-10-CM | POA: Diagnosis not present

## 2022-05-14 DIAGNOSIS — Z86010 Personal history of colonic polyps: Secondary | ICD-10-CM | POA: Diagnosis not present

## 2022-05-14 DIAGNOSIS — K8681 Exocrine pancreatic insufficiency: Secondary | ICD-10-CM | POA: Diagnosis not present

## 2022-05-14 DIAGNOSIS — R5383 Other fatigue: Secondary | ICD-10-CM | POA: Diagnosis not present

## 2022-05-14 DIAGNOSIS — I10 Essential (primary) hypertension: Secondary | ICD-10-CM | POA: Diagnosis not present

## 2022-05-14 DIAGNOSIS — E785 Hyperlipidemia, unspecified: Secondary | ICD-10-CM | POA: Diagnosis not present

## 2022-05-20 DIAGNOSIS — M2392 Unspecified internal derangement of left knee: Secondary | ICD-10-CM | POA: Diagnosis not present

## 2022-05-30 DIAGNOSIS — M25562 Pain in left knee: Secondary | ICD-10-CM | POA: Diagnosis not present

## 2022-05-31 DIAGNOSIS — M2392 Unspecified internal derangement of left knee: Secondary | ICD-10-CM | POA: Insufficient documentation

## 2022-06-02 DIAGNOSIS — M5451 Vertebrogenic low back pain: Secondary | ICD-10-CM | POA: Diagnosis not present

## 2022-06-02 DIAGNOSIS — M5416 Radiculopathy, lumbar region: Secondary | ICD-10-CM | POA: Diagnosis not present

## 2022-06-05 DIAGNOSIS — M1712 Unilateral primary osteoarthritis, left knee: Secondary | ICD-10-CM | POA: Diagnosis not present

## 2022-06-18 DIAGNOSIS — I10 Essential (primary) hypertension: Secondary | ICD-10-CM | POA: Diagnosis not present

## 2022-06-18 DIAGNOSIS — E1142 Type 2 diabetes mellitus with diabetic polyneuropathy: Secondary | ICD-10-CM | POA: Diagnosis not present

## 2022-06-18 DIAGNOSIS — E7800 Pure hypercholesterolemia, unspecified: Secondary | ICD-10-CM | POA: Diagnosis not present

## 2022-06-18 DIAGNOSIS — G620 Drug-induced polyneuropathy: Secondary | ICD-10-CM | POA: Diagnosis not present

## 2022-06-18 DIAGNOSIS — Z853 Personal history of malignant neoplasm of breast: Secondary | ICD-10-CM | POA: Diagnosis not present

## 2022-06-18 DIAGNOSIS — E11610 Type 2 diabetes mellitus with diabetic neuropathic arthropathy: Secondary | ICD-10-CM | POA: Diagnosis not present

## 2022-06-18 DIAGNOSIS — E11621 Type 2 diabetes mellitus with foot ulcer: Secondary | ICD-10-CM | POA: Diagnosis not present

## 2022-06-18 DIAGNOSIS — L97522 Non-pressure chronic ulcer of other part of left foot with fat layer exposed: Secondary | ICD-10-CM | POA: Diagnosis not present

## 2022-06-18 DIAGNOSIS — E1149 Type 2 diabetes mellitus with other diabetic neurological complication: Secondary | ICD-10-CM | POA: Diagnosis not present

## 2022-06-19 ENCOUNTER — Encounter: Payer: Self-pay | Admitting: Adult Health

## 2022-06-23 ENCOUNTER — Ambulatory Visit (INDEPENDENT_AMBULATORY_CARE_PROVIDER_SITE_OTHER): Payer: PPO

## 2022-06-23 ENCOUNTER — Encounter: Payer: Self-pay | Admitting: Podiatry

## 2022-06-23 ENCOUNTER — Ambulatory Visit (INDEPENDENT_AMBULATORY_CARE_PROVIDER_SITE_OTHER): Payer: PPO | Admitting: Podiatry

## 2022-06-23 DIAGNOSIS — L97522 Non-pressure chronic ulcer of other part of left foot with fat layer exposed: Secondary | ICD-10-CM | POA: Diagnosis not present

## 2022-06-23 DIAGNOSIS — E1142 Type 2 diabetes mellitus with diabetic polyneuropathy: Secondary | ICD-10-CM

## 2022-06-23 DIAGNOSIS — M2142 Flat foot [pes planus] (acquired), left foot: Secondary | ICD-10-CM | POA: Diagnosis not present

## 2022-06-23 DIAGNOSIS — E11621 Type 2 diabetes mellitus with foot ulcer: Secondary | ICD-10-CM | POA: Diagnosis not present

## 2022-06-23 DIAGNOSIS — M2141 Flat foot [pes planus] (acquired), right foot: Secondary | ICD-10-CM

## 2022-06-23 MED ORDER — MUPIROCIN 2 % EX OINT: 1.0000 | TOPICAL_OINTMENT | Freq: Every day | CUTANEOUS | 2 refills | Status: AC

## 2022-06-24 ENCOUNTER — Encounter: Payer: Self-pay | Admitting: Podiatry

## 2022-06-24 NOTE — Progress Notes (Signed)
  Subjective:  Patient ID: Kristine Parrish, female    DOB: Oct 04, 1945,  MRN: [id number]  Chief Complaint  Patient presents with   Diabetic Ulcer    *Urgent* new pt-ulcer of L 3rd toe in diabetes (2 months)-non req-dr. Joycelyn Rua refer    77 y.o. female presents with the above complaint. History confirmed with patient.  She has a history of right foot Charcot as well as right foot forefoot reconstruction, has not had any recent surgery on the left foot.  Currently on antibiotics Objective:  Physical Exam: warm, good capillary refill, no trophic changes or ulcerative lesions, normal DP and PT pulses, and she has an abnormal sensory exam with diffuse neuropathy, bilateral pes planus deformity with collapse of the medial longitudinal arch, on the left foot she has reducible hammertoe deformities with full-thickness ulceration on the distal tip of the third toe measuring 1.3 cm x 1.2 cm x 0.3 cm.  Granular wound bed.  Serous drainage.  Hypertrophic nail.  Hypertrophic surrounding hyperkeratosis.  Does not probe deep to bone.  No purulence malodor or cellulitis..     Radiographs: Multiple views x-ray of the left foot: no soft tissue emphysema, no signs of osteomyelitis and significant flaps of medial weightbearing longitudinal arch with significant midfoot degenerative changes Assessment:   1. Diabetic ulcer of toe of left foot associated with type 2 diabetes mellitus, with fat layer exposed      Plan:  Patient was evaluated and treated and all questions answered.  Ulcer left foot -We discussed the etiology and factors that are a part of the wound healing process.  We also discussed the risk of infection both soft tissue and osteomyelitis from open ulceration.  Discussed the risk of limb loss if this happens or worsens. -Debridement as below. -Dressed with Iodosorb, DSD. -Continue home dressing changes daily with 4 x 4 gauze and mupirocin ointment, Rx sent to pharmacy -Continue  off-loading with surgical shoe.  She has this at home and will utilize this -Vascular testing pulses are palpable -Last antibiotics: Currently on antibiotics and she will complete this -Imaging: x-ray reviewed, shows no signs of erosions, osteolysis, osteomyelitis or emphysema.  Procedure: Excisional Debridement of Wound Rationale: Removal of non-viable soft tissue from the wound to promote healing.  Anesthesia: none Post-Debridement Wound Measurements: Noted above Type of Debridement: Sharp Excisional Tissue Removed: Non-viable soft tissue Depth of Debridement: subcutaneous tissue. Technique: Sharp excisional debridement to bleeding, viable wound base.  Dressing: Dry, sterile, compression dressing. Disposition: Patient tolerated procedure well.    Return in about 4 weeks (around 07/21/2022) for wound care.

## 2022-07-23 ENCOUNTER — Encounter: Payer: Self-pay | Admitting: Adult Health

## 2022-07-23 ENCOUNTER — Ambulatory Visit (INDEPENDENT_AMBULATORY_CARE_PROVIDER_SITE_OTHER): Payer: PPO | Admitting: Podiatry

## 2022-07-23 DIAGNOSIS — E11621 Type 2 diabetes mellitus with foot ulcer: Secondary | ICD-10-CM | POA: Diagnosis not present

## 2022-07-23 DIAGNOSIS — L97522 Non-pressure chronic ulcer of other part of left foot with fat layer exposed: Secondary | ICD-10-CM | POA: Diagnosis not present

## 2022-07-23 MED ORDER — CEPHALEXIN 500 MG PO CAPS: 500.0000 mg | ORAL_CAPSULE | Freq: Three times a day (TID) | ORAL | 0 refills | Status: AC

## 2022-07-27 NOTE — Progress Notes (Signed)
  Subjective:  Patient ID: KANSAS SCREEN, female    DOB: June 28, 1945,  MRN: [id number]  Chief Complaint  Patient presents with   Diabetic Ulcer    4week follow up, wound care left foot    77 y.o. female presents with the above complaint. History confirmed with patient.  She was doing very well the wound had reduced quite a bit in size finished her antibiotics until she had traveled yesterday and had a flight that was delayed and so was on her feet quite a lot, the toe swelled up and has blistered  Objective:  Physical Exam: warm, good capillary refill, no trophic changes or ulcerative lesions, normal DP and PT pulses, and she has an abnormal sensory exam with diffuse neuropathy, bilateral pes planus deformity with collapse of the medial longitudinal arch, on the left foot she has reducible hammertoe deformities with full-thickness ulceration on the distal tip of the third toe measuring 0.3 cm x 0.2 cm x 0.3 cm with significant improvement, however the epidermis of the distal pulp has blistered and has a bleeding beneath, no expansion of wound due to this or removal of the nail needed.  Granular wound bed.  Serous drainage.  Hypertrophic nail.  Hypertrophic surrounding hyperkeratosis.  Does not probe deep to bone.  No purulence malodor or cellulitis..     Radiographs: Multiple views x-ray of the left foot: no soft tissue emphysema, no signs of osteomyelitis and significant flaps of medial weightbearing longitudinal arch with significant midfoot degenerative changes Assessment:   1. Diabetic ulcer of toe of left foot associated with type 2 diabetes mellitus, with fat layer exposed (HCC)      Plan:  Patient was evaluated and treated and all questions answered.  Ulcer left foot -Ulceration improved quite a bit itself, however she did have new epidermolysis due to blistering.  I debrided the overlying blister and remove the skin to expose to air she will apply Betadine.  I did place her back  on Keflex as a precaution, she will continue to utilize mupirocin ointment at home. -Debridement as below. -Dressed with Iodosorb, DSD. -Continue home dressing changes daily with 4 x 4 gauze and mupirocin ointment, Rx sent to pharmacy -Continue off-loading with surgical shoe.  She has this at home and will utilize this -Vascular testing pulses are palpable -Last antibiotics: Currently on antibiotics and she will complete this -Imaging: x-ray reviewed, shows no signs of erosions, osteolysis, osteomyelitis or emphysema.  Procedure: Excisional Debridement of Wound Rationale: Removal of non-viable soft tissue from the wound to promote healing.  Anesthesia: none Post-Debridement Wound Measurements: Noted above Type of Debridement: Sharp Excisional Tissue Removed: Non-viable soft tissue Depth of Debridement: subcutaneous tissue. Technique: Sharp excisional debridement to bleeding, viable wound base.  Dressing: Dry, sterile, compression dressing. Disposition: Patient tolerated procedure well.    Return in about 3 weeks (around 08/13/2022) for wound care.

## 2022-07-29 ENCOUNTER — Encounter: Payer: Self-pay | Admitting: Vascular Surgery

## 2022-07-29 ENCOUNTER — Encounter: Payer: Self-pay | Admitting: Adult Health

## 2022-07-29 ENCOUNTER — Ambulatory Visit: Payer: PPO | Admitting: Vascular Surgery

## 2022-07-29 ENCOUNTER — Ambulatory Visit (HOSPITAL_COMMUNITY)
Admission: RE | Admit: 2022-07-29 | Discharge: 2022-07-29 | Disposition: A | Discharge status: Home / Self Care | Payer: PPO | Source: Ambulatory Visit | Attending: Vascular Surgery | Admitting: Vascular Surgery

## 2022-07-29 VITALS — BP 149/85 | HR 101 | Temp 98.0°F | Resp 16 | Ht 62.0 in | Wt 169.0 lb

## 2022-07-29 DIAGNOSIS — R600 Localized edema: Secondary | ICD-10-CM

## 2022-07-29 DIAGNOSIS — R2241 Localized swelling, mass and lump, right lower limb: Secondary | ICD-10-CM | POA: Insufficient documentation

## 2022-07-29 DIAGNOSIS — I872 Venous insufficiency (chronic) (peripheral): Secondary | ICD-10-CM | POA: Insufficient documentation

## 2022-07-29 DIAGNOSIS — M1712 Unilateral primary osteoarthritis, left knee: Secondary | ICD-10-CM | POA: Diagnosis not present

## 2022-07-29 NOTE — Progress Notes (Signed)
REASON FOR VISIT:   Follow-up of venous insufficiency  MEDICAL ISSUES:   CHRONIC VENOUS INSUFFICIENCY: This patient's leg swelling has improved significantly since I saw her last.  She has been elevating her legs more.  Her activity is somewhat limited by her back issues but she does try to stay active.  She is also been wearing her compression stockings.  Currently she is not having any significant symptoms associated with her legs.  Although her venous reflux in the right great saphenous vein has progressed some and the vein is dilated I would not recommend laser ablation unless she developed recurrent swelling or worsening symptoms.  We have again discussed the importance of leg elevation, compression therapy, and exercise.  She may be a good candidate for water aerobics given her back issues.  I encouraged her to avoid prolonged sitting and standing.  She will call if her symptoms progress or her swelling progresses.   HPI:   Kristine Parrish is a pleasant 77 y.o. female who I last saw on 12/18/2021.  I have been following her with chronic venous insufficiency.  She has significant right lower extremity swelling from the knees down bilaterally.  She had 2 previous venous duplex scans which showed no evidence of DVT.  She had a reflux study which showed some deep venous reflux in the common femoral vein and some reflux in the right great saphenous vein although the vein was not dilated.  I did not think that addressing her superficial venous reflux would significantly improve her swelling.  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  We discussed conservative measures including leg elevation and compression therapy.  I wanted to repeat her reflux study at this time.  Since I saw her last, her leg swelling has improved significantly.  She denies any significant aching pain or heaviness in her legs.  She has been elevating her legs more at night.   She stays fairly active working in the garden and working in her yard.  Her activity is otherwise limited by her back issues.  Past Medical History:  Diagnosis Date   Arthritis    "hands "   Breast cancer of upper-outer quadrant of left female breast ([facility]) 08/15/2015   Colon polyps    Dyspnea    with exertion- "from chemo"   GERD (gastroesophageal reflux disease)    Hiatal hernia    History of bronchitis    History of radiation therapy 05/19/16- 06/29/16   Left Breast 50 Gy in 25 fractions, SCLV/PAB 45 Gy in 25 fractions, Left Breast boost 10 Gy in 5 fractions.    Hypertension    Neuropathy    from chemopathy   Peptic ulcer disease    Peripheral neuropathy    Personal history of chemotherapy    Personal history of radiation therapy    Type 2 diabetes mellitus ([facility])    Type II    Family History  Problem Relation Age of Onset   Diabetes Mother    Hypertension Mother    Congestive Heart Failure Maternal Grandmother    Alcoholism Maternal Grandfather    Breast cancer Neg Hx     SOCIAL HISTORY: Social History   Tobacco Use   Smoking status: Never   Smokeless tobacco: Never  Substance Use Topics   Alcohol use: No    No Known Allergies  Current Outpatient Medications  Medication Sig Dispense Refill   gabapentin (NEURONTIN) 800 MG tablet Take 800  mg by mouth 3 (three) times daily.     glipiZIDE (GLUCOTROL) 10 MG tablet Take 10 mg by mouth daily before breakfast.     lipase/protease/amylase (CREON) 12000-38000 units CPEP capsule      lovastatin (MEVACOR) 20 MG tablet Take 20 mg by mouth at bedtime.     metFORMIN (GLUCOPHAGE) 500 MG tablet Take 500 mg by mouth 2 (two) times daily with a meal.      Multiple Vitamin (MULTIVITAMIN) tablet Take 1 tablet by mouth daily.     mupirocin ointment (BACTROBAN) 2 % Apply 1 Application topically daily. 30 g 2   omeprazole (PRILOSEC) 20 MG capsule Take 20 mg by mouth daily.     phenazopyridine (PYRIDIUM) 200 MG tablet phenazopyridine 200  mg tablet  TAKE ONE TABLET BY MOUTH EVERY 8 HOURS AS NEEDED     pioglitazone (ACTOS) 15 MG tablet Take 15 mg by mouth daily.     Probiotic Product (PROBIOTIC DAILY PO) Take 1 tablet by mouth daily. Reported on 08/21/2015     traMADol (ULTRAM) 50 MG tablet Take 50 mg by mouth 3 (three) times daily as needed.     anastrozole (ARIMIDEX) 1 MG tablet anastrozole 1 mg tablet  TAKE ONE TABLET BY MOUTH DAILY     chlorhexidine (PERIDEX) 0.12 % solution chlorhexidine gluconate 0.12 % mouthwash  RINSE MOUTH WITH (1 CAPFUL) FOR 30 SECONDS IN MORNING AND EVENING AFTER BRUSHING, THEN SPIT     estradiol (ESTRACE) 0.1 MG/GM vaginal cream estradiol 0.01% (0.1 mg/gram) vaginal cream  APPLY pea-sized AMOUNT around THE urethra & vaginal opening ONCE DAILY for two weeks, THEN USE EVERY OTHER DAY     losartan (COZAAR) 25 MG tablet Take 1 tablet (25 mg total) by mouth daily. 30 tablet 6   No current facility-administered medications for this visit.    REVIEW OF SYSTEMS:  [X]  denotes positive finding, [ ]  denotes negative finding Cardiac  Comments:  Chest pain or chest pressure:    Shortness of breath upon exertion:    Short of breath when lying flat:    Irregular heart rhythm:        Vascular    Pain in calf, thigh, or hip brought on by ambulation:    Pain in feet at night that wakes you up from your sleep:     Blood clot in your veins:    Leg swelling:         Pulmonary    Oxygen at home:    Productive cough:     Wheezing:         Neurologic    Sudden weakness in arms or legs:     Sudden numbness in arms or legs:     Sudden onset of difficulty speaking or slurred speech:    Temporary loss of vision in one eye:     Problems with dizziness:         Gastrointestinal    Blood in stool:     Vomited blood:         Genitourinary    Burning when urinating:     Blood in urine:        Psychiatric    Major depression:         Hematologic    Bleeding problems:    Problems with blood clotting  too easily:        Skin    Rashes or ulcers:        Constitutional    Fever or  chills:     PHYSICAL EXAM:   Vitals:   07/29/22 1554  BP: (!) 149/85  Pulse: (!) 101  Resp: 16  Temp: 98 F (36.7 C)  TempSrc: Temporal  SpO2: 99%  Weight: 169 lb (76.7 kg)  Height: 5\' 2"  (1.575 m)    GENERAL: The patient is a well-nourished female, in no acute distress. The vital signs are documented above. CARDIAC: There is a regular rate and rhythm.  VASCULAR: I do not detect carotid bruits. She has palpable pedal pulses. I did look at her right great saphenous vein myself with the SonoSite.  The vein is dilated especially in the upper thigh and she has reflux down to her proximal calf. PULMONARY: There is good air exchange bilaterally without wheezing or rales. ABDOMEN: Soft and non-tender with normal pitched bowel sounds.  MUSCULOSKELETAL: There are no major deformities or cyanosis. NEUROLOGIC: No focal weakness or paresthesias are detected. SKIN: There are no ulcers or rashes noted. PSYCHIATRIC: The patient has a normal affect.  DATA:    VENOUS DUPLEX: I have independently interpreted her venous duplex scan today.  This was of the right lower extremity only.  There is no evidence of DVT.  There is no deep venous reflux.  There is significant superficial venous reflux in the right great saphenous vein from the saphenofemoral junction to the proximal calf.  Diameters of the vein ranged from 4-12 mm.  The results of the study are summarized in the diagram below.    Waverly Ferrari [facility] [phone]

## 2022-08-13 ENCOUNTER — Ambulatory Visit: Payer: PPO | Admitting: Podiatry

## 2022-08-13 ENCOUNTER — Ambulatory Visit (INDEPENDENT_AMBULATORY_CARE_PROVIDER_SITE_OTHER): Payer: PPO

## 2022-08-13 DIAGNOSIS — L97522 Non-pressure chronic ulcer of other part of left foot with fat layer exposed: Secondary | ICD-10-CM

## 2022-08-13 DIAGNOSIS — E11621 Type 2 diabetes mellitus with foot ulcer: Secondary | ICD-10-CM

## 2022-08-17 NOTE — Progress Notes (Signed)
  Subjective:  Patient ID: Kristine Parrish, female    DOB: 11-14-1945,  MRN: [id number]  Chief Complaint  Patient presents with   Diabetic Ulcer    3 week follow up left foot    77 y.o. female presents with the above complaint. History confirmed with patient.  She has been using the ointment for second not of gotten bigger  Objective:  Physical Exam: warm, good capillary refill, no trophic changes or ulcerative lesions, normal DP and PT pulses, and she has an abnormal sensory exam with diffuse neuropathy, bilateral pes planus deformity with collapse of the medial longitudinal arch, on the left foot she has reducible hammertoe deformities with full-thickness ulceration on the distal tip of the third toe measuring 2.5 x 1.5 cm x 0.3 cm   Granular wound bed.  Serous drainage.  Hypertrophic nail.  H Does not probe deep to bone.  No purulence malodor or cellulitis..     Radiographs: Multiple views x-ray of the left foot: New radiographs taken today show no change Assessment:   1. Diabetic ulcer of toe of left foot associated with type 2 diabetes mellitus, with fat layer exposed (HCC)      Plan:  Patient was evaluated and treated and all questions answered.  Ulcer left foot -Ulceration now stabilized.  Postoperative measurements noted above.  Toe crest dispensed to offload the wound. -Debridement as below. -Dressed with Iodosorb, DSD. -Continue home dressing changes daily with 4 x 4 gauze and mupirocin ointment, Rx sent to pharmacy -Continue off-loading with surgical shoe.  She has this at home and will utilize this -Vascular testing pulses are palpable -Last antibiotics: No indication for new oral antibiotics -Imaging: x-ray reviewed, shows no signs of erosions, osteolysis, osteomyelitis or emphysema.  Procedure: Excisional Debridement of Wound Rationale: Removal of non-viable soft tissue from the wound to promote healing.  Anesthesia: none Post-Debridement Wound Measurements:  Noted above Type of Debridement: Sharp Excisional Tissue Removed: Non-viable soft tissue Depth of Debridement: subcutaneous tissue. Technique: Sharp excisional debridement to bleeding, viable wound base.  Dressing: Dry, sterile, compression dressing. Disposition: Patient tolerated procedure well.    Return in about 3 weeks (around 09/03/2022) for wound care.

## 2022-09-04 ENCOUNTER — Ambulatory Visit: Payer: PPO | Admitting: Podiatry

## 2022-09-04 DIAGNOSIS — L97522 Non-pressure chronic ulcer of other part of left foot with fat layer exposed: Secondary | ICD-10-CM

## 2022-09-04 DIAGNOSIS — E0843 Diabetes mellitus due to underlying condition with diabetic autonomic (poly)neuropathy: Secondary | ICD-10-CM

## 2022-09-04 NOTE — Progress Notes (Signed)
Chief Complaint  Patient presents with   Foot Ulcer    Pt is here for 3 week post op on her left foot third toe she says she is feeling fine. Pt states she thinks it is getting better slowly.     Subjective:  77 y.o. female with PMHx of diabetes mellitus presenting today for follow-up evaluation of an ulcer to the left third toe.  Patient states that there has been some improvement.  She is feeling significantly better.  She continues wearing the offloading toe crest pad and has been applying mupirocin ointment daily.   Past Medical History:  Diagnosis Date   Arthritis    "hands "   Breast cancer of upper-outer quadrant of left female breast (HCC) 08/15/2015   Colon polyps    Dyspnea    with exertion- "from chemo"   GERD (gastroesophageal reflux disease)    Hiatal hernia    History of bronchitis    History of radiation therapy 05/19/16- 06/29/16   Left Breast 50 Gy in 25 fractions, SCLV/PAB 45 Gy in 25 fractions, Left Breast boost 10 Gy in 5 fractions.    Hypertension    Neuropathy    from chemopathy   Peptic ulcer disease    Peripheral neuropathy    Personal history of chemotherapy    Personal history of radiation therapy    Type 2 diabetes mellitus (HCC)    Type II    Past Surgical History:  Procedure Laterality Date   APPENDECTOMY  1974   BREAST LUMPECTOMY Left 02/14/2016   cataract surgery Bilateral 04/2012   CHOLECYSTECTOMY  1996   COLONOSCOPY     FOOT SURGERY     left bunion removed   nerve removed from left foot  2000   PORTACATH PLACEMENT N/A 09/05/2015   Procedure: INSERTION PORT-A-CATH;  Surgeon: Chevis Pretty III, MD;  Location: [facility];  Service: General;  Laterality: N/A;   RADIOACTIVE SEED GUIDED PARTIAL MASTECTOMY/AXILLARY SENTINEL NODE BIOPSY/AXILLARY NODE DISSECTION Left 02/14/2016   Procedure: LEFT BREAST RADIOACTIVE SEED GUIDED LUMPECTOMY WITH LEFT  SENTINEL LYMPH NODE BIOPSY AND LEFT  SEED TARGETED DISSECTION;  Surgeon: Chevis Pretty III, MD;  Location: [facility] OR;   Service: General;  Laterality: Left;   TUBAL LIGATION     VAGINAL HYSTERECTOMY  1995   No Known Allergies  Objective/Physical Exam General: The patient is alert and oriented x3 in no acute distress.  Dermatology:  Wound #1 noted to the distal end of the left third digit measuring approximately 1.0 x 1.0 x 0.2 cm (LxWxD).   To the noted ulceration(s), there is no eschar. There is a moderate amount of slough, fibrin, and necrotic tissue noted. Granulation tissue and wound base is red. There is a minimal amount of serosanguineous drainage noted. There is no exposed bone muscle-tendon ligament or joint. There is no malodor. Periwound integrity is intact. Skin is warm, dry and supple bilateral lower extremities.  Vascular: Palpable pedal pulses bilaterally. No edema or erythema noted. Capillary refill within normal limits.  Clinically no concern for vascular compromise  Neurological: Light touch and protective threshold diminished bilaterally.   Musculoskeletal Exam: No pedal deformity.  No prior amputations  Assessment: 1.  Ulcer left third digit secondary to diabetes mellitus 2. diabetes mellitus w/ peripheral neuropathy   Plan of Care:  -Patient was evaluated. -Medically necessary excisional debridement including subcutaneous tissue was performed using a tissue nipper and a chisel blade. Excisional debridement of all the necrotic nonviable tissue down to healthy  bleeding viable tissue was performed with post-debridement measurements same as pre-. -Continue mupirocin 2% ointment daily to the ulcer with a light dressing -Continue offloading toe crest pad underneath the toe to alleviate pressure from the distal tip of the third toe -Return to clinic 3 weeks  Felecia Shelling, DPM [facility]  Dr. Felecia Shelling, DPM    [address] [place], [place] [address]                Office [phone]  Fax [phone]

## 2022-09-17 DIAGNOSIS — Z85820 Personal history of malignant melanoma of skin: Secondary | ICD-10-CM | POA: Diagnosis not present

## 2022-09-17 DIAGNOSIS — L814 Other melanin hyperpigmentation: Secondary | ICD-10-CM | POA: Diagnosis not present

## 2022-09-17 DIAGNOSIS — L728 Other follicular cysts of the skin and subcutaneous tissue: Secondary | ICD-10-CM | POA: Diagnosis not present

## 2022-09-17 DIAGNOSIS — L821 Other seborrheic keratosis: Secondary | ICD-10-CM | POA: Diagnosis not present

## 2022-09-17 DIAGNOSIS — Z08 Encounter for follow-up examination after completed treatment for malignant neoplasm: Secondary | ICD-10-CM | POA: Diagnosis not present

## 2022-09-17 DIAGNOSIS — D225 Melanocytic nevi of trunk: Secondary | ICD-10-CM | POA: Diagnosis not present

## 2022-10-01 DIAGNOSIS — M5416 Radiculopathy, lumbar region: Secondary | ICD-10-CM | POA: Diagnosis not present

## 2022-10-01 DIAGNOSIS — W19XXXA Unspecified fall, initial encounter: Secondary | ICD-10-CM | POA: Diagnosis not present

## 2022-10-01 DIAGNOSIS — M5451 Vertebrogenic low back pain: Secondary | ICD-10-CM | POA: Diagnosis not present

## 2022-10-01 DIAGNOSIS — M25562 Pain in left knee: Secondary | ICD-10-CM | POA: Diagnosis not present

## 2022-10-01 DIAGNOSIS — M545 Low back pain, unspecified: Secondary | ICD-10-CM | POA: Diagnosis not present

## 2022-10-05 ENCOUNTER — Encounter: Payer: Self-pay | Admitting: Podiatry

## 2022-10-05 ENCOUNTER — Ambulatory Visit: Payer: PPO | Admitting: Podiatry

## 2022-10-05 DIAGNOSIS — L97522 Non-pressure chronic ulcer of other part of left foot with fat layer exposed: Secondary | ICD-10-CM

## 2022-10-05 NOTE — Progress Notes (Signed)
  Subjective:  Patient ID: Kristine Parrish, female    DOB: [birth year],  MRN: [id number]  Chief Complaint  Patient presents with   Wound Check    "It's still not healed."    76 y.o. female presents with the above complaint. History confirmed with patient.  She is still using the ointment and bandaging feels that it is improving  Objective:  Physical Exam: warm, good capillary refill, no trophic changes or ulcerative lesions, normal DP and PT pulses, and she has an abnormal sensory exam with diffuse neuropathy, bilateral pes planus deformity with collapse of the medial longitudinal arch, on the left foot she has reducible hammertoe deformities with full-thickness ulceration on the distal tip of the third toe measuring 0.6 x 0.4 x 0.2 cm   Granular wound bed.  Serous drainage.  Hypertrophic nail. Does not probe deep.  No purulence malodor or cellulitis.  Overall much improvement.     Radiographs: Multiple views x-ray of the left foot: New radiographs taken today show no change Assessment:   1. Toe ulcer, left, with fat layer exposed (HCC)      Plan:  Patient was evaluated and treated and all questions answered.  Ulcer left foot -Ulceration now stabilized.  Postoperative measurements noted above.  Toe crest dispensed to offload the wound. -Debridement as below. -Dressed with Iodosorb, DSD. -Continue home dressing changes daily with 4 x 4 gauze and mupirocin ointment -Vascular testing pulses are palpable -Last antibiotics: No indication for new oral antibiotics -Imaging: x-ray reviewed, shows no signs of erosions, osteolysis, osteomyelitis or emphysema.  Procedure: Excisional Debridement of Wound Rationale: Removal of non-viable soft tissue from the wound to promote healing.  Anesthesia: none Post-Debridement Wound Measurements: Noted above Type of Debridement: Sharp Excisional Tissue Removed: Non-viable soft tissue Depth of Debridement: subcutaneous tissue. Technique: Sharp  excisional debridement to bleeding, viable wound base.  Dressing: Dry, sterile, compression dressing. Disposition: Patient tolerated procedure well.    Return in about 3 weeks (around 10/26/2022) for wound care.

## 2022-10-27 ENCOUNTER — Ambulatory Visit: Payer: PPO | Admitting: Podiatry

## 2022-10-27 DIAGNOSIS — L97522 Non-pressure chronic ulcer of other part of left foot with fat layer exposed: Secondary | ICD-10-CM

## 2022-10-27 NOTE — Progress Notes (Signed)
  Subjective:  Patient ID: Kristine Parrish, female    DOB: 01-Jan-1946,  MRN: [id number]  Chief Complaint  Patient presents with   Wound Check    Toe ulcer left foot. Patient has been applying mupirocin ointment to area. She believes it is healing.     77 y.o. female presents with the above complaint. History confirmed with patient.  She is still using the ointment and feels that it is improving  Objective:  Physical Exam: warm, good capillary refill, no trophic changes or ulcerative lesions, normal DP and PT pulses, and she has an abnormal sensory exam with diffuse neuropathy, bilateral pes planus deformity with collapse of the medial longitudinal arch, on the left foot she has reducible hammertoe deformities with full-thickness ulceration on the distal tip of the third toe measuring 0.2 x 0.2 x 0.2 cm   Granular wound bed.  Serous drainage.  Hypertrophic nail. Does not probe deep.  No purulence malodor or cellulitis.  Overall much improvement.     Radiographs: Multiple views x-ray of the left foot: New radiographs taken today show no change Assessment:   1. Toe ulcer, left, with fat layer exposed (HCC)      Plan:  Patient was evaluated and treated and all questions answered.  Ulcer left foot -Ulceration now stabilized.  Postoperative measurements noted above.   -Debridement as below. -Dressed with Iodosorb, DSD. -Continue home dressing changes daily with 4 x 4 gauze and mupirocin ointment -Vascular testing pulses are palpable -Last antibiotics: No indication for new oral antibiotics -Imaging: x-ray reviewed, shows no signs of erosions, osteolysis, osteomyelitis or emphysema. -Nearly fully healed, expect we can allow this to heal on its own, if there is any risk of recurrence or painful callus, could consider flexor tenotomy in the future but should not be necessary at this point.  Procedure: Excisional Debridement of Wound Rationale: Removal of non-viable soft tissue from the  wound to promote healing.  Anesthesia: none Post-Debridement Wound Measurements: Noted above Type of Debridement: Sharp Excisional Tissue Removed: Non-viable soft tissue Depth of Debridement: subcutaneous tissue. Technique: Sharp excisional debridement to bleeding, viable wound base.  Dressing: Dry, sterile, compression dressing. Disposition: Patient tolerated procedure well.    Return in about 4 weeks (around 11/24/2022) for wound check.

## 2022-11-05 DIAGNOSIS — M1712 Unilateral primary osteoarthritis, left knee: Secondary | ICD-10-CM | POA: Diagnosis not present

## 2022-11-10 ENCOUNTER — Ambulatory Visit
Admission: RE | Admit: 2022-11-10 | Discharge: 2022-11-10 | Disposition: A | Discharge status: Home / Self Care | Payer: PPO | Source: Ambulatory Visit | Attending: Hematology and Oncology | Admitting: Hematology and Oncology

## 2022-11-10 DIAGNOSIS — Z1231 Encounter for screening mammogram for malignant neoplasm of breast: Secondary | ICD-10-CM | POA: Diagnosis not present

## 2022-11-10 DIAGNOSIS — Z170 Estrogen receptor positive status [ER+]: Secondary | ICD-10-CM

## 2022-11-13 ENCOUNTER — Encounter: Payer: Self-pay | Admitting: Hematology and Oncology

## 2022-11-13 ENCOUNTER — Inpatient Hospital Stay: Payer: PPO | Attending: Hematology and Oncology | Admitting: Hematology and Oncology

## 2022-11-13 VITALS — BP 154/83 | HR 81 | Temp 98.1°F | Resp 17 | Wt 169.7 lb

## 2022-11-13 DIAGNOSIS — C50412 Malignant neoplasm of upper-outer quadrant of left female breast: Secondary | ICD-10-CM

## 2022-11-13 DIAGNOSIS — Z170 Estrogen receptor positive status [ER+]: Secondary | ICD-10-CM

## 2022-11-13 DIAGNOSIS — Z853 Personal history of malignant neoplasm of breast: Secondary | ICD-10-CM | POA: Insufficient documentation

## 2022-11-13 DIAGNOSIS — M25572 Pain in left ankle and joints of left foot: Secondary | ICD-10-CM | POA: Diagnosis not present

## 2022-11-13 NOTE — Progress Notes (Signed)
[facility]  [phone] [phone]     ID: Kristine Parrish DOB: 04-20-1945  MR#: [id number]  NUU#:[id number]  Patient Care Team: Joycelyn Rua, MD as PCP - General (Family Medicine) Griselda Miner, MD as Consulting Physician (General Surgery) Magrinat, Valentino Hue, MD (Inactive) as Consulting Physician (Oncology) Lonie Peak, MD as Attending Physician (Radiation Oncology) Lucinda Dell, MD (Dermatology) Carman Ching, MD (Inactive) as Consulting Physician (Gastroenterology) Larey Dresser, DPM as Consulting Physician (Podiatry) Freddy Finner, MD (Inactive) as Consulting Physician (Obstetrics and Gynecology) Laurey Morale, MD as Consulting Physician (Cardiology) Toni Arthurs, MD as Consulting Physician (Orthopedic Surgery) Tomma Lightning, MD as Consulting Physician (Pulmonary Disease) Nada Libman, MD as Consulting Physician (Vascular Surgery) OTHER MD:  CHIEF COMPLAINT: Estrogen receptor positive breast cancer/iron and b12 deficiency  CURRENT TREATMENT: Tamoxifen   INTERVAL HISTORY: Kristine Parrish returns today for follow up of her estrogen receptor positive breast cancer. 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 She says this is completely removed. She tells me that she is getting some body scans every 6 months. She saw a Dr Dyane Dustman in July for the melanoma. We dont have any notes for this. Mammogram Sep 2024 unremarkable.  REVIEW OF SYSTEMS: Charrise has significant arthritis and degenerative disease and this is severely limiting.  In addition of course she has the vascular problems.  She is now wearing compression stockings and that is helping.  Her arthritis does not improve with activity and she basically hurts all the time.  A detailed review of systems was otherwise stable.   COVID 19 VACCINATION STATUS: Status post Pfizer x2   BREAST CANCER HISTORY: From the original intake note  Jailani herself noted a  change in her left breast and brought it to the attention of Dr. Jennette Kettle, who set her up for bilateral diagnostic mammography with tomography and [facility]. The breast density was category D. In the area of palpable concern there was a mass with irregular margins and architectural distortion, measuring approximately 3 cm. This was palpable in the upper outer quadrant near the nipple. The ultrasound confirmed an irregular hypoechoic mass approximately 3 cm from the nipple at the 10:00 position measuring 2.7 cm. Ultrasound of the left axilla found a single level I left axillary node with focal cortical thickening.  On 08/13/2015 the patient underwent biopsy of the left breast mass and the suspicious left axillary lymph node, both showing invasive ductal carcinoma, grade 2 or 3, estrogen receptor 100% positive, progesterone receptor 100% positive, both with strong staining intensity, with an MIB-1 of 50%, and HER-2 amplification, the signals ratio being 2.2 to and the number per cell 3.55  The patient's subsequent history is as detailed below   PAST MEDICAL HISTORY: Past Medical History:  Diagnosis Date   Arthritis    "hands "   Breast cancer of upper-outer quadrant of left female breast (HCC) 08/15/2015   Colon polyps    Dyspnea    with exertion- "from chemo"   GERD (gastroesophageal reflux disease)    Hiatal hernia    History of bronchitis    History of radiation therapy 05/19/16- 06/29/16   Left Breast 50 Gy in 25 fractions, SCLV/PAB 45 Gy in 25 fractions, Left Breast boost 10 Gy in 5 fractions.    Hypertension    Neuropathy    from chemopathy   Peptic ulcer disease    Peripheral neuropathy  Personal history of chemotherapy    Personal history of radiation therapy    Type 2 diabetes mellitus (HCC)    Type II    PAST SURGICAL HISTORY: Past Surgical History:  Procedure Laterality Date   APPENDECTOMY  1974   BREAST LUMPECTOMY Left  02/14/2016   cataract surgery Bilateral 04/2012   CHOLECYSTECTOMY  1996   COLONOSCOPY     FOOT SURGERY     left bunion removed   nerve removed from left foot  2000   PORTACATH PLACEMENT N/A 09/05/2015   Procedure: INSERTION PORT-A-CATH;  Surgeon: Chevis Pretty III, MD;  Location: [facility];  Service: General;  Laterality: N/A;   RADIOACTIVE SEED GUIDED PARTIAL MASTECTOMY/AXILLARY SENTINEL NODE BIOPSY/AXILLARY NODE DISSECTION Left 02/14/2016   Procedure: LEFT BREAST RADIOACTIVE SEED GUIDED LUMPECTOMY WITH LEFT  SENTINEL LYMPH NODE BIOPSY AND LEFT  SEED TARGETED DISSECTION;  Surgeon: Chevis Pretty III, MD;  Location: [facility];  Service: General;  Laterality: Left;   TUBAL LIGATION     VAGINAL HYSTERECTOMY  1995    FAMILY HISTORY Family History  Problem Relation Age of Onset   Diabetes Mother    Hypertension Mother    Congestive Heart Failure Maternal Grandmother    Alcoholism Maternal Grandfather    Breast cancer Neg Hx   The patient has little information about her father and is not sure of his cause of death or age at death. The patient's mother died at age 40. The patient had no siblings. She was raised by her grandmother. She is not aware of any breast or ovarian cancer history in the family    GYNECOLOGIC HISTORY:  No LMP recorded. Patient has had a hysterectomy.  menarche age 80, first live birth age 67. The patient is GX P2. She underwent hysterectomy with bilateral salpingo-oophorectomy in 1994, and has been on estrogen replacement since that time, discontinued June 2017.    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  She is divorced and currently lives next door to her daughter, Kristine Parrish, who works as a Manufacturing systems engineer for the [employer]. Son, Kristine Parrish lives in [place] where he is Investment banker, corporate. The patient has one granddaughter, 12 years old as of August 2019.  The patient attends [employer]  [employer]     ADVANCED DIRECTIVE In place. The patient's daughter Kristine Parrish is her healthcare power of attorney.    HEALTH MAINTENANCE: Social History   Tobacco Use   Smoking status: Never   Smokeless tobacco: Never  Substance Use Topics   Alcohol use: No   Drug use: Never     Colonoscopy: 2012/[facility]  PAP:  Bone density:  06/24/2016 shows a T score of -0.7 (normal)  Lipid panel:  No Known Allergies  Current Outpatient Medications  Medication Sig Dispense Refill   gabapentin (NEURONTIN) 800 MG tablet Take 800 mg by mouth 3 (three) times daily.     glipiZIDE (GLUCOTROL) 10 MG tablet Take 10 mg by mouth daily before breakfast.     lipase/protease/amylase (CREON) 12000-38000 units CPEP capsule      losartan (COZAAR) 25 MG tablet Take 1 tablet (25 mg total) by mouth daily. 30 tablet 6   lovastatin (MEVACOR) 20 MG tablet Take 20 mg by mouth at bedtime.     metFORMIN (GLUCOPHAGE) 500 MG tablet Take 500 mg by mouth 2 (two) times daily with a meal.      Multiple Vitamin (MULTIVITAMIN)  tablet Take 1 tablet by mouth daily.     mupirocin ointment (BACTROBAN) 2 % Apply 1 Application topically daily. 30 g 2   omeprazole (PRILOSEC) 20 MG capsule Take 20 mg by mouth daily.     pioglitazone (ACTOS) 15 MG tablet Take 15 mg by mouth daily.     Probiotic Product (PROBIOTIC DAILY PO) Take 1 tablet by mouth daily. Reported on 08/21/2015     traMADol (ULTRAM) 50 MG tablet Take 50 mg by mouth 3 (three) times daily as needed.     No current facility-administered medications for this visit.    OBJECTIVE: White woman who appears stated age Vitals:   11/13/22 1141  BP: (!) 154/83  Pulse: 81  Resp: 17  Temp: 98.1 F (36.7 C)  SpO2: 97%   Wt Readings from Last 3 Encounters:  11/13/22 169 lb 11.2 oz (77 kg)  07/29/22 169 lb (76.7 kg)  12/18/21 163 lb (73.9 kg)   Body mass index is 31.04 kg/m.    ECOG FS:2 - Symptomatic, <50% confined to bed No concerns for recurrence on breast exam.  No palpable LN Post op changes left breast, left axilla.  LAB RESULTS:  CMP     Component Value Date/Time   NA 139 11/12/2021 1135   NA 140 12/28/2016 1043   K 4.4 11/12/2021 1135   K 4.1 12/28/2016 1043   CL 103 11/12/2021 1135   CO2 31 11/12/2021 1135   CO2 26 12/28/2016 1043   GLUCOSE 157 (H) 11/12/2021 1135   GLUCOSE 173 (H) 12/28/2016 1043   BUN 13 11/12/2021 1135   BUN 16.9 12/28/2016 1043   CREATININE 0.74 11/12/2021 1135   CREATININE 0.9 12/28/2016 1043   CALCIUM 9.5 11/12/2021 1135   CALCIUM 9.3 12/28/2016 1043   PROT 7.0 11/12/2021 1135   PROT 7.0 12/28/2016 1043   ALBUMIN 4.1 11/12/2021 1135   ALBUMIN 3.6 12/28/2016 1043   AST 13 (L) 11/12/2021 1135   AST 17 12/28/2016 1043   ALT 11 11/12/2021 1135   ALT 17 12/28/2016 1043   ALKPHOS 122 11/12/2021 1135   ALKPHOS 158 (H) 12/28/2016 1043   BILITOT 0.4 11/12/2021 1135   BILITOT 0.36 12/28/2016 1043   GFRNONAA >60 11/12/2021 1135   GFRAA >60 09/21/2019 0931    INo results found for: "SPEP", "UPEP"  Lab Results  Component Value Date   WBC 6.1 11/12/2021   NEUTROABS 3.8 11/12/2021   HGB 12.1 11/12/2021   HCT 36.2 11/12/2021   MCV 92.3 11/12/2021   PLT 199 11/12/2021      Chemistry      Component Value Date/Time   NA 139 11/12/2021 1135   NA 140 12/28/2016 1043   K 4.4 11/12/2021 1135   K 4.1 12/28/2016 1043   CL 103 11/12/2021 1135   CO2 31 11/12/2021 1135   CO2 26 12/28/2016 1043   BUN 13 11/12/2021 1135   BUN 16.9 12/28/2016 1043   CREATININE 0.74 11/12/2021 1135   CREATININE 0.9 12/28/2016 1043      Component Value Date/Time   CALCIUM 9.5 11/12/2021 1135   CALCIUM 9.3 12/28/2016 1043   ALKPHOS 122 11/12/2021 1135   ALKPHOS 158 (H) 12/28/2016 1043   AST 13 (L) 11/12/2021 1135   AST 17 12/28/2016 1043   ALT 11 11/12/2021 1135   ALT 17 12/28/2016 1043   BILITOT 0.4 11/12/2021 1135   BILITOT 0.36 12/28/2016 1043     No results found for: "LABCA2"  No components found for:  "  [id number]"  No results for input(s): "INR" in the last 168 hours.  Urinalysis No results found for: "COLORURINE", "APPEARANCEUR", "LABSPEC", "PHURINE", "GLUCOSEU", "HGBUR", "BILIRUBINUR", "KETONESUR", "PROTEINUR", "UROBILINOGEN", "NITRITE", "LEUKOCYTESUR"   STUDIES: MM 3D SCREEN BREAST BILATERAL  Result Date: 11/11/2022 CLINICAL DATA:  Screening. EXAM: DIGITAL SCREENING BILATERAL MAMMOGRAM WITH TOMOSYNTHESIS AND CAD TECHNIQUE: Bilateral screening digital craniocaudal and mediolateral oblique mammograms were obtained. Bilateral screening digital breast tomosynthesis was performed. The images were evaluated with computer-aided detection. COMPARISON:  Previous exam(s). ACR Breast Density Category c: The breasts are heterogeneously dense, which may obscure small masses. FINDINGS: There are no findings suspicious for malignancy. IMPRESSION: No mammographic evidence of malignancy. A result letter of this screening mammogram will be mailed directly to the patient. RECOMMENDATION: Screening mammogram in one year. (Code:SM-B-01Y) BI-RADS CATEGORY  1: Negative. Electronically Signed   By: Frederico Hamman M.D.   On: 11/11/2022 15:44     ELIGIBLE FOR AVAILABLE RESEARCH PROTOCOL: Not eligible for PALLAS because HER-2 positive  ASSESSMENT: 77 y.o. [place] woman status post left breast upper outer quadrant and left axillary lymph node biopsy 08/13/2015 both positive for an invasive ductal carcinoma, grade 2 or 3, triple positive, with an MIB-1 of 50%  (1) neoadjuvant chemotherapy consisting of carboplatin, docetaxel, trastuzumab and pertuzumab given every 21 days Started 09/11/2015  (a) treatment changed to Abraxane and trastuzumab with cycle 2 because of side effects after cycle 1  (b) Abraxane discontinued after 3d dose (10/17/2015) because of peripheral neuropathy  (c) cyclophosphamide, methotrexate and fluorouracil (CMF) started 11/07/2015, 4 cycles, last dose 01/09/2016   (2) trastuzumab continued  to complete a year (last dose September 24, 2016)  (a) final echocardiogram 07/23/2016 showed a well-preserved ejection fraction at 55-60%  (3) status post left lumpectomy with targeted axillary dissection 02/14/2016 showing a residual pT1c pN1 invasive ductal carcinoma, grade 2, estrogen and progesterone receptor positive, now HER-2 not amplified; margins were negative  (a) left axillary seroma/abscess drained surgically 03/25/2016, but persists  (4) adjuvant radiation 05/19/16 - 06/29/16  Site/dose:    Left breast - 4 field             Left breast: 50 Gy in 25 fractions             SCLV/PAB: 45 Gy in 25 fractions Left breast boost: 10 GY in 5 fractions  (5) anastrozole started 07/07/2016 changed to tamoxifen due to consideration regarding arthralgias and myalgias  (a) bone density 06/24/2016 shows a T score of -0.7 (normal)  (b) density August 2021 shows a T score of -0.9 (normal)  (c) switched to tamoxifen June 2020  (6) Anemia related to decreased iron saturation and b12 deficiency  (a) Feraheme to be given x 2 starting on 08/04/2019  (b) b12 injections weekly x4 (interrupted slightly due to travel plans), then monthly   PLAN:  Patient is here for follow up She has now completed 5 yrs of anti estrogen therapy. No concerns for breast cancer recurrence. She is seeing a Dr Melida Quitter for melanoma follow up. She wants to return yearly for surveillance. Most recent mammogram no suspicion for malignancy.  Total time spent: 20 minutes. *Total Encounter Time as defined by the Centers for Medicare and Medicaid Services includes, in addition to the face-to-face time of a patient visit (documented in the note above) non-face-to-face time: obtaining and reviewing outside history, ordering and reviewing medications, tests or procedures, care coordination (communications with other health care professionals or caregivers) and documentation in the medical record.

## 2022-11-24 ENCOUNTER — Ambulatory Visit: Payer: PPO | Admitting: Podiatry

## 2022-11-24 DIAGNOSIS — L97522 Non-pressure chronic ulcer of other part of left foot with fat layer exposed: Secondary | ICD-10-CM

## 2022-11-24 NOTE — Progress Notes (Signed)
Subjective:  Patient ID: Kristine Parrish, female    DOB: [birth year],  MRN: [id number]  Chief Complaint  Patient presents with   Ulcer    Toe ulcer wound check pt stated that she is healed. Pt denies pain.    77 y.o. female presents with the above complaint. History confirmed with patient.    Objective:  Physical Exam: warm, good capillary refill, no trophic changes or ulcerative lesions, normal DP and PT pulses, and she has an abnormal sensory exam with diffuse neuropathy, bilateral pes planus deformity with collapse of the medial longitudinal arch, on the left foot she has reducible hammertoe deformities her ulcer has healed at this point     Radiographs: Multiple views x-ray of the left foot: New radiographs taken today show no change Assessment:   1. Toe ulcer, left, with fat layer exposed (HCC)      Plan:  Patient was evaluated and treated and all questions answered.  Ulcer left foot -Doing very well her wound has healed we discussed the possibility of recurrence and let me know ASAP if any of this develops if it recurs would recommend a flexor tenotomy to alleviate pressure on the toe  No follow-ups on file.

## 2022-12-07 DIAGNOSIS — M67962 Unspecified disorder of synovium and tendon, left lower leg: Secondary | ICD-10-CM | POA: Diagnosis not present

## 2022-12-17 DIAGNOSIS — G620 Drug-induced polyneuropathy: Secondary | ICD-10-CM | POA: Diagnosis not present

## 2022-12-17 DIAGNOSIS — K279 Peptic ulcer, site unspecified, unspecified as acute or chronic, without hemorrhage or perforation: Secondary | ICD-10-CM | POA: Diagnosis not present

## 2022-12-17 DIAGNOSIS — E1149 Type 2 diabetes mellitus with other diabetic neurological complication: Secondary | ICD-10-CM | POA: Diagnosis not present

## 2022-12-17 DIAGNOSIS — E7800 Pure hypercholesterolemia, unspecified: Secondary | ICD-10-CM | POA: Diagnosis not present

## 2022-12-17 DIAGNOSIS — I10 Essential (primary) hypertension: Secondary | ICD-10-CM | POA: Diagnosis not present

## 2022-12-17 DIAGNOSIS — Z9181 History of falling: Secondary | ICD-10-CM | POA: Diagnosis not present

## 2022-12-17 DIAGNOSIS — E11610 Type 2 diabetes mellitus with diabetic neuropathic arthropathy: Secondary | ICD-10-CM | POA: Diagnosis not present

## 2022-12-17 DIAGNOSIS — Z0000 Encounter for general adult medical examination without abnormal findings: Secondary | ICD-10-CM | POA: Diagnosis not present

## 2022-12-21 DIAGNOSIS — M25572 Pain in left ankle and joints of left foot: Secondary | ICD-10-CM | POA: Diagnosis not present

## 2022-12-25 DIAGNOSIS — M14671 Charcot's joint, right ankle and foot: Secondary | ICD-10-CM | POA: Diagnosis not present

## 2023-01-01 DIAGNOSIS — M14671 Charcot's joint, right ankle and foot: Secondary | ICD-10-CM | POA: Diagnosis not present

## 2023-01-01 DIAGNOSIS — M67962 Unspecified disorder of synovium and tendon, left lower leg: Secondary | ICD-10-CM | POA: Diagnosis not present

## 2023-01-14 DIAGNOSIS — L720 Epidermal cyst: Secondary | ICD-10-CM | POA: Diagnosis not present

## 2023-01-14 DIAGNOSIS — Z789 Other specified health status: Secondary | ICD-10-CM | POA: Diagnosis not present

## 2023-01-22 DIAGNOSIS — M67962 Unspecified disorder of synovium and tendon, left lower leg: Secondary | ICD-10-CM | POA: Diagnosis not present

## 2023-01-22 DIAGNOSIS — M14671 Charcot's joint, right ankle and foot: Secondary | ICD-10-CM | POA: Diagnosis not present

## 2023-02-01 DIAGNOSIS — W19XXXA Unspecified fall, initial encounter: Secondary | ICD-10-CM | POA: Diagnosis not present

## 2023-02-01 DIAGNOSIS — Z79899 Other long term (current) drug therapy: Secondary | ICD-10-CM | POA: Diagnosis not present

## 2023-02-01 DIAGNOSIS — M5416 Radiculopathy, lumbar region: Secondary | ICD-10-CM | POA: Diagnosis not present

## 2023-02-01 DIAGNOSIS — Z5181 Encounter for therapeutic drug level monitoring: Secondary | ICD-10-CM | POA: Diagnosis not present

## 2023-02-16 DIAGNOSIS — L728 Other follicular cysts of the skin and subcutaneous tissue: Secondary | ICD-10-CM | POA: Diagnosis not present

## 2023-02-16 DIAGNOSIS — L84 Corns and callosities: Secondary | ICD-10-CM | POA: Diagnosis not present

## 2023-02-17 DIAGNOSIS — M1712 Unilateral primary osteoarthritis, left knee: Secondary | ICD-10-CM | POA: Diagnosis not present

## 2023-02-22 DIAGNOSIS — M67962 Unspecified disorder of synovium and tendon, left lower leg: Secondary | ICD-10-CM | POA: Diagnosis not present

## 2023-02-22 DIAGNOSIS — M14671 Charcot's joint, right ankle and foot: Secondary | ICD-10-CM | POA: Diagnosis not present

## 2023-03-09 DIAGNOSIS — M21072 Valgus deformity, not elsewhere classified, left ankle: Secondary | ICD-10-CM | POA: Diagnosis not present

## 2023-03-09 DIAGNOSIS — M76822 Posterior tibial tendinitis, left leg: Secondary | ICD-10-CM | POA: Diagnosis not present

## 2023-05-17 DIAGNOSIS — H5213 Myopia, bilateral: Secondary | ICD-10-CM | POA: Diagnosis not present

## 2023-05-17 DIAGNOSIS — H52222 Regular astigmatism, left eye: Secondary | ICD-10-CM | POA: Diagnosis not present

## 2023-05-17 DIAGNOSIS — H18593 Other hereditary corneal dystrophies, bilateral: Secondary | ICD-10-CM | POA: Diagnosis not present

## 2023-05-17 DIAGNOSIS — H43813 Vitreous degeneration, bilateral: Secondary | ICD-10-CM | POA: Diagnosis not present

## 2023-05-17 DIAGNOSIS — E119 Type 2 diabetes mellitus without complications: Secondary | ICD-10-CM | POA: Diagnosis not present

## 2023-05-17 DIAGNOSIS — H53411 Scotoma involving central area, right eye: Secondary | ICD-10-CM | POA: Diagnosis not present

## 2023-05-17 DIAGNOSIS — H524 Presbyopia: Secondary | ICD-10-CM | POA: Diagnosis not present

## 2023-05-17 DIAGNOSIS — H353131 Nonexudative age-related macular degeneration, bilateral, early dry stage: Secondary | ICD-10-CM | POA: Diagnosis not present

## 2023-05-27 DIAGNOSIS — U071 COVID-19: Secondary | ICD-10-CM | POA: Diagnosis not present

## 2023-06-09 DIAGNOSIS — G620 Drug-induced polyneuropathy: Secondary | ICD-10-CM | POA: Diagnosis not present

## 2023-06-09 DIAGNOSIS — I10 Essential (primary) hypertension: Secondary | ICD-10-CM | POA: Diagnosis not present

## 2023-06-09 DIAGNOSIS — E11610 Type 2 diabetes mellitus with diabetic neuropathic arthropathy: Secondary | ICD-10-CM | POA: Diagnosis not present

## 2023-06-09 DIAGNOSIS — K279 Peptic ulcer, site unspecified, unspecified as acute or chronic, without hemorrhage or perforation: Secondary | ICD-10-CM | POA: Diagnosis not present

## 2023-06-09 DIAGNOSIS — E7800 Pure hypercholesterolemia, unspecified: Secondary | ICD-10-CM | POA: Diagnosis not present

## 2023-06-09 DIAGNOSIS — E1149 Type 2 diabetes mellitus with other diabetic neurological complication: Secondary | ICD-10-CM | POA: Diagnosis not present

## 2023-06-14 DIAGNOSIS — M545 Low back pain, unspecified: Secondary | ICD-10-CM | POA: Diagnosis not present

## 2023-06-14 DIAGNOSIS — W19XXXA Unspecified fall, initial encounter: Secondary | ICD-10-CM | POA: Diagnosis not present

## 2023-06-14 DIAGNOSIS — M5451 Vertebrogenic low back pain: Secondary | ICD-10-CM | POA: Diagnosis not present

## 2023-06-14 DIAGNOSIS — M25562 Pain in left knee: Secondary | ICD-10-CM | POA: Diagnosis not present

## 2023-06-14 DIAGNOSIS — M5416 Radiculopathy, lumbar region: Secondary | ICD-10-CM | POA: Diagnosis not present

## 2023-06-30 DIAGNOSIS — R94111 Abnormal electroretinogram [ERG]: Secondary | ICD-10-CM | POA: Diagnosis not present

## 2023-06-30 DIAGNOSIS — H53411 Scotoma involving central area, right eye: Secondary | ICD-10-CM | POA: Diagnosis not present

## 2023-06-30 DIAGNOSIS — H353131 Nonexudative age-related macular degeneration, bilateral, early dry stage: Secondary | ICD-10-CM | POA: Diagnosis not present

## 2023-07-27 DIAGNOSIS — K9089 Other intestinal malabsorption: Secondary | ICD-10-CM | POA: Diagnosis not present

## 2023-07-27 DIAGNOSIS — K8681 Exocrine pancreatic insufficiency: Secondary | ICD-10-CM | POA: Diagnosis not present

## 2023-07-27 DIAGNOSIS — K529 Noninfective gastroenteritis and colitis, unspecified: Secondary | ICD-10-CM | POA: Diagnosis not present

## 2023-07-27 DIAGNOSIS — Z9049 Acquired absence of other specified parts of digestive tract: Secondary | ICD-10-CM | POA: Diagnosis not present

## 2023-07-28 ENCOUNTER — Other Ambulatory Visit: Payer: Self-pay | Admitting: Gastroenterology

## 2023-07-28 DIAGNOSIS — K8681 Exocrine pancreatic insufficiency: Secondary | ICD-10-CM

## 2023-08-06 ENCOUNTER — Encounter: Payer: Self-pay | Admitting: Podiatry

## 2023-08-07 MED ORDER — CEPHALEXIN 500 MG PO CAPS: 500.0000 mg | ORAL_CAPSULE | Freq: Three times a day (TID) | ORAL | 0 refills | Status: DC

## 2023-08-12 ENCOUNTER — Other Ambulatory Visit: Payer: Self-pay | Admitting: Podiatry

## 2023-08-12 ENCOUNTER — Ambulatory Visit (INDEPENDENT_AMBULATORY_CARE_PROVIDER_SITE_OTHER)

## 2023-08-12 ENCOUNTER — Ambulatory Visit: Admitting: Podiatry

## 2023-08-12 DIAGNOSIS — L03032 Cellulitis of left toe: Secondary | ICD-10-CM

## 2023-08-12 DIAGNOSIS — L02612 Cutaneous abscess of left foot: Secondary | ICD-10-CM

## 2023-08-12 MED ORDER — GENTAMICIN SULFATE 0.1 % EX OINT: 1.0000 | TOPICAL_OINTMENT | Freq: Every day | CUTANEOUS | 0 refills | Status: DC

## 2023-08-12 NOTE — Progress Notes (Signed)
  Subjective:  Patient ID: Kristine Parrish, female    DOB: 19-Aug-1945,  MRN: [id number]  Chief Complaint  Patient presents with   Toe Pain    left 2nd toe medial wound is opening, not currently drainage, NIDDM A1C 7.5.    78 y.o. female presents with the above complaint. History confirmed with patient.  Returns for follow-up with recurrence of ulcer in a new location on the medial second toe.  She is currently taking the cephalexin  I prescribed for her  Objective:  Physical Exam: warm, good capillary refill, no trophic changes or ulcerative lesions, normal DP and PT pulses, and she has an abnormal sensory exam with diffuse neuropathy, bilateral pes planus deformity with collapse of the medial longitudinal arch, on the left foot she has reducible hammertoe deformities she has new ulceration on the medial second toe PIPJ full-thickness in nature with fibrogranular wound bed measures 10 x 6 x 3 mm with exposed subcutaneous tissue.  Mild periwound erythema no ascending cellulitis       Radiographs: Multiple views x-ray of the left foot: New radiographs taken today show no change Assessment:   1. Cellulitis and abscess of toe of left foot      Plan:  Patient was evaluated and treated and all questions answered.  Ulcer left foot - New ulceration on the left medial second toe.  This was debrided full-thickness sharply with a scalpel in excisional manner to remove overlying hyperkeratosis slough and nonviable tissue to a granular wound bed.  Gentamicin ointment prescribed apply daily continue cephalexin  until completed wound culture of the ulceration was taken today I will notify her if this needs to be changed to a different antibiotic for the next appointment.  Return in 2 weeks for follow-up  Return in about 2 weeks (around 08/26/2023) for wound care.

## 2023-08-15 LAB — WOUND CULTURE
MICRO NUMBER:: 16544057
SPECIMEN QUALITY:: ADEQUATE

## 2023-08-15 LAB — HOUSE ACCOUNT TRACKING

## 2023-08-19 ENCOUNTER — Other Ambulatory Visit

## 2023-08-19 ENCOUNTER — Ambulatory Visit
Admission: RE | Admit: 2023-08-19 | Discharge: 2023-08-19 | Disposition: A | Discharge status: Home / Self Care | Source: Ambulatory Visit | Attending: Gastroenterology | Admitting: Gastroenterology

## 2023-08-19 DIAGNOSIS — R197 Diarrhea, unspecified: Secondary | ICD-10-CM | POA: Diagnosis not present

## 2023-08-19 DIAGNOSIS — K8681 Exocrine pancreatic insufficiency: Secondary | ICD-10-CM

## 2023-08-19 DIAGNOSIS — R1010 Upper abdominal pain, unspecified: Secondary | ICD-10-CM | POA: Diagnosis not present

## 2023-08-19 MED ORDER — IOPAMIDOL (ISOVUE-300) INJECTION 61%
100.0000 mL | Freq: Once | INTRAVENOUS | Status: AC | PRN
  Administered 2023-08-19: 100 mL via INTRAVENOUS

## 2023-08-26 ENCOUNTER — Encounter: Payer: Self-pay | Admitting: Podiatry

## 2023-08-26 ENCOUNTER — Ambulatory Visit: Admitting: Podiatry

## 2023-08-26 VITALS — Ht 62.0 in | Wt 169.7 lb

## 2023-08-26 DIAGNOSIS — L97522 Non-pressure chronic ulcer of other part of left foot with fat layer exposed: Secondary | ICD-10-CM | POA: Diagnosis not present

## 2023-08-26 NOTE — Progress Notes (Signed)
  Subjective:  Patient ID: Kristine Parrish, female    DOB: 1945/10/10,  MRN: [id number]  Chief Complaint  Patient presents with   Wound Check    Pt is here to f/u on wound on left foot second toe pt states she thinks its healing well and she makes sure she keeps it clean dry and wrap up.    78 y.o. female presents with the above complaint. History confirmed with patient.  She notes improvement she has completed her antibiotics  Objective:  Physical Exam: warm, good capillary refill, no trophic changes or ulcerative lesions, normal DP and PT pulses, and she has an abnormal sensory exam with diffuse neuropathy, bilateral pes planus deformity with collapse of the medial longitudinal arch, on the left foot she has reducible hammertoe deformities she has ulceration on the medial second toe PIPJ full-thickness in nature with fibrogranular wound bed measures 8 x 5 x 3 mm with exposed subcutaneous tissue.  Cellulitis resolved      Staph aureus sensitive to oxacillin and culture at last visit  Radiographs: Multiple views x-ray of the left foot: New radiographs taken today show no change Assessment:   1. Toe ulcer, left, with fat layer exposed (HCC)      Plan:  Patient was evaluated and treated and all questions answered.  Ulcer left foot - Improving.  The ulcer was debrided full-thickness sharply with a scalpel in excisional manner to remove overlying hyperkeratosis slough and nonviable tissue to a granular wound bed.  Postoperative measurements are noted above.  Switch to Prisma collagen dressings.  This was applied today and I reviewed its use with her.  Change every 48-72 hours. Return in about 3 weeks (around 09/16/2023) for wound care.

## 2023-09-01 DIAGNOSIS — Z711 Person with feared health complaint in whom no diagnosis is made: Secondary | ICD-10-CM | POA: Diagnosis not present

## 2023-09-16 ENCOUNTER — Ambulatory Visit: Admitting: Podiatry

## 2023-09-16 DIAGNOSIS — L97522 Non-pressure chronic ulcer of other part of left foot with fat layer exposed: Secondary | ICD-10-CM

## 2023-09-19 ENCOUNTER — Encounter: Payer: Self-pay | Admitting: Podiatry

## 2023-09-19 MED ORDER — CEPHALEXIN 500 MG PO CAPS: 500.0000 mg | ORAL_CAPSULE | Freq: Three times a day (TID) | ORAL | 0 refills | Status: AC

## 2023-09-19 MED ORDER — GENTAMICIN SULFATE 0.1 % EX OINT: 1.0000 | TOPICAL_OINTMENT | Freq: Every day | CUTANEOUS | 0 refills | Status: AC

## 2023-09-19 NOTE — Progress Notes (Signed)
  Subjective:  Patient ID: Kristine Parrish, female    DOB: Jan 18, 1946,  MRN: [id number]  No chief complaint on file.   78 y.o. female presents with the above complaint. History confirmed with patient.     Objective:  Physical Exam: warm, good capillary refill, no trophic changes or ulcerative lesions, normal DP and PT pulses, and she has an abnormal sensory exam with diffuse neuropathy, bilateral pes planus deformity with collapse of the medial longitudinal arch, on the left foot she has reducible hammertoe deformities she has ulceration on the medial second toe PIPJ full-thickness in nature with fibrogranular wound bed measures 6 x 2 x 2 mm with exposed subcutaneous tissue.  Cellulitis resolved      Staph aureus sensitive to oxacillin and culture at last visit  Radiographs: Multiple views x-ray of the left foot: New radiographs taken today show no change Assessment:   1. Toe ulcer, left, with fat layer exposed (HCC)      Plan:  Patient was evaluated and treated and all questions answered.  Ulcer left foot - Improving.  Should not need further Prisma.  Refill gentamicin  ointment sent to pharmacy use until this heals.  She does not have some erythema as well I recommend another round of cephalexin .  This was sent to pharmacy.  Follow-up in 3 weeks to reevaluate  Return in about 3 weeks (around 10/07/2023) for wound care.

## 2023-09-20 DIAGNOSIS — D225 Melanocytic nevi of trunk: Secondary | ICD-10-CM | POA: Diagnosis not present

## 2023-09-20 DIAGNOSIS — Z85820 Personal history of malignant melanoma of skin: Secondary | ICD-10-CM | POA: Diagnosis not present

## 2023-09-20 DIAGNOSIS — L821 Other seborrheic keratosis: Secondary | ICD-10-CM | POA: Diagnosis not present

## 2023-09-20 DIAGNOSIS — Z08 Encounter for follow-up examination after completed treatment for malignant neoplasm: Secondary | ICD-10-CM | POA: Diagnosis not present

## 2023-09-20 DIAGNOSIS — L814 Other melanin hyperpigmentation: Secondary | ICD-10-CM | POA: Diagnosis not present

## 2023-10-07 ENCOUNTER — Ambulatory Visit: Admitting: Podiatry

## 2023-10-11 DIAGNOSIS — M5451 Vertebrogenic low back pain: Secondary | ICD-10-CM | POA: Diagnosis not present

## 2023-10-11 DIAGNOSIS — M5416 Radiculopathy, lumbar region: Secondary | ICD-10-CM | POA: Diagnosis not present

## 2023-10-11 DIAGNOSIS — Z5181 Encounter for therapeutic drug level monitoring: Secondary | ICD-10-CM | POA: Diagnosis not present

## 2023-10-12 ENCOUNTER — Ambulatory Visit: Admitting: Podiatry

## 2023-10-12 ENCOUNTER — Encounter: Payer: Self-pay | Admitting: Podiatry

## 2023-10-12 DIAGNOSIS — E11621 Type 2 diabetes mellitus with foot ulcer: Secondary | ICD-10-CM

## 2023-10-12 DIAGNOSIS — L97521 Non-pressure chronic ulcer of other part of left foot limited to breakdown of skin: Secondary | ICD-10-CM

## 2023-10-15 ENCOUNTER — Encounter: Payer: Self-pay | Admitting: Podiatry

## 2023-10-15 NOTE — Progress Notes (Signed)
  Subjective:  Patient ID: Kristine Parrish, female    DOB: Jun 26, 1945,  MRN: [id number]  Chief Complaint  Patient presents with   Wound Check    Pt is here for wound check of L second toe.   She feels like it is healing, but slowly. Is not feeling any pain     78 y.o. female presents with the above complaint. History confirmed with patient.     Objective:  Physical Exam: warm, good capillary refill, no trophic changes or ulcerative lesions, normal DP and PT pulses, and she has an abnormal sensory exam with diffuse neuropathy, bilateral pes planus deformity with collapse of the medial longitudinal arch, on the left foot she has reducible hammertoe deformities she has ulceration on the medial second toe PIPJ partial-thickness in nature nearly fully healed      Staph aureus sensitive to oxacillin and culture at last visit  Radiographs: Multiple views x-ray of the left foot: New radiographs taken today show no change Assessment:   1. Diabetic ulcer of toe of left foot associated with type 2 diabetes mellitus, limited to breakdown of skin (HCC)      Plan:  Patient was evaluated and treated and all questions answered.  Ulcer left foot - Continues to do well and is nearly fully healed.  Continue local wound care and bandaging until next visit.  Expect should be healed at that point.  Return in about 3 weeks (around 11/02/2023) for wound care.

## 2023-11-02 ENCOUNTER — Encounter: Payer: Self-pay | Admitting: Podiatry

## 2023-11-02 ENCOUNTER — Ambulatory Visit: Admitting: Podiatry

## 2023-11-02 DIAGNOSIS — E11621 Type 2 diabetes mellitus with foot ulcer: Secondary | ICD-10-CM | POA: Diagnosis not present

## 2023-11-02 DIAGNOSIS — L97521 Non-pressure chronic ulcer of other part of left foot limited to breakdown of skin: Secondary | ICD-10-CM

## 2023-11-02 NOTE — Patient Instructions (Signed)
 More silicone pads can be purchased from:  [email or web address]

## 2023-11-05 NOTE — Progress Notes (Signed)
  Subjective:  Patient ID: Kristine Parrish, female    DOB: 1945-03-13,  MRN: [id number]  Chief Complaint  Patient presents with   Foot Ulcer    Wound check for L 2nd medial toe ulcer. Some swelling and redness. No drainage.  Diabetic A1c 7.5.  no anti coag    78 y.o. female presents with the above complaint. History confirmed with patient.     Objective:  Physical Exam: warm, good capillary refill, no trophic changes or ulcerative lesions, normal DP and PT pulses, and she has an abnormal sensory exam with diffuse neuropathy, bilateral pes planus deformity with collapse of the medial longitudinal arch, on the left foot she has reducible hammertoe deformities, her previous ulcer has fully healed      Staph aureus sensitive to oxacillin and culture at last visit  Radiographs: Multiple views x-ray of the left foot: New radiographs taken today show no change Assessment:   1. Diabetic ulcer of toe of left foot associated with type 2 diabetes mellitus, limited to breakdown of skin (HCC)      Plan:  Patient was evaluated and treated and all questions answered.  Ulcer left foot - Wound has fully healed.  May leave open to air.  Monitor closely for signs of recurrence and return to see me as needed for this.  We also discussed her significant pes planus deformity.  She has been utilizing a double upright brace which has eliminated pain but has had some difficulty.  She questions about the option of surgical reconstruction which she has discussed with Dr. Kit before.  I encouraged her to schedule follow-up with him to review options again.  Return if symptoms worsen or fail to improve.

## 2023-11-06 ENCOUNTER — Other Ambulatory Visit: Payer: Self-pay | Admitting: Medical Genetics

## 2023-11-12 ENCOUNTER — Ambulatory Visit
Admission: RE | Admit: 2023-11-12 | Discharge: 2023-11-12 | Disposition: A | Discharge status: Home / Self Care | Source: Ambulatory Visit | Attending: Hematology and Oncology | Admitting: Hematology and Oncology

## 2023-11-12 DIAGNOSIS — Z170 Estrogen receptor positive status [ER+]: Secondary | ICD-10-CM

## 2023-11-12 DIAGNOSIS — Z1231 Encounter for screening mammogram for malignant neoplasm of breast: Secondary | ICD-10-CM | POA: Diagnosis not present

## 2023-11-18 ENCOUNTER — Other Ambulatory Visit

## 2023-11-18 DIAGNOSIS — Z006 Encounter for examination for normal comparison and control in clinical research program: Secondary | ICD-10-CM

## 2023-11-26 LAB — GENECONNECT MOLECULAR SCREEN: Genetic Analysis Overall Interpretation: NEGATIVE

## 2023-12-13 DIAGNOSIS — H18593 Other hereditary corneal dystrophies, bilateral: Secondary | ICD-10-CM | POA: Diagnosis not present

## 2023-12-13 DIAGNOSIS — E119 Type 2 diabetes mellitus without complications: Secondary | ICD-10-CM | POA: Diagnosis not present

## 2023-12-13 DIAGNOSIS — H353131 Nonexudative age-related macular degeneration, bilateral, early dry stage: Secondary | ICD-10-CM | POA: Diagnosis not present

## 2023-12-14 ENCOUNTER — Telehealth: Payer: Self-pay

## 2023-12-14 NOTE — Telephone Encounter (Signed)
 Spoke with patient and confirmed appointment on 10/8

## 2023-12-15 ENCOUNTER — Inpatient Hospital Stay: Attending: Hematology and Oncology | Admitting: Hematology and Oncology

## 2023-12-15 ENCOUNTER — Encounter: Payer: Self-pay | Admitting: Hematology and Oncology

## 2023-12-15 VITALS — BP 145/65 | HR 91 | Temp 97.8°F | Resp 17 | Wt 167.3 lb

## 2023-12-15 DIAGNOSIS — Z170 Estrogen receptor positive status [ER+]: Secondary | ICD-10-CM | POA: Insufficient documentation

## 2023-12-15 DIAGNOSIS — C50412 Malignant neoplasm of upper-outer quadrant of left female breast: Secondary | ICD-10-CM

## 2023-12-15 DIAGNOSIS — D649 Anemia, unspecified: Secondary | ICD-10-CM | POA: Diagnosis not present

## 2023-12-15 DIAGNOSIS — Z79810 Long term (current) use of selective estrogen receptor modulators (SERMs): Secondary | ICD-10-CM | POA: Diagnosis not present

## 2023-12-15 DIAGNOSIS — C50411 Malignant neoplasm of upper-outer quadrant of right female breast: Secondary | ICD-10-CM | POA: Insufficient documentation

## 2023-12-15 DIAGNOSIS — R197 Diarrhea, unspecified: Secondary | ICD-10-CM | POA: Insufficient documentation

## 2023-12-15 DIAGNOSIS — E1165 Type 2 diabetes mellitus with hyperglycemia: Secondary | ICD-10-CM | POA: Insufficient documentation

## 2023-12-15 DIAGNOSIS — K8681 Exocrine pancreatic insufficiency: Secondary | ICD-10-CM | POA: Diagnosis not present

## 2023-12-15 DIAGNOSIS — E538 Deficiency of other specified B group vitamins: Secondary | ICD-10-CM | POA: Diagnosis not present

## 2023-12-15 NOTE — Progress Notes (Signed)
 [facility]  [phone] [phone]     ID: JAVAEH MUSCATELLO DOB: 06/19/1945  MR#: [id number]  RDW#:[id number]  Patient Care Team: Nanci Senior, MD as PCP - General (Family Medicine) Curvin Deward MOULD, MD as Consulting Physician (General Surgery) Izell Domino, MD as Attending Physician (Radiation Oncology) Lorie Redell RAMAN, MD (Dermatology) Celestia Agent, MD (Inactive) as Consulting Physician (Gastroenterology) Zan Factor, DPM as Consulting Physician (Podiatry) Rosalynn LELON Ingle, MD (Inactive) as Consulting Physician (Obstetrics and Gynecology) Rolan Ezra RAMAN, MD as Consulting Physician (Cardiology) Kit Rush, MD as Consulting Physician (Orthopedic Surgery) Neda Jennet LABOR, MD as Consulting Physician (Pulmonary Disease) Serene Gaile LELON, MD as Consulting Physician (Vascular Surgery) OTHER MD:  CHIEF COMPLAINT: Estrogen receptor positive breast cancer/iron and b12 deficiency  CURRENT TREATMENT: observation  INTERVAL HISTORY:  Discussed the use of AI scribe software for clinical note transcription with the patient, who gave verbal consent to proceed.  History of Present Illness ANTWAN BRIBIESCA is a 78 year old female with a history of melanoma and breast cancer who presents for a follow-up visit.  She has been undergoing regular dermatological scans every six months for melanoma surveillance, which have been normal so far. Her last mammogram was also normal.   She has a history of breast cancer diagnosed in 2017, which was triple positive, HER2 positive.   She has a history of iron deficiency anemia, which has resolved. She was previously on B12 injections but has stopped them as her levels became too high. She now takes oral supplements as needed.  She has a completely severed tendon in her foot, which requires surgery. Her diabetes management is a concern as her A1c increased from 6.6 to 7.5 after a medication change. She is using a  brace for support and is awaiting better diabetes control before surgery can be considered. She experiences frequent foot ulcers that take several months to heal, requiring regular podiatrist visits.  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 She was on Creon, which was expensive and not effective, and is now on colestipol to manage symptoms. She experiences diarrhea after almost every meal and is concerned about malnutrition due to poor nutrient absorption.  She reports occasional cough attributed to seasonal changes.  A detailed review of systems was otherwise stable.   COVID 19 VACCINATION STATUS: Status post Pfizer x2   BREAST CANCER HISTORY: From the original intake note  Elienai herself noted a change in her left breast and brought it to the attention of Dr. Rosalynn, who set her up for bilateral diagnostic mammography with tomography and [facility]. The breast density was category D. In the area of palpable concern there was a mass with irregular margins and architectural distortion, measuring approximately 3 cm. This was palpable in the upper outer quadrant near the nipple. The ultrasound confirmed an irregular hypoechoic mass approximately 3 cm from the nipple at the 10:00 position measuring 2.7 cm. Ultrasound of the left axilla found a single level I left axillary node with focal cortical thickening.  On 08/13/2015 the patient underwent biopsy of the left breast mass and the suspicious left axillary lymph node, both showing invasive ductal carcinoma, grade 2 or 3, estrogen receptor 100% positive, progesterone receptor 100% positive, both with strong staining intensity, with an MIB-1 of 50%, and HER-2 amplification, the signals ratio being 2.2 to and the number per cell 3.55  The patient's subsequent history is  as detailed below   PAST MEDICAL HISTORY: Past Medical History:  Diagnosis Date    Arthritis    hands    Breast cancer of upper-outer quadrant of left female breast (HCC) 08/15/2015   Colon polyps    Dyspnea    with exertion- from chemo   GERD (gastroesophageal reflux disease)    Hiatal hernia    History of bronchitis    History of radiation therapy 05/19/16- 06/29/16   Left Breast 50 Gy in 25 fractions, SCLV/PAB 45 Gy in 25 fractions, Left Breast boost 10 Gy in 5 fractions.    Hypertension    Neuropathy    from chemopathy   Peptic ulcer disease    Peripheral neuropathy    Personal history of chemotherapy    Personal history of radiation therapy    Type 2 diabetes mellitus (HCC)    Type II    PAST SURGICAL HISTORY: Past Surgical History:  Procedure Laterality Date   APPENDECTOMY  1974   BREAST LUMPECTOMY Left 02/14/2016   cataract surgery Bilateral 04/2012   CHOLECYSTECTOMY  1996   COLONOSCOPY     FOOT SURGERY     left bunion removed   nerve removed from left foot  2000   PORTACATH PLACEMENT N/A 09/05/2015   Procedure: INSERTION PORT-A-CATH;  Surgeon: Deward Null III, MD;  Location: [facility] OR;  Service: General;  Laterality: N/A;   RADIOACTIVE SEED GUIDED PARTIAL MASTECTOMY/AXILLARY SENTINEL NODE BIOPSY/AXILLARY NODE DISSECTION Left 02/14/2016   Procedure: LEFT BREAST RADIOACTIVE SEED GUIDED LUMPECTOMY WITH LEFT  SENTINEL LYMPH NODE BIOPSY AND LEFT  SEED TARGETED DISSECTION;  Surgeon: Deward Null III, MD;  Location: [facility] OR;  Service: General;  Laterality: Left;   TUBAL LIGATION     VAGINAL HYSTERECTOMY  1995    FAMILY HISTORY Family History  Problem Relation Age of Onset   Diabetes Mother    Hypertension Mother    Congestive Heart Failure Maternal Grandmother    Alcoholism Maternal Grandfather    Breast cancer Neg Hx   The patient has little information about her father and is not sure of his cause of death or age at death. The patient's mother died at age 52. The patient had no siblings. She was raised by her grandmother. She is not aware of any breast or  ovarian cancer history in the family    GYNECOLOGIC HISTORY:  No LMP recorded. Patient has had a hysterectomy.  menarche age 89, first live birth age 20. The patient is GX P2. She underwent hysterectomy with bilateral salpingo-oophorectomy in 1994, and has been on estrogen replacement since that time, discontinued June 2017.    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  She is divorced and currently lives next door to her daughter, Blane Mulberry, who works as a Manufacturing systems engineer for the [employer]. Son, Sakari Raisanen lives in [place]  where he is Investment banker, corporate. The patient has one granddaughter, 26 years old as of August 2019.  The patient attends [employer]     ADVANCED DIRECTIVE In place. The patient's daughter Blane is her healthcare power of attorney.    HEALTH MAINTENANCE: Social History   Tobacco Use   Smoking status: Never   Smokeless tobacco: Never  Substance Use Topics   Alcohol use: No   Drug use: Never     Colonoscopy: 2012/[facility]  PAP:  Bone density:  06/24/2016 shows a T score of -0.7 (  normal)  Lipid panel:  No Known Allergies  Current Outpatient Medications  Medication Sig Dispense Refill   cephALEXin  (KEFLEX ) 500 MG capsule Take 1 capsule (500 mg total) by mouth 3 (three) times daily. 30 capsule 0   gabapentin  (NEURONTIN ) 800 MG tablet Take 800 mg by mouth 3 (three) times daily.     gentamicin  ointment (GARAMYCIN ) 0.1 % Apply 1 Application topically daily. Apply to wound daily 30 g 0   glipiZIDE (GLUCOTROL) 10 MG tablet Take 10 mg by mouth daily before breakfast.     lipase/protease/amylase (CREON) 12000-38000 units CPEP capsule      losartan  (COZAAR ) 25 MG tablet Take 1 tablet (25 mg total) by mouth daily. 30 tablet 6   lovastatin (MEVACOR) 20 MG tablet Take 20 mg by mouth at bedtime.     metFORMIN (GLUCOPHAGE) 500 MG tablet Take 500 mg by mouth 2 (two)  times daily with a meal.      Multiple Vitamin (MULTIVITAMIN) tablet Take 1 tablet by mouth daily.     mupirocin  ointment (BACTROBAN ) 2 % Apply 1 Application topically daily. 30 g 2   omeprazole (PRILOSEC) 20 MG capsule Take 20 mg by mouth daily.     pioglitazone (ACTOS) 15 MG tablet Take 15 mg by mouth daily.     Probiotic Product (PROBIOTIC DAILY PO) Take 1 tablet by mouth daily. Reported on 08/21/2015     traMADol  (ULTRAM ) 50 MG tablet Take 50 mg by mouth 3 (three) times daily as needed.     No current facility-administered medications for this visit.    OBJECTIVE: White woman who appears stated age Vitals:   12/15/23 1525  BP: (!) 145/65  Pulse: 91  Resp: 17  Temp: 97.8 F (36.6 C)  SpO2: 100%   Wt Readings from Last 3 Encounters:  12/15/23 167 lb 4.8 oz (75.9 kg)  08/26/23 169 lb 11.2 oz (77 kg)  11/13/22 169 lb 11.2 oz (77 kg)   Body mass index is 30.6 kg/m.    ECOG FS:2 - Symptomatic, <50% confined to bed No concerns for recurrence on breast exam. No palpable LN Post op changes left breast, left axilla. CTA bilaterally Left foot in brace.  LAB RESULTS:  CMP     Component Value Date/Time   NA 139 11/12/2021 1135   NA 140 12/28/2016 1043   K 4.4 11/12/2021 1135   K 4.1 12/28/2016 1043   CL 103 11/12/2021 1135   CO2 31 11/12/2021 1135   CO2 26 12/28/2016 1043   GLUCOSE 157 (H) 11/12/2021 1135   GLUCOSE 173 (H) 12/28/2016 1043   BUN 13 11/12/2021 1135   BUN 16.9 12/28/2016 1043   CREATININE 0.74 11/12/2021 1135   CREATININE 0.9 12/28/2016 1043   CALCIUM 9.5 11/12/2021 1135   CALCIUM 9.3 12/28/2016 1043   PROT 7.0 11/12/2021 1135   PROT 7.0 12/28/2016 1043   ALBUMIN 4.1 11/12/2021 1135   ALBUMIN 3.6 12/28/2016 1043   AST 13 (L) 11/12/2021 1135   AST 17 12/28/2016 1043   ALT 11 11/12/2021 1135   ALT 17 12/28/2016 1043   ALKPHOS 122 11/12/2021 1135   ALKPHOS 158 (H) 12/28/2016 1043   BILITOT 0.4 11/12/2021 1135   BILITOT 0.36 12/28/2016 1043    GFRNONAA >60 11/12/2021 1135   GFRAA >60 09/21/2019 0931    INo results found for: SPEP, UPEP  Lab Results  Component Value Date   WBC 6.1 11/12/2021   NEUTROABS 3.8 11/12/2021   HGB 12.1 11/12/2021  HCT 36.2 11/12/2021   MCV 92.3 11/12/2021   PLT 199 11/12/2021      Chemistry      Component Value Date/Time   NA 139 11/12/2021 1135   NA 140 12/28/2016 1043   K 4.4 11/12/2021 1135   K 4.1 12/28/2016 1043   CL 103 11/12/2021 1135   CO2 31 11/12/2021 1135   CO2 26 12/28/2016 1043   BUN 13 11/12/2021 1135   BUN 16.9 12/28/2016 1043   CREATININE 0.74 11/12/2021 1135   CREATININE 0.9 12/28/2016 1043      Component Value Date/Time   CALCIUM 9.5 11/12/2021 1135   CALCIUM 9.3 12/28/2016 1043   ALKPHOS 122 11/12/2021 1135   ALKPHOS 158 (H) 12/28/2016 1043   AST 13 (L) 11/12/2021 1135   AST 17 12/28/2016 1043   ALT 11 11/12/2021 1135   ALT 17 12/28/2016 1043   BILITOT 0.4 11/12/2021 1135   BILITOT 0.36 12/28/2016 1043     No results found for: LABCA2  No components found for: LABCA125  No results for input(s): INR in the last 168 hours.  Urinalysis No results found for: COLORURINE, APPEARANCEUR, LABSPEC, PHURINE, GLUCOSEU, HGBUR, BILIRUBINUR, KETONESUR, PROTEINUR, UROBILINOGEN, NITRITE, LEUKOCYTESUR   STUDIES: No results found.   ELIGIBLE FOR AVAILABLE RESEARCH PROTOCOL: Not eligible for PALLAS because HER-2 positive  ASSESSMENT: 78 y.o. [place] woman status post left breast upper outer quadrant and left axillary lymph node biopsy 08/13/2015 both positive for an invasive ductal carcinoma, grade 2 or 3, triple positive, with an MIB-1 of 50%  (1) neoadjuvant chemotherapy consisting of carboplatin , docetaxel , trastuzumab  and pertuzumab  given every 21 days Started 09/11/2015  (a) treatment changed to Abraxane  and trastuzumab  with cycle 2 because of side effects after cycle 1  (b) Abraxane  discontinued after 3d dose (10/17/2015)  because of peripheral neuropathy  (c) cyclophosphamide , methotrexate  and fluorouracil  (CMF) started 11/07/2015, 4 cycles, last dose 01/09/2016   (2) trastuzumab  continued to complete a year (last dose September 24, 2016)  (a) final echocardiogram 07/23/2016 showed a well-preserved ejection fraction at 55-60%  (3) status post left lumpectomy with targeted axillary dissection 02/14/2016 showing a residual pT1c pN1 invasive ductal carcinoma, grade 2, estrogen and progesterone receptor positive, now HER-2 not amplified; margins were negative  (a) left axillary seroma/abscess drained surgically 03/25/2016, but persists  (4) adjuvant radiation 05/19/16 - 06/29/16  Site/dose:    Left breast - 4 field             Left breast: 50 Gy in 25 fractions             SCLV/PAB: 45 Gy in 25 fractions Left breast boost: 10 GY in 5 fractions  (5) anastrozole  started 07/07/2016 changed to tamoxifen  due to consideration regarding arthralgias and myalgias  (a) bone density 06/24/2016 shows a T score of -0.7 (normal)  (b) density August 2021 shows a T score of -0.9 (normal)  (c) switched to tamoxifen  June 2020  (6) Anemia related to decreased iron saturation and b12 deficiency  (a) Feraheme  to be given x 2 starting on 08/04/2019  (b) b12 injections weekly x4 (interrupted slightly due to travel plans), then monthly   PLAN:  Assessment and Plan Assessment & Plan Triple positive (HER2+, ER+, PR+) breast cancer 8 years post-diagnosis with no recurrence. Low recurrence risk. Discussed insurance coverage concerns for diagnostic mammograms beyond five years post-diagnosis. - Order screening mammogram for next year. - Advise to monitor for persistent symptoms such as unexplained cough, bone pain, double vision, seizures,  or constant headaches and report if she occurs.  Type 2 diabetes mellitus, poorly controlled Poorly controlled diabetes with hemoglobin A1c increased from 6.6 to 7.5. Emphasized importance of diabetes  control for surgical outcomes. - Continue to work with primary care provider to titrate diabetes medication and bring hemoglobin A1c below 7. - Plan for surgery on the foot once diabetes is better controlled.  Chronic exocrine pancreatic insufficiency with diarrhea Chronic exocrine pancreatic insufficiency causing diarrhea and potential malabsorption. Discussed importance of pancreatic enzymes for nutrient absorption.   Total time spent: 20 minutes. *Total Encounter Time as defined by the Centers for Medicare and Medicaid Services includes, in addition to the face-to-face time of a patient visit (documented in the note above) non-face-to-face time: obtaining and reviewing outside history, ordering and reviewing medications, tests or procedures, care coordination (communications with other health care professionals or caregivers) and documentation in the medical record.

## 2023-12-29 DIAGNOSIS — Z9181 History of falling: Secondary | ICD-10-CM | POA: Diagnosis not present

## 2023-12-29 DIAGNOSIS — E1149 Type 2 diabetes mellitus with other diabetic neurological complication: Secondary | ICD-10-CM | POA: Diagnosis not present

## 2023-12-29 DIAGNOSIS — K279 Peptic ulcer, site unspecified, unspecified as acute or chronic, without hemorrhage or perforation: Secondary | ICD-10-CM | POA: Diagnosis not present

## 2023-12-29 DIAGNOSIS — I10 Essential (primary) hypertension: Secondary | ICD-10-CM | POA: Diagnosis not present

## 2023-12-29 DIAGNOSIS — Z0000 Encounter for general adult medical examination without abnormal findings: Secondary | ICD-10-CM | POA: Diagnosis not present

## 2023-12-29 DIAGNOSIS — Z1331 Encounter for screening for depression: Secondary | ICD-10-CM | POA: Diagnosis not present

## 2023-12-29 DIAGNOSIS — Z6828 Body mass index (BMI) 28.0-28.9, adult: Secondary | ICD-10-CM | POA: Diagnosis not present

## 2023-12-29 DIAGNOSIS — E7800 Pure hypercholesterolemia, unspecified: Secondary | ICD-10-CM | POA: Diagnosis not present

## 2023-12-29 DIAGNOSIS — G620 Drug-induced polyneuropathy: Secondary | ICD-10-CM | POA: Diagnosis not present

## 2023-12-29 DIAGNOSIS — K529 Noninfective gastroenteritis and colitis, unspecified: Secondary | ICD-10-CM | POA: Diagnosis not present

## 2023-12-29 DIAGNOSIS — E11610 Type 2 diabetes mellitus with diabetic neuropathic arthropathy: Secondary | ICD-10-CM | POA: Diagnosis not present

## 2024-01-12 DIAGNOSIS — M14672 Charcot's joint, left ankle and foot: Secondary | ICD-10-CM | POA: Diagnosis not present

## 2024-01-12 DIAGNOSIS — M14671 Charcot's joint, right ankle and foot: Secondary | ICD-10-CM | POA: Diagnosis not present

## 2024-01-13 ENCOUNTER — Other Ambulatory Visit: Payer: Self-pay | Admitting: Orthopedic Surgery

## 2024-01-13 DIAGNOSIS — M67962 Unspecified disorder of synovium and tendon, left lower leg: Secondary | ICD-10-CM

## 2024-01-17 ENCOUNTER — Other Ambulatory Visit

## 2024-01-17 ENCOUNTER — Ambulatory Visit
Admission: RE | Admit: 2024-01-17 | Discharge: 2024-01-17 | Disposition: A | Discharge status: Home / Self Care | Source: Ambulatory Visit | Attending: Orthopedic Surgery | Admitting: Orthopedic Surgery

## 2024-01-17 DIAGNOSIS — M67874 Other specified disorders of tendon, left ankle and foot: Secondary | ICD-10-CM | POA: Diagnosis not present

## 2024-01-17 DIAGNOSIS — M67962 Unspecified disorder of synovium and tendon, left lower leg: Secondary | ICD-10-CM

## 2024-02-10 DIAGNOSIS — M25572 Pain in left ankle and joints of left foot: Secondary | ICD-10-CM | POA: Diagnosis not present

## 2024-02-10 DIAGNOSIS — M5416 Radiculopathy, lumbar region: Secondary | ICD-10-CM | POA: Diagnosis not present

## 2024-02-18 DIAGNOSIS — G8929 Other chronic pain: Secondary | ICD-10-CM | POA: Diagnosis not present

## 2024-02-18 DIAGNOSIS — M14671 Charcot's joint, right ankle and foot: Secondary | ICD-10-CM | POA: Diagnosis not present

## 2024-02-18 DIAGNOSIS — M79671 Pain in right foot: Secondary | ICD-10-CM | POA: Diagnosis not present

## 2024-12-15 ENCOUNTER — Ambulatory Visit: Admitting: Hematology and Oncology
